# Patient Record
Sex: Female | Born: 1945
Health system: Southern US, Community
[De-identification: ages and names within clinical notes are randomized; demographics above are authoritative.]

## PROBLEM LIST (undated history)

## (undated) DIAGNOSIS — M1990 Unspecified osteoarthritis, unspecified site: Secondary | ICD-10-CM

## (undated) DIAGNOSIS — Z0389 Encounter for observation for other suspected diseases and conditions ruled out: Secondary | ICD-10-CM

## (undated) DIAGNOSIS — F32A Depression, unspecified: Secondary | ICD-10-CM

## (undated) DIAGNOSIS — F419 Anxiety disorder, unspecified: Secondary | ICD-10-CM

## (undated) DIAGNOSIS — K219 Gastro-esophageal reflux disease without esophagitis: Secondary | ICD-10-CM

## (undated) DIAGNOSIS — I10 Essential (primary) hypertension: Secondary | ICD-10-CM

## (undated) DIAGNOSIS — D649 Anemia, unspecified: Secondary | ICD-10-CM

## (undated) DIAGNOSIS — I428 Other cardiomyopathies: Secondary | ICD-10-CM

## (undated) DIAGNOSIS — IMO0002 Reserved for concepts with insufficient information to code with codable children: Secondary | ICD-10-CM

## (undated) DIAGNOSIS — N189 Chronic kidney disease, unspecified: Secondary | ICD-10-CM

## (undated) DIAGNOSIS — F329 Major depressive disorder, single episode, unspecified: Secondary | ICD-10-CM

## (undated) DIAGNOSIS — K635 Polyp of colon: Secondary | ICD-10-CM

## (undated) DIAGNOSIS — E669 Obesity, unspecified: Secondary | ICD-10-CM

## (undated) DIAGNOSIS — J439 Emphysema, unspecified: Secondary | ICD-10-CM

## (undated) DIAGNOSIS — E785 Hyperlipidemia, unspecified: Secondary | ICD-10-CM

## (undated) HISTORY — DX: Obesity, unspecified: E66.9

## (undated) HISTORY — DX: Polyp of colon: K63.5

## (undated) HISTORY — DX: Major depressive disorder, single episode, unspecified: F32.9

## (undated) HISTORY — PX: ABDOMINAL HYSTERECTOMY: SHX81

## (undated) HISTORY — DX: Hyperlipidemia, unspecified: E78.5

## (undated) HISTORY — DX: Other cardiomyopathies: I42.8

## (undated) HISTORY — DX: Essential (primary) hypertension: I10

## (undated) HISTORY — DX: Anxiety disorder, unspecified: F41.9

## (undated) HISTORY — PX: KIDNEY STONE SURGERY: SHX686

## (undated) HISTORY — PX: COLONOSCOPY: SHX174

## (undated) HISTORY — DX: Anemia, unspecified: D64.9

## (undated) HISTORY — DX: Unspecified osteoarthritis, unspecified site: M19.90

## (undated) HISTORY — DX: Encounter for observation for other suspected diseases and conditions ruled out: Z03.89

## (undated) HISTORY — PX: APPENDECTOMY: SHX54

## (undated) HISTORY — DX: Gastro-esophageal reflux disease without esophagitis: K21.9

## (undated) HISTORY — PX: CHOLECYSTECTOMY: SHX55

## (undated) HISTORY — DX: Depression, unspecified: F32.A

## (undated) HISTORY — PX: UPPER GASTROINTESTINAL ENDOSCOPY: SHX188

## (undated) HISTORY — PX: BREAST BIOPSY: SHX20

## (undated) HISTORY — DX: Reserved for concepts with insufficient information to code with codable children: IMO0002

---

## 1978-03-15 HISTORY — PX: GASTRIC BYPASS: SHX52

## 1998-09-03 ENCOUNTER — Other Ambulatory Visit: Admission: RE | Admit: 1998-09-03 | Discharge: 1998-09-03 | Payer: Self-pay | Admitting: Family Medicine

## 1998-12-05 ENCOUNTER — Encounter: Payer: Self-pay | Admitting: Cardiovascular Disease

## 1998-12-05 ENCOUNTER — Ambulatory Visit (HOSPITAL_COMMUNITY): Admission: RE | Admit: 1998-12-05 | Discharge: 1998-12-05 | Payer: Self-pay | Admitting: Cardiovascular Disease

## 1999-08-06 ENCOUNTER — Emergency Department (HOSPITAL_COMMUNITY): Admission: EM | Admit: 1999-08-06 | Discharge: 1999-08-06 | Payer: Self-pay

## 2001-01-13 ENCOUNTER — Encounter: Admission: RE | Admit: 2001-01-13 | Discharge: 2001-01-13 | Payer: Self-pay | Admitting: Family Medicine

## 2001-01-13 ENCOUNTER — Encounter: Payer: Self-pay | Admitting: Family Medicine

## 2001-01-30 ENCOUNTER — Other Ambulatory Visit: Admission: RE | Admit: 2001-01-30 | Discharge: 2001-01-30 | Payer: Self-pay | Admitting: Family Medicine

## 2002-01-15 ENCOUNTER — Encounter: Payer: Self-pay | Admitting: Family Medicine

## 2002-01-15 ENCOUNTER — Encounter: Admission: RE | Admit: 2002-01-15 | Discharge: 2002-01-15 | Payer: Self-pay | Admitting: Family Medicine

## 2002-07-13 ENCOUNTER — Encounter: Payer: Self-pay | Admitting: Family Medicine

## 2002-07-13 ENCOUNTER — Encounter: Admission: RE | Admit: 2002-07-13 | Discharge: 2002-07-13 | Payer: Self-pay | Admitting: Family Medicine

## 2003-08-22 ENCOUNTER — Encounter: Admission: RE | Admit: 2003-08-22 | Discharge: 2003-08-22 | Payer: Self-pay | Admitting: Family Medicine

## 2003-10-31 ENCOUNTER — Encounter: Payer: Self-pay | Admitting: Family Medicine

## 2004-06-22 ENCOUNTER — Encounter: Payer: Self-pay | Admitting: Family Medicine

## 2004-06-24 ENCOUNTER — Encounter: Payer: Self-pay | Admitting: Family Medicine

## 2004-09-01 ENCOUNTER — Encounter: Admission: RE | Admit: 2004-09-01 | Discharge: 2004-09-01 | Payer: Self-pay | Admitting: Family Medicine

## 2005-01-25 ENCOUNTER — Encounter: Admission: RE | Admit: 2005-01-25 | Discharge: 2005-01-25 | Payer: Self-pay | Admitting: Orthopedic Surgery

## 2005-02-08 ENCOUNTER — Encounter: Admission: RE | Admit: 2005-02-08 | Discharge: 2005-02-08 | Payer: Self-pay | Admitting: Orthopedic Surgery

## 2005-07-20 ENCOUNTER — Encounter: Admission: RE | Admit: 2005-07-20 | Discharge: 2005-07-20 | Payer: Self-pay | Admitting: Family Medicine

## 2005-09-02 ENCOUNTER — Encounter: Admission: RE | Admit: 2005-09-02 | Discharge: 2005-09-02 | Payer: Self-pay | Admitting: Family Medicine

## 2007-04-17 ENCOUNTER — Encounter: Payer: Self-pay | Admitting: Family Medicine

## 2007-04-18 ENCOUNTER — Encounter (INDEPENDENT_AMBULATORY_CARE_PROVIDER_SITE_OTHER): Payer: Self-pay | Admitting: *Deleted

## 2007-04-18 LAB — CONVERTED CEMR LAB
ALT: 15 units/L
CO2, serum: 26 mmol/L
Chloride, Serum: 101 mmol/L
Cholesterol: 233 mg/dL
HDL: 72 mg/dL
LDL Cholesterol: 107 mg/dL
Potassium, serum: 4.9 mmol/L
RBC count: 4.59 10*6/uL
TSH: 3.906 microintl units/mL
Total Bilirubin: 0.3 mg/dL
Triglycerides: 269 mg/dL
WBC, blood: 6.1 10*3/uL

## 2008-05-01 ENCOUNTER — Encounter (INDEPENDENT_AMBULATORY_CARE_PROVIDER_SITE_OTHER): Payer: Self-pay | Admitting: *Deleted

## 2008-05-28 ENCOUNTER — Ambulatory Visit: Payer: Self-pay | Admitting: Family Medicine

## 2008-05-28 DIAGNOSIS — F329 Major depressive disorder, single episode, unspecified: Secondary | ICD-10-CM

## 2008-05-28 DIAGNOSIS — E1159 Type 2 diabetes mellitus with other circulatory complications: Secondary | ICD-10-CM | POA: Insufficient documentation

## 2008-05-28 DIAGNOSIS — F3289 Other specified depressive episodes: Secondary | ICD-10-CM | POA: Insufficient documentation

## 2008-05-28 DIAGNOSIS — I10 Essential (primary) hypertension: Secondary | ICD-10-CM | POA: Insufficient documentation

## 2008-05-28 DIAGNOSIS — M1990 Unspecified osteoarthritis, unspecified site: Secondary | ICD-10-CM | POA: Insufficient documentation

## 2008-05-29 ENCOUNTER — Encounter (INDEPENDENT_AMBULATORY_CARE_PROVIDER_SITE_OTHER): Payer: Self-pay | Admitting: *Deleted

## 2008-05-31 ENCOUNTER — Telehealth (INDEPENDENT_AMBULATORY_CARE_PROVIDER_SITE_OTHER): Payer: Self-pay | Admitting: *Deleted

## 2008-05-31 LAB — CONVERTED CEMR LAB
ALT: 16 units/L (ref 0–35)
Alkaline Phosphatase: 74 units/L (ref 39–117)
BUN: 10 mg/dL (ref 6–23)
Basophils Absolute: 0.1 10*3/uL (ref 0.0–0.1)
Basophils Relative: 1.1 % (ref 0.0–3.0)
Eosinophils Relative: 6.1 % — ABNORMAL HIGH (ref 0.0–5.0)
HDL: 59.6 mg/dL (ref >39.00)
Hemoglobin: 14.6 g/dL (ref 12.0–15.0)
Lymphs Abs: 1.8 10*3/uL (ref 0.7–4.0)
MCHC: 34.7 g/dL (ref 30.0–36.0)
Monocytes Absolute: 0.5 10*3/uL (ref 0.1–1.0)
Monocytes Relative: 9.7 % (ref 3.0–12.0)
Neutrophils Relative %: 45.8 % (ref 43.0–77.0)
Platelets: 198 10*3/uL (ref 150.0–400.0)
RDW: 12.5 % (ref 11.5–14.6)
Sodium: 142 meq/L (ref 135–145)
Triglycerides: 205 mg/dL — ABNORMAL HIGH (ref 0.0–149.0)
WBC: 4.8 10*3/uL (ref 4.5–10.5)

## 2008-06-28 ENCOUNTER — Encounter: Payer: Self-pay | Admitting: Family Medicine

## 2008-07-12 ENCOUNTER — Ambulatory Visit: Payer: Self-pay | Admitting: Family Medicine

## 2008-07-12 DIAGNOSIS — E781 Pure hyperglyceridemia: Secondary | ICD-10-CM | POA: Insufficient documentation

## 2008-07-12 HISTORY — DX: Pure hyperglyceridemia: E78.1

## 2008-07-15 ENCOUNTER — Encounter (INDEPENDENT_AMBULATORY_CARE_PROVIDER_SITE_OTHER): Payer: Self-pay | Admitting: *Deleted

## 2008-07-15 ENCOUNTER — Encounter: Admission: RE | Admit: 2008-07-15 | Discharge: 2008-07-15 | Payer: Self-pay | Admitting: Family Medicine

## 2008-07-15 LAB — CONVERTED CEMR LAB
ALT: 21 units/L (ref 0–35)
Alkaline Phosphatase: 65 units/L (ref 39–117)
Total Bilirubin: 0.6 mg/dL (ref 0.3–1.2)

## 2008-07-19 ENCOUNTER — Encounter: Payer: Self-pay | Admitting: Family Medicine

## 2008-07-19 ENCOUNTER — Encounter: Admission: RE | Admit: 2008-07-19 | Discharge: 2008-07-19 | Payer: Self-pay | Admitting: Family Medicine

## 2008-07-19 ENCOUNTER — Telehealth (INDEPENDENT_AMBULATORY_CARE_PROVIDER_SITE_OTHER): Payer: Self-pay | Admitting: *Deleted

## 2008-07-22 ENCOUNTER — Encounter (INDEPENDENT_AMBULATORY_CARE_PROVIDER_SITE_OTHER): Payer: Self-pay | Admitting: *Deleted

## 2008-09-04 ENCOUNTER — Encounter (INDEPENDENT_AMBULATORY_CARE_PROVIDER_SITE_OTHER): Payer: Self-pay | Admitting: *Deleted

## 2009-03-02 ENCOUNTER — Emergency Department (HOSPITAL_COMMUNITY): Admission: EM | Admit: 2009-03-02 | Discharge: 2009-03-02 | Payer: Self-pay | Admitting: Emergency Medicine

## 2009-03-16 ENCOUNTER — Emergency Department (HOSPITAL_COMMUNITY): Admission: EM | Admit: 2009-03-16 | Discharge: 2009-03-16 | Payer: Self-pay | Admitting: Family Medicine

## 2010-11-09 ENCOUNTER — Encounter: Payer: Self-pay | Admitting: Internal Medicine

## 2011-08-23 ENCOUNTER — Encounter: Payer: Self-pay | Admitting: Family Medicine

## 2011-08-23 ENCOUNTER — Ambulatory Visit (INDEPENDENT_AMBULATORY_CARE_PROVIDER_SITE_OTHER)
Admission: RE | Admit: 2011-08-23 | Discharge: 2011-08-23 | Disposition: A | Discharge status: Home / Self Care | Payer: Medicare Other | Source: Ambulatory Visit | Attending: Family Medicine | Admitting: Family Medicine

## 2011-08-23 ENCOUNTER — Ambulatory Visit (INDEPENDENT_AMBULATORY_CARE_PROVIDER_SITE_OTHER): Payer: Medicare Other | Admitting: Family Medicine

## 2011-08-23 VITALS — BP 125/82 | HR 75 | Temp 97.4°F | Ht 65.0 in | Wt 243.0 lb

## 2011-08-23 DIAGNOSIS — F329 Major depressive disorder, single episode, unspecified: Secondary | ICD-10-CM

## 2011-08-23 DIAGNOSIS — Z0000 Encounter for general adult medical examination without abnormal findings: Secondary | ICD-10-CM

## 2011-08-23 DIAGNOSIS — R0602 Shortness of breath: Secondary | ICD-10-CM | POA: Insufficient documentation

## 2011-08-23 DIAGNOSIS — I10 Essential (primary) hypertension: Secondary | ICD-10-CM

## 2011-08-23 DIAGNOSIS — Z780 Asymptomatic menopausal state: Secondary | ICD-10-CM

## 2011-08-23 DIAGNOSIS — F3289 Other specified depressive episodes: Secondary | ICD-10-CM

## 2011-08-23 DIAGNOSIS — Z1231 Encounter for screening mammogram for malignant neoplasm of breast: Secondary | ICD-10-CM

## 2011-08-23 DIAGNOSIS — M47814 Spondylosis without myelopathy or radiculopathy, thoracic region: Secondary | ICD-10-CM | POA: Diagnosis not present

## 2011-08-23 HISTORY — DX: Shortness of breath: R06.02

## 2011-08-23 LAB — LIPID PANEL
Cholesterol: 244 mg/dL — ABNORMAL HIGH (ref 0–200)
HDL: 58.8 mg/dL (ref >39.00)

## 2011-08-23 LAB — CBC WITH DIFFERENTIAL/PLATELET
Basophils Absolute: 0 10*3/uL (ref 0.0–0.1)
Eosinophils Absolute: 0.2 10*3/uL (ref 0.0–0.7)
Lymphs Abs: 1.8 10*3/uL (ref 0.7–4.0)
Neutrophils Relative %: 54.3 % (ref 43.0–77.0)
Platelets: 154 10*3/uL (ref 150.0–400.0)
WBC: 5.4 10*3/uL (ref 4.5–10.5)

## 2011-08-23 LAB — TSH: TSH: 2.12 u[IU]/mL (ref 0.35–5.50)

## 2011-08-23 LAB — HEPATIC FUNCTION PANEL
ALT: 19 U/L (ref 0–35)
AST: 22 U/L (ref 0–37)
Bilirubin, Direct: 0.1 mg/dL (ref 0.0–0.3)
Total Protein: 7.1 g/dL (ref 6.0–8.3)

## 2011-08-23 LAB — BASIC METABOLIC PANEL
CO2: 27 mEq/L (ref 19–32)
Chloride: 103 mEq/L (ref 96–112)
GFR: 128.63 mL/min (ref >60.00)
Potassium: 3.9 mEq/L (ref 3.5–5.1)

## 2011-08-23 MED ORDER — LISINOPRIL-HYDROCHLOROTHIAZIDE 10-12.5 MG PO TABS: 1.0000 | ORAL_TABLET | Freq: Every day | ORAL | Status: DC

## 2011-08-23 NOTE — Patient Instructions (Addendum)
Follow up in 1 month to recheck blood pressure Go to [facility] and get your chest xray We'll notify you of your lab results Someone will call you with your mammo and bone density appts STOP the Bisoprolol START the Lisinopril HCT combo pill daily Call with any questions or concerns Welcome Back! Happy [birth year] Birthday!!!

## 2011-08-23 NOTE — Progress Notes (Signed)
Subjective:    Patient ID: April Combs, female    DOB: 1945-07-31, 66 y.o.   MRN: [id number]  HPI Here today for CPE.  Risk Factors: HTN- chronic problem, previously on Toprol, Lotrel, HCTZ.  Has been off these x2+ yrs.  Currently on Bisoprolol.  Checked BP when she had a HA and it was 156/102.  BP tends to be labile.  No CP, + SOB.  Intermittent edema.  + HAs.  No visual changes. Depression- chronic problem.  Previously on Wellbutrin, has been off all meds x2+ yrs.  Will have 'crying spells'.  Poor sleep at night, taking Tylenol PM nightly.  Pt is withdrawing from things she used to enjoy. SOB- sxs have been 'real bad for the last few months'.  Has gained 'a lot of weight' since retiring.  Difficulty going up stairs, walking any distance.  Former smoker.  Denies wheezing.  Will need to rest frequently.  Has appt w/ cardiology next week (6/21) Physical Activity: limited Fall Risk: low, steady on feet Depression: see above. Hearing: normal to conversational tones and whispered voice ADL's: independent Cognitive: normal linear thought process, memory and attention intact Home Safety: safe at home Height, Weight, BMI, Visual Acuity: see vitals, vision corrected to 20/20 w/ glasses Counseling:  UTD on colonoscopy, overdue on mammo, no need for pap due to hysterectomy.  Overdue on DEXA Labs Ordered: See A&P Care Plan: See A&P    Review of Systems Patient reports no vision/ hearing changes, adenopathy,fever, weight change,  persistant/recurrent hoarseness , swallowing issues, chest pain, palpitations, persistant/recurrent cough, hemoptysis, gastrointestinal bleeding (melena, rectal bleeding), abdominal pain, bowel changes, GU symptoms (dysuria, hematuria, incontinence), Gyn symptoms (abnormal  bleeding, pain),  syncope, focal weakness, memory loss, numbness & tingling, skin/hair/nail changes, abnormal bruising or bleeding.  + GERD- taking OTC acid reducer at [employer] w/ good results       Objective:   Physical Exam  General Appearance:    Alert, cooperative, no distress, obese  Head:    Normocephalic, without obvious abnormality, atraumatic  Eyes:    PERRL, conjunctiva/corneas clear, EOM's intact, fundi    benign, both eyes  Ears:    Normal TM's and external ear canals, both ears  Nose:   Nares normal, septum midline, mucosa normal, no drainage    or sinus tenderness  Throat:   Lips, mucosa, and tongue normal; teeth and gums normal  Neck:   Supple, symmetrical, trachea midline, no adenopathy;    Thyroid: no enlargement/tenderness/nodules  Back:     Symmetric, no curvature, ROM normal, no CVA tenderness  Lungs:     Clear to auscultation bilaterally, respirations unlabored  Chest Wall:    No tenderness or deformity   Heart:    Regular rate and rhythm, S1 and S2 normal, no murmur, rub   or gallop  Breast Exam:    No tenderness, masses, or nipple abnormality  Abdomen:     Soft, non-tender, bowel sounds active all four quadrants,    no masses, no organomegaly  Genitalia:    Deferred  Rectal:    Extremities:   Extremities normal, atraumatic, no cyanosis or edema  Pulses:   2+ and symmetric all extremities  Skin:   Skin color, texture, turgor normal, no rashes or lesions  Lymph nodes:   Cervical, supraclavicular, and axillary nodes normal  Neurologic:   CNII-XII intact, normal strength, sensation and reflexes    throughout          Assessment & Plan:

## 2011-08-24 MED ORDER — ATORVASTATIN CALCIUM 20 MG PO TABS: 20.0000 mg | ORAL_TABLET | Freq: Every day | ORAL | Status: DC

## 2011-09-03 DIAGNOSIS — R0602 Shortness of breath: Secondary | ICD-10-CM | POA: Diagnosis not present

## 2011-09-03 DIAGNOSIS — R079 Chest pain, unspecified: Secondary | ICD-10-CM | POA: Diagnosis not present

## 2011-09-03 DIAGNOSIS — I10 Essential (primary) hypertension: Secondary | ICD-10-CM | POA: Diagnosis not present

## 2011-09-05 NOTE — Assessment & Plan Note (Signed)
Chronic problem.  BP is labile and pt doesn't like the way she is feeling on beta blocker.  Will stop beta blocker and start Lisinopril HCT and recheck BP and BMP in 1 month.  Pt expressed understanding and is in agreement w/ plan.

## 2011-09-05 NOTE — Assessment & Plan Note (Signed)
New.  Pt UTD on colonoscopy.  Will refer for mammo and DEXA.  Check labs.  Start/adjust meds prn.  Anticipatory guidance provided.

## 2011-09-05 NOTE — Assessment & Plan Note (Signed)
Recurrent problem.  Pt previously medicated but stopped this when she lost her insurance.  Plan will be to restart meds in 1 month at her BP f/u since we don't want to start too many new meds at once.  Encouraged her to consider counseling to deal w/ her stressors- pt to consider this.

## 2011-09-05 NOTE — Assessment & Plan Note (Signed)
New.  Pt has appt upcoming w/ cards.  Get CXR to assess for pulmonary cause.  Will continue to follow if cards doesn't find cause.

## 2011-09-13 HISTORY — PX: OTHER SURGICAL HISTORY: SHX169

## 2011-09-13 HISTORY — PX: DOPPLER ECHOCARDIOGRAPHY: SHX263

## 2011-09-20 DIAGNOSIS — R0602 Shortness of breath: Secondary | ICD-10-CM | POA: Diagnosis not present

## 2011-09-20 DIAGNOSIS — I059 Rheumatic mitral valve disease, unspecified: Secondary | ICD-10-CM | POA: Diagnosis not present

## 2011-09-22 ENCOUNTER — Other Ambulatory Visit: Payer: Medicare Other

## 2011-09-22 ENCOUNTER — Ambulatory Visit: Payer: Medicare Other

## 2011-09-22 ENCOUNTER — Ambulatory Visit: Payer: Medicare Other | Admitting: Family Medicine

## 2011-09-29 ENCOUNTER — Ambulatory Visit (INDEPENDENT_AMBULATORY_CARE_PROVIDER_SITE_OTHER): Payer: Medicare Other | Admitting: Family Medicine

## 2011-09-29 ENCOUNTER — Encounter: Payer: Self-pay | Admitting: Family Medicine

## 2011-09-29 VITALS — BP 134/84 | HR 65 | Temp 97.6°F | Ht 65.5 in | Wt 245.4 lb

## 2011-09-29 DIAGNOSIS — B369 Superficial mycosis, unspecified: Secondary | ICD-10-CM

## 2011-09-29 DIAGNOSIS — E1169 Type 2 diabetes mellitus with other specified complication: Secondary | ICD-10-CM | POA: Insufficient documentation

## 2011-09-29 DIAGNOSIS — I499 Cardiac arrhythmia, unspecified: Secondary | ICD-10-CM

## 2011-09-29 DIAGNOSIS — I10 Essential (primary) hypertension: Secondary | ICD-10-CM | POA: Diagnosis not present

## 2011-09-29 DIAGNOSIS — E785 Hyperlipidemia, unspecified: Secondary | ICD-10-CM

## 2011-09-29 HISTORY — DX: Cardiac arrhythmia, unspecified: I49.9

## 2011-09-29 HISTORY — DX: Superficial mycosis, unspecified: B36.9

## 2011-09-29 HISTORY — DX: Hyperlipidemia, unspecified: E78.5

## 2011-09-29 LAB — HEPATIC FUNCTION PANEL
Alkaline Phosphatase: 63 U/L (ref 39–117)
Total Protein: 7.1 g/dL (ref 6.0–8.3)

## 2011-09-29 MED ORDER — CLOTRIMAZOLE-BETAMETHASONE 1-0.05 % EX CREA: TOPICAL_CREAM | Freq: Two times a day (BID) | CUTANEOUS | Status: AC

## 2011-09-29 NOTE — Progress Notes (Signed)
  Subjective:    Patient ID: April Combs, female    DOB: 09/14/1945, 65 y.o.   MRN: [id number]  HPI HTN- chronic problem.  Pt has seen cards in the interim.  Cards increased Lisinopril HCT from 10-12.5 to 20/25mg .  Also started her on Metoprolol 25 mg- but she has not started this.  BP remains labile.  Pt reports feeling 'pretty good'.  No CP, SOB, HAs, visual changes, edema.  Heat rash- under L breast.  First started 1 week ago.  + itching and burning.  Using powder w/out relief.  Hyperlipidemia- chronic problem.  Pt started on Lipitor at last OV.  Denies abd pain, N/V, myalgias.   Review of Systems For ROS see HPI     Objective:   Physical Exam  Vitals reviewed. Constitutional: She is oriented to person, place, and time. She appears well-developed and well-nourished. No distress.  HENT:  Head: Normocephalic and atraumatic.  Eyes: Conjunctivae and EOM are normal. Pupils are equal, round, and reactive to light.  Neck: Normal range of motion. Neck supple. No thyromegaly present.  Cardiovascular: Normal rate, normal heart sounds and intact distal pulses.   No murmur heard.      Irregularly irregular S1/S2  Pulmonary/Chest: Effort normal and breath sounds normal. No respiratory distress.  Abdominal: Soft. She exhibits no distension. There is no tenderness.  Musculoskeletal: She exhibits no edema.  Lymphadenopathy:    She has no cervical adenopathy.  Neurological: She is alert and oriented to person, place, and time.  Skin: Skin is warm and dry. Rash (erythematous, peeling area under L breast w/ sattelite lesions) noted.  Psychiatric: She has a normal mood and affect. Her behavior is normal.          Assessment & Plan:

## 2011-09-29 NOTE — Assessment & Plan Note (Signed)
New on PE.  EKG shows multiple PVCs.  Pt is asymptomatic.  Will fax EKG to cards and determine the next steps.

## 2011-09-29 NOTE — Assessment & Plan Note (Signed)
New.  Started on Lipitor at last visit.  Due for repeat LFTs today.  Tolerating meds w/out difficulty.

## 2011-09-29 NOTE — Assessment & Plan Note (Signed)
Chronic problem.  meds recently adjusted by cards.  Currently asymptomatic but pulse irregularly irregular.

## 2011-09-29 NOTE — Patient Instructions (Addendum)
Follow up in 6 months to recheck cholesterol I'm faxing the EKG to cards so someone will call you with the next steps We'll notify you of your lab results Use the Lotrisone cream under the breast twice daily Call with any questions or concerns Enjoy the rest of your summer!

## 2011-09-29 NOTE — Assessment & Plan Note (Signed)
New.  Under L breast.  Start Lotrisone cream BID.  Continue to keep area clean and dry.

## 2011-10-07 ENCOUNTER — Encounter: Payer: Self-pay | Admitting: Family Medicine

## 2011-10-20 ENCOUNTER — Ambulatory Visit
Admission: RE | Admit: 2011-10-20 | Discharge: 2011-10-20 | Disposition: A | Discharge status: Home / Self Care | Payer: Medicare Other | Source: Ambulatory Visit | Attending: Family Medicine | Admitting: Family Medicine

## 2011-10-20 ENCOUNTER — Other Ambulatory Visit: Payer: Medicare Other

## 2011-10-20 DIAGNOSIS — Z1231 Encounter for screening mammogram for malignant neoplasm of breast: Secondary | ICD-10-CM

## 2011-11-03 DIAGNOSIS — I10 Essential (primary) hypertension: Secondary | ICD-10-CM | POA: Diagnosis not present

## 2011-11-03 DIAGNOSIS — E65 Localized adiposity: Secondary | ICD-10-CM | POA: Diagnosis not present

## 2011-11-03 DIAGNOSIS — R0602 Shortness of breath: Secondary | ICD-10-CM | POA: Diagnosis not present

## 2011-11-12 DIAGNOSIS — E782 Mixed hyperlipidemia: Secondary | ICD-10-CM | POA: Diagnosis not present

## 2011-11-12 DIAGNOSIS — I10 Essential (primary) hypertension: Secondary | ICD-10-CM | POA: Diagnosis not present

## 2011-11-12 DIAGNOSIS — E669 Obesity, unspecified: Secondary | ICD-10-CM | POA: Diagnosis not present

## 2011-11-12 DIAGNOSIS — R079 Chest pain, unspecified: Secondary | ICD-10-CM | POA: Diagnosis not present

## 2011-11-19 DIAGNOSIS — IMO0002 Reserved for concepts with insufficient information to code with codable children: Secondary | ICD-10-CM | POA: Diagnosis not present

## 2011-11-30 ENCOUNTER — Encounter: Payer: Self-pay | Admitting: Internal Medicine

## 2011-12-02 ENCOUNTER — Encounter: Payer: Self-pay | Admitting: Internal Medicine

## 2011-12-22 ENCOUNTER — Encounter: Payer: Self-pay | Admitting: Family Medicine

## 2011-12-22 ENCOUNTER — Ambulatory Visit (INDEPENDENT_AMBULATORY_CARE_PROVIDER_SITE_OTHER): Payer: Medicare Other | Admitting: Family Medicine

## 2011-12-22 VITALS — BP 124/77 | HR 82 | Temp 97.6°F | Ht 65.0 in | Wt 245.5 lb

## 2011-12-22 DIAGNOSIS — IMO0002 Reserved for concepts with insufficient information to code with codable children: Secondary | ICD-10-CM | POA: Insufficient documentation

## 2011-12-22 DIAGNOSIS — H8110 Benign paroxysmal vertigo, unspecified ear: Secondary | ICD-10-CM | POA: Diagnosis not present

## 2011-12-22 MED ORDER — MECLIZINE HCL 50 MG PO TABS: 50.0000 mg | ORAL_TABLET | Freq: Three times a day (TID) | ORAL | Status: DC | PRN

## 2011-12-22 NOTE — Progress Notes (Signed)
  Subjective:    Patient ID: April Combs, female    DOB: 07/10/1945, 66 y.o.   MRN: [id number]  HPI Dizziness- has talked w/ cards about this and they feel that it is vertigo related and not BP related.  sxs are intermittent.  Will 'turn over in bed at night and things will spin'.  Will occur w/ turning head.  Will also occur w/ rapid position change.  No nausea.  + chronic HAs- nothing new or different.  sxs started a 'couple of months ago'.  Reports sxs have worsened recently.  + facial pressure, L>R.  No nasal congestion.  No ear pain.  Has fallen.   Review of Systems For ROS see HPI     Objective:   Physical Exam  Vitals reviewed. Constitutional: She is oriented to person, place, and time. She appears well-developed and well-nourished. No distress.  HENT:  Head: Normocephalic and atraumatic.       TMs normal bilaterally No TTP over sinuses  Eyes: Conjunctivae normal and EOM are normal. Pupils are equal, round, and reactive to light.       + horizontal nystagmus  Neck: Normal range of motion. Neck supple.  Cardiovascular: Normal rate, regular rhythm and normal heart sounds.   Pulmonary/Chest: Effort normal and breath sounds normal. No respiratory distress. She has no wheezes. She has no rales.  Lymphadenopathy:    She has no cervical adenopathy.  Neurological: She is alert and oriented to person, place, and time. She has normal reflexes. No cranial nerve deficit. Coordination (normal finger to nose, rapid alternating movements intact) normal.       + Dix-Hallpike          Assessment & Plan:

## 2011-12-22 NOTE — Assessment & Plan Note (Signed)
New.  Pt's sxs and PE consistent w/ BPV.  Start meclizine prn.  Reviewed supportive care and red flags that should prompt return.  Pt expressed understanding and is in agreement w/ plan.

## 2011-12-22 NOTE — Patient Instructions (Addendum)
This is called positional vertigo Use the Meclizine as needed If no improvement by the first of the week, call and we'll get you into physical therapy Continue to drink plenty of fluids Change positions slowly Call with any questions or concerns Hang in there!!

## 2012-01-04 ENCOUNTER — Other Ambulatory Visit: Payer: Self-pay | Admitting: Family Medicine

## 2012-01-04 MED ORDER — ATORVASTATIN CALCIUM 20 MG PO TABS: 20.0000 mg | ORAL_TABLET | Freq: Every day | ORAL | Status: DC

## 2012-01-04 NOTE — Telephone Encounter (Signed)
refill Atorvastatin 20mg  tab #30 take 1 tablet by mouth daily -- last fill 9.17.13--last ov 10.9.13 acute

## 2012-01-04 NOTE — Telephone Encounter (Signed)
rx sent to pharmacy by e-script To last pt til upcoming OV

## 2012-01-12 ENCOUNTER — Ambulatory Visit (AMBULATORY_SURGERY_CENTER): Payer: Medicare Other | Admitting: *Deleted

## 2012-01-12 VITALS — Ht 65.0 in | Wt 240.2 lb

## 2012-01-12 DIAGNOSIS — Z86010 Personal history of colonic polyps: Secondary | ICD-10-CM

## 2012-01-12 DIAGNOSIS — Z1211 Encounter for screening for malignant neoplasm of colon: Secondary | ICD-10-CM | POA: Diagnosis not present

## 2012-01-12 MED ORDER — MOVIPREP 100 G PO SOLR: ORAL | Status: DC

## 2012-01-26 ENCOUNTER — Ambulatory Visit (AMBULATORY_SURGERY_CENTER): Payer: Medicare Other | Admitting: Internal Medicine

## 2012-01-26 ENCOUNTER — Encounter: Payer: Self-pay | Admitting: Internal Medicine

## 2012-01-26 VITALS — BP 137/79 | HR 78 | Temp 97.3°F | Resp 16 | Ht 65.0 in | Wt 240.0 lb

## 2012-01-26 DIAGNOSIS — E785 Hyperlipidemia, unspecified: Secondary | ICD-10-CM | POA: Diagnosis not present

## 2012-01-26 DIAGNOSIS — Z1211 Encounter for screening for malignant neoplasm of colon: Secondary | ICD-10-CM

## 2012-01-26 DIAGNOSIS — Z86010 Personal history of colonic polyps: Secondary | ICD-10-CM

## 2012-01-26 DIAGNOSIS — D126 Benign neoplasm of colon, unspecified: Secondary | ICD-10-CM

## 2012-01-26 DIAGNOSIS — R0602 Shortness of breath: Secondary | ICD-10-CM | POA: Diagnosis not present

## 2012-01-26 DIAGNOSIS — F329 Major depressive disorder, single episode, unspecified: Secondary | ICD-10-CM | POA: Diagnosis not present

## 2012-01-26 DIAGNOSIS — I10 Essential (primary) hypertension: Secondary | ICD-10-CM | POA: Diagnosis not present

## 2012-01-26 MED ORDER — DICYCLOMINE HCL 10 MG PO CAPS: 10.0000 mg | ORAL_CAPSULE | Freq: Four times a day (QID) | ORAL | Status: DC | PRN

## 2012-01-26 MED ORDER — SODIUM CHLORIDE 0.9 % IV SOLN: 500.0000 mL | INTRAVENOUS | Status: DC

## 2012-01-26 NOTE — Progress Notes (Signed)
Patient did not experience any of the following events: a burn prior to discharge; a fall within the facility; wrong site/side/patient/procedure/implant event; or a hospital transfer or hospital admission upon discharge from the facility. (G8907) Patient did not have preoperative order for IV antibiotic SSI prophylaxis. (G8918)  

## 2012-01-26 NOTE — Op Note (Addendum)
[facility] [address] [place], [address]   COLONOSCOPY PROCEDURE REPORT  PATIENT: April, Combs  MR#: [id number] BIRTHDATE: 25-Nov-1945 , 66  yrs. old GENDER: Female ENDOSCOPIST: Hart Carwin, MD REFERRED BY:  recall colon PROCEDURE DATE:  01/26/2012 PROCEDURE:   Colonoscopy with snare polypectomy ASA CLASS:   Class II INDICATIONS:patient's personal history of colon polyps and colonoscopy 1988 and 1999- hyperplastic polyps, last colon 2005- infectious colitis. MEDICATIONS: MAC sedation, administered by CRNA and Propofol (Diprivan) 650 mg IV  DESCRIPTION OF PROCEDURE:   After the risks and benefits and of the procedure were explained, informed consent was obtained.  A digital rectal exam revealed no abnormalities of the rectum.    The LB CF-[id number]  endoscope was introduced through the anus and advanced to the cecum, which was identified by both the appendix and ileocecal valve .  The quality of the prep was good, using MoviPrep .  The instrument was then slowly withdrawn as the colon was fully examined.     COLON FINDINGS: Three smooth sessile polyps ranging between 3-87mm in size were found in the sigmoid colon.  A polypectomy was performed using snare cautery and with cold forceps.  The resection was complete and the polyp tissue was completely retrieved. Retroflexed views revealed no abnormalities.     The scope was then withdrawn from the patient and the procedure completed.  COMPLICATIONS: There were no complications. ENDOSCOPIC IMPRESSION: Three sessile polyps ranging between 3-37mm in size were found in the sigmoid colon; polypectomy was performed using snare cautery and with cold forceps long redundant colon with spasm- difficult exam  RECOMMENDATIONS: 1.  Await pathology results 2.  High fiber diet   REPEAT EXAM: In 5-7 year(s)  for Colonoscopy.depending on polyp pype   cc:Dr  Tabori  _______________________________ eSigned:  Hart Carwin, MD 01/26/2012 9:00 AM     PATIENT NAME:  April, Combs MR#: [id number]

## 2012-01-26 NOTE — Patient Instructions (Addendum)
Findings: Polyps Recommendations: repeat colonoscopy in 5-7 years depending on biopsies.  High fiber diet  YOU HAD AN ENDOSCOPIC PROCEDURE TODAY AT THE [facility]: Refer to the procedure report that was given to you for any specific questions about what was found during the examination.  If the procedure report does not answer your questions, please call your gastroenterologist to clarify.  If you requested that your care partner not be given the details of your procedure findings, then the procedure report has been included in a sealed envelope for you to review at your convenience later.  YOU SHOULD EXPECT: Some feelings of bloating in the abdomen. Passage of more gas than usual.  Walking can help get rid of the air that was put into your GI tract during the procedure and reduce the bloating. If you had a lower endoscopy (such as a colonoscopy or flexible sigmoidoscopy) you may notice spotting of blood in your stool or on the toilet paper. If you underwent a bowel prep for your procedure, then you may not have a normal bowel movement for a few days.  DIET: Your first meal following the procedure should be a light meal and then it is ok to progress to your normal diet.  A half-sandwich or bowl of soup is an example of a good first meal.  Heavy or fried foods are harder to digest and may make you feel nauseous or bloated.  Likewise meals heavy in dairy and vegetables can cause extra gas to form and this can also increase the bloating.  Drink plenty of fluids but you should avoid alcoholic beverages for 24 hours.  ACTIVITY: Your care partner should take you home directly after the procedure.  You should plan to take it easy, moving slowly for the rest of the day.  You can resume normal activity the day after the procedure however you should NOT DRIVE or use heavy machinery for 24 hours (because of the sedation medicines used during the test).    SYMPTOMS TO REPORT IMMEDIATELY: A  gastroenterologist can be reached at any hour.  During normal business hours, 8:30 AM to 5:00 PM Monday through Friday, call [phone].  After hours and on weekends, please call the GI answering service at [phone] who will take a message and have the physician on call contact you.   Following lower endoscopy (colonoscopy or flexible sigmoidoscopy):  Excessive amounts of blood in the stool  Significant tenderness or worsening of abdominal pains  Swelling of the abdomen that is new, acute  Fever of 100F or higher  Following upper endoscopy (EGD)  Vomiting of blood or coffee ground material  New chest pain or pain under the shoulder blades  Painful or persistently difficult swallowing  New shortness of breath  Fever of 100F or higher  Black, tarry-looking stools  FOLLOW UP: If any biopsies were taken you will be contacted by phone or by letter within the next 1-3 weeks.  Call your gastroenterologist if you have not heard about the biopsies in 3 weeks.  Our staff will call the home number listed on your records the next business day following your procedure to check on you and address any questions or concerns that you may have at that time regarding the information given to you following your procedure. This is a courtesy call and so if there is no answer at the home number and we have not heard from you through the emergency physician on call, we will assume that  you have returned to your regular daily activities without incident.  SIGNATURES/CONFIDENTIALITY: You and/or your care partner have signed paperwork which will be entered into your electronic medical record.  These signatures attest to the fact that that the information above on your After Visit Summary has been reviewed and is understood.  Full responsibility of the confidentiality of this discharge information lies with you and/or your care-partner.   Patient did not experience any of the following events: a burn prior  to discharge; a fall within the facility; wrong site/side/patient/procedure/implant event; or a hospital transfer or hospital admission upon discharge from the facility. [phone])

## 2012-01-27 ENCOUNTER — Telehealth: Payer: Self-pay | Admitting: *Deleted

## 2012-01-27 NOTE — Telephone Encounter (Signed)
  Follow up Call-  Call back number 01/26/2012  Post procedure Call Back phone  # [phone]  Permission to leave phone message Yes     Patient questions:  Do you have a fever, pain , or abdominal swelling? no Pain Score  0 *  Have you tolerated food without any problems? yes  Have you been able to return to your normal activities? yes  Do you have any questions about your discharge instructions: Diet   no Medications  no Follow up visit  no  Do you have questions or concerns about your Care? no  Actions: * If pain score is 4 or above: No action needed, pain <4.

## 2012-01-31 ENCOUNTER — Encounter: Payer: Self-pay | Admitting: Internal Medicine

## 2012-02-07 DIAGNOSIS — E782 Mixed hyperlipidemia: Secondary | ICD-10-CM | POA: Diagnosis not present

## 2012-02-07 DIAGNOSIS — I10 Essential (primary) hypertension: Secondary | ICD-10-CM | POA: Diagnosis not present

## 2012-02-07 DIAGNOSIS — R0602 Shortness of breath: Secondary | ICD-10-CM | POA: Diagnosis not present

## 2012-03-15 LAB — HM DEXA SCAN: HM DEXA SCAN: NORMAL

## 2012-03-20 ENCOUNTER — Ambulatory Visit (INDEPENDENT_AMBULATORY_CARE_PROVIDER_SITE_OTHER): Payer: Medicare Other | Admitting: Family Medicine

## 2012-03-20 ENCOUNTER — Encounter: Payer: Self-pay | Admitting: Family Medicine

## 2012-03-20 VITALS — BP 128/70 | HR 94 | Temp 97.7°F | Ht 65.25 in | Wt 248.4 lb

## 2012-03-20 DIAGNOSIS — E785 Hyperlipidemia, unspecified: Secondary | ICD-10-CM

## 2012-03-20 DIAGNOSIS — I10 Essential (primary) hypertension: Secondary | ICD-10-CM

## 2012-03-20 LAB — BASIC METABOLIC PANEL
BUN: 19 mg/dL (ref 6–23)
Chloride: 104 mEq/L (ref 96–112)
Glucose, Bld: 102 mg/dL — ABNORMAL HIGH (ref 70–99)
Potassium: 4.7 mEq/L (ref 3.5–5.1)

## 2012-03-20 LAB — HEPATIC FUNCTION PANEL
ALT: 19 U/L (ref 0–35)
AST: 21 U/L (ref 0–37)
Albumin: 3.9 g/dL (ref 3.5–5.2)

## 2012-03-20 LAB — LIPID PANEL: Cholesterol: 172 mg/dL (ref 0–200)

## 2012-03-20 MED ORDER — ATORVASTATIN CALCIUM 20 MG PO TABS: 20.0000 mg | ORAL_TABLET | Freq: Every day | ORAL | Status: DC

## 2012-03-20 MED ORDER — CARVEDILOL 12.5 MG PO TABS: 12.5000 mg | ORAL_TABLET | Freq: Two times a day (BID) | ORAL | Status: DC

## 2012-03-20 MED ORDER — LISINOPRIL-HYDROCHLOROTHIAZIDE 20-25 MG PO TABS: 1.0000 | ORAL_TABLET | Freq: Every day | ORAL | Status: DC

## 2012-03-20 NOTE — Progress Notes (Signed)
  Subjective:    Patient ID: April Combs, female    DOB: 1946-02-13, 67 y.o.   MRN: [id number]  HPI HTN- chronic problem, well controlled on Lisinopril-HCTZ.  No longer on Toprol, switching to Coreg (per cards).  No current CP, SOB, HAs, visual changes, edema.  Hyperlipidemia- chronic problem, on Lipitor.  No abd pain, N/V, myalgais   Review of Systems For ROS see HPI     Objective:   Physical Exam  Vitals reviewed. Constitutional: She is oriented to person, place, and time. She appears well-developed and well-nourished. No distress.       obese  HENT:  Head: Normocephalic and atraumatic.  Eyes: Conjunctivae normal and EOM are normal. Pupils are equal, round, and reactive to light.  Neck: Normal range of motion. Neck supple. No thyromegaly present.  Cardiovascular: Normal rate, normal heart sounds and intact distal pulses.   No murmur heard.      irreg S1/S2- following w/ cards  Pulmonary/Chest: Effort normal and breath sounds normal. No respiratory distress.  Abdominal: Soft. She exhibits no distension. There is no tenderness.  Musculoskeletal: She exhibits no edema.  Lymphadenopathy:    She has no cervical adenopathy.  Neurological: She is alert and oriented to person, place, and time.  Skin: Skin is warm and dry.  Psychiatric: She has a normal mood and affect. Her behavior is normal.          Assessment & Plan:

## 2012-03-20 NOTE — Assessment & Plan Note (Signed)
Chronic problem.  Adequate control.  Pt is now out of Metoprolol and due to start Coreg but would like script for 90 days rather than the one for 30 that cards gave her.  Script given.  Check labs.  Will recheck BP in 1 month since she is starting new medication.  Pt expressed understanding and is in agreement w/ plan.

## 2012-03-20 NOTE — Assessment & Plan Note (Signed)
Chronic problem.  Tolerating statin w/out difficulty.  Check labs.  Adjust meds prn  

## 2012-03-20 NOTE — Patient Instructions (Addendum)
Follow up in 1 month to recheck BP after start the Carvedilol We'll notify you of your lab results and make any changes if needed Call with any questions or concerns Happy New Year!!!

## 2012-03-24 ENCOUNTER — Telehealth: Payer: Self-pay | Admitting: *Deleted

## 2012-03-24 MED ORDER — FENOFIBRATE 160 MG PO TABS: 160.0000 mg | ORAL_TABLET | Freq: Every day | ORAL | Status: DC

## 2012-03-24 NOTE — Telephone Encounter (Signed)
Message copied by Verdie Shire on Fri Mar 24, 2012  5:02 PM ------      Message from: Sheliah Hatch      Created: Tue Mar 21, 2012  8:53 PM       Please send script for fenofibrate 160mg , #90, 3 refills

## 2012-03-24 NOTE — Telephone Encounter (Signed)
New rx sent to the pharmacy by e-script.//AB/CMA 

## 2012-04-29 ENCOUNTER — Other Ambulatory Visit: Payer: Self-pay

## 2012-05-03 ENCOUNTER — Ambulatory Visit (INDEPENDENT_AMBULATORY_CARE_PROVIDER_SITE_OTHER): Payer: Medicare Other | Admitting: Family Medicine

## 2012-05-03 ENCOUNTER — Encounter: Payer: Self-pay | Admitting: Family Medicine

## 2012-05-03 VITALS — BP 118/80 | HR 90 | Temp 97.6°F | Ht 65.25 in | Wt 247.6 lb

## 2012-05-03 DIAGNOSIS — I10 Essential (primary) hypertension: Secondary | ICD-10-CM

## 2012-05-03 DIAGNOSIS — K219 Gastro-esophageal reflux disease without esophagitis: Secondary | ICD-10-CM

## 2012-05-03 DIAGNOSIS — R0602 Shortness of breath: Secondary | ICD-10-CM

## 2012-05-03 MED ORDER — OMEPRAZOLE 20 MG PO CPDR: 20.0000 mg | DELAYED_RELEASE_CAPSULE | Freq: Every day | ORAL | Status: DC

## 2012-05-03 NOTE — Progress Notes (Signed)
  Subjective:    Patient ID: April Combs, female    DOB: Mar 02, 1946, 67 y.o.   MRN: [id number]  HPI HTN- chronic problem, on Coreg, Lisinopril HCT.  Excellent control today.  No CP.  + SOB.  No HAs, visual changes, edema.  SOB- pt reports sxs will worsen as the day goes on.  Worse w/ bending over, 'it cuts my air off'.  sxs started 2 weeks ago.  Started going to the gym 4 weeks ago but 2 weeks ago noticed a difference and is now having difficulty 'doing very much at all'.  No hx of asthma.  Hx of smoking, 17-20 pack year hx.  No SOB at rest.  + dry cough.  Will get SOB w/ putting socks and shoes on.  Saw pulmonary 'years ago'.  + hx of GERD- worse recently.  Taking cimetidine twice daily w/out relief.   Review of Systems For ROS see HPI     Objective:   Physical Exam  Vitals reviewed. Constitutional: She is oriented to person, place, and time. She appears well-developed and well-nourished. No distress.  obese  HENT:  Head: Normocephalic and atraumatic.  Eyes: Conjunctivae and EOM are normal. Pupils are equal, round, and reactive to light.  Neck: Normal range of motion. Neck supple. No thyromegaly present.  Cardiovascular: Normal rate, regular rhythm, normal heart sounds and intact distal pulses.   No murmur heard. Pulmonary/Chest: Effort normal and breath sounds normal. No respiratory distress. She has no wheezes. She has no rales.  No cough heard No O2 desat w/ ambulation around office  Abdominal: Soft. She exhibits no distension. There is no tenderness.  Musculoskeletal: She exhibits no edema.  Lymphadenopathy:    She has no cervical adenopathy.  Neurological: She is alert and oriented to person, place, and time.  Skin: Skin is warm and dry.  Psychiatric: She has a normal mood and affect. Her behavior is normal.          Assessment & Plan:

## 2012-05-03 NOTE — Patient Instructions (Addendum)
Follow up in 3-4 weeks to recheck symptoms STOP the Acid reducer START the Omeprazole daily to reduce acid Continue to walk regularly- improved physical fitness and exercise will reduce your symptoms If any questions or concerns- call me! Hang in there!

## 2012-05-15 DIAGNOSIS — K219 Gastro-esophageal reflux disease without esophagitis: Secondary | ICD-10-CM | POA: Insufficient documentation

## 2012-05-15 NOTE — Assessment & Plan Note (Signed)
New to provider, ongoing for pt.  Using OTC cimetidine but sxs have worsened x2 weeks (corresponds w/ increased SOB).  Stop OTC med, start PPI.  Reviewed supportive care and red flags that should prompt return.  Pt expressed understanding and is in agreement w/ plan.

## 2012-05-15 NOTE — Assessment & Plan Note (Signed)
Excellent control.  No changes.

## 2012-05-15 NOTE — Assessment & Plan Note (Signed)
Ongoing problem.  Worse recently.  Suspect component of deconditioning coupled w/ GERD.  No wheezing or desats w/ ambulation.  Start PPI and monitor sxs closely.  If no improvement, will refer to pulm although pt had recent cardio-pulm stress test that didn't show any significant restriction or obstruction.  Reviewed supportive care and red flags that should prompt return.  Pt expressed understanding and is in agreement w/ plan.

## 2012-05-24 ENCOUNTER — Ambulatory Visit (INDEPENDENT_AMBULATORY_CARE_PROVIDER_SITE_OTHER): Payer: Medicare Other | Admitting: Family Medicine

## 2012-05-24 ENCOUNTER — Encounter: Payer: Self-pay | Admitting: Family Medicine

## 2012-05-24 VITALS — BP 142/80 | HR 80 | Temp 97.9°F | Ht 65.25 in | Wt 246.0 lb

## 2012-05-24 DIAGNOSIS — K219 Gastro-esophageal reflux disease without esophagitis: Secondary | ICD-10-CM

## 2012-05-24 DIAGNOSIS — R0602 Shortness of breath: Secondary | ICD-10-CM

## 2012-05-24 MED ORDER — OMEPRAZOLE 40 MG PO CPDR: 40.0000 mg | DELAYED_RELEASE_CAPSULE | Freq: Every day | ORAL | Status: DC

## 2012-05-24 NOTE — Assessment & Plan Note (Signed)
Improved since starting Omeprazole but finding herself taking meds more than once daily.  Increase to 40mg  daily.  Will follow.

## 2012-05-24 NOTE — Progress Notes (Signed)
  Subjective:    Patient ID: April Combs, female    DOB: 1945-06-14, 67 y.o.   MRN: [id number]  HPI SOB- pt reports sxs have improved since stopping the H2 blocker and starting the Omeprazole.  Currently on 20mg  daily and finding that some times she needs to take this more than once a day.  Is now able to walk again w/out the difficulty she was having.  No CP, palpitations, edema, HAs.   Review of Systems For ROS see HPI     Objective:   Physical Exam  Vitals reviewed. Constitutional: She is oriented to person, place, and time. She appears well-developed and well-nourished. No distress.  HENT:  Head: Normocephalic and atraumatic.  Neck: Normal range of motion. Neck supple. No thyromegaly present.  Cardiovascular: Normal rate and intact distal pulses.   No murmur heard. Irregular S1/S2  Pulmonary/Chest: Effort normal and breath sounds normal. No respiratory distress. She has no wheezes. She has no rales.  Musculoskeletal: She exhibits no edema.  Lymphadenopathy:    She has no cervical adenopathy.  Neurological: She is alert and oriented to person, place, and time. No cranial nerve deficit. Coordination normal.  Skin: Skin is warm and dry.  Psychiatric: She has a normal mood and affect. Her behavior is normal.          Assessment & Plan:

## 2012-05-24 NOTE — Assessment & Plan Note (Signed)
Improved since starting Omeprazole and treating GERD.  Will continue to follow.

## 2012-05-24 NOTE — Patient Instructions (Addendum)
Schedule your complete physical for June Increase the Omeprazole to 2 tabs of what you have at home and 1 of the new script I'm so glad you're feeling better- you look good!!! Keep up the good work! Have a great trip!!!

## 2012-06-05 DIAGNOSIS — S93409A Sprain of unspecified ligament of unspecified ankle, initial encounter: Secondary | ICD-10-CM | POA: Diagnosis not present

## 2012-06-26 DIAGNOSIS — S93409A Sprain of unspecified ligament of unspecified ankle, initial encounter: Secondary | ICD-10-CM | POA: Diagnosis not present

## 2012-08-25 ENCOUNTER — Ambulatory Visit (INDEPENDENT_AMBULATORY_CARE_PROVIDER_SITE_OTHER): Payer: Medicare Other | Admitting: Family Medicine

## 2012-08-25 ENCOUNTER — Encounter: Payer: Self-pay | Admitting: Family Medicine

## 2012-08-25 VITALS — BP 130/90 | HR 47 | Temp 97.9°F | Ht 65.25 in | Wt 244.8 lb

## 2012-08-25 DIAGNOSIS — E785 Hyperlipidemia, unspecified: Secondary | ICD-10-CM

## 2012-08-25 DIAGNOSIS — I10 Essential (primary) hypertension: Secondary | ICD-10-CM

## 2012-08-25 DIAGNOSIS — Z0000 Encounter for general adult medical examination without abnormal findings: Secondary | ICD-10-CM | POA: Diagnosis not present

## 2012-08-25 LAB — HEPATIC FUNCTION PANEL
ALT: 16 U/L (ref 0–35)
AST: 19 U/L (ref 0–37)
Albumin: 4 g/dL (ref 3.5–5.2)
Alkaline Phosphatase: 39 U/L (ref 39–117)

## 2012-08-25 LAB — BASIC METABOLIC PANEL
BUN: 23 mg/dL (ref 6–23)
CO2: 21 mEq/L (ref 19–32)
Chloride: 103 mEq/L (ref 96–112)
Glucose, Bld: 90 mg/dL (ref 70–99)
Potassium: 4.1 mEq/L (ref 3.5–5.1)
Sodium: 139 mEq/L (ref 135–145)

## 2012-08-25 LAB — CBC WITH DIFFERENTIAL/PLATELET
Basophils Relative: 1.3 % (ref 0.0–3.0)
Eosinophils Relative: 4.7 % (ref 0.0–5.0)
Lymphocytes Relative: 30.6 % (ref 12.0–46.0)
MCV: 92.1 fl (ref 78.0–100.0)
Monocytes Relative: 7.9 % (ref 3.0–12.0)
Neutrophils Relative %: 55.5 % (ref 43.0–77.0)
Platelets: 179 10*3/uL (ref 150.0–400.0)
RBC: 4.36 Mil/uL (ref 3.87–5.11)
WBC: 6.4 10*3/uL (ref 4.5–10.5)

## 2012-08-25 LAB — LIPID PANEL
HDL: 60.4 mg/dL (ref >39.00)
Total CHOL/HDL Ratio: 3
VLDL: 30.4 mg/dL (ref 0.0–40.0)

## 2012-08-25 LAB — TSH: TSH: 3.18 u[IU]/mL (ref 0.35–5.50)

## 2012-08-25 MED ORDER — CARVEDILOL 12.5 MG PO TABS: 12.5000 mg | ORAL_TABLET | Freq: Two times a day (BID) | ORAL | Status: DC

## 2012-08-25 MED ORDER — OMEPRAZOLE 40 MG PO CPDR: 40.0000 mg | DELAYED_RELEASE_CAPSULE | Freq: Every day | ORAL | Status: DC

## 2012-08-25 MED ORDER — LISINOPRIL-HYDROCHLOROTHIAZIDE 20-25 MG PO TABS: 1.0000 | ORAL_TABLET | Freq: Every day | ORAL | Status: DC

## 2012-08-25 MED ORDER — ATORVASTATIN CALCIUM 20 MG PO TABS: 20.0000 mg | ORAL_TABLET | Freq: Every day | ORAL | Status: DC

## 2012-08-25 MED ORDER — FENOFIBRATE 160 MG PO TABS: 160.0000 mg | ORAL_TABLET | Freq: Every day | ORAL | Status: DC

## 2012-08-25 NOTE — Assessment & Plan Note (Signed)
Pt's PE WNL w/ exception of known abnormalities (irregular heart beat, sun damage).  UTD on health maintenance, check labs.  Anticipatory guidance provided.

## 2012-08-25 NOTE — Patient Instructions (Addendum)
Follow up in 6 months to recheck BP and cholesterol We'll notify you of your lab results and make any changes if needed Consider following up w/ Dr Charlett Blake if your leg continues to hurt Schedule an eye exam! Call with any questions or concerns Sherri Rad in there!

## 2012-08-25 NOTE — Progress Notes (Signed)
  Subjective:    Patient ID: April Combs, female    DOB: May 11, 1945, 67 y.o.   MRN: [id number]  HPI Here today for CPE.  Risk Factors: HTN- adequate control on Lisinopril HCTZ, Coreg.  No CP, mild SOB, no HAs, visual changes, edema. Hyperlipidemia- chronic problem, on Lipitor and fenofibrate.  No abd, N/V, myalgias  Physical Activity: very limited exercise due to sensation of leg giving way, has seen ortho and they feel she has a torn tendon, next step is MRI, pt has not returned. Fall Risk: low risk Depression: chronic problem, asymptomatic currently Hearing: normal to conversational tones and whispered voice at 6 ft ADL's: independent Cognitive: normal linear thought process, memory and attention intact Home Safety: safe at home Height, Weight, BMI, Visual Acuity: see vitals, vision corrected to 20/20 w/ glasses Counseling: overdue on eye exam, UTD on mammo, colonoscopy, no need for pap due to hysterectomy Labs Ordered: See A&P Care Plan: See A&P    Review of Systems Patient reports no vision/ hearing changes, adenopathy,fever, weight change,  persistant/recurrent hoarseness , swallowing issues, chest pain, palpitations, edema, hemoptysis, dyspnea (rest/exertional/paroxysmal nocturnal), gastrointestinal bleeding (melena, rectal bleeding), abdominal pain, significant heartburn, bowel changes, GU symptoms (dysuria, hematuria, incontinence), Gyn symptoms (abnormal  bleeding, pain),  syncope, focal weakness, memory loss, numbness & tingling, skin/hair/nail changes, abnormal bruising or bleeding, anxiety, or depression.  + cough- pt is attributing this to allergies     Objective:   Physical Exam General Appearance:    Alert, cooperative, no distress, appears older than stated age.  obese  Head:    Normocephalic, without obvious abnormality, atraumatic  Eyes:    PERRL, conjunctiva/corneas clear, EOM's intact, fundi    benign, both eyes  Ears:    Normal TM's and external ear canals,  both ears  Nose:   Nares normal, septum midline, mucosa normal, no drainage    or sinus tenderness  Throat:   Lips, mucosa, and tongue normal; teeth and gums normal  Neck:   Supple, symmetrical, trachea midline, no adenopathy;    Thyroid: no enlargement/tenderness/nodules  Back:     Symmetric, no curvature, ROM normal, no CVA tenderness  Lungs:     Clear to auscultation bilaterally, respirations unlabored  Chest Wall:    No tenderness or deformity   Heart:    irreg S1/S2  Breast Exam:    Deferred at pt's request  Abdomen:     Soft, non-tender, bowel sounds active all four quadrants,    no masses, no organomegaly  Genitalia:    Deferred at pt's request  Rectal:    Extremities:   Extremities normal, atraumatic, no cyanosis or edema  Pulses:   2+ and symmetric all extremities  Skin:   Skin color, texture, turgor normal, multiple areas consistent w/ sun damage  Lymph nodes:   Cervical, supraclavicular, and axillary nodes normal  Neurologic:   CNII-XII intact, normal strength, sensation and reflexes    throughout          Assessment & Plan:

## 2012-08-25 NOTE — Assessment & Plan Note (Signed)
Chronic problem.  Tolerating statin w/out difficulty.  Check labs.  Adjust meds prn  

## 2012-08-25 NOTE — Assessment & Plan Note (Signed)
Chronic problem, adequate control.  Asymptomatic- continues to follow w/ cards.  No anticipated med changes- will await labs.

## 2012-10-18 ENCOUNTER — Other Ambulatory Visit: Payer: Self-pay

## 2013-01-17 ENCOUNTER — Encounter: Payer: Self-pay | Admitting: Cardiology

## 2013-01-17 ENCOUNTER — Ambulatory Visit (INDEPENDENT_AMBULATORY_CARE_PROVIDER_SITE_OTHER): Payer: Medicare Other | Admitting: Cardiology

## 2013-01-17 VITALS — BP 110/86 | HR 70 | Ht 65.0 in | Wt 244.9 lb

## 2013-01-17 DIAGNOSIS — E785 Hyperlipidemia, unspecified: Secondary | ICD-10-CM

## 2013-01-17 DIAGNOSIS — R0602 Shortness of breath: Secondary | ICD-10-CM

## 2013-01-17 DIAGNOSIS — I499 Cardiac arrhythmia, unspecified: Secondary | ICD-10-CM

## 2013-01-17 DIAGNOSIS — I10 Essential (primary) hypertension: Secondary | ICD-10-CM

## 2013-01-17 NOTE — Progress Notes (Signed)
PATIENT: April Combs MRN: [id number]  DOB: June 19, 1945   DOV:01/20/2013 PCP: April Rhymes, MD  Clinic Note: Chief Complaint  Patient presents with  . Annual Exam    no chest pian , sob w/activity, no edema    HPI: April Combs is a 67 y.o. morbidly obese Caucasian female with a PMH below who presents today for annual followup. She was a for a patient of Dr. Julieanne Combs with a history of nonischemic cardiomyopathy that is all but resolved. Her major she symptom is exertional dyspnea.  Interval History: She presents today relatively in her usual state of health. She still gets short of breath with exertion, but denies any chest pressure or tightness associated with it. No symptoms at rest, but does get short of breath with bending over. She continues to be not very active, but is at least stable as far as weight goes. She still has some mild vertigo-type dizziness, but no lightheadedness, weakness or syncope/near syncope. She has mild edema, but does not complain of PND or orthopnea. She has occasional palpitations, no rapid heartbeats or fluttering. She notes occasional orthostatic symptoms but is mostly vertigo. She does describe some GERD-type symptoms which is not necessarily associated with any particular food or activity.  Additional cardiac review of systems: Melena - no, hematochezia no; hematuria - no; nosebleeds - no; claudication - no  Past Medical History  Diagnosis Date  . Depression   . Hyperlipidemia   . Hypertension   . GERD (gastroesophageal reflux disease)   . NICM (nonischemic cardiomyopathy)     Essentially resolved. Echocardiogram July 2013 showed normal EF greater than 55%. Important septal motion. Normal LV filling pressures. Mild MR and mild anterior MVP   Prior Cardiac Evaluation and Past Surgical History: Past Surgical History  Procedure Laterality Date  . Abdominal hysterectomy    . Gastric bypass  1980  . Appendectomy  as child  . Kidney  stone surgery    . Exercise tolerance test - cpet-met-test  July 2013    Submaximal effort. Only 80% of her, therefore peak VO2 of 75% is likely an underestimate. High resting heart rate, achieving 86% of heart rate. --> Unable to interpret due to lack of effort.  . Pulmonary function tests  July 2013    Increased densities, decreased FVC - consistent with obesity hypoventilation; FEV1 is 58%, FVC 60% of predicted.  [name] [facility] myoview [facility]td  ~2011    No ischemia, no infarct   No Known Allergies  Current Outpatient Prescriptions  Medication Sig Dispense Refill  . atorvastatin (LIPITOR) 20 MG tablet Take 1 tablet (20 mg total) by mouth daily.  90 tablet  3  . carvedilol (COREG) 12.5 MG tablet Take 1 tablet (12.5 mg total) by mouth 2 (two) times daily with a meal.  180 tablet  3  . fenofibrate 160 MG tablet Take 1 tablet (160 mg total) by mouth daily.  90 tablet  3  . lisinopril-hydrochlorothiazide (PRINZIDE,ZESTORETIC) 20-25 MG per tablet Take 1 tablet by mouth daily.  90 tablet  3  . meclizine (ANTIVERT) 50 MG tablet Take 1 tablet (50 mg total) by mouth 3 (three) times daily as needed for dizziness.  60 tablet  0  . omeprazole (PRILOSEC) 40 MG capsule Take 1 capsule (40 mg total) by mouth daily.  90 capsule  3  . dicyclomine (BENTYL) 10 MG capsule Take 1 capsule (10 mg total) by mouth 4 (four) times daily as needed.  60 capsule  0  No current facility-administered medications for this visit.    History   Social History Narrative   She is married to April Combs, also patient of Dr. Herbie Combs.   Mother of 3, grandmother 46.   Quit smoking in 2005. Social alcohol.   No routine exercise.   ROS: A comprehensive Review of Systems - Negative except Noted above  PHYSICAL EXAM BP 110/86  Pulse 70  Ht 5\' 5"  (1.651 m)  Wt 244 lb 14.4 oz (111.086 kg)  BMI 40.75 kg/m2 General appearance: alert, cooperative, appears stated age, no distress and morbidly obese Neck: no adenopathy, no carotid bruit, no  JVD and supple, symmetrical, trachea midline Lungs: clear to auscultation bilaterally, normal percussion bilaterally and Nonlabored, good movement Heart: normal apical impulse, regular rate and rhythm, S1, S2 normal, no S3 or S4 and 1/6 HSM at apex. No click. No R./G. Abdomen: soft, non-tender; bowel sounds normal; no masses,  no organomegaly and Morbidly obese; unable to palpate organomegaly Extremities: edema Trace edema. No clubbing or cyanosis. Mild venous stasis changes. Pulses: 2+ and symmetric Neurologic: Grossly normal  [id number] today: Yes Rate: 70  , Rhythm:  NSR, nonspecific ST-T changes with inferior T-wave inversion. No significant change.;    Recent Labs:  cholesterol from June 2014    TC 170, TG 1-2, LDL 79, HDL 60.-- Apical   ASSESSMENT / PLAN: SOB (shortness of breath) Again this is a chronic condition for her. Mostly she just needs to lose weight. She has obesity hypoventilation. I have recommended dietary modification and exercise. Her cardiac evaluation has essentially shown only tachycardia and hypertension.  Severe obesity (BMI >= 40) This is probably her most likely etiology of her dyspnea. She as far as I can tell has not been evaluated for sleep apnea, but this could be possible. I did talk about the importance of dietary modification with exercise. I would strongly consider a nutrition consultation. I recommended she get involved in some type of Silver speakers type of peripheral rim that has organized exercises.  HYPERTENSION Well-controlled chronic problem. Much better with carvedilol than metoprolol in concert with lisinopril- HCTZ. Her palpitations have also remained stable.  Irregular heart beat She's had palpitations in the past. She doesn't PVCs. With a relatively normal echocardiogram, and prior ischemic evaluation benign. I would simply continue with beta blockers. Would only be more aggressive if she is symptomatic.  Hyperlipidemia Doing well on  statin. Most recent labs checked by PCP were excellent.    Orders Placed This Encounter  Procedures  . EKG 12-Lead   No orders of the defined types were placed in this encounter.    Followup:one year  DAVID W. April Combs, M.D., M.S. [facility] [address] [place], [place]  [address]  [phone] Pager # [phone]

## 2013-01-17 NOTE — Patient Instructions (Signed)
Your Blood Pressure & Cholesterol levels look great.  As we discussed, we really need to start watching your diet (eating less & eating smarter) along with gradually increasing your exercise level -- will work in concert to help with weight loss.  Goal is 10% of your body weight loss  Per year ( ~ 24 lb which is = to 2 lb per month).  '  I will see you back in ~1 yr.  Marykay Lex, MD

## 2013-01-18 ENCOUNTER — Other Ambulatory Visit: Payer: Self-pay

## 2013-01-20 ENCOUNTER — Encounter: Payer: Self-pay | Admitting: Cardiology

## 2013-01-20 NOTE — Assessment & Plan Note (Addendum)
Again this is a chronic condition for her. Mostly she just needs to lose weight. She has obesity hypoventilation. I have recommended dietary modification and exercise. Her cardiac evaluation has essentially shown only tachycardia and hypertension.

## 2013-01-20 NOTE — Assessment & Plan Note (Signed)
This is probably her most likely etiology of her dyspnea. She as far as I can tell has not been evaluated for sleep apnea, but this could be possible. I did talk about the importance of dietary modification with exercise. I would strongly consider a nutrition consultation. I recommended she get involved in some type of Silver speakers type of peripheral rim that has organized exercises.

## 2013-01-20 NOTE — Assessment & Plan Note (Signed)
Well-controlled chronic problem. Much better with carvedilol than metoprolol in concert with lisinopril- HCTZ. Her palpitations have also remained stable.

## 2013-01-20 NOTE — Assessment & Plan Note (Signed)
She's had palpitations in the past. She doesn't PVCs. With a relatively normal echocardiogram, and prior ischemic evaluation benign. I would simply continue with beta blockers. Would only be more aggressive if she is symptomatic.

## 2013-01-20 NOTE — Assessment & Plan Note (Signed)
Doing well on statin. Most recent labs checked by PCP were excellent.

## 2013-02-28 ENCOUNTER — Encounter: Payer: Self-pay | Admitting: Family Medicine

## 2013-02-28 ENCOUNTER — Ambulatory Visit (INDEPENDENT_AMBULATORY_CARE_PROVIDER_SITE_OTHER): Payer: Medicare Other | Admitting: Family Medicine

## 2013-02-28 VITALS — BP 132/86 | HR 63 | Temp 98.2°F | Resp 16 | Wt 245.5 lb

## 2013-02-28 DIAGNOSIS — I10 Essential (primary) hypertension: Secondary | ICD-10-CM | POA: Diagnosis not present

## 2013-02-28 DIAGNOSIS — E785 Hyperlipidemia, unspecified: Secondary | ICD-10-CM | POA: Diagnosis not present

## 2013-02-28 DIAGNOSIS — K219 Gastro-esophageal reflux disease without esophagitis: Secondary | ICD-10-CM | POA: Diagnosis not present

## 2013-02-28 LAB — BASIC METABOLIC PANEL
CO2: 27 mEq/L (ref 19–32)
Calcium: 9.3 mg/dL (ref 8.4–10.5)
GFR: 74.01 mL/min (ref >60.00)
Glucose, Bld: 101 mg/dL — ABNORMAL HIGH (ref 70–99)
Potassium: 4.3 mEq/L (ref 3.5–5.1)
Sodium: 138 mEq/L (ref 135–145)

## 2013-02-28 LAB — LIPID PANEL
Cholesterol: 166 mg/dL (ref 0–200)
LDL Cholesterol: 77 mg/dL (ref 0–99)
Total CHOL/HDL Ratio: 3
VLDL: 33.4 mg/dL (ref 0.0–40.0)

## 2013-02-28 LAB — HEPATIC FUNCTION PANEL
AST: 22 U/L (ref 0–37)
Albumin: 4.4 g/dL (ref 3.5–5.2)
Alkaline Phosphatase: 38 U/L — ABNORMAL LOW (ref 39–117)
Bilirubin, Direct: 0.1 mg/dL (ref 0.0–0.3)

## 2013-02-28 MED ORDER — PANTOPRAZOLE SODIUM 40 MG PO TBEC: 40.0000 mg | DELAYED_RELEASE_TABLET | Freq: Every day | ORAL | Status: DC

## 2013-02-28 NOTE — Patient Instructions (Signed)
Schedule your complete physical in 6 months We'll notify you of your lab results and make any changes if needed Try and get regular exercise and make healthy food choices STOP the Omeprazole START the Protonix daily Call with any questions or concerns April Combs Christmas!!!

## 2013-02-28 NOTE — Progress Notes (Signed)
Pre visit review using our clinic review tool, if applicable. No additional management support is needed unless otherwise documented below in the visit note. 

## 2013-02-28 NOTE — Assessment & Plan Note (Signed)
Chronic problem.  Adequate control.  Asymptomatic.  Check labs.  No anticipated med changes 

## 2013-02-28 NOTE — Assessment & Plan Note (Signed)
Chronic problem.  Tolerating meds w/out difficulty.  Check labs.  Adjust meds prn  

## 2013-02-28 NOTE — Progress Notes (Signed)
   Subjective:    Patient ID: April Combs, female    DOB: 10/25/1945, 67 y.o.   MRN: [id number]  HPI HTN- chronic problem, on coreg and Lisinopril HCT.  No CP.  Ongoing SOB- at baseline.  No HAs, visual changes, edema.  GERD- worse than previous.  Requiring 2 omeprazole 40mg  daily.  Pt is having sour brash and regurg sxs.  Hyperlipidemia- chronic problem, on Lipitor.  No N/V, abd pain, myalgias.  Review of Systems For ROS see HPI     Objective:   Physical Exam  Vitals reviewed. Constitutional: She is oriented to person, place, and time. She appears well-developed and well-nourished. No distress.  HENT:  Head: Normocephalic and atraumatic.  Eyes: Conjunctivae and EOM are normal. Pupils are equal, round, and reactive to light.  Neck: Normal range of motion. Neck supple. No thyromegaly present.  Cardiovascular: Normal rate, regular rhythm, normal heart sounds and intact distal pulses.   No murmur heard. Pulmonary/Chest: Effort normal and breath sounds normal. No respiratory distress.  Abdominal: Soft. She exhibits no distension. There is no tenderness.  Musculoskeletal: She exhibits no edema.  Lymphadenopathy:    She has no cervical adenopathy.  Neurological: She is alert and oriented to person, place, and time.  Skin: Skin is warm and dry.  Psychiatric: She has a normal mood and affect. Her behavior is normal.          Assessment & Plan:

## 2013-02-28 NOTE — Assessment & Plan Note (Signed)
Deteriorated.  Stop prilosec.  Start Protonix.  Monitor for improvement.

## 2013-04-05 ENCOUNTER — Telehealth: Payer: Self-pay | Admitting: Family Medicine

## 2013-04-05 NOTE — Telephone Encounter (Signed)
Error. BC °

## 2013-04-11 DIAGNOSIS — S335XXA Sprain of ligaments of lumbar spine, initial encounter: Secondary | ICD-10-CM | POA: Diagnosis not present

## 2013-04-16 ENCOUNTER — Encounter: Payer: Self-pay | Admitting: Family Medicine

## 2013-04-17 MED ORDER — OMEPRAZOLE 40 MG PO CPDR: 40.0000 mg | DELAYED_RELEASE_CAPSULE | Freq: Every day | ORAL | Status: DC

## 2013-08-29 ENCOUNTER — Encounter: Payer: Medicare Other | Admitting: Family Medicine

## 2013-09-18 ENCOUNTER — Telehealth: Payer: Self-pay

## 2013-09-18 NOTE — Telephone Encounter (Signed)
Medication and allergies:  Reviewed and updated  90 day supply/mail order: n/a Local pharmacy:  [facility] [place] [place] - [address]   Immunizations due:  See below   A/P: Personal, family history and past surgical hx: Reviewed and updated PAP- no longer receives CCS- 01/26/12- benign polyps; repeat in 10 years---Dr. Olevia Perches [facility]- 10/30/11-negative; no longer wants to receive BD- 07/19/08--normal Tdap- 02/29/08 PNA- DUE--medicare patients Shingles- DUE--medicare patients  To Discuss with Provider: Nothing at this time.

## 2013-09-19 ENCOUNTER — Encounter: Payer: Self-pay | Admitting: Family Medicine

## 2013-09-19 ENCOUNTER — Encounter: Payer: Medicare Other | Admitting: Family Medicine

## 2013-09-19 ENCOUNTER — Ambulatory Visit (INDEPENDENT_AMBULATORY_CARE_PROVIDER_SITE_OTHER): Payer: Medicare Other | Admitting: Family Medicine

## 2013-09-19 ENCOUNTER — Ambulatory Visit (INDEPENDENT_AMBULATORY_CARE_PROVIDER_SITE_OTHER)
Admission: RE | Admit: 2013-09-19 | Discharge: 2013-09-19 | Disposition: A | Discharge status: Home / Self Care | Payer: Medicare Other | Source: Ambulatory Visit | Attending: Family Medicine | Admitting: Family Medicine

## 2013-09-19 VITALS — BP 130/84 | HR 80 | Temp 98.2°F | Resp 16 | Ht 65.0 in | Wt 245.0 lb

## 2013-09-19 DIAGNOSIS — K219 Gastro-esophageal reflux disease without esophagitis: Secondary | ICD-10-CM | POA: Diagnosis not present

## 2013-09-19 DIAGNOSIS — I10 Essential (primary) hypertension: Secondary | ICD-10-CM | POA: Diagnosis not present

## 2013-09-19 DIAGNOSIS — Z0000 Encounter for general adult medical examination without abnormal findings: Secondary | ICD-10-CM

## 2013-09-19 DIAGNOSIS — E785 Hyperlipidemia, unspecified: Secondary | ICD-10-CM | POA: Diagnosis not present

## 2013-09-19 DIAGNOSIS — Z23 Encounter for immunization: Secondary | ICD-10-CM

## 2013-09-19 DIAGNOSIS — Z1231 Encounter for screening mammogram for malignant neoplasm of breast: Secondary | ICD-10-CM

## 2013-09-19 DIAGNOSIS — R0602 Shortness of breath: Secondary | ICD-10-CM

## 2013-09-19 DIAGNOSIS — R059 Cough, unspecified: Secondary | ICD-10-CM | POA: Diagnosis not present

## 2013-09-19 DIAGNOSIS — R05 Cough: Secondary | ICD-10-CM | POA: Diagnosis not present

## 2013-09-19 LAB — LIPID PANEL
Cholesterol: 161 mg/dL (ref 0–200)
HDL: 58 mg/dL (ref >39.00)
LDL Cholesterol: 74 mg/dL (ref 0–99)
NONHDL: 103
Total CHOL/HDL Ratio: 3
Triglycerides: 147 mg/dL (ref 0.0–149.0)
VLDL: 29.4 mg/dL (ref 0.0–40.0)

## 2013-09-19 LAB — CBC WITH DIFFERENTIAL/PLATELET
BASOS ABS: 0 10*3/uL (ref 0.0–0.1)
Basophils Relative: 0.6 % (ref 0.0–3.0)
EOS ABS: 0.3 10*3/uL (ref 0.0–0.7)
Eosinophils Relative: 7.7 % — ABNORMAL HIGH (ref 0.0–5.0)
HCT: 38.4 % (ref 36.0–46.0)
Hemoglobin: 12.9 g/dL (ref 12.0–15.0)
LYMPHS PCT: 33.8 % (ref 12.0–46.0)
Lymphs Abs: 1.5 10*3/uL (ref 0.7–4.0)
MCHC: 33.6 g/dL (ref 30.0–36.0)
MCV: 91.7 fl (ref 78.0–100.0)
Monocytes Absolute: 0.3 10*3/uL (ref 0.1–1.0)
Monocytes Relative: 7.2 % (ref 3.0–12.0)
Neutro Abs: 2.3 10*3/uL (ref 1.4–7.7)
Neutrophils Relative %: 50.7 % (ref 43.0–77.0)
Platelets: 173 10*3/uL (ref 150.0–400.0)
RBC: 4.19 Mil/uL (ref 3.87–5.11)
RDW: 13.3 % (ref 11.5–15.5)
WBC: 4.4 10*3/uL (ref 4.0–10.5)

## 2013-09-19 LAB — BASIC METABOLIC PANEL
BUN: 23 mg/dL (ref 6–23)
CALCIUM: 9.5 mg/dL (ref 8.4–10.5)
CO2: 31 mEq/L (ref 19–32)
CREATININE: 0.8 mg/dL (ref 0.4–1.2)
Chloride: 101 mEq/L (ref 96–112)
GFR: 78.28 mL/min (ref >60.00)
GLUCOSE: 105 mg/dL — AB (ref 70–99)
Potassium: 4.2 mEq/L (ref 3.5–5.1)
SODIUM: 138 meq/L (ref 135–145)

## 2013-09-19 LAB — HEPATIC FUNCTION PANEL
ALT: 16 U/L (ref 0–35)
AST: 22 U/L (ref 0–37)
Albumin: 4 g/dL (ref 3.5–5.2)
Alkaline Phosphatase: 38 U/L — ABNORMAL LOW (ref 39–117)
BILIRUBIN TOTAL: 0.6 mg/dL (ref 0.2–1.2)
Bilirubin, Direct: 0.1 mg/dL (ref 0.0–0.3)
Total Protein: 7 g/dL (ref 6.0–8.3)

## 2013-09-19 LAB — TSH: TSH: 2.68 u[IU]/mL (ref 0.35–4.50)

## 2013-09-19 MED ORDER — LISINOPRIL-HYDROCHLOROTHIAZIDE 20-25 MG PO TABS: 1.0000 | ORAL_TABLET | Freq: Every day | ORAL | Status: DC

## 2013-09-19 MED ORDER — OMEPRAZOLE 40 MG PO CPDR: 40.0000 mg | DELAYED_RELEASE_CAPSULE | Freq: Every day | ORAL | Status: DC

## 2013-09-19 MED ORDER — MECLIZINE HCL 50 MG PO TABS: 50.0000 mg | ORAL_TABLET | Freq: Three times a day (TID) | ORAL | Status: DC | PRN

## 2013-09-19 MED ORDER — CARVEDILOL 12.5 MG PO TABS: 12.5000 mg | ORAL_TABLET | Freq: Two times a day (BID) | ORAL | Status: DC

## 2013-09-19 MED ORDER — ATORVASTATIN CALCIUM 20 MG PO TABS: 20.0000 mg | ORAL_TABLET | Freq: Every day | ORAL | Status: DC

## 2013-09-19 NOTE — Assessment & Plan Note (Signed)
Chronic problem.  Discussed importance of weight loss.  Reviewed need for healthy diet since her exercise is so limited.  Check labs to risk stratify.  Will follow.

## 2013-09-19 NOTE — Progress Notes (Signed)
Pre visit review using our clinic review tool, if applicable. No additional management support is needed unless otherwise documented below in the visit note. 

## 2013-09-19 NOTE — Assessment & Plan Note (Signed)
Chronic problem.  Refill Omeprazole

## 2013-09-19 NOTE — Patient Instructions (Signed)
Follow up in 6 months to recheck BP and cholesterol We'll notify you of your lab results and make any changes if needed Go to the [facility] office and get your chest xray at your convenience Call with any questions or concerns Have a great summer!

## 2013-09-19 NOTE — Assessment & Plan Note (Signed)
Pt's PE WNL.  UTD on colonoscopy.  Due for mammo.  Refusing DEXA.  No longer having paps.  Check labs.  Anticipatory guidance provided.

## 2013-09-19 NOTE — Assessment & Plan Note (Signed)
Chronic problem.  Well controlled.  Asymptomatic.  Check labs.  No anticipated med changes. 

## 2013-09-19 NOTE — Progress Notes (Signed)
   Subjective:    Patient ID: April Combs, female    DOB: 05/17/1945, 68 y.o.   MRN: [id number]  HPI Here today for CPE.  Risk Factors: HTN- chronic problem, adequate control on Lisinopril HCTZ, Coreg.  No CP, + SOB- 'i've had that for awhile'.  Former smoker.  Sister recently dx'd w/ Stage IV lung cancer.  Pt requesting CXR.   Hyperlipidemia- chronic problem, on Fenofibrate Obesity- chronic problem.  Weight is unchanged from 6 months ago. Physical Activity: very limited due to SOB and leg pain Fall Risk: low risk Depression: denies current sxs Hearing: normal to conversational tones and whispered voice at 6 ft ADL's: independent Cognitive: normal linear thought process, memory and attention intact Home Safety: safe at home Height, Weight, BMI, Visual Acuity: see vitals, vision corrected to 20/20 w/ glasses Counseling: UTD on colonoscopy, due for mammo and DEXA- pt refusing.  No longer having paps. Labs Ordered: See A&P Care Plan: See A&P    Review of Systems Patient reports no vision/ hearing changes, adenopathy,fever, weight change,  persistant/recurrent hoarseness , swallowing issues, chest pain, palpitations, edema, hemoptysis, gastrointestinal bleeding (melena, rectal bleeding), abdominal pain, significant heartburn, bowel changes, GU symptoms (dysuria, hematuria, incontinence), Gyn symptoms (abnormal  bleeding, pain),  syncope, focal weakness, memory loss, numbness & tingling, skin/hair/nail changes, abnormal bruising or bleeding, anxiety, or depression.     Objective:   Physical Exam General Appearance:    Alert, cooperative, no distress, appears stated age, obese  Head:    Normocephalic, without obvious abnormality, atraumatic  Eyes:    PERRL, conjunctiva/corneas clear, EOM's intact, fundi    benign, both eyes  Ears:    Normal TM's and external ear canals, both ears  Nose:   Nares normal, septum midline, mucosa normal, no drainage    or sinus tenderness  Throat:    Lips, mucosa, and tongue normal; teeth and gums normal  Neck:   Supple, symmetrical, trachea midline, no adenopathy;    Thyroid: no enlargement/tenderness/nodules  Back:     Symmetric, no curvature, ROM normal, no CVA tenderness  Lungs:     Clear to auscultation bilaterally, respirations unlabored  Chest Wall:    No tenderness or deformity   Heart:    Regular rate and rhythm, S1 and S2 normal, no murmur, rub   or gallop  Breast Exam:    Deferred to mammo  Abdomen:     Soft, non-tender, bowel sounds active all four quadrants,    no masses, no organomegaly  Genitalia:    Deferred at pt's request  Rectal:    Extremities:   Extremities normal, atraumatic, no cyanosis or edema  Pulses:   2+ and symmetric all extremities  Skin:   Skin color, texture, turgor normal, no rashes or lesions  Lymph nodes:   Cervical, supraclavicular, and axillary nodes normal  Neurologic:   CNII-XII intact, normal strength, sensation and reflexes    throughout          Assessment & Plan:

## 2013-09-19 NOTE — Assessment & Plan Note (Signed)
Ongoing issue for pt but she is more worried about this after sister's recent stage IV lung cancer dx.  Get CXR to assess.  Will follow.

## 2013-09-19 NOTE — Assessment & Plan Note (Signed)
Chronic problem.  Tolerating statin w/o difficulty.  Pt unable to exercise due to knee pain.  Stressed need for healthy diet.  Check labs.  Adjust meds prn

## 2013-09-20 ENCOUNTER — Telehealth: Payer: Self-pay | Admitting: Family Medicine

## 2013-09-20 NOTE — Telephone Encounter (Signed)
Relevant patient education assigned to patient using Emmi. ° °

## 2013-09-21 ENCOUNTER — Other Ambulatory Visit: Payer: Self-pay | Admitting: Family Medicine

## 2013-09-21 NOTE — Telephone Encounter (Signed)
Med filled.  

## 2013-10-12 ENCOUNTER — Ambulatory Visit
Admission: RE | Admit: 2013-10-12 | Discharge: 2013-10-12 | Disposition: A | Discharge status: Home / Self Care | Payer: Medicare Other | Source: Ambulatory Visit | Attending: Family Medicine | Admitting: Family Medicine

## 2013-10-12 DIAGNOSIS — Z1231 Encounter for screening mammogram for malignant neoplasm of breast: Secondary | ICD-10-CM | POA: Diagnosis not present

## 2013-11-01 ENCOUNTER — Other Ambulatory Visit: Payer: Self-pay | Admitting: Family Medicine

## 2013-11-01 NOTE — Telephone Encounter (Signed)
Med filled.  

## 2013-11-30 ENCOUNTER — Encounter: Payer: Medicare Other | Admitting: Family Medicine

## 2013-12-18 ENCOUNTER — Other Ambulatory Visit: Payer: Self-pay | Admitting: General Practice

## 2013-12-18 MED ORDER — OMEPRAZOLE 40 MG PO CPDR: DELAYED_RELEASE_CAPSULE | ORAL | Status: DC

## 2014-01-13 DIAGNOSIS — Z0389 Encounter for observation for other suspected diseases and conditions ruled out: Secondary | ICD-10-CM

## 2014-01-13 HISTORY — PX: NM MYOVIEW LTD: HXRAD82

## 2014-01-13 HISTORY — DX: Encounter for observation for other suspected diseases and conditions ruled out: Z03.89

## 2014-01-14 ENCOUNTER — Encounter: Payer: Self-pay | Admitting: Cardiology

## 2014-01-14 ENCOUNTER — Ambulatory Visit (INDEPENDENT_AMBULATORY_CARE_PROVIDER_SITE_OTHER): Payer: Medicare Other | Admitting: Cardiology

## 2014-01-14 VITALS — BP 110/60 | HR 75 | Ht 65.0 in | Wt 243.8 lb

## 2014-01-14 DIAGNOSIS — R0609 Other forms of dyspnea: Secondary | ICD-10-CM

## 2014-01-14 DIAGNOSIS — I499 Cardiac arrhythmia, unspecified: Secondary | ICD-10-CM

## 2014-01-14 DIAGNOSIS — E785 Hyperlipidemia, unspecified: Secondary | ICD-10-CM | POA: Diagnosis not present

## 2014-01-14 DIAGNOSIS — R079 Chest pain, unspecified: Secondary | ICD-10-CM

## 2014-01-14 DIAGNOSIS — J439 Emphysema, unspecified: Secondary | ICD-10-CM | POA: Insufficient documentation

## 2014-01-14 DIAGNOSIS — I10 Essential (primary) hypertension: Secondary | ICD-10-CM

## 2014-01-14 HISTORY — DX: Chest pain, unspecified: R07.9

## 2014-01-14 NOTE — Progress Notes (Signed)
PCP: Annye Asa, MD  Clinic Note: Chief Complaint  Patient presents with  . Annual Exam    no chest pain , sob , no edema    HPI: April Combs is a 68 y.o. female with a PMH below who presents today for one year follow history of known ischemic cardiomyopathy with exertional dyspnea and chest discomfort today.  Past Medical History  Diagnosis Date  . Depression   . Hyperlipidemia   . Hypertension   . GERD (gastroesophageal reflux disease)   . NICM (nonischemic cardiomyopathy)     Essentially resolved. Echocardiogram July 2013 showed normal EF greater than 55%. Important septal motion. Normal LV filling pressures. Mild MR and mild anterior MVP  . Obesity (BMI 30-39.9)     Prior Cardiac Evaluation and Procedure History: Past Surgical History  Procedure Laterality Date  . Gastric bypass  1980  . Exercise tolerance test - cpet-met-test  July 2013    Submaximal effort. Only 80% of her, therefore peak VO2 of 75% is likely an underestimate. High resting heart rate, achieving 86% of heart rate. --> Unable to interpret due to lack of effort.  . Pulmonary function tests  July 2013    Increased densities, decreased FVC - consistent with obesity hypoventilation; FEV1 is 58%, FVC 60% of predicted.  [name] [facility] myoview ltd  ~2011    No ischemia, no infarct  . Doppler echocardiography  July 2013    09/20/11. normal Lv thickness and function with EF greater thatn 55%   Interval History: she presents today, and on initial evaluation seemed to be doing "okay, however a further questioning she started coming up with some symptoms that she's been having. Most notably in his episodes of chest tightness going into the neck and left arm. This will happen sometimes at rest multiple times occurs with walking. She can't tell if it is exacerbated by walking or if it comes on with walking. He associates with some dyspnea. She has baseline exertional dyspnea, but nothing at rest unless associated with  the chest tightness mentioned above. She occasionally notes brief episodes of fast heart beating lasting maybe less than a minute or 2. She denies any syncope or presyncope type symptoms with rapid heartbeats. No TIA/amaurosis fugax. She also is occasional lower extremity swelling, but denies PND or orthopnea.  ROS: A comprehensive was performed. Review of Systems  Constitutional: Positive for malaise/fatigue. Negative for weight loss.  HENT: Negative for congestion and nosebleeds.   Respiratory: Positive for shortness of breath. Negative for cough and wheezing.   Cardiovascular: Positive for palpitations. Negative for orthopnea, leg swelling and PND.  Gastrointestinal: Negative for blood in stool and melena.  Genitourinary: Negative for hematuria.  Musculoskeletal: Positive for joint pain. Negative for myalgias and falls.  Neurological: Positive for dizziness. Negative for tremors, sensory change, speech change, focal weakness, seizures and loss of consciousness.       She takes meclizine  Endo/Heme/Allergies: Negative.   Psychiatric/Behavioral: Negative for depression. The patient is not nervous/anxious.   All other systems reviewed and are negative.   Current Outpatient Prescriptions on File Prior to Visit  Medication Sig Dispense Refill  . atorvastatin (LIPITOR) 20 MG tablet Take 1 tablet (20 mg total) by mouth daily. 90 tablet 3  . carvedilol (COREG) 12.5 MG tablet Take 1 tablet (12.5 mg total) by mouth 2 (two) times daily with a meal. 180 tablet 3  . fenofibrate 160 MG tablet TAKE ONE TABLET BY MOUTH ONCE DAILY 90 tablet 1  .  lisinopril-hydrochlorothiazide (PRINZIDE,ZESTORETIC) 20-25 MG per tablet Take 1 tablet by mouth daily. 90 tablet 3  . meclizine (ANTIVERT) 50 MG tablet Take 1 tablet (50 mg total) by mouth 3 (three) times daily as needed for dizziness. 60 tablet 3  . omeprazole (PRILOSEC) 40 MG capsule TAKE ONE CAPSULE BY MOUTH ONCE DAILY (Patient taking differently: 2 (two)  times daily. TAKE ONE CAPSULE BY MOUTH ONCE DAILY) 90 capsule 1   No current facility-administered medications on file prior to visit.   ALLERGIES REVIEWED IN EPIC -- no change SOCIAL AND FAMILY HISTORY REVIEWED IN EPIC -- no change  Wt Readings from Last 3 Encounters:  01/14/14 243 lb 12.8 oz (110.587 kg)  09/19/13 245 lb (111.131 kg)  02/28/13 245 lb 8 oz (111.358 kg)    PHYSICAL EXAM BP 110/60 mmHg  Pulse 75  Ht 5' 5"  (1.651 m)  Wt 243 lb 12.8 oz (110.587 kg)  BMI 40.57 kg/m2 General appearance: alert, cooperative, appears stated age, no distress and morbidly obese - truncal obesity Neck: no adenopathy, no carotid bruit, no JVD and supple, symmetrical, trachea midline Lungs: clear to auscultation bilaterally, normal percussion bilaterally and Nonlabored, good movement Heart: normal apical impulse, regular rate and rhythm, S1, S2 normal, no S3 or S4 and 1/6 HSM at apex. No click. No R./G. Abdomen: soft, non-tender; bowel sounds normal; no masses, no organomegaly and Morbidly obese; unable to palpate organomegaly Extremities: edema Trace edema. No clubbing or cyanosis. Mild venous stasis changes. Pulses: 2+ and symmetric Neurologic: Grossly normal   Adult ECG Report  Rate: 75 ;  Rhythm: normal sinus rhythm, inferior and lateral ST and T wave abnormality, consider inferolateral ischemia  Narrative Interpretation: no significant change since 2013  Recent Labs:   Lab Results  Component Value Date   CHOL 161 09/19/2013   HDL 58.00 09/19/2013   LDLCALC 74 09/19/2013   LDLDIRECT 96.0 03/20/2012   TRIG 147.0 09/19/2013   CHOLHDL 3 09/19/2013     Chemistry      Component Value Date/Time   NA 138 09/19/2013 1008   K 4.2 09/19/2013 1008   CL 101 09/19/2013 1008   CO2 31 09/19/2013 1008   BUN 23 09/19/2013 1008   CREATININE 0.8 09/19/2013 1008      Component Value Date/Time   CALCIUM 9.5 09/19/2013 1008   ALKPHOS 38* 09/19/2013 1008   AST 22 09/19/2013 1008   ALT 16  09/19/2013 1008   BILITOT 0.6 09/19/2013 1008      ASSESSMENT / PLAN: Chest pain on exertion Chest discomfort as a moderate risk of coronary disease based on her history of hypertension and dyslipidemia. This is a symptom that she had not previously noted. She is having exertional dyspnea in the past but not complained of chest tightness or pressure.  Plan: Exercise Myoview/Cardiolite would be a potential switch over to [facility] if she is unable to achieve target heart rate. She can hold carvedilol the night before and morning of the test.  DOE (dyspnea on exertion) As previously noted, probably multifactorial due to deconditioning, obesity with possible recent evaluation syndrome.  Previous evaluations have proven negative. However now with the episodes of chest discomfort on exertion, it is reasonable to pursue an ischemic evaluation with a nuclear stress test.  Essential hypertension Well-controlled on current medications. No changes  Hyperlipidemia with target LDL less than 100 Doing well on statin and fenofibrate. Unable to exercise. Her medications were recently adjusted by PCP in July. Most recent labs showed well-controlled lipids.  Irregular heart beat No significant sensation of palpitations currently on beta blocker dose.  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 Discussed importance of weight loss with heart healthy diet and trying to increase exercise.    Orders Placed This Encounter  Procedures  . Myocardial Perfusion Imaging    Standing Status: Future     Number of Occurrences:      Standing Expiration Date: 01/14/2015    Order Specific Question:  Where should this test be performed    Answer:  [facility]    Order Specific Question:  Type of stress    Answer:  Exercise    Order Specific Question:  Patient weight in lbs    Answer:  243  . EKG  12-Lead   No orders of the defined types were placed in this encounter.    Followup: 3 months   Rally Ouch W, M.D., M.S. Interventional Cardiologist   Pager # [phone]

## 2014-01-14 NOTE — Assessment & Plan Note (Addendum)
Doing well on statin and fenofibrate. Unable to exercise. Her medications were recently adjusted by PCP in July. Most recent labs showed well-controlled lipids.

## 2014-01-14 NOTE — Assessment & Plan Note (Signed)
As previously noted, probably multifactorial due to deconditioning, obesity with possible recent evaluation syndrome.  Previous evaluations have proven negative. However now with the episodes of chest discomfort on exertion, it is reasonable to pursue an ischemic evaluation with a nuclear stress test.

## 2014-01-14 NOTE — Assessment & Plan Note (Signed)
Chest discomfort as a moderate risk of coronary disease based on her history of hypertension and dyslipidemia. This is a symptom that she had not previously noted. She is having exertional dyspnea in the past but not complained of chest tightness or pressure.  Plan: Exercise Myoview/Cardiolite would be a potential switch over to [facility] if she is unable to achieve target heart rate. She can hold carvedilol the night before and morning of the test.

## 2014-01-14 NOTE — Assessment & Plan Note (Signed)
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 Discussed importance of weight loss with heart healthy diet and trying to increase exercise.

## 2014-01-14 NOTE — Assessment & Plan Note (Signed)
No significant sensation of palpitations currently on beta blocker dose.

## 2014-01-14 NOTE — Assessment & Plan Note (Signed)
Well-controlled on current medications. No changes

## 2014-01-14 NOTE — Patient Instructions (Signed)
Your physician has requested that you have en exercise stress myoview. For further information please visit [email or web address]. Please follow instruction sheet, as given.  HOLD CARVEDILOL THE NIGHT BEFORE AND THE MORNING  OF THE TEST.  Your physician wants you to follow-up in [place].  You will receive a reminder letter in the mail two months in advance. If you don't receive a letter, please call our office to schedule the follow-up appointment.

## 2014-01-22 ENCOUNTER — Telehealth (HOSPITAL_COMMUNITY): Payer: Self-pay

## 2014-01-22 NOTE — Telephone Encounter (Signed)
Encounter complete. 

## 2014-01-24 ENCOUNTER — Ambulatory Visit (HOSPITAL_COMMUNITY)
Admission: RE | Admit: 2014-01-24 | Discharge: 2014-01-24 | Disposition: A | Discharge status: Home / Self Care | Payer: Medicare Other | Source: Ambulatory Visit | Attending: Cardiology | Admitting: Cardiology

## 2014-01-24 DIAGNOSIS — Z8249 Family history of ischemic heart disease and other diseases of the circulatory system: Secondary | ICD-10-CM | POA: Diagnosis not present

## 2014-01-24 DIAGNOSIS — E669 Obesity, unspecified: Secondary | ICD-10-CM | POA: Insufficient documentation

## 2014-01-24 DIAGNOSIS — E785 Hyperlipidemia, unspecified: Secondary | ICD-10-CM | POA: Diagnosis not present

## 2014-01-24 DIAGNOSIS — R079 Chest pain, unspecified: Secondary | ICD-10-CM | POA: Diagnosis not present

## 2014-01-24 DIAGNOSIS — Z87891 Personal history of nicotine dependence: Secondary | ICD-10-CM | POA: Diagnosis not present

## 2014-01-24 DIAGNOSIS — R002 Palpitations: Secondary | ICD-10-CM | POA: Insufficient documentation

## 2014-01-24 DIAGNOSIS — I10 Essential (primary) hypertension: Secondary | ICD-10-CM | POA: Diagnosis not present

## 2014-01-24 DIAGNOSIS — R0609 Other forms of dyspnea: Secondary | ICD-10-CM | POA: Diagnosis not present

## 2014-01-24 DIAGNOSIS — R42 Dizziness and giddiness: Secondary | ICD-10-CM | POA: Insufficient documentation

## 2014-01-24 MED ORDER — TECHNETIUM TC 99M SESTAMIBI GENERIC - CARDIOLITE
30.0000 | Freq: Once | INTRAVENOUS | Status: AC | PRN
  Administered 2014-01-24: 30 via INTRAVENOUS

## 2014-01-24 MED ORDER — TECHNETIUM TC 99M SESTAMIBI GENERIC - CARDIOLITE
10.0000 | Freq: Once | INTRAVENOUS | Status: AC | PRN
  Administered 2014-01-24: 10 via INTRAVENOUS

## 2014-01-24 NOTE — Procedures (Addendum)
[facility] [place] [facility] [address] [place] [address] [phone]  Cardiology Nuclear Med Study  April Combs is a 68 y.o. female     MRN : [id number]     DOB: Oct 25, 1945  Procedure Date: 01/24/2014  Nuclear Med Background Indication for Stress Test:  Evaluation for Ischemia and Abnormal EKG History:  MVP and No prior NUC MPI fo rcomparison;09/2011-MET test-unable to interpret;NICM Cardiac Risk Factors: Family History - CAD, History of Smoking, Hypertension, Lipids and Obesity  Symptoms:  Chest Pain, Dizziness, DOE and Palpitations   Nuclear Pre-Procedure Caffeine/Decaff Intake:  1:00am NPO After: 11am   IV Site: R Forearm  IV 0.9% NS with Angio Cath:  22g  Chest Size (in):  n/a IV Started by: Rolene Course, RN  Height: _0  (1.651 m)  Cup Size: D  BMI:  Body mass index is 40.44 kg/(m^2). Weight:  243 lb (110.224 kg)   Tech Comments:  n/a    Nuclear Med Study 1 or 2 day study: 1 day  Stress Test Type:  Stress  Order Authorizing Provider:  Glenetta Hew, MD   Resting Radionuclide: Technetium 43mSestamibi  Resting Radionuclide Dose: 10.1 mCi   Stress Radionuclide:  Technetium 919mestamibi  Stress Radionuclide Dose: 30.9 mCi           Stress Protocol Rest HR: 93 Stress HR: 139  Rest BP: 143/93 Stress BP: 169/94  Exercise Time (min): 4:20 METS: 4.60   Predicted Max HR: 153 bpm % Max HR: 90.85 bpm Rate Pressure Product: 23491  Dose of Adenosine (mg):  n/a Dose of Lexiscan: n/a mg  Dose of Atropine (mg): n/a Dose of Dobutamine: n/a mcg/kg/min (at max HR)  Stress Test Technologist: GwMellody MemosCCT Nuclear Technologist: RoOtho PerlCNMT   Rest Procedure:  Myocardial perfusion imaging was performed at rest 45 minutes following the intravenous administration of Technetium 9972mstamibi. Stress Procedure:  The patient performed treadmill exercise using a Bruce  Protocol for 4 minutes 20 seconds. The patient  stopped due to exertional shortness of breath and fatigue. Patient denied any chest pain.  There were  significant ST-T wave changes.  Technetium 37m68mtamibi was injected IV at peak exercise and myocardial perfusion imaging was performed after a brief delay.  Transient Ischemic Dilatation (Normal <1.22):  1.02 LV Ejection Fraction: Study not gated        Rest ECG: NSR, occasional PVC, inferior lateral TWI.  Stress ECG: No significant ST segment change suggestive of ischemia; frequent PVCs and occasional NSVT (longest 6 beats).  QPS Raw Data Images:  Acquisition technically good; normal left ventricular size. Stress Images:  There is decreased uptake in the inferior wall. Rest Images:  There is decreased uptake in the inferior wall, less prominent compared to the stress images. Subtraction (SDS):  These findings are consistent with inferior thinning and mild inferior ischemia.  Impression Exercise Capacity:  Poor exercise capacity. BP Response:  Normal blood pressure response. Clinical Symptoms:  There is dyspnea. ECG Impression:  No significant ST segment change suggestive of ischemia; frequent PVCs and occasional runs of NSVT (longest 6 beats). Comparison with Prior Nuclear Study: Compared to 11/12/11, inferior ischemia is new.  Overall Impression:  Low risk stress nuclear study with a moderate size, medium intensity, partially reversible inferior defect consistent with inferior thinning and mild inferior ischemia. Patient with short runs of NSVT; patient instructed by Dr KellClaiborne Billingsresume beta blocker; FU scheduled with Dr HardEllyn HackV  Wall Motion:  Study not gated.   Kirk Ruths, MD  01/24/2014 3:05 PM

## 2014-01-28 ENCOUNTER — Telehealth: Payer: Self-pay | Admitting: *Deleted

## 2014-01-28 ENCOUNTER — Encounter: Payer: Self-pay | Admitting: Cardiology

## 2014-01-28 ENCOUNTER — Ambulatory Visit (INDEPENDENT_AMBULATORY_CARE_PROVIDER_SITE_OTHER): Payer: Medicare Other | Admitting: Cardiology

## 2014-01-28 VITALS — BP 120/64 | HR 76 | Ht 65.0 in | Wt 243.1 lb

## 2014-01-28 DIAGNOSIS — R079 Chest pain, unspecified: Secondary | ICD-10-CM | POA: Diagnosis not present

## 2014-01-28 DIAGNOSIS — R931 Abnormal findings on diagnostic imaging of heart and coronary circulation: Secondary | ICD-10-CM

## 2014-01-28 DIAGNOSIS — Z01818 Encounter for other preprocedural examination: Secondary | ICD-10-CM | POA: Diagnosis not present

## 2014-01-28 DIAGNOSIS — D689 Coagulation defect, unspecified: Secondary | ICD-10-CM

## 2014-01-28 DIAGNOSIS — E785 Hyperlipidemia, unspecified: Secondary | ICD-10-CM

## 2014-01-28 DIAGNOSIS — I499 Cardiac arrhythmia, unspecified: Secondary | ICD-10-CM

## 2014-01-28 DIAGNOSIS — I10 Essential (primary) hypertension: Secondary | ICD-10-CM

## 2014-01-28 DIAGNOSIS — R9439 Abnormal result of other cardiovascular function study: Secondary | ICD-10-CM | POA: Insufficient documentation

## 2014-01-28 DIAGNOSIS — I208 Other forms of angina pectoris: Secondary | ICD-10-CM | POA: Diagnosis not present

## 2014-01-28 DIAGNOSIS — I209 Angina pectoris, unspecified: Secondary | ICD-10-CM | POA: Insufficient documentation

## 2014-01-28 HISTORY — DX: Abnormal result of other cardiovascular function study: R94.39

## 2014-01-28 LAB — BASIC METABOLIC PANEL
BUN: 19 mg/dL (ref 6–23)
CALCIUM: 9.9 mg/dL (ref 8.4–10.5)
CO2: 26 mEq/L (ref 19–32)
Chloride: 103 mEq/L (ref 96–112)
Creat: 0.87 mg/dL (ref 0.50–1.10)
Glucose, Bld: 107 mg/dL — ABNORMAL HIGH (ref 70–99)
Potassium: 4.4 mEq/L (ref 3.5–5.3)
Sodium: 141 mEq/L (ref 135–145)

## 2014-01-28 LAB — CBC
HCT: 39.6 % (ref 36.0–46.0)
HEMOGLOBIN: 13.6 g/dL (ref 12.0–15.0)
MCH: 30.6 pg (ref 26.0–34.0)
MCHC: 34.3 g/dL (ref 30.0–36.0)
MCV: 89 fL (ref 78.0–100.0)
MPV: 11.4 fL (ref 9.4–12.4)
Platelets: 231 10*3/uL (ref 150–400)
RBC: 4.45 MIL/uL (ref 3.87–5.11)
RDW: 13.4 % (ref 11.5–15.5)
WBC: 6.9 10*3/uL (ref 4.0–10.5)

## 2014-01-28 MED ORDER — NITROGLYCERIN 0.4 MG SL SUBL: 0.4000 mg | SUBLINGUAL_TABLET | SUBLINGUAL | Status: DC | PRN

## 2014-01-28 NOTE — Progress Notes (Signed)
PCP: Annye Asa, MD  Clinic Note: Chief Complaint  Patient presents with  . Follow-up    RESULTS OF MYOVIEW, CHEST TIGHTNESS & SOB WITH EXERTION, NO EDEMA    HPI: April Combs is a 68 y.o. female with a PMH below who presents today for follow up of her recent TM Myoview ordered for exertional chest tightness - moderate risk of cardiac etiology. She was seen on 11/2 and noted having more frequent episodes of exertional tightness over the past few months in addition to her known exertional dyspnea.  Past Medical History  Diagnosis Date  . Depression   . Hyperlipidemia   . Hypertension   . GERD (gastroesophageal reflux disease)   . NICM (nonischemic cardiomyopathy)     Essentially resolved. Echocardiogram July 2013 showed normal EF greater than 55%. Important septal motion. Normal LV filling pressures. Mild MR and mild anterior MVP  . Obesity (BMI 30-39.9)    TM Myoview: 4:20 min, 4.6 METS --> DOE, but no chest discomfort; Significant + ST -T wave changes noted with NSVT & PVCs. Images suggest Inferior-inferolateral ischemia.  Low Risk.  Interval History:  Today, she does note that her chest tightness episodes are occuring more frequently - and with less activity -- she admits that having been recalled so quickly for her ST results has made her quite anxious.  No PND/orthopnea or notable edema.    She stll has baseline exertional dyspnea, but nothing at rest unless associated with the chest tightness mentioned above.  She also notes intermittent brief episodes of fast heart beating lasting maybe less than a minute or 2. She denies any syncope or presyncope type symptoms with rapid heartbeats. No TIA/amaurosis fugax. She also is occasional lower extremity swelling, but denies PND or orthopnea.  ROS: A comprehensive was performed. Review of Systems  Constitutional: Positive for malaise/fatigue. Negative for weight loss.  HENT: Negative for congestion and nosebleeds.     Respiratory: Positive for shortness of breath. Negative for cough and wheezing.   Cardiovascular: Positive for palpitations. Negative for orthopnea, leg swelling and PND.  Gastrointestinal: Negative for blood in stool and melena.  Genitourinary: Negative for hematuria.  Musculoskeletal: Positive for joint pain. Negative for myalgias and falls.  Neurological: Positive for dizziness. Negative for tremors, sensory change, speech change, focal weakness, seizures and loss of consciousness.       She takes meclizine  Endo/Heme/Allergies: Negative.   Psychiatric/Behavioral: Negative for depression. The patient is not nervous/anxious.   All other systems reviewed and are negative.   Current Outpatient Prescriptions on File Prior to Visit  Medication Sig Dispense Refill  . atorvastatin (LIPITOR) 20 MG tablet Take 1 tablet (20 mg total) by mouth daily. 90 tablet 3  . carvedilol (COREG) 12.5 MG tablet Take 1 tablet (12.5 mg total) by mouth 2 (two) times daily with a meal. 180 tablet 3  . fenofibrate 160 MG tablet TAKE ONE TABLET BY MOUTH ONCE DAILY 90 tablet 1  . lisinopril-hydrochlorothiazide (PRINZIDE,ZESTORETIC) 20-25 MG per tablet Take 1 tablet by mouth daily. 90 tablet 3  . meclizine (ANTIVERT) 50 MG tablet Take 1 tablet (50 mg total) by mouth 3 (three) times daily as needed for dizziness. 60 tablet 3  . omeprazole (PRILOSEC) 40 MG capsule TAKE ONE CAPSULE BY MOUTH ONCE DAILY (Patient taking differently: 2 (two) times daily. TAKE ONE CAPSULE BY MOUTH ONCE DAILY) 90 capsule 1   No current facility-administered medications on file prior to visit.   ALLERGIES REVIEWED IN EPIC --  no change SOCIAL AND FAMILY HISTORY REVIEWED IN EPIC -- no change  Wt Readings from Last 3 Encounters:  01/28/14 243 lb 1.6 oz (110.269 kg)  01/24/14 243 lb (110.224 kg)  01/14/14 243 lb 12.8 oz (110.587 kg)   PHYSICAL EXAM BP 120/64 mmHg  Pulse 76  Ht 5\' 5"  (1.651 m)  Wt 243 lb 1.6 oz (110.269 kg)  BMI 40.45  kg/m2 General appearance: alert, cooperative, appears stated age, no distress and morbidly obese - truncal obesity Neck: no adenopathy, no carotid bruit, no JVD and supple, symmetrical, trachea midline Lungs: CTAB, normal percussion bilaterally and Nonlabored, good movement Heart: normal apical impulse, RRR , S1&S2 normal, no S3 or S4 and 1/6 HSM at apex. No click. No R./G. Abdomen: soft, non-tender; bowel sounds normal; no masses, no organomegaly and Morbidly obese; unable to palpate organomegaly Extremities: edema Trace edema. No clubbing or cyanosis. Mild venous stasis changes. Pulses: 2+ and symmetric Neurologic: Grossly normal   Adult ECG Report - not performed  Recent Labs:  Reviewed.  None since last visit.  ASSESSMENT / PLAN: Angina, class III I am concerned with her worsening symptoms in the setting of an abnormal stress test.  I am going to consider her now worsening symptoms to be Class III exertional angina -- with her symptoms progressing, we discussed titrating medical therapy vs. Invasive evaluation.   She and her husband are quite familiar with cardiac catheterization & would prefer to "answer the question".  She is on a beta blocker & I am starting PRN NTG.  On statin & starting ASA 81 mg.  Plan: LHC/Angiography +/- PCI this Friday.  Abnormal nuclear stress test I personally reviewed the stress test images & I do agree that there does appear to be reversibility in the inferolateral wall.  With NSVT & ectopy, I am concerned for Circumflex vs. RPL disease.    Chest pain on exertion Now in setting of abnormal ST - will consdier Class II -III angina. But with worsening Sx, now more like Class III angina.  Irregular heart beat Better ON BB - NSVT & PVCs on exertion -- the NSVT episodes with not very high level exertion, concerning for ischemic component.  Hyperlipidemia with target LDL less than 100 On Statin & Fibrate. Previously well controlled  Essential  hypertension Well controlled.    Orders Placed This Encounter  Procedures  . Basic metabolic panel  . CBC  . Protime-INR  . APTT  . LEFT HEART CATHETERIZATION WITH CORONARY ANGIOGRAM   Meds ordered this encounter  Medications  . nitroGLYCERIN (NITROSTAT) 0.4 MG SL tablet    Sig: Place 1 tablet (0.4 mg total) under the tongue every 5 (five) minutes as needed for chest pain.    Dispense:  100 tablet    Refill:  1    100 tablet bottle    Followup: 4 weeks - as post-cath   Leonie Man, M.D., M.S. Interventional Cardiologist   Pager # [phone]

## 2014-01-28 NOTE — Assessment & Plan Note (Signed)
On Statin & Fibrate. Previously well controlled

## 2014-01-28 NOTE — Assessment & Plan Note (Signed)
I am concerned with her worsening symptoms in the setting of an abnormal stress test.  I am going to consider her now worsening symptoms to be Class III exertional angina -- with her symptoms progressing, we discussed titrating medical therapy vs. Invasive evaluation.   She and her husband are quite familiar with cardiac catheterization & would prefer to "answer the question".  She is on a beta blocker & I am starting PRN NTG.  On statin & starting ASA 81 mg.  Plan: LHC/Angiography +/- PCI this Friday.

## 2014-01-28 NOTE — Assessment & Plan Note (Signed)
Better ON BB - NSVT & PVCs on exertion -- the NSVT episodes with not very high level exertion, concerning for ischemic component.

## 2014-01-28 NOTE — Patient Instructions (Addendum)
Your physician has requested that you have a cardiac catheterization. Cardiac catheterization is used to diagnose and/or treat various heart conditions. Doctors may recommend this procedure for a number of different reasons. The most common reason is to evaluate chest pain. Chest pain can be a symptom of coronary artery disease (CAD), and cardiac catheterization can show whether plaque is narrowing or blocking your heart's arteries. This procedure is also used to evaluate the valves, as well as measure the blood flow and oxygen levels in different parts of your heart. For further information please visit [email or web address]. Please follow instruction sheet, as given.  Right Radial access with Dr. Ellyn Hack  Complete blood work today.  In 2 weeks (Early December) please make a follow up appointment with Dr. Ellyn Hack (30 minutes).  An prescription for Nitroglycerin (100 tablets) has been sent to your local pharmacy.

## 2014-01-28 NOTE — Assessment & Plan Note (Signed)
Well controlled 

## 2014-01-28 NOTE — Assessment & Plan Note (Signed)
Now in setting of abnormal ST - will consdier Class II -III angina. But with worsening Sx, now more like Class III angina.

## 2014-01-28 NOTE — Telephone Encounter (Signed)
Left message for pt to call back. When she calls back she needs to be advised to begin ASA 81mg  today and continue this daily. It is ok to also take the ASA 81mg  on the morning of her procedure.

## 2014-01-28 NOTE — Assessment & Plan Note (Signed)
I personally reviewed the stress test images & I do agree that there does appear to be reversibility in the inferolateral wall.  With NSVT & ectopy, I am concerned for Circumflex vs. RPL disease.

## 2014-01-28 NOTE — Telephone Encounter (Signed)
Pt called back in and advised her to begin ASA 81mg  and to be sure to also take morning of procedure. She voiced understanding.

## 2014-01-29 ENCOUNTER — Encounter: Payer: Self-pay | Admitting: Cardiology

## 2014-01-29 LAB — PROTIME-INR
INR: 1.08 (ref <1.50)
PROTHROMBIN TIME: 14 s (ref 11.6–15.2)

## 2014-01-29 LAB — APTT: aPTT: 32 seconds (ref 24–37)

## 2014-01-30 ENCOUNTER — Telehealth: Payer: Self-pay | Admitting: *Deleted

## 2014-01-30 NOTE — Telephone Encounter (Signed)
Spoke to patient. Result given . Verbalized understanding  

## 2014-01-30 NOTE — Telephone Encounter (Signed)
Open in error

## 2014-01-30 NOTE — Telephone Encounter (Signed)
-----   Message from Leonie Man, MD sent at 01/29/2014  7:45 PM EST ----- Labs all look good for cath.  Leonie Man, MD

## 2014-02-01 ENCOUNTER — Ambulatory Visit (HOSPITAL_COMMUNITY)
Admission: RE | Admit: 2014-02-01 | Discharge: 2014-02-01 | Disposition: A | Discharge status: Home / Self Care | Payer: Medicare Other | Source: Ambulatory Visit | Attending: Cardiology | Admitting: Cardiology

## 2014-02-01 ENCOUNTER — Encounter (HOSPITAL_COMMUNITY)
Admission: RE | Disposition: A | Discharge status: Home / Self Care | Payer: Self-pay | Source: Ambulatory Visit | Attending: Cardiology

## 2014-02-01 DIAGNOSIS — I429 Cardiomyopathy, unspecified: Secondary | ICD-10-CM | POA: Diagnosis not present

## 2014-02-01 DIAGNOSIS — E785 Hyperlipidemia, unspecified: Secondary | ICD-10-CM | POA: Diagnosis not present

## 2014-02-01 DIAGNOSIS — F329 Major depressive disorder, single episode, unspecified: Secondary | ICD-10-CM | POA: Insufficient documentation

## 2014-02-01 DIAGNOSIS — K219 Gastro-esophageal reflux disease without esophagitis: Secondary | ICD-10-CM | POA: Diagnosis not present

## 2014-02-01 DIAGNOSIS — E1159 Type 2 diabetes mellitus with other circulatory complications: Secondary | ICD-10-CM | POA: Diagnosis present

## 2014-02-01 DIAGNOSIS — R0789 Other chest pain: Secondary | ICD-10-CM | POA: Diagnosis present

## 2014-02-01 DIAGNOSIS — I209 Angina pectoris, unspecified: Secondary | ICD-10-CM | POA: Diagnosis present

## 2014-02-01 DIAGNOSIS — E669 Obesity, unspecified: Secondary | ICD-10-CM | POA: Insufficient documentation

## 2014-02-01 DIAGNOSIS — Q250 Patent ductus arteriosus: Secondary | ICD-10-CM | POA: Diagnosis not present

## 2014-02-01 DIAGNOSIS — I10 Essential (primary) hypertension: Secondary | ICD-10-CM | POA: Diagnosis present

## 2014-02-01 DIAGNOSIS — I208 Other forms of angina pectoris: Secondary | ICD-10-CM

## 2014-02-01 DIAGNOSIS — R931 Abnormal findings on diagnostic imaging of heart and coronary circulation: Secondary | ICD-10-CM

## 2014-02-01 DIAGNOSIS — Z6839 Body mass index (BMI) 39.0-39.9, adult: Secondary | ICD-10-CM | POA: Insufficient documentation

## 2014-02-01 DIAGNOSIS — E1169 Type 2 diabetes mellitus with other specified complication: Secondary | ICD-10-CM | POA: Diagnosis present

## 2014-02-01 DIAGNOSIS — Z01818 Encounter for other preprocedural examination: Secondary | ICD-10-CM

## 2014-02-01 DIAGNOSIS — R9439 Abnormal result of other cardiovascular function study: Secondary | ICD-10-CM | POA: Diagnosis present

## 2014-02-01 DIAGNOSIS — R079 Chest pain, unspecified: Secondary | ICD-10-CM | POA: Diagnosis present

## 2014-02-01 DIAGNOSIS — J439 Emphysema, unspecified: Secondary | ICD-10-CM | POA: Diagnosis present

## 2014-02-01 HISTORY — PX: LEFT HEART CATHETERIZATION WITH CORONARY ANGIOGRAM: SHX5451

## 2014-02-01 SURGERY — LEFT HEART CATHETERIZATION WITH CORONARY ANGIOGRAM
Anesthesia: LOCAL

## 2014-02-01 MED ORDER — SODIUM CHLORIDE 0.9 % IV SOLN
INTRAVENOUS | Status: DC
  Administered 2014-02-01: 08:00:00 via INTRAVENOUS

## 2014-02-01 MED ORDER — NITROGLYCERIN 1 MG/10 ML FOR IR/CATH LAB
INTRA_ARTERIAL | Status: AC
  Filled 2014-02-01: qty 10

## 2014-02-01 MED ORDER — SODIUM CHLORIDE 0.9 % IJ SOLN
3.0000 mL | Freq: Two times a day (BID) | INTRAMUSCULAR | Status: DC
  Administered 2014-02-01: 3 mL via INTRAVENOUS

## 2014-02-01 MED ORDER — FENTANYL CITRATE 0.05 MG/ML IJ SOLN
INTRAMUSCULAR | Status: AC
  Filled 2014-02-01: qty 2

## 2014-02-01 MED ORDER — LIDOCAINE HCL (PF) 1 % IJ SOLN
INTRAMUSCULAR | Status: AC
  Filled 2014-02-01: qty 30

## 2014-02-01 MED ORDER — MIDAZOLAM HCL 2 MG/2ML IJ SOLN
INTRAMUSCULAR | Status: AC
  Filled 2014-02-01: qty 2

## 2014-02-01 MED ORDER — HEPARIN (PORCINE) IN NACL 2-0.9 UNIT/ML-% IJ SOLN
INTRAMUSCULAR | Status: AC
  Filled 2014-02-01: qty 1000

## 2014-02-01 MED ORDER — HEPARIN SODIUM (PORCINE) 1000 UNIT/ML IJ SOLN
INTRAMUSCULAR | Status: AC
  Filled 2014-02-01: qty 1

## 2014-02-01 MED ORDER — VERAPAMIL HCL 2.5 MG/ML IV SOLN
INTRAVENOUS | Status: AC
  Filled 2014-02-01: qty 2

## 2014-02-01 NOTE — Discharge Instructions (Signed)
Radial Site Care °Refer to this sheet in the next few weeks. These instructions provide you with information on caring for yourself after your procedure. Your caregiver may also give you more specific instructions. Your treatment has been planned according to current medical practices, but problems sometimes occur. Call your caregiver if you have any problems or questions after your procedure. °HOME CARE INSTRUCTIONS °· You may shower the day after the procedure. Remove the bandage (dressing) and gently wash the site with plain soap and water. Gently pat the site dry. °· Do not apply powder or lotion to the site. °· Do not submerge the affected site in water for 3 to 5 days. °· Inspect the site at least twice daily. °· Do not flex or bend the affected arm for 24 hours. °· No lifting over 5 pounds (2.3 kg) for 5 days after your procedure. °· Do not drive home if you are discharged the same day of the procedure. Have someone else drive you. °· You may drive 24 hours after the procedure unless otherwise instructed by your caregiver. °· Do not operate machinery or power tools for 24 hours. °· A responsible adult should be with you for the first 24 hours after you arrive home. °What to expect: °· Any bruising will usually fade within 1 to 2 weeks. °· Blood that collects in the tissue (hematoma) may be painful to the touch. It should usually decrease in size and tenderness within 1 to 2 weeks. °SEEK IMMEDIATE MEDICAL CARE IF: °· You have unusual pain at the radial site. °· You have redness, warmth, swelling, or pain at the radial site. °· You have drainage (other than a small amount of blood on the dressing). °· You have chills. °· You have a fever or persistent symptoms for more than 72 hours. °· You have a fever and your symptoms suddenly get worse. °· Your arm becomes pale, cool, tingly, or numb. °· You have heavy bleeding from the site. Hold pressure on the site. °Document Released: 04/03/2010 Document Revised:  05/24/2011 Document Reviewed: 04/03/2010 °Exit[employer]® Patient Information ©2015 [employer]. This information is not intended to replace advice given to you by your health care provider. Make sure you discuss any questions you have with your health care provider. ° °

## 2014-02-01 NOTE — H&P (View-Only) (Signed)
PCP: Annye Asa, MD  Clinic Note: Chief Complaint  Patient presents with  . Follow-up    RESULTS OF MYOVIEW, CHEST TIGHTNESS & SOB WITH EXERTION, NO EDEMA    HPI: April Combs is a 68 y.o. female with a PMH below who presents today for follow up of her recent TM Myoview ordered for exertional chest tightness - moderate risk of cardiac etiology. She was seen on 11/2 and noted having more frequent episodes of exertional tightness over the past few months in addition to her known exertional dyspnea.  Past Medical History  Diagnosis Date  . Depression   . Hyperlipidemia   . Hypertension   . GERD (gastroesophageal reflux disease)   . NICM (nonischemic cardiomyopathy)     Essentially resolved. Echocardiogram July 2013 showed normal EF greater than 55%. Important septal motion. Normal LV filling pressures. Mild MR and mild anterior MVP  . Obesity (BMI 30-39.9)    TM Myoview: 4:20 min, 4.6 METS --> DOE, but no chest discomfort; Significant + ST -T wave changes noted with NSVT & PVCs. Images suggest Inferior-inferolateral ischemia.  Low Risk.  Interval History:  Today, she does note that her chest tightness episodes are occuring more frequently - and with less activity -- she admits that having been recalled so quickly for her ST results has made her quite anxious.  No PND/orthopnea or notable edema.    She stll has baseline exertional dyspnea, but nothing at rest unless associated with the chest tightness mentioned above.  She also notes intermittent brief episodes of fast heart beating lasting maybe less than a minute or 2. She denies any syncope or presyncope type symptoms with rapid heartbeats. No TIA/amaurosis fugax. She also is occasional lower extremity swelling, but denies PND or orthopnea.  ROS: A comprehensive was performed. Review of Systems  Constitutional: Positive for malaise/fatigue. Negative for weight loss.  HENT: Negative for congestion and nosebleeds.     Respiratory: Positive for shortness of breath. Negative for cough and wheezing.   Cardiovascular: Positive for palpitations. Negative for orthopnea, leg swelling and PND.  Gastrointestinal: Negative for blood in stool and melena.  Genitourinary: Negative for hematuria.  Musculoskeletal: Positive for joint pain. Negative for myalgias and falls.  Neurological: Positive for dizziness. Negative for tremors, sensory change, speech change, focal weakness, seizures and loss of consciousness.       She takes meclizine  Endo/Heme/Allergies: Negative.   Psychiatric/Behavioral: Negative for depression. The patient is not nervous/anxious.   All other systems reviewed and are negative.   Current Outpatient Prescriptions on File Prior to Visit  Medication Sig Dispense Refill  . atorvastatin (LIPITOR) 20 MG tablet Take 1 tablet (20 mg total) by mouth daily. 90 tablet 3  . carvedilol (COREG) 12.5 MG tablet Take 1 tablet (12.5 mg total) by mouth 2 (two) times daily with a meal. 180 tablet 3  . fenofibrate 160 MG tablet TAKE ONE TABLET BY MOUTH ONCE DAILY 90 tablet 1  . lisinopril-hydrochlorothiazide (PRINZIDE,ZESTORETIC) 20-25 MG per tablet Take 1 tablet by mouth daily. 90 tablet 3  . meclizine (ANTIVERT) 50 MG tablet Take 1 tablet (50 mg total) by mouth 3 (three) times daily as needed for dizziness. 60 tablet 3  . omeprazole (PRILOSEC) 40 MG capsule TAKE ONE CAPSULE BY MOUTH ONCE DAILY (Patient taking differently: 2 (two) times daily. TAKE ONE CAPSULE BY MOUTH ONCE DAILY) 90 capsule 1   No current facility-administered medications on file prior to visit.   ALLERGIES REVIEWED IN EPIC --  no change SOCIAL AND FAMILY HISTORY REVIEWED IN EPIC -- no change  Wt Readings from Last 3 Encounters:  01/28/14 243 lb 1.6 oz (110.269 kg)  01/24/14 243 lb (110.224 kg)  01/14/14 243 lb 12.8 oz (110.587 kg)   PHYSICAL EXAM BP 120/64 mmHg  Pulse 76  Ht 5\' 5"  (1.651 m)  Wt 243 lb 1.6 oz (110.269 kg)  BMI 40.45  kg/m2 General appearance: alert, cooperative, appears stated age, no distress and morbidly obese - truncal obesity Neck: no adenopathy, no carotid bruit, no JVD and supple, symmetrical, trachea midline Lungs: CTAB, normal percussion bilaterally and Nonlabored, good movement Heart: normal apical impulse, RRR , S1&S2 normal, no S3 or S4 and 1/6 HSM at apex. No click. No R./G. Abdomen: soft, non-tender; bowel sounds normal; no masses, no organomegaly and Morbidly obese; unable to palpate organomegaly Extremities: edema Trace edema. No clubbing or cyanosis. Mild venous stasis changes. Pulses: 2+ and symmetric Neurologic: Grossly normal   Adult ECG Report - not performed  Recent Labs:  Reviewed.  None since last visit.  ASSESSMENT / PLAN: Angina, class III I am concerned with her worsening symptoms in the setting of an abnormal stress test.  I am going to consider her now worsening symptoms to be Class III exertional angina -- with her symptoms progressing, we discussed titrating medical therapy vs. Invasive evaluation.   She and her husband are quite familiar with cardiac catheterization & would prefer to "answer the question".  She is on a beta blocker & I am starting PRN NTG.  On statin & starting ASA 81 mg.  Plan: LHC/Angiography +/- PCI this Friday.  Abnormal nuclear stress test I personally reviewed the stress test images & I do agree that there does appear to be reversibility in the inferolateral wall.  With NSVT & ectopy, I am concerned for Circumflex vs. RPL disease.    Chest pain on exertion Now in setting of abnormal ST - will consdier Class II -III angina. But with worsening Sx, now more like Class III angina.  Irregular heart beat Better ON BB - NSVT & PVCs on exertion -- the NSVT episodes with not very high level exertion, concerning for ischemic component.  Hyperlipidemia with target LDL less than 100 On Statin & Fibrate. Previously well controlled  Essential  hypertension Well controlled.    Orders Placed This Encounter  Procedures  . Basic metabolic panel  . CBC  . Protime-INR  . APTT  . LEFT HEART CATHETERIZATION WITH CORONARY ANGIOGRAM   Meds ordered this encounter  Medications  . nitroGLYCERIN (NITROSTAT) 0.4 MG SL tablet    Sig: Place 1 tablet (0.4 mg total) under the tongue every 5 (five) minutes as needed for chest pain.    Dispense:  100 tablet    Refill:  1    100 tablet bottle    Followup: 4 weeks - as post-cath   Leonie Man, M.D., M.S. Interventional Cardiologist   Pager # [phone]

## 2014-02-01 NOTE — Interval H&P Note (Signed)
History and Physical Interval Note:  02/01/2014 7:54 AM  April Combs  has presented today for surgery, with the diagnosis of : Principal Problem:   Angina, class III Active Problems:   Abnormal nuclear stress test   Essential hypertension   Hyperlipidemia with target LDL less than 100   Chest pain on exertion   DOE (dyspnea on exertion)   The various methods of treatment have been discussed with the patient and family. After consideration of risks, benefits and other options for treatment, the patient has consented to  Procedure(s): LEFT HEART CATHETERIZATION WITH CORONARY ANGIOGRAM (N/A) as a surgical intervention .  The patient's history has been reviewed, patient examined, no change in status, stable for surgery.  I have reviewed the patient's chart and labs.  Questions were answered to the patient's satisfaction.    Cath Lab Visit (complete for each Cath Lab visit)  Clinical Evaluation Leading to the Procedure:   ACS: No.  Non-ACS:    Anginal Classification: CCS III  Anti-ischemic medical therapy: Maximal Therapy (2 or more classes of medications)  Non-Invasive Test Results: Low-risk stress test findings: cardiac mortality <1%/year  Prior CABG: No previous CABG   HARDING,DAVID W

## 2014-02-01 NOTE — CV Procedure (Signed)
CARDIAC CATHETERIZATION REPORT  NAME:  Miquela Costabile   MRN: [id number] DOB:  January 21, 1946   ADMIT DATE: 02/01/2014 Procedure Date: 02/01/2014  INTERVENTIONAL CARDIOLOGIST: Leonie Man, M.D., MS PRIMARY CARE PROVIDER: Annye Asa, MD PRIMARY CARDIOLOGIST: Leonie Man, MD, Ms  PATIENT:  April Combs is a 68 y.o. female history of hypertension, hyperlipidemia and nonischemic myopathy as well as obesity who has been noticing exertional chest tightness and dyspnea. She was evaluated with a Myoview that was low risk but did show evidence of inferior inferolateral ischemia. Significant EKG changes with the run of nonsustained V. tach. Based on the abnormal EKG changes and the patient's symptoms during the test, she is referred for invasive evaluation. Her chest discomfort is qualified as class III angina  PRE-OPERATIVE DIAGNOSIS:    Class III angina  Abnormal Myoview  PROCEDURES PERFORMED:    Left Heart Catheterization with Native Coronary Angiography  via RIGHT RADIAL Artery   Left Ventriculography  PROCEDURE: The patient was brought to the [facility] in the fasting state and prepped and draped in the usual sterile fashion for Right Radial artery access. A modified Allen's test was performed on the right wrist demonstrating excellent collateral flow for radial access.   Sterile technique was used including antiseptics, cap, gloves, gown, hand hygiene, mask and sheet. Skin prep: Chlorhexidine.   Consent: Risks of procedure as well as the alternatives and risks of each were explained to the (patient/caregiver). Consent for procedure obtained.   Time Out: Verified patient identification, verified procedure, site/side was marked, verified correct patient position, special equipment/implants available, medications/allergies/relevent history reviewed, required imaging and test results available. Performed.  Access:   Right Radial Artery:  6 Fr Sheath -  Seldinger Technique (Angiocath Micropuncture Kit)  Radial Cocktail - 10 mL; IV Heparin 5500 Units   Left Heart Catheterization: 5Fr Catheters advanced or exchanged over a long exchange safety J-wire; TIG 4.0 catheter advanced first.  Left & Right Coronary Artery Cineangiography: TIG 4.0 Catheter   LV Hemodynamics (LV Gram): Angled Pigtail  Sheath removed in thecardiac catheterization lab with TR band placement for nonocclusive  hemostasis.  TR Band: 0840 Hours;97m air  FINDINGS:  Hemodynamics:   Central Aortic Pressure / Mean: 112/59/748mg  Left Ventricular Pressure / LVEDP:110/6/18mmHg  Left Ventriculography:  EF:  roughly 55%  Wall Motion: Essentially normal  Coronary Anatomy:  Dominance: Right  Left Main: large-caliber vessel that bifurcates into the LAD and Circumflex; Angiographically normal.  LAD: Normal caliber vessel that tapers down to the apex but does not fully reach the apex. There is a large first diagonal branch that is the same caliber as the LAD itself and a smaller second diagonal branch. Angiographically normal  Left Circumflex: Normal caliber, nondominant vessel that terminates as a lateral marginal branch. There is a small proximal marginal branch and a distal AV groove circumflex. The system is free of disease.   RCA: Very large caliber, dominant vessel that has a distal RV marginal branch that becomes a tandem PDA. There is a smaller distal PDA with a major Right Posterior AV Groove Branch (RPAV).  The RCA essentially terminates as the RPAV that gives off one small and one major posterolateral branch. The 2 tandem PDA is a relatively small in caliber.   MEDICATIONS:  Anesthesia:  Local Lidocaine 2 ml  Sedation:  1 mg IV Versed, 50 mcg IV fentanyl ;   Omnipaque Contrast: 70 ml  Anticoagulation:  IV Heparin 5500 Units  Anti-Platelet Agent:  81 mg ASA Radial Cocktail: 5 mg Verapamil, 400 mcg NTG, 2 ml 2% Lidocaine in 10 ml  NS  PATIENT DISPOSITION:    The patient was transferred to the PACU holding area in a hemodynamicaly stable, chest pain free condition.  The patient tolerated the procedure well, and there were no complications.  EBL:   < 10 ml  The patient was stable before, during, and after the procedure.  POST-OPERATIVE DIAGNOSIS:    Angiographically normal coronary arteries with a right dominant system and small PDA.  Normal EF with normal global wall motion and slightly elevated LVEDP.  False positive nuclear stress test with likely non-anginal chest pain  PLAN OF CARE:  STANDARD POST RADIAL CATH CARE WITH DISCHARGE AFTER TR BAND REMOVAL.  She will follow-up with me as scheduled.   Leonie Man, M.D., M.S. Interventional Cardiologist   Pager # [phone]

## 2014-02-14 ENCOUNTER — Encounter: Payer: Self-pay | Admitting: Cardiology

## 2014-02-14 ENCOUNTER — Ambulatory Visit (INDEPENDENT_AMBULATORY_CARE_PROVIDER_SITE_OTHER): Payer: Medicare Other | Admitting: Cardiology

## 2014-02-14 VITALS — BP 140/82 | HR 77 | Ht 65.0 in | Wt 242.5 lb

## 2014-02-14 DIAGNOSIS — R931 Abnormal findings on diagnostic imaging of heart and coronary circulation: Secondary | ICD-10-CM

## 2014-02-14 DIAGNOSIS — I10 Essential (primary) hypertension: Secondary | ICD-10-CM | POA: Diagnosis not present

## 2014-02-14 DIAGNOSIS — I499 Cardiac arrhythmia, unspecified: Secondary | ICD-10-CM | POA: Diagnosis not present

## 2014-02-14 DIAGNOSIS — I208 Other forms of angina pectoris: Secondary | ICD-10-CM

## 2014-02-14 DIAGNOSIS — E785 Hyperlipidemia, unspecified: Secondary | ICD-10-CM

## 2014-02-14 DIAGNOSIS — R0609 Other forms of dyspnea: Secondary | ICD-10-CM | POA: Diagnosis not present

## 2014-02-14 DIAGNOSIS — R9439 Abnormal result of other cardiovascular function study: Secondary | ICD-10-CM

## 2014-02-14 NOTE — Assessment & Plan Note (Signed)
Thankfully, now being clarified the absence of coronary disease as a potential etiology, she is a little less concerned. I think this is probably again multifactorial with obesity deconditioning and possibly a small component of diastolic dysfunction mediated dyspnea.  Her slit was made what we saw on her CPET test.  Plan: Continued to increase his activity. She is on good dose of beta blocker and ACE inhibitor. We would need to monitor for signs of chronotropic incompetence but it seems more deconditioning than a heart rate issue.[name]

## 2014-02-14 NOTE — Patient Instructions (Signed)
NO CHANGE IN MEDICATIONS  Your physician wants you to follow-up in 12 MONTHS Dr Ellyn Hack.  You will receive a reminder letter in the mail two months in advance. If you don't receive a letter, please call our office to schedule the follow-up appointment.

## 2014-02-14 NOTE — Progress Notes (Signed)
PCP: Annye Asa, MD  Clinic Note: Chief Complaint  Patient presents with  . Follow-up    pt denies chest pain and swelling. pt states that she does experience sob  . Procedure    Followup after catheterization    HPI: April Combs is a 68 y.o. female with a PMH below who presents today for follow up of her recent TM Myoview ordered for exertional chest tightness - moderate risk of cardiac etiology. She was seen on 11/2 and noted having more frequent episodes of exertional tightness over the past few months in addition to her known exertional dyspnea.  Because of her Myoview showing possible  Inferior and inferolateral ischemia, we took her to the cardiac catheterization lab on 02/01/2014 and found in the rectum and normal coronary arteries. Therefore we have excluded coronary disease as an etiology for her dyspnea and chest tightness.  Past Medical History  Diagnosis Date  . Depression   . Hyperlipidemia   . Hypertension   . GERD (gastroesophageal reflux disease)   . NICM (nonischemic cardiomyopathy)     Essentially resolved. Echocardiogram July 2013 showed normal EF greater than 55%. Important septal motion. Normal LV filling pressures. Mild MR and mild anterior MVP  . Coronary artery disease (CAD) excluded November 2015    Low Risk Myoview (false positive) with possible inferior-inferolateral ischemia, but with nonsustained VT --> CATH --> Angiographically Normal Coronary Arteries  . Obesity (BMI 30-39.9)    CARDIAC EVALUATOINS  Procedure Laterality Date  . Exercise tolerance test - cpet-met-test  July 2013    Submaximal effort. Only 80% of her, therefore peak VO2 of 75% is likely an underestimate. High resting heart rate, achieving 86% of heart rate. --> Unable to interpret due to lack of effort.  . Pulmonary function tests  July 2013    Increased densities, decreased FVC - consistent with obesity hypoventilation; FEV1 is 58%, FVC 60% of predicted.  [name] Nm myoview ltd   November 2015    4:20 min, 4.6 METS --> DOE, but no chest discomfort; Significant + ST -T wave changes noted with NSVT & PVCs. Images suggest Inferior-inferolateral ischemia.  Low Risk. -- FALSE POSITIVE  . Colonoscopy    . Doppler echocardiography  July 2013    09/20/11. normal Lv thickness and function with EF greater thatn 55%  . Cardiac catheterization  November 2015    Angiographically normal coronaries   Interval History:  She presents back today  Quite relieved for the results of hercardiac catheterization and happy to know that she does not truly have coronary disease. She had some mild bruising of her wrist after the procedure which is all resolved now. Otherwise no major complaints from that standpoint.   She still has her baseline exertional dyspnea, but nothing at rest unless.  She has not had anymore chest tightness, she really exerts herself. None of these symptoms her breasts. No real palpitations or irregular heartbeats of rapid heartbeats. Just having palpitations that when they occur she notices them and feels a bit dyspneic. She denies any syncope or presyncope type symptoms with rapid heartbeats. No TIA/amaurosis fugax. She also is occasional lower extremity swelling, but denies PND or orthopnea.  ROS: A comprehensive was performed. Review of Systems  Constitutional: Positive for malaise/fatigue. Negative for weight loss.  HENT: Negative for congestion and nosebleeds.   Respiratory: Positive for shortness of breath. Negative for cough and wheezing.   Cardiovascular: Negative for orthopnea and claudication.       Per  history of present illness  Gastrointestinal: Negative for blood in stool and melena.  Genitourinary: Negative for hematuria.  Musculoskeletal: Positive for joint pain. Negative for myalgias and falls.  Neurological: Positive for dizziness. Negative for tremors, sensory change, speech change, focal weakness, seizures and loss of consciousness.       She takes  meclizine  Endo/Heme/Allergies: Negative.   Psychiatric/Behavioral: Negative for depression. The patient is not nervous/anxious.   All other systems reviewed and are negative.   Current Outpatient Prescriptions on File Prior to Visit  Medication Sig Dispense Refill  . atorvastatin (LIPITOR) 20 MG tablet Take 1 tablet (20 mg total) by mouth daily. 90 tablet 3  . carvedilol (COREG) 12.5 MG tablet Take 1 tablet (12.5 mg total) by mouth 2 (two) times daily with a meal. 180 tablet 3  . fenofibrate 160 MG tablet TAKE ONE TABLET BY MOUTH ONCE DAILY 90 tablet 1  . lisinopril-hydrochlorothiazide (PRINZIDE,ZESTORETIC) 20-25 MG per tablet Take 1 tablet by mouth daily. 90 tablet 3  . meclizine (ANTIVERT) 50 MG tablet Take 1 tablet (50 mg total) by mouth 3 (three) times daily as needed for dizziness. 60 tablet 3  . nitroGLYCERIN (NITROSTAT) 0.4 MG SL tablet Place 1 tablet (0.4 mg total) under the tongue every 5 (five) minutes as needed for chest pain. 100 tablet 1  . omeprazole (PRILOSEC) 40 MG capsule TAKE ONE CAPSULE BY MOUTH ONCE DAILY (Patient taking differently: Take 40 mg by mouth daily. TAKE ONE CAPSULE BY MOUTH ONCE DAILY) 90 capsule 1   No current facility-administered medications on file prior to visit.   ALLERGIES REVIEWED IN EPIC -- no change SOCIAL AND FAMILY HISTORY REVIEWED IN EPIC -- no change  Wt Readings from Last 3 Encounters:  02/14/14 242 lb 8 oz (109.997 kg)  01/28/14 243 lb 1.6 oz (110.269 kg)  01/24/14 243 lb (110.224 kg)   PHYSICAL EXAM BP 140/82 mmHg  Pulse 77  Ht _0  (1.651 m)  Wt 242 lb 8 oz (109.997 kg)  BMI 40.35 kg/m2 General appearance: alert, cooperative, appears stated age, no distress and morbidly obese - truncal obesity Neck: no adenopathy, no carotid bruit, no JVD and supple, symmetrical, trachea midline Lungs: CTAB, normal percussion bilaterally and Nonlabored, good movement Heart: normal apical impulse, RRR , S1&S2 normal, no S3 or S4 and 1/6 HSM at  apex. No click. No R./G. Abdomen: soft, non-tender; bowel sounds normal; no masses, no organomegaly and Morbidly obese; unable to palpate organomegaly Extremities: edema Trace edema. No clubbing or cyanosis. Mild venous stasis changes. Pulses: 2+ and symmetric Neurologic: Grossly normal   Adult ECG Report - not performed  Recent Labs:  Reviewed.  None since last visit.  ASSESSMENT / PLAN: DOE (dyspnea on exertion) Thankfully, now being clarified the absence of coronary disease as a potential etiology, she is a little less concerned. I think this is probably again multifactorial with obesity deconditioning and possibly a small component of diastolic dysfunction mediated dyspnea.  Her slit was made what we saw on her CPET test.  Plan: Continued to increase his activity. She is on good dose of beta blocker and ACE inhibitor. We would need to monitor for signs of chronotropic incompetence but it seems more deconditioning than a heart rate issue..  Severe obesity (BMI >= 40) Hopefully now that she is not as concerned about the presence of coronary disease she will try figure out an exercise regimen that will work with her arthritic knees. We also reiterated the importance of a  heart healthy diet along with exercise.  Essential hypertension Slightly elevated today, but has been relatively normal up until now. For now we'll not add any additional medications but just monitor.  Hyperlipidemia with target LDL less than 100 Continued to be stable on fenofibrate plus statin. Her last check from July showed well within goal results.  FALSE POSITIVE NUCLEAR STRESS CHEST Interesting that the test was also be positive. The imaging part was clearly inaccurate, however the presence of the short one of nonsustained ventricular tachycardia he is a bit concerning. At least we know that her macrovascular vessels are intact.    No orders of the defined types were placed in this encounter.   No orders of  the defined types were placed in this encounter.    Followup: one year   Leonie Man, M.D., M.S. Interventional Cardiologist   Pager # [phone]

## 2014-02-14 NOTE — Assessment & Plan Note (Signed)
Slightly elevated today, but has been relatively normal up until now. For now we'll not add any additional medications but just monitor.

## 2014-02-14 NOTE — Assessment & Plan Note (Signed)
Hopefully now that she is not as concerned about the presence of coronary disease she will try figure out an exercise regimen that will work with her arthritic knees. We also reiterated the importance of a heart healthy diet along with exercise.

## 2014-02-14 NOTE — Assessment & Plan Note (Signed)
Interesting that the test was also be positive. The imaging part was clearly inaccurate, however the presence of the short one of nonsustained ventricular tachycardia he is a bit concerning. At least we know that her macrovascular vessels are intact.

## 2014-02-14 NOTE — Assessment & Plan Note (Signed)
Continued to be stable on fenofibrate plus statin. Her last check from July showed well within goal results.

## 2014-02-21 ENCOUNTER — Encounter (HOSPITAL_COMMUNITY): Payer: Self-pay | Admitting: Cardiology

## 2014-03-07 ENCOUNTER — Telehealth: Payer: Self-pay

## 2014-03-07 NOTE — Telephone Encounter (Signed)
C/o:  Left great toe discoloration.  Toenail is brown and toe is discolored.  States it does not hurt, but every now and again it throbs.  She denies recent injury to her toe, but states that for about week she has been wearing "[employer]pper Stockings" to help with her legs at night ("they often feel heavy and jerk a lot at night"). She also started noticing the discoloration in her toe last week.  States there is no discoloration anywhere else and that at the time of call, all toes were cold to touch.  Not just her left great toe.  Advice:  An appointment was offered for today, but pt refused.  She agreed to go to [facility] if her symptoms worsen or new symptoms develop.    Appointment still scheduled for 03/11/14 at 2 pm.

## 2014-03-11 ENCOUNTER — Encounter: Payer: Self-pay | Admitting: Family Medicine

## 2014-03-11 ENCOUNTER — Ambulatory Visit (INDEPENDENT_AMBULATORY_CARE_PROVIDER_SITE_OTHER): Payer: Medicare Other | Admitting: Family Medicine

## 2014-03-11 VITALS — BP 113/55 | HR 69 | Temp 98.1°F | Resp 18 | Ht 65.0 in | Wt 238.4 lb

## 2014-03-11 DIAGNOSIS — K529 Noninfective gastroenteritis and colitis, unspecified: Secondary | ICD-10-CM | POA: Diagnosis not present

## 2014-03-11 DIAGNOSIS — B351 Tinea unguium: Secondary | ICD-10-CM | POA: Diagnosis not present

## 2014-03-11 DIAGNOSIS — I208 Other forms of angina pectoris: Secondary | ICD-10-CM

## 2014-03-11 DIAGNOSIS — E781 Pure hyperglyceridemia: Secondary | ICD-10-CM

## 2014-03-11 MED ORDER — TERBINAFINE HCL 250 MG PO TABS: 250.0000 mg | ORAL_TABLET | Freq: Every day | ORAL | Status: DC

## 2014-03-11 MED ORDER — ONDANSETRON HCL 4 MG PO TABS: 4.0000 mg | ORAL_TABLET | Freq: Three times a day (TID) | ORAL | Status: DC | PRN

## 2014-03-11 NOTE — Progress Notes (Signed)
Pre visit review using our clinic review tool, if applicable. No additional management support is needed unless otherwise documented below in the visit note/SLS  

## 2014-03-11 NOTE — Patient Instructions (Signed)
Schedule a lab visit in 6 weeks to recheck liver functions We'll notify you of your lab results and make any changes if needed Use the Zofran as needed for nausea Drink plenty of fluids REST! Immodium as needed for diarrhea Start the Lamisil daily for the nail fungus Call with any questions or concerns Happy New Year!!!

## 2014-03-11 NOTE — Progress Notes (Signed)
   Subjective:    Patient ID: April Combs, female    DOB: 01-Apr-1945, 68 y.o.   MRN: [id number]  HPI Toe fungus- L great toe has nail fungus but pt became concerned when skin surrounding nail bed turned 'black'.  The discoloration has improved but nail remains thickened and yellowed.  Has tried OTC treatments w/o relief.  N/V/D- woke up yesterday w/ headache, nausea, vomited 'white foam'.  Had watery diarrhea.  Diarrhea x5 today.  No vomiting today.  No fever.  No known sick contacts.  Pt was out running errands prior to holiday.  No abd pain.  HA remains.  Hyperlipidemia- chronic problem, on Lipitor and fenofibrate.  Denies abd pain, until yesterday no N/V, myalgias   Review of Systems For ROS see HPI     Objective:   Physical Exam  Constitutional: She is oriented to person, place, and time. She appears well-developed and well-nourished. No distress.  HENT:  Head: Normocephalic and atraumatic.  MMM  Neck: Neck supple.  Cardiovascular: Normal rate, regular rhythm and intact distal pulses.   Pulmonary/Chest: Effort normal and breath sounds normal. No respiratory distress. She has no wheezes. She has no rales.  Abdominal: Soft. She exhibits no distension. There is no tenderness. There is no rebound.  Hyperactive BS  Lymphadenopathy:    She has no cervical adenopathy.  Neurological: She is alert and oriented to person, place, and time.  Skin: Skin is warm and dry.  Nail fungus on feet bilaterally  Vitals reviewed.         Assessment & Plan:

## 2014-03-12 LAB — BASIC METABOLIC PANEL
BUN: 21 mg/dL (ref 6–23)
CO2: 22 mEq/L (ref 19–32)
CREATININE: 0.7 mg/dL (ref 0.4–1.2)
Calcium: 8.8 mg/dL (ref 8.4–10.5)
Chloride: 105 mEq/L (ref 96–112)
GFR: 83.06 mL/min (ref >60.00)
Glucose, Bld: 98 mg/dL (ref 70–99)
POTASSIUM: 3.9 meq/L (ref 3.5–5.1)
Sodium: 137 mEq/L (ref 135–145)

## 2014-03-12 LAB — LIPID PANEL
CHOLESTEROL: 181 mg/dL (ref 0–200)
HDL: 50.8 mg/dL (ref >39.00)
LDL Cholesterol: 99 mg/dL (ref 0–99)
NonHDL: 130.2
TRIGLYCERIDES: 156 mg/dL — AB (ref 0.0–149.0)
Total CHOL/HDL Ratio: 4
VLDL: 31.2 mg/dL (ref 0.0–40.0)

## 2014-03-12 LAB — HEPATIC FUNCTION PANEL
ALBUMIN: 4 g/dL (ref 3.5–5.2)
ALK PHOS: 58 U/L (ref 39–117)
ALT: 18 U/L (ref 0–35)
AST: 22 U/L (ref 0–37)
Bilirubin, Direct: 0.1 mg/dL (ref 0.0–0.3)
TOTAL PROTEIN: 7.2 g/dL (ref 6.0–8.3)
Total Bilirubin: 0.5 mg/dL (ref 0.2–1.2)

## 2014-03-17 NOTE — Assessment & Plan Note (Signed)
Chronic problem.  Tolerating meds w/o difficulty.  Check labs.  Adjust meds prn  

## 2014-03-17 NOTE — Assessment & Plan Note (Signed)
New.  Pt has tried OTC products w/o success.  Start oral Lamisil.  Get LFTs as baseline today and repeat in 6 weeks.  Pt expressed understanding and is in agreement w/ plan.

## 2014-03-17 NOTE — Assessment & Plan Note (Signed)
New.  Suspect viral illness as PE WNL.  Start zofran and immodium prn.  Reviewed supportive care and red flags that should prompt return.  Pt expressed understanding and is in agreement w/ plan.

## 2014-03-25 ENCOUNTER — Encounter: Payer: Self-pay | Admitting: Internal Medicine

## 2014-04-19 ENCOUNTER — Ambulatory Visit: Payer: Medicare Other | Admitting: Cardiology

## 2014-04-22 ENCOUNTER — Other Ambulatory Visit (INDEPENDENT_AMBULATORY_CARE_PROVIDER_SITE_OTHER): Payer: Medicare Other

## 2014-04-22 ENCOUNTER — Other Ambulatory Visit: Payer: Self-pay | Admitting: Family Medicine

## 2014-04-22 DIAGNOSIS — E781 Pure hyperglyceridemia: Secondary | ICD-10-CM | POA: Diagnosis not present

## 2014-04-22 DIAGNOSIS — K529 Noninfective gastroenteritis and colitis, unspecified: Secondary | ICD-10-CM

## 2014-04-22 DIAGNOSIS — E785 Hyperlipidemia, unspecified: Secondary | ICD-10-CM

## 2014-04-22 LAB — HEPATIC FUNCTION PANEL
ALBUMIN: 4.2 g/dL (ref 3.5–5.2)
ALK PHOS: 45 U/L (ref 39–117)
ALT: 11 U/L (ref 0–35)
AST: 15 U/L (ref 0–37)
BILIRUBIN TOTAL: 0.4 mg/dL (ref 0.2–1.2)
Bilirubin, Direct: 0.1 mg/dL (ref 0.0–0.3)
TOTAL PROTEIN: 7 g/dL (ref 6.0–8.3)

## 2014-05-15 ENCOUNTER — Encounter: Payer: Self-pay | Admitting: Gastroenterology

## 2014-05-29 ENCOUNTER — Ambulatory Visit (INDEPENDENT_AMBULATORY_CARE_PROVIDER_SITE_OTHER): Payer: Medicare Other | Admitting: Internal Medicine

## 2014-05-29 ENCOUNTER — Encounter: Payer: Self-pay | Admitting: Internal Medicine

## 2014-05-29 VITALS — BP 126/72 | HR 84 | Ht 64.75 in | Wt 239.0 lb

## 2014-05-29 DIAGNOSIS — K219 Gastro-esophageal reflux disease without esophagitis: Secondary | ICD-10-CM

## 2014-05-29 MED ORDER — ESOMEPRAZOLE MAGNESIUM 40 MG PO CPDR: 40.0000 mg | DELAYED_RELEASE_CAPSULE | Freq: Every day | ORAL | Status: DC

## 2014-05-29 MED ORDER — SUCRALFATE 1 GM/10ML PO SUSP: ORAL | Status: DC

## 2014-05-29 NOTE — Progress Notes (Signed)
April Combs [facility] Aug 08, 1945 [id number]  Note: This dictation was prepared with Dragon digital system. Any transcriptional errors that result from this procedure are unintentional.   History of Present Illness: This is a 69 year old white female post remote gastric bypass surgery for obesity who has had increasing symptoms of gastroesophageal reflux and regurgitation. For past 2 months she experiencies intense heartburn especially at night with the hot liquid regurgitating. She denies cough or dysphagia. She denies eating late at night. Heartburn also occurs during the day, her weight has been stable overweight. She has taken omeprazole 40 mg once or twice a day with breakthrough symptoms. She sleeps in a recliner. We have seen her in the past for colorectal screening. Colonoscopies in [place] showed hyperplastic polyps. She had infectious colitis in 2005. Last colonoscopy November 2013 showed 3 hyperplastic polyps and redundant colon    Past Medical History  Diagnosis Date  . Depression   . Hyperlipidemia   . Hypertension   . GERD (gastroesophageal reflux disease)   . NICM (nonischemic cardiomyopathy)     Essentially resolved. Echocardiogram July 2013 showed normal EF greater than 55%. Important septal motion. Normal LV filling pressures. Mild MR and mild anterior MVP  . Coronary artery disease (CAD) excluded November 2015    Low Risk Myoview (false positive) with possible inferior-inferolateral ischemia, but with nonsustained VT --> CATH --> Angiographically Normal Coronary Arteries  . Obesity (BMI 30-39.9)   . Osteoarthritis   . Positional vertigo   . Hyperplastic colon polyp     Past Surgical History  Procedure Laterality Date  . Abdominal hysterectomy    . Gastric bypass  1980  . Appendectomy  as child  . Kidney stone surgery    . Exercise tolerance test - cpet-met-test  July 2013    Submaximal effort. Only 80% of her, therefore peak VO2 of 75% is likely an underestimate.  High resting heart rate, achieving 86% of heart rate. --> Unable to interpret due to lack of effort.  . Pulmonary function tests  July 2013    Increased densities, decreased FVC - consistent with obesity hypoventilation; FEV1 is 58%, FVC 60% of predicted.  [name] Nm myoview ltd  November 2015    4:20 min, 4.6 METS --> DOE, but no chest discomfort; Significant + ST -T wave changes noted with NSVT & PVCs. Images suggest Inferior-inferolateral ischemia.  Low Risk. -- FALSE POSITIVE  . Colonoscopy    . Doppler echocardiography  July 2013    09/20/11. normal Lv thickness and function with EF greater thatn 55%  . Cardiac catheterization  November 2015    Angiographically normal coronaries  . Left heart catheterization with coronary angiogram N/A 02/01/2014    Procedure: LEFT HEART CATHETERIZATION WITH CORONARY ANGIOGRAM;  Surgeon: Leonie Man, MD;  Location: [facility] CATH LAB;  Service: Cardiovascular;  Laterality: N/A;    No Known Allergies  Family history and social history have been reviewed.  Review of Systems: Positive for heartburn. Regurgitation. Denies abdominal pain or weight loss  The remainder of the 10 point ROS is negative except as outlined in the H&P  Physical Exam: General Appearance Well developed, in no distress, overweight Eyes  Non icteric  HEENT  Non traumatic, normocephalic , normal voice. No hoarseness Mouth No lesion, tongue papillated, no cheilosis Neck Supple without adenopathy, thyroid not enlarged, no carotid bruits, no JVD Lungs Clear to auscultation bilaterally COR Normal S1, normal S2, regular rhythm, no murmur, quiet precordium Abdomen obese. Soft. Nontender. Extensive surgical  scars horizontally as well as postcholecystectomy. No ascites Rectal not done Extremities  No pedal edema Skin No lesions Neurological Alert and oriented x 3 Psychological Normal mood and affect  Assessment and Plan:   69 year old white female with exacerbation of  gastroesophageal  reflux ,refractory to PPIs. She has a history of gastric bypass for obesity. Gastroparesis may be contributing to her reflux. Anastomotic stricture is a possibility. We will switch to Nexium 40 mg at bedtime and continue Prilosec 40 mg in the morning. Since her insurance is not likely to cover Nexiem bid. She will continue strict antireflux measures and add Carafate slurry 10 cc twice a day. We will schedule her for upper endoscopy and biopsies.  History of colon polyps. She is up-to-date on colonoscopy. Last exam 2013. Next exam 2020   Delfin Edis 05/29/2014

## 2014-05-29 NOTE — Patient Instructions (Addendum)
Dr Ilda Foil have been scheduled for an endoscopy. Please follow written instructions given to you at your visit today. If you use inhalers (even only as needed), please bring them with you on the day of your procedure. Your physician has requested that you go to www.startemmi.com and enter the access code given to you at your visit today. This web site gives a general overview about your procedure. However, you should still follow specific instructions given to you by our office regarding your preparation for the procedure.   We have sent the following medications to your pharmacy for you to pick up at your convenience:  Nexium.   Continue to take your Omeprazole in the morning and take the Nexium at night

## 2014-06-05 ENCOUNTER — Other Ambulatory Visit: Payer: Self-pay | Admitting: *Deleted

## 2014-06-05 ENCOUNTER — Telehealth: Payer: Self-pay | Admitting: *Deleted

## 2014-06-05 ENCOUNTER — Ambulatory Visit (AMBULATORY_SURGERY_CENTER): Payer: Medicare Other | Admitting: Internal Medicine

## 2014-06-05 ENCOUNTER — Encounter: Payer: Self-pay | Admitting: Internal Medicine

## 2014-06-05 VITALS — BP 129/77 | HR 70 | Temp 97.6°F | Resp 38 | Ht 64.0 in | Wt 239.0 lb

## 2014-06-05 DIAGNOSIS — Z903 Acquired absence of stomach [part of]: Secondary | ICD-10-CM

## 2014-06-05 DIAGNOSIS — K219 Gastro-esophageal reflux disease without esophagitis: Secondary | ICD-10-CM | POA: Diagnosis present

## 2014-06-05 DIAGNOSIS — Z9889 Other specified postprocedural states: Secondary | ICD-10-CM | POA: Diagnosis not present

## 2014-06-05 DIAGNOSIS — K2950 Unspecified chronic gastritis without bleeding: Secondary | ICD-10-CM | POA: Diagnosis not present

## 2014-06-05 DIAGNOSIS — R112 Nausea with vomiting, unspecified: Secondary | ICD-10-CM

## 2014-06-05 DIAGNOSIS — I2510 Atherosclerotic heart disease of native coronary artery without angina pectoris: Secondary | ICD-10-CM | POA: Diagnosis not present

## 2014-06-05 MED ORDER — SODIUM CHLORIDE 0.9 % IV SOLN: 500.0000 mL | INTRAVENOUS | Status: DC

## 2014-06-05 MED ORDER — METOCLOPRAMIDE HCL 5 MG PO TABS: ORAL_TABLET | ORAL | Status: DC

## 2014-06-05 NOTE — Progress Notes (Signed)
Patient awakening,vss,report to rn 

## 2014-06-05 NOTE — Patient Instructions (Signed)
YOU HAD AN ENDOSCOPIC PROCEDURE TODAY AT THE [facility]:   Refer to the procedure report that was given to you for any specific questions about what was found during the examination.  If the procedure report does not answer your questions, please call your gastroenterologist to clarify.  If you requested that your care partner not be given the details of your procedure findings, then the procedure report has been included in a sealed envelope for you to review at your convenience later.  YOU SHOULD EXPECT: Some feelings of bloating in the abdomen. Passage of more gas than usual.  Walking can help get rid of the air that was put into your GI tract during the procedure and reduce the bloating. If you had a lower endoscopy (such as a colonoscopy or flexible sigmoidoscopy) you may notice spotting of blood in your stool or on the toilet paper. If you underwent a bowel prep for your procedure, you may not have a normal bowel movement for a few days.  Please Note:  You might notice some irritation and congestion in your nose or some drainage.  This is from the oxygen used during your procedure.  There is no need for concern and it should clear up in a day or so.  SYMPTOMS TO REPORT IMMEDIATELY:   Following lower endoscopy (colonoscopy or flexible sigmoidoscopy):  Excessive amounts of blood in the stool  Significant tenderness or worsening of abdominal pains  Swelling of the abdomen that is new, acute  Fever of 100F or higher   Following upper endoscopy (EGD)  Vomiting of blood or coffee ground material  New chest pain or pain under the shoulder blades  Painful or persistently difficult swallowing  New shortness of breath  Fever of 100F or higher  Black, tarry-looking stools  For urgent or emergent issues, a gastroenterologist can be reached at any hour by calling [phone].   DIET: Your first meal following the procedure should be a small meal and then it is ok to progress to  your normal diet. Heavy or fried foods are harder to digest and may make you feel nauseous or bloated.  Likewise, meals heavy in dairy and vegetables can increase bloating.  Drink plenty of fluids but you should avoid alcoholic beverages for 24 hours.  ACTIVITY:  You should plan to take it easy for the rest of today and you should NOT DRIVE or use heavy machinery until tomorrow (because of the sedation medicines used during the test).    FOLLOW UP: Our staff will call the number listed on your records the next business day following your procedure to check on you and address any questions or concerns that you may have regarding the information given to you following your procedure. If we do not reach you, we will leave a message.  However, if you are feeling well and you are not experiencing any problems, there is no need to return our call.  We will assume that you have returned to your regular daily activities without incident.  If any biopsies were taken you will be contacted by phone or by letter within the next 1-3 weeks.  Please call us at [phone] if you have not heard about the biopsies in 3 weeks.    SIGNATURES/CONFIDENTIALITY: You and/or your care partner have signed paperwork which will be entered into your electronic medical record.  These signatures attest to the fact that that the information above on your After Visit Summary has been reviewed   and is understood.  Full responsibility of the confidentiality of this discharge information lies with you and/or your care-partner.YOU HAD AN ENDOSCOPIC PROCEDURE TODAY AT [facility]:   Refer to the procedure report that was given to you for any specific questions about what was found during the examination.  If the procedure report does not answer your questions, please call your gastroenterologist to clarify.  If you requested that your care partner not be given the details of your procedure findings, then the procedure  report has been included in a sealed envelope for you to review at your convenience later.  YOU SHOULD EXPECT: Some feelings of bloating in the abdomen. Passage of more gas than usual.  Walking can help get rid of the air that was put into your GI tract during the procedure and reduce the bloating. If you had a lower endoscopy (such as a colonoscopy or flexible sigmoidoscopy) you may notice spotting of blood in your stool or on the toilet paper. If you underwent a bowel prep for your procedure, you may not have a normal bowel movement for a few days.  Please Note:  You might notice some irritation and congestion in your nose or some drainage.  This is from the oxygen used during your procedure.  There is no need for concern and it should clear up in a day or so.  SYMPTOMS TO REPORT IMMEDIATELY:     Following upper endoscopy (EGD)  Vomiting of blood or coffee ground material  New chest pain or pain under the shoulder blades  Painful or persistently difficult swallowing  New shortness of breath  Fever of 100F or higher  Black, tarry-looking stools  For urgent or emergent issues, a gastroenterologist can be reached at any hour by calling [phone].   DIET: Your first meal following the procedure should be a small meal and then it is ok to progress to your normal diet. Heavy or fried foods are harder to digest and may make you feel nauseous or bloated.  Likewise, meals heavy in dairy and vegetables can increase bloating.  Drink plenty of fluids but you should avoid alcoholic beverages for 24 hours.  ACTIVITY:  You should plan to take it easy for the rest of today and you should NOT DRIVE or use heavy machinery until tomorrow (because of the sedation medicines used during the test).    FOLLOW UP: Our staff will call the number listed on your records the next business day following your procedure to check on you and address any questions or concerns that you may have regarding the  information given to you following your procedure. If we do not reach you, we will leave a message.  However, if you are feeling well and you are not experiencing any problems, there is no need to return our call.  We will assume that you have returned to your regular daily activities without incident.  If any biopsies were taken you will be contacted by phone or by letter within the next 1-3 weeks.  Please call us at [phone] if you have not heard about the biopsies in 3 weeks.    SIGNATURES/CONFIDENTIALITY: You and/or your care partner have signed paperwork which will be entered into your electronic medical record.  These signatures attest to the fact that that the information above on your After Visit Summary has been reviewed and is understood.  Full responsibility of the confidentiality of this discharge information lies with you and/or your care-partner.  Anti reflux regimen given. Add Regan 5 mg. At bedtime. Gastric emptying scan to be scheduled. Continue PPI

## 2014-06-05 NOTE — Telephone Encounter (Signed)
Scheduled GES at [facility] radiology([place]) on 06/18/14 at 7:30 AM. No stomach medications 8 hours prior.

## 2014-06-05 NOTE — Progress Notes (Signed)
Called to room to assist during endoscopic procedure.  Patient ID and intended procedure confirmed with present staff. Received instructions for my participation in the procedure from the performing physician.  

## 2014-06-05 NOTE — Telephone Encounter (Signed)
Patient given appointment date, time and instructions. 

## 2014-06-05 NOTE — Op Note (Signed)
[facility]  [address] [place], [id number]   ENDOSCOPY PROCEDURE REPORT  PATIENT: [name], [name]  MR#: [id number] BIRTHDATE: 1945-09-24 , 67  yrs. old GENDER: female ENDOSCOPIST: Lafayette Dragon, MD REFERRED BY:  Annye Asa, M.D. PROCEDURE DATE:  06/05/2014 PROCEDURE:  EGD w/ biopsy ASA CLASS:     Class II INDICATIONS:  Increased gastroesophageal reflux refractory to PPIs. Status post remote gastric bypass for obesity.[name] MEDICATIONS: Monitored anesthesia care and Propofol 250 mg IV TOPICAL ANESTHETIC: none  DESCRIPTION OF PROCEDURE: After the risks benefits and alternatives of the procedure were thoroughly explained, informed consent was obtained.  The LB [id number] endoscope was introduced through the mouth and advanced to the second portion of the duodenum , Without limitations.  The instrument was slowly withdrawn as the mucosa was fully examined.    Esophagus: esophagus was intubated without difficulty. Mucosa in the proximal, mid and distal esophagus appeared normal including squamocolumnar juncture. There was no evidence of acute esophagitis or stricture.  Stomach: gastric remnant was consistent with prior partial gastrectomy and extended from 35-40 cm from the incisors. There was no retained food or acid. 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 The anastomosis was mildly erythematous but there was no stricture or erosions. Retroflexion of the endoscope revealed normal fundus and cardia. Biopsies were obtained from the anastomosis  Jejunum: Roux-en-Y jejunal anastomosis was followed over a length of at least 15 cm. There was no evidence of obstruction or fluid retention. There was no bile seen. 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       The scope was then withdrawn from the patient and the procedure completed.  COMPLICATIONS: There were no immediate  complications.  ENDOSCOPIC IMPRESSION: 1. 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 No evidence of obstruction or retention 2. Mild nonspecific erythema at the anastomosis. Status post biopsies  RECOMMENDATIONS: 1.  Await biopsy results 2.  Anti-reflux regimen to be follow 3.  Continue PPI 4.  Add Reglan 5 mg at bedtime Obtain gastric emptying scan to assess for gastroparesis  REPEAT EXAM: for EGD pending biopsy results.  eSigned:  Lafayette Dragon, MD 06/05/2014 7:58 AM    CC:  PATIENT NAME:  April, Combs MR#: [id number]

## 2014-06-06 ENCOUNTER — Telehealth: Payer: Self-pay

## 2014-06-06 NOTE — Telephone Encounter (Signed)
Left message on answering machine. 

## 2014-06-11 ENCOUNTER — Encounter: Payer: Self-pay | Admitting: Internal Medicine

## 2014-06-18 ENCOUNTER — Ambulatory Visit (HOSPITAL_COMMUNITY)
Admission: RE | Admit: 2014-06-18 | Discharge: 2014-06-18 | Disposition: A | Discharge status: Home / Self Care | Payer: Medicare Other | Source: Ambulatory Visit | Attending: Internal Medicine | Admitting: Internal Medicine

## 2014-06-18 DIAGNOSIS — R112 Nausea with vomiting, unspecified: Secondary | ICD-10-CM | POA: Diagnosis not present

## 2014-06-18 DIAGNOSIS — K219 Gastro-esophageal reflux disease without esophagitis: Secondary | ICD-10-CM | POA: Diagnosis not present

## 2014-06-18 DIAGNOSIS — R142 Eructation: Secondary | ICD-10-CM | POA: Diagnosis not present

## 2014-06-18 MED ORDER — TECHNETIUM TC 99M SULFUR COLLOID: 2.2000 | Freq: Once | INTRAVENOUS | Status: AC | PRN

## 2014-06-25 ENCOUNTER — Other Ambulatory Visit: Payer: Self-pay | Admitting: Family Medicine

## 2014-06-26 ENCOUNTER — Other Ambulatory Visit: Payer: Self-pay | Admitting: General Practice

## 2014-06-26 MED ORDER — FENOFIBRATE 160 MG PO TABS: 160.0000 mg | ORAL_TABLET | Freq: Every day | ORAL | Status: DC

## 2014-09-24 ENCOUNTER — Encounter: Payer: Self-pay | Admitting: *Deleted

## 2014-09-24 ENCOUNTER — Telehealth: Payer: Self-pay | Admitting: *Deleted

## 2014-09-24 NOTE — Telephone Encounter (Signed)
Pre-Visit Call completed with patient and chart updated.   Pre-Visit Info documented in Specialty Comments under [employer].    

## 2014-09-25 ENCOUNTER — Ambulatory Visit (INDEPENDENT_AMBULATORY_CARE_PROVIDER_SITE_OTHER): Payer: Medicare Other | Admitting: Family Medicine

## 2014-09-25 ENCOUNTER — Ambulatory Visit (HOSPITAL_BASED_OUTPATIENT_CLINIC_OR_DEPARTMENT_OTHER)
Admission: RE | Admit: 2014-09-25 | Discharge: 2014-09-25 | Disposition: A | Discharge status: Home / Self Care | Payer: Medicare Other | Source: Ambulatory Visit | Attending: Family Medicine | Admitting: Family Medicine

## 2014-09-25 ENCOUNTER — Encounter: Payer: Self-pay | Admitting: Family Medicine

## 2014-09-25 VITALS — BP 126/76 | HR 71 | Temp 97.9°F | Resp 16 | Ht 64.0 in | Wt 238.0 lb

## 2014-09-25 DIAGNOSIS — Z87891 Personal history of nicotine dependence: Secondary | ICD-10-CM

## 2014-09-25 DIAGNOSIS — J439 Emphysema, unspecified: Secondary | ICD-10-CM | POA: Diagnosis not present

## 2014-09-25 DIAGNOSIS — F17200 Nicotine dependence, unspecified, uncomplicated: Secondary | ICD-10-CM | POA: Diagnosis not present

## 2014-09-25 DIAGNOSIS — E781 Pure hyperglyceridemia: Secondary | ICD-10-CM

## 2014-09-25 DIAGNOSIS — I700 Atherosclerosis of aorta: Secondary | ICD-10-CM | POA: Diagnosis not present

## 2014-09-25 DIAGNOSIS — Z0000 Encounter for general adult medical examination without abnormal findings: Secondary | ICD-10-CM | POA: Diagnosis not present

## 2014-09-25 DIAGNOSIS — E785 Hyperlipidemia, unspecified: Secondary | ICD-10-CM | POA: Diagnosis not present

## 2014-09-25 DIAGNOSIS — I10 Essential (primary) hypertension: Secondary | ICD-10-CM | POA: Diagnosis not present

## 2014-09-25 DIAGNOSIS — Z23 Encounter for immunization: Secondary | ICD-10-CM

## 2014-09-25 DIAGNOSIS — R0602 Shortness of breath: Secondary | ICD-10-CM | POA: Diagnosis not present

## 2014-09-25 DIAGNOSIS — Z122 Encounter for screening for malignant neoplasm of respiratory organs: Secondary | ICD-10-CM | POA: Diagnosis not present

## 2014-09-25 LAB — HEPATIC FUNCTION PANEL
ALT: 14 U/L (ref 0–35)
AST: 19 U/L (ref 0–37)
Albumin: 4.2 g/dL (ref 3.5–5.2)
Alkaline Phosphatase: 40 U/L (ref 39–117)
Bilirubin, Direct: 0.1 mg/dL (ref 0.0–0.3)
Total Bilirubin: 0.5 mg/dL (ref 0.2–1.2)
Total Protein: 7 g/dL (ref 6.0–8.3)

## 2014-09-25 LAB — CBC WITH DIFFERENTIAL/PLATELET
Basophils Absolute: 0 10*3/uL (ref 0.0–0.1)
Basophils Relative: 0.5 % (ref 0.0–3.0)
Eosinophils Absolute: 0.4 10*3/uL (ref 0.0–0.7)
Eosinophils Relative: 8.2 % — ABNORMAL HIGH (ref 0.0–5.0)
HCT: 39.1 % (ref 36.0–46.0)
Hemoglobin: 13.3 g/dL (ref 12.0–15.0)
LYMPHS ABS: 1.6 10*3/uL (ref 0.7–4.0)
Lymphocytes Relative: 31.1 % (ref 12.0–46.0)
MCHC: 34 g/dL (ref 30.0–36.0)
MCV: 91.3 fl (ref 78.0–100.0)
MONO ABS: 0.4 10*3/uL (ref 0.1–1.0)
Monocytes Relative: 6.8 % (ref 3.0–12.0)
NEUTROS PCT: 53.4 % (ref 43.0–77.0)
Neutro Abs: 2.8 10*3/uL (ref 1.4–7.7)
Platelets: 176 10*3/uL (ref 150.0–400.0)
RBC: 4.29 Mil/uL (ref 3.87–5.11)
RDW: 13.4 % (ref 11.5–15.5)
WBC: 5.2 10*3/uL (ref 4.0–10.5)

## 2014-09-25 LAB — BASIC METABOLIC PANEL
BUN: 16 mg/dL (ref 6–23)
CHLORIDE: 104 meq/L (ref 96–112)
CO2: 27 mEq/L (ref 19–32)
CREATININE: 0.85 mg/dL (ref 0.40–1.20)
Calcium: 9.5 mg/dL (ref 8.4–10.5)
GFR: 70.67 mL/min (ref >60.00)
GLUCOSE: 91 mg/dL (ref 70–99)
POTASSIUM: 4.1 meq/L (ref 3.5–5.1)
SODIUM: 139 meq/L (ref 135–145)

## 2014-09-25 LAB — LIPID PANEL
CHOLESTEROL: 163 mg/dL (ref 0–200)
HDL: 57.6 mg/dL (ref >39.00)
LDL Cholesterol: 75 mg/dL (ref 0–99)
NonHDL: 105.4
Total CHOL/HDL Ratio: 3
Triglycerides: 154 mg/dL — ABNORMAL HIGH (ref 0.0–149.0)
VLDL: 30.8 mg/dL (ref 0.0–40.0)

## 2014-09-25 LAB — TSH: TSH: 3.35 u[IU]/mL (ref 0.35–4.50)

## 2014-09-25 MED ORDER — CARVEDILOL 12.5 MG PO TABS: 12.5000 mg | ORAL_TABLET | Freq: Two times a day (BID) | ORAL | Status: DC

## 2014-09-25 MED ORDER — NITROGLYCERIN 0.4 MG SL SUBL: 0.4000 mg | SUBLINGUAL_TABLET | SUBLINGUAL | Status: DC | PRN

## 2014-09-25 MED ORDER — RANITIDINE HCL 300 MG PO TABS: 300.0000 mg | ORAL_TABLET | Freq: Every day | ORAL | Status: DC

## 2014-09-25 MED ORDER — FENOFIBRATE 160 MG PO TABS: 160.0000 mg | ORAL_TABLET | Freq: Every day | ORAL | Status: DC

## 2014-09-25 MED ORDER — ATORVASTATIN CALCIUM 20 MG PO TABS: 20.0000 mg | ORAL_TABLET | Freq: Every day | ORAL | Status: DC

## 2014-09-25 MED ORDER — LISINOPRIL-HYDROCHLOROTHIAZIDE 20-25 MG PO TABS: 1.0000 | ORAL_TABLET | Freq: Every day | ORAL | Status: DC

## 2014-09-25 MED ORDER — OMEPRAZOLE 40 MG PO CPDR: 40.0000 mg | DELAYED_RELEASE_CAPSULE | Freq: Every day | ORAL | Status: DC

## 2014-09-25 NOTE — Patient Instructions (Signed)
Follow up in 6 months to recheck BP and cholesterol We'll notify you of your lab results and make any changes if needed We'll call you with your CT scan Start the Ranitidine nightly in addition to the Omeprazole Please call and schedule your mammogram You are good on colonoscopy until 2023! Call with any questions or concerns Have a great summer!!!

## 2014-09-25 NOTE — Progress Notes (Signed)
   Subjective:    Patient ID: April Combs, female    DOB: 06-Dec-1945, 69 y.o.   MRN: [id number]  HPI Here today for CPE.  Risk Factors: HTN- well controlled on Coreg, Lisinopril-HCTZ.  Denies CP, SOB, HAs, visual changes Hyperlipidemia- chronic problem, on Lipitor and fenofibrate GERD- chronic problem, pt is currently on Omeprazole, still having some nocturnal sxs. Physical Activity: limited due to OA and obesity Fall Risk: low risk Depression: denies current sxs Hearing: normal to conversational tones and whispered voice at 6 ft ADL's: independent Cognitive: normal linear thought process, memory and attention intact Home Safety: safe at home Height, Weight, BMI, Visual Acuity: see vitals, vision corrected to 20/20 w/ glasses Counseling: UTD on colonoscopy (due 2023), mammo due at end of month, due for DEXA- pt declines.  No need for paps. Labs Ordered: See A&P Care Plan: See A&P    Review of Systems Patient reports no vision/ hearing changes, adenopathy,fever, weight change,  persistant/recurrent hoarseness , swallowing issues, chest pain, palpitations, edema, persistant/recurrent cough, hemoptysis, gastrointestinal bleeding (melena, rectal bleeding), abdominal pain, bowel changes, GU symptoms (dysuria, hematuria, incontinence), Gyn symptoms (abnormal  bleeding, pain),  syncope, focal weakness, memory loss, numbness & tingling, skin/hair/nail changes, abnormal bruising or bleeding, anxiety, or depression.   + SOB- chronic problem for pt, denies sxs above normal baseline.  Pt interested in CXR.  Pt has hx of tobacco use.  Pt smoked for ~17 yrs but smoked 1.5-2ppd (meaning >30 pack year)    Objective:   Physical Exam General Appearance:    Alert, cooperative, no distress, appears stated age  Head:    Normocephalic, without obvious abnormality, atraumatic  Eyes:    PERRL, conjunctiva/corneas clear, EOM's intact, fundi    benign, both eyes  Ears:    Normal TM's and external ear  canals, both ears  Nose:   Nares normal, septum midline, mucosa normal, no drainage    or sinus tenderness  Throat:   Lips, mucosa, and tongue normal; teeth and gums normal  Neck:   Supple, symmetrical, trachea midline, no adenopathy;    Thyroid: no enlargement/tenderness/nodules  Back:     Symmetric, no curvature, ROM normal, no CVA tenderness  Lungs:     Clear to auscultation bilaterally, respirations unlabored  Chest Wall:    No tenderness or deformity   Heart:    Regular rate and rhythm, S1 and S2 normal, no murmur, rub   or gallop  Breast Exam:    Deferred to GYN  Abdomen:     Soft, non-tender, bowel sounds active all four quadrants,    no masses, no organomegaly  Genitalia:    Deferred to GYN  Rectal:    Extremities:   Extremities normal, atraumatic, no cyanosis or edema  Pulses:   2+ and symmetric all extremities  Skin:   Skin color, texture, turgor normal, no rashes or lesions  Lymph nodes:   Cervical, supraclavicular, and axillary nodes normal  Neurologic:   CNII-XII intact, normal strength, sensation and reflexes    throughout          Assessment & Plan:

## 2014-09-25 NOTE — Progress Notes (Signed)
Pre visit review using our clinic review tool, if applicable. No additional management support is needed unless otherwise documented below in the visit note. 

## 2014-09-29 DIAGNOSIS — Z87891 Personal history of nicotine dependence: Secondary | ICD-10-CM | POA: Insufficient documentation

## 2014-09-29 HISTORY — DX: Personal history of nicotine dependence: Z87.891

## 2014-09-29 NOTE — Assessment & Plan Note (Signed)
Pt's PE unchanged from previous and WNL w/ exception of obesity.  Due for mammo at end of month- plans to schedule.  Pt refusing DEXA.  UTD on colonoscopy.  Written screening schedule updated and given to pt.  Check labs.  Anticipatory guidance provided.

## 2014-09-29 NOTE — Assessment & Plan Note (Signed)
Chronic problem.  Well controlled.  Asymptomatic w/ exception of baseline SOB.  Check labs.  No anticipated med changes.

## 2014-09-29 NOTE — Assessment & Plan Note (Signed)
Pt has extensive smoking hx.  She meets criteria for low dose screening CT scan.  Order entered.

## 2014-09-29 NOTE — Assessment & Plan Note (Signed)
Chronic problem.  Tolerating lipitor and fenofibrate w/o difficulty.  Check labs.  Adjust meds prn

## 2014-10-02 ENCOUNTER — Other Ambulatory Visit: Payer: Self-pay | Admitting: Orthopedic Surgery

## 2014-10-02 DIAGNOSIS — M545 Low back pain, unspecified: Secondary | ICD-10-CM

## 2014-10-16 ENCOUNTER — Ambulatory Visit
Admission: RE | Admit: 2014-10-16 | Discharge: 2014-10-16 | Disposition: A | Discharge status: Home / Self Care | Payer: Medicare Other | Source: Ambulatory Visit | Attending: Orthopedic Surgery | Admitting: Orthopedic Surgery

## 2014-10-16 DIAGNOSIS — M5136 Other intervertebral disc degeneration, lumbar region: Secondary | ICD-10-CM | POA: Diagnosis not present

## 2014-10-16 DIAGNOSIS — M545 Low back pain, unspecified: Secondary | ICD-10-CM

## 2014-10-16 DIAGNOSIS — M5127 Other intervertebral disc displacement, lumbosacral region: Secondary | ICD-10-CM | POA: Diagnosis not present

## 2014-10-16 DIAGNOSIS — M47817 Spondylosis without myelopathy or radiculopathy, lumbosacral region: Secondary | ICD-10-CM | POA: Diagnosis not present

## 2014-12-12 ENCOUNTER — Encounter: Payer: Self-pay | Admitting: Internal Medicine

## 2014-12-24 ENCOUNTER — Encounter: Payer: Self-pay | Admitting: Family Medicine

## 2014-12-24 ENCOUNTER — Ambulatory Visit (INDEPENDENT_AMBULATORY_CARE_PROVIDER_SITE_OTHER): Payer: Medicare Other | Admitting: Family Medicine

## 2014-12-24 VITALS — BP 122/64 | HR 94 | Temp 97.9°F | Ht 65.0 in | Wt 238.5 lb

## 2014-12-24 DIAGNOSIS — M5416 Radiculopathy, lumbar region: Secondary | ICD-10-CM

## 2014-12-24 DIAGNOSIS — J439 Emphysema, unspecified: Secondary | ICD-10-CM | POA: Diagnosis not present

## 2014-12-24 HISTORY — DX: Radiculopathy, lumbar region: M54.16

## 2014-12-24 MED ORDER — TRAMADOL HCL 50 MG PO TABS: 50.0000 mg | ORAL_TABLET | Freq: Three times a day (TID) | ORAL | Status: DC | PRN

## 2014-12-24 NOTE — Patient Instructions (Signed)
Follow up as needed We'll call you with your pulmonary appt Start the Tramadol as needed for severe back pain- take prior to cooking dinner (take w/ small snack/food) Call with any questions or concerns Hang in there!!!

## 2014-12-24 NOTE — Assessment & Plan Note (Signed)
New.  Noted on lung cancer screening CT.  Pt is asking for referral to Dr Annamaria Boots- order placed.

## 2014-12-24 NOTE — Progress Notes (Signed)
   Subjective:    Patient ID: April Combs, female    DOB: 1945-04-26, 69 y.o.   MRN: [id number]  HPI Back pain- ongoing problem for pt.  Has been seeing Dr Lynann Bologna.  Had MRI done showing annular fissure and bulging at L4-5 and they recommended back injxns- pt declined.  Pt reports pain is worsening- unable to stand for any period of time.  Pain is localized to low back and upper hips.  Some radiation of pain into L buttock.  No weakness of legs.     Review of Systems For ROS see HPI     Objective:   Physical Exam  Constitutional: She is oriented to person, place, and time. She appears well-developed and well-nourished. No distress.  HENT:  Head: Normocephalic and atraumatic.  Cardiovascular: Intact distal pulses.   Musculoskeletal: She exhibits no edema.  Neurological: She is alert and oriented to person, place, and time. Coordination (antalgic gait) normal.  (-) SLR bilaterally  Skin: Skin is warm and dry.  Psychiatric: She has a normal mood and affect. Her behavior is normal. Thought content normal.  Vitals reviewed.         Assessment & Plan:

## 2014-12-24 NOTE — Assessment & Plan Note (Signed)
New to provider, ongoing for pt.  Pt has seen Dr Lynann Bologna and had MRI done showing L4/5 bulge.  Pt is not at all interested in recommended epidural injxns after a previous bad experience.  Counseled her that this was really the best option- she adamantly refuses.  Based on this, will start tramadol as needed.  Pt expressed understanding and is in agreement w/ plan.

## 2014-12-24 NOTE — Progress Notes (Signed)
Pre visit review using our clinic review tool, if applicable. No additional management support is needed unless otherwise documented below in the visit note. 

## 2015-01-10 ENCOUNTER — Encounter: Payer: Self-pay | Admitting: Family Medicine

## 2015-01-10 DIAGNOSIS — J439 Emphysema, unspecified: Secondary | ICD-10-CM

## 2015-01-10 NOTE — Telephone Encounter (Signed)
Referral placed.

## 2015-01-28 ENCOUNTER — Ambulatory Visit (INDEPENDENT_AMBULATORY_CARE_PROVIDER_SITE_OTHER): Payer: Medicare Other | Admitting: Behavioral Health

## 2015-01-28 DIAGNOSIS — Z23 Encounter for immunization: Secondary | ICD-10-CM

## 2015-01-28 NOTE — Progress Notes (Signed)
Pre visit review using our clinic review tool, if applicable. No additional management support is needed unless otherwise documented below in the visit note. 

## 2015-01-31 ENCOUNTER — Other Ambulatory Visit: Payer: Self-pay | Admitting: Family Medicine

## 2015-01-31 NOTE — Telephone Encounter (Signed)
Medication filled to pharmacy as requested.   

## 2015-01-31 NOTE — Telephone Encounter (Signed)
Last OV 12/24/14 Tramadol last filled 12/24/14 #45 with 0

## 2015-02-03 ENCOUNTER — Encounter: Payer: Self-pay | Admitting: Family Medicine

## 2015-02-04 NOTE — Telephone Encounter (Signed)
Patient returned call, States yesterday after eating gravy biscuit she started cramping in lower part of stomach. States she went to bathroom and passed blood mixed with mucus. Says no bm noted. States this happened about 4 times yesterday and once this am, however, a smaller amount. States she has been under a lot of stress recently from taking care of sister who has been diagnosed with Cancer. States she has been helping with lifting sister. Patient states she has no pain, sob or any other symptoms. No cramping this am this far.

## 2015-02-04 NOTE — Telephone Encounter (Signed)
Called patient left message for return call.

## 2015-02-05 ENCOUNTER — Encounter: Payer: Self-pay | Admitting: Family Medicine

## 2015-02-05 ENCOUNTER — Ambulatory Visit (INDEPENDENT_AMBULATORY_CARE_PROVIDER_SITE_OTHER): Payer: Medicare Other | Admitting: Family Medicine

## 2015-02-05 ENCOUNTER — Ambulatory Visit: Payer: Medicare Other | Admitting: Family Medicine

## 2015-02-05 VITALS — BP 122/74 | HR 80 | Temp 98.1°F | Resp 16 | Ht 65.0 in | Wt 238.0 lb

## 2015-02-05 DIAGNOSIS — K625 Hemorrhage of anus and rectum: Secondary | ICD-10-CM

## 2015-02-05 HISTORY — DX: Hemorrhage of anus and rectum: K62.5

## 2015-02-05 NOTE — Progress Notes (Signed)
Pre visit review using our clinic review tool, if applicable. No additional management support is needed unless otherwise documented below in the visit note. 

## 2015-02-05 NOTE — Patient Instructions (Signed)
Follow up as scheduled If you have repeat bleeding- let me know so we can send you to GI Drink plenty of fluids to avoid constipation Call with any questions or concerns Hang in there!!! Happy Thanksgiving!!!

## 2015-02-05 NOTE — Assessment & Plan Note (Signed)
New.  Pt has been doing a lot of heavy lifting lately as she has been caring for her sister w/ stage IV cancer.  No abd pain, no N/V.  + BRBPR x4 on Monday, 2 yesterday but each episode lessened in amount.  Completely resolved since 11am yesterday.  UTD on colonoscopy- done 3 yrs ago w/ 3 polyps present.  Heme + stool in office.  Suspect diverticular bleed.  No additional work up at this time but if pt has another bleed will need to see GI.  Pt expressed understanding and is in agreement w/ plan.

## 2015-02-05 NOTE — Progress Notes (Signed)
   Subjective:    Patient ID: April Combs, female    DOB: [birth year], 69 y.o.   MRN: [id number]  HPI BRBPR- pt had quite a bit of blood on Monday.  4 episodes Monday, 2 yesterday but each episode lessened.  No blood in underwear between stools.  No pain w/ exception of lower abd cramping.  No fevers.  Pt got 'real sick' Monday after eating gravy which is not unusual when she eats anything w/ dairy.  Had colonoscopy 3 yrs ago w/ 3 polyps present.  No mention of diverticula.  No pain w/ sitting, no recent hemorrhoids.  No vaginal bleeding.  Otherwise feeling well.  No recent constipation or straining.   Review of Systems For ROS see HPI     Objective:   Physical Exam  Constitutional: She is oriented to person, place, and time. She appears well-developed and well-nourished. No distress.  obese  HENT:  Head: Normocephalic and atraumatic.  Genitourinary: Guaiac positive stool.  + rectal skin tags No internal or external hemorrhoids noted No gross blood No pain on DRE  Neurological: She is alert and oriented to person, place, and time.  Skin: Skin is warm and dry. No pallor.  Psychiatric: She has a normal mood and affect. Her behavior is normal. Thought content normal.  Vitals reviewed.         Assessment & Plan:

## 2015-02-10 NOTE — Telephone Encounter (Signed)
Follow up call made to patient states she has been doing fine and will be referred to Dr. Olevia Perches if she has another bleeding occurrence.

## 2015-02-18 ENCOUNTER — Ambulatory Visit (INDEPENDENT_AMBULATORY_CARE_PROVIDER_SITE_OTHER): Payer: Medicare Other | Admitting: Cardiology

## 2015-02-18 VITALS — BP 182/100 | HR 87 | Ht 64.0 in | Wt 243.0 lb

## 2015-02-18 DIAGNOSIS — E785 Hyperlipidemia, unspecified: Secondary | ICD-10-CM | POA: Diagnosis not present

## 2015-02-18 DIAGNOSIS — I10 Essential (primary) hypertension: Secondary | ICD-10-CM | POA: Diagnosis not present

## 2015-02-18 DIAGNOSIS — R0602 Shortness of breath: Secondary | ICD-10-CM | POA: Diagnosis not present

## 2015-02-18 DIAGNOSIS — R079 Chest pain, unspecified: Secondary | ICD-10-CM | POA: Diagnosis not present

## 2015-02-18 DIAGNOSIS — I499 Cardiac arrhythmia, unspecified: Secondary | ICD-10-CM | POA: Diagnosis not present

## 2015-02-18 NOTE — Patient Instructions (Signed)
NO CHANGES IN CURRENT TREATMENT   Your physician wants you to follow-up in [place].  You will receive a reminder letter in the mail two months in advance. If you don't receive a letter, please call our office to schedule the follow-up appointment.  If you need a refill on your cardiac medications before your next appointment, please call your pharmacy.

## 2015-02-18 NOTE — Progress Notes (Signed)
PCP: April Asa, MD  Clinic Note: Chief Complaint  Patient presents with  . Annual Exam  . Shortness of Breath    pt states she get SOB, no light headedness or dizziness  . Edema    no edema  . Chest Pain    no chest pain    HPI: April Combs is a 69 y.o. female with a PMH below who presents today for annual f/u.[name]  April Combs was last seen in Dec 2015 - at that time, we had completed CV evaluation of dyspnea.  Cath with no obstructive CAD (in 01/2104),  Recent Hospitalizations: none  Studies Reviewed: None  Interval History: Essentially at her baseline.  Is happy to have a Dx to explain dyspnea - Emphysema noted on chest CT. + DOE - walking - recent Dx of Emphysema. Even SOB rolling over.  Referred to Pulmonology. Recent cold - sleeping on 6 pillows for sinus congestion - but usually only 1-2 pillow. No chest pain or with rest or exertion.  No PND, orthopnea or edema.  Occasional palpitations -- not sustained. - described as "irregular heart beats" Episode of lightheadedness & dizziness - got nauseated after eating biscuits & gravy weakness - felt near syncopal, but no syncope. No syncope/near syncope otherwise - except with nausea episodes (no throwing up) No TIA/amaurosis fugax symptoms. No claudication.  ROS: A comprehensive was performed.  As noted in HPI.  Review of Systems  Constitutional: Negative for malaise/fatigue.  HENT: Positive for congestion.   Respiratory: Positive for cough, shortness of breath and wheezing.   Gastrointestinal: Positive for nausea. Negative for blood in stool.  Genitourinary: Positive for flank pain. Negative for hematuria.  Neurological: Positive for dizziness.  Psychiatric/Behavioral: Negative.   All other systems reviewed and are negative.   Past Medical History  Diagnosis Date  . Depression   . Hyperlipidemia   . Hypertension   . GERD (gastroesophageal reflux disease)   . NICM (nonischemic cardiomyopathy)  ([place])     Essentially resolved. Echocardiogram July 2013 showed normal EF greater than 55%. Important septal motion. Normal LV filling pressures. Mild MR and mild anterior MVP  . Coronary artery disease (CAD) excluded November 2015    Low Risk Myoview (false positive) with possible inferior-inferolateral ischemia, but with nonsustained VT --> CATH --> Angiographically Normal Coronary Arteries  . Obesity (BMI 30-39.9)   . Osteoarthritis   . Positional vertigo   . Hyperplastic colon polyp   . Anemia   . Anxiety     Past Surgical History  Procedure Laterality Date  . Abdominal hysterectomy    . Gastric bypass  1980  . Appendectomy  as child  . Kidney stone surgery    . Exercise tolerance test - cpet-met-test  July 2013    Submaximal effort. Only 80% of her, therefore peak VO2 of 75% is likely an underestimate. High resting heart rate, achieving 86% of heart rate. --> Unable to interpret due to lack of effort.  . Pulmonary function tests  July 2013    Increased densities, decreased FVC - consistent with obesity hypoventilation; FEV1 is 58%, FVC 60% of predicted.  [name] Nm myoview ltd  November 2015    4:20 min, 4.6 METS --> DOE, but no chest discomfort; Significant + ST -T wave changes noted with NSVT & PVCs. Images suggest Inferior-inferolateral ischemia.  Low Risk. -- FALSE POSITIVE  . Colonoscopy    . Doppler echocardiography  July 2013    09/20/11. normal Lv thickness and  function with EF greater thatn 55%  . Cardiac catheterization  November 2015    Angiographically normal coronaries  . Left heart catheterization with coronary angiogram N/A 02/01/2014    Procedure: LEFT HEART CATHETERIZATION WITH CORONARY ANGIOGRAM;  Surgeon: Leonie Man, MD;  Location[facility] CATH LAB;  Service: Cardiovascular;  Laterality: N/A;  . Gastric bypass    . Cholecystectomy    . Upper gastrointestinal endoscopy     Prior to Admission medications   Medication Sig Start Date End Date Taking? Authorizing  Provider  atorvastatin (LIPITOR) 20 MG tablet Take 1 tablet (20 mg total) by mouth daily. 09/25/14 10/19/15 Yes Midge Minium, MD  carvedilol (COREG) 12.5 MG tablet Take 1 tablet (12.5 mg total) by mouth 2 (two) times daily with a meal. 09/25/14  Yes Midge Minium, MD  fenofibrate 160 MG tablet Take 1 tablet (160 mg total) by mouth daily. 09/25/14  Yes Midge Minium, MD  lisinopril-hydrochlorothiazide (PRINZIDE,ZESTORETIC) 20-25 MG per tablet Take 1 tablet by mouth daily. 09/25/14  Yes Midge Minium, MD  meclizine (ANTIVERT) 50 MG tablet Take 1 tablet (50 mg total) by mouth 3 (three) times daily as needed for dizziness. 09/19/13  Yes Midge Minium, MD  metoCLOPramide (REGLAN) 5 MG tablet Take one po nightly 06/05/14  Yes Lafayette Dragon, MD  nitroGLYCERIN (NITROSTAT) 0.4 MG SL tablet Place 1 tablet (0.4 mg total) under the tongue every 5 (five) minutes as needed for chest pain. 09/25/14  Yes Midge Minium, MD  omeprazole (PRILOSEC) 40 MG capsule Take 1 capsule (40 mg total) by mouth daily. 09/25/14  Yes Midge Minium, MD  ranitidine (ZANTAC) 300 MG tablet Take 1 tablet (300 mg total) by mouth at bedtime. 09/25/14  Yes Midge Minium, MD  traMADol (ULTRAM) 50 MG tablet TAKE ONE TABLET BY MOUTH EVERY 8 HOURS AS NEEDED 01/31/15  Yes Midge Minium, MD    No Known Allergies   Social History   Social History  . Marital Status: Married    Spouse Name: N/A  . Number of Children: 3  . Years of Education: N/A   Occupational History  . Retired    Social History Main Topics  . Smoking status: Former Smoker    Quit date: 01/18/2003  . Smokeless tobacco: Never Used  . Alcohol Use: 1.8 oz/week    3 Cans of beer per week     Comment: drinks 2-3 beers a week  . Drug Use: No  . Sexual Activity: Not Asked   Other Topics Concern  . None   Social History Narrative   She is married to April Combs, also patient of Dr. Ellyn Hack.   Mother of 3, grandmother 23.   Quit  smoking in 2005. Social alcohol.   No routine exercise.   Family History  Problem Relation Age of Onset  . Colon cancer Maternal Aunt   . Lung cancer Sister     smoker  . Colon cancer Maternal Uncle   . Colon polyps Neg Hx   . Esophageal cancer Neg Hx   . Gallbladder disease Neg Hx   . Rectal cancer Neg Hx   . Stomach cancer Neg Hx   . Kidney disease Daughter   . Multiple sclerosis Daughter   . Diabetes Daughter     x3  . Heart disease Father   . Heart disease Mother   . Heart disease Paternal Grandfather   . Heart disease Maternal Aunt   . Colon  cancer Other     early 45's.  Genetic testing from maternal side of family.    Wt Readings from Last 3 Encounters:  02/18/15 243 lb (110.224 kg)  02/05/15 238 lb (107.956 kg)  12/24/14 238 lb 8 oz (108.183 kg)    PHYSICAL EXAM BP 182/100 mmHg  Pulse 87  Ht _0  (1.626 m)  Wt 243 lb (110.224 kg)  BMI 41.69 kg/m2 General appearance: alert, cooperative, appears stated age, no distress and morbidly obese - truncal obesity Neck: no adenopathy, no carotid bruit, no JVD and supple, symmetrical, trachea midline Lungs: CTAB, normal percussion bilaterally and Nonlabored, good movement Heart: normal apical impulse, RRR , S1&S2 normal, no S3 or S4 and 1/6 HSM at apex. No click. No R./G. Abdomen: soft, non-tender; bowel sounds normal; no masses, no organomegaly and Morbidly obese; unable to palpate organomegaly Extremities: edema Trace edema. No clubbing or cyanosis. Mild venous stasis changes. Pulses: 2+ and symmetric Neurologic: Grossly normal    Adult ECG Report  Rate: 87 ;  Rhythm: normal sinus rhythm and premature ventricular contractions (PVC); stable Inf TWI. Normal Axis & intervals  Narrative Interpretation: Stable EKG - PVCs new.   Other studies Reviewed: Additional studies/ records that were reviewed today include:  Recent Labs:   Lab Results  Component Value Date   CHOL 163 09/25/2014   HDL 57.60 09/25/2014    LDLCALC 75 09/25/2014   LDLDIRECT 96.0 03/20/2012   TRIG 154.0* 09/25/2014   CHOLHDL 3 09/25/2014     ASSESSMENT / PLAN: Problem List Items Addressed This Visit    SOB (shortness of breath) (Chronic)    Pending referral to Pulmonology -- cardiac assessment to date, not forthcoming.      Severe obesity (BMI >= 40) (HCC) (Chronic)    The patient understands the need to lose weight with diet and exercise. We have discussed specific strategies for this.      Irregular heart beat (Chronic)   Relevant Orders   EKG 12-Lead (Completed)   Hyperlipidemia with target LDL less than 100 (Chronic)    Stable on Statin + Fibrate. @ Goal.      Essential hypertension - Primary (Chronic)   Relevant Orders   EKG 12-Lead (Completed)   Chest pain on exertion (Chronic)    Non-obstructive CAD on Cath. -- Low likelihood for Macrovascular Ischemia - continue to Rx CAD RFs.      Relevant Orders   EKG 12-Lead (Completed)      Current medicines are reviewed at length with the patient today. (+/- concerns) none The following changes have been made: none  Studies Ordered:   Orders Placed This Encounter  Procedures  . EKG 12-Lead     ROV 12    HARDING, Leonie Green, M.D., M.S. Interventional Cardiologist   Pager # [phone]

## 2015-02-20 ENCOUNTER — Encounter: Payer: Self-pay | Admitting: Cardiology

## 2015-02-20 NOTE — Assessment & Plan Note (Signed)
Stable on Statin + Fibrate. @ Goal.

## 2015-02-20 NOTE — Assessment & Plan Note (Signed)
The patient understands the need to lose weight with diet and exercise. We have discussed specific strategies for this.  

## 2015-02-20 NOTE — Assessment & Plan Note (Signed)
Pending referral to Pulmonology -- cardiac assessment to date, not forthcoming.

## 2015-02-20 NOTE — Assessment & Plan Note (Signed)
Non-obstructive CAD on Cath. -- Low likelihood for Macrovascular Ischemia - continue to Rx CAD RFs.

## 2015-02-21 ENCOUNTER — Encounter: Payer: Self-pay | Admitting: Family Medicine

## 2015-03-25 ENCOUNTER — Other Ambulatory Visit: Payer: Self-pay | Admitting: Family Medicine

## 2015-03-25 NOTE — Telephone Encounter (Signed)
Requesting Tramadol 50mg  -Take 1 tablet by mouth every 8 hours as needed. Last refill:01-31-15;#45,0 Last OV:02-05-15 Please advise.//AB/CMA

## 2015-03-26 NOTE — Telephone Encounter (Signed)
Faxed hardcopy for Tramadol to [facility] Dr Letta Kocher [address]

## 2015-04-05 ENCOUNTER — Emergency Department (HOSPITAL_BASED_OUTPATIENT_CLINIC_OR_DEPARTMENT_OTHER): Payer: Medicare Other

## 2015-04-05 ENCOUNTER — Emergency Department (HOSPITAL_BASED_OUTPATIENT_CLINIC_OR_DEPARTMENT_OTHER)
Admission: EM | Admit: 2015-04-05 | Discharge: 2015-04-05 | Disposition: A | Discharge status: Home / Self Care | Payer: Medicare Other | Attending: Emergency Medicine | Admitting: Emergency Medicine

## 2015-04-05 ENCOUNTER — Encounter (HOSPITAL_BASED_OUTPATIENT_CLINIC_OR_DEPARTMENT_OTHER): Payer: Self-pay | Admitting: *Deleted

## 2015-04-05 DIAGNOSIS — F329 Major depressive disorder, single episode, unspecified: Secondary | ICD-10-CM | POA: Insufficient documentation

## 2015-04-05 DIAGNOSIS — I2510 Atherosclerotic heart disease of native coronary artery without angina pectoris: Secondary | ICD-10-CM | POA: Diagnosis not present

## 2015-04-05 DIAGNOSIS — Z87891 Personal history of nicotine dependence: Secondary | ICD-10-CM | POA: Diagnosis not present

## 2015-04-05 DIAGNOSIS — Z862 Personal history of diseases of the blood and blood-forming organs and certain disorders involving the immune mechanism: Secondary | ICD-10-CM | POA: Insufficient documentation

## 2015-04-05 DIAGNOSIS — I10 Essential (primary) hypertension: Secondary | ICD-10-CM | POA: Diagnosis not present

## 2015-04-05 DIAGNOSIS — E669 Obesity, unspecified: Secondary | ICD-10-CM | POA: Diagnosis not present

## 2015-04-05 DIAGNOSIS — M1990 Unspecified osteoarthritis, unspecified site: Secondary | ICD-10-CM | POA: Diagnosis not present

## 2015-04-05 DIAGNOSIS — M7662 Achilles tendinitis, left leg: Secondary | ICD-10-CM | POA: Diagnosis not present

## 2015-04-05 DIAGNOSIS — K219 Gastro-esophageal reflux disease without esophagitis: Secondary | ICD-10-CM | POA: Diagnosis not present

## 2015-04-05 DIAGNOSIS — Z9889 Other specified postprocedural states: Secondary | ICD-10-CM | POA: Insufficient documentation

## 2015-04-05 DIAGNOSIS — E785 Hyperlipidemia, unspecified: Secondary | ICD-10-CM | POA: Diagnosis not present

## 2015-04-05 DIAGNOSIS — M25572 Pain in left ankle and joints of left foot: Secondary | ICD-10-CM | POA: Diagnosis not present

## 2015-04-05 DIAGNOSIS — Z791 Long term (current) use of non-steroidal anti-inflammatories (NSAID): Secondary | ICD-10-CM | POA: Diagnosis not present

## 2015-04-05 DIAGNOSIS — M79672 Pain in left foot: Secondary | ICD-10-CM | POA: Diagnosis present

## 2015-04-05 DIAGNOSIS — Z79899 Other long term (current) drug therapy: Secondary | ICD-10-CM | POA: Diagnosis not present

## 2015-04-05 DIAGNOSIS — Z86010 Personal history of colonic polyps: Secondary | ICD-10-CM | POA: Insufficient documentation

## 2015-04-05 MED ORDER — NAPROXEN 250 MG PO TABS
500.0000 mg | ORAL_TABLET | Freq: Once | ORAL | Status: AC
  Administered 2015-04-05: 500 mg via ORAL
  Filled 2015-04-05: qty 2

## 2015-04-05 MED ORDER — NAPROXEN 500 MG PO TABS: 500.0000 mg | ORAL_TABLET | Freq: Two times a day (BID) | ORAL | Status: DC

## 2015-04-05 NOTE — ED Notes (Signed)
Pt reports pain in left foot since yesterday. Pain is lateral ankle and up back of ankle. Denies falls or injury. States unable to ambulate due to pain

## 2015-04-05 NOTE — ED Provider Notes (Signed)
CSN: [id number]     Arrival date & time 04/05/15  1140 History   First MD Initiated Contact with Patient 04/05/15 1250     Chief Complaint  Patient presents with  . Foot Pain     (Consider location/radiation/quality/duration/timing/severity/associated sxs/prior Treatment) The history is provided by the patient and medical records. No language interpreter was used.     Stephinie Battisti is a 70 y.o. female  with a hx of depression, HTN, GERD, NICM, CAD presents to the Emergency Department complaining of gradual, persistent, progressively worsening Left foot and ankle pain onset yesterday morning.  Pt denies falls or trauma.   Patient reports that her sister is dying and over the last several weeks she has been over to her home multiple times. She reports that there is a large and very steep hill that she must climb to get there. She reports she has been up and down the L many times each day over the last several weeks , thus aggravating the pain in her left ankle. Pt tool tramadol last night with minimal relief.  Pt with hz of tendonitis in the same foot which was treated with a CAM walker.  Associated symptoms include mild swelling.  Walking makes it worse.  Pt denies hx of gout, recent illness, fever, chills, nausea, vomiting, erythema, decreased ROM, weakness, dizziness, syncope.     Past Medical History  Diagnosis Date  . Depression   . Hyperlipidemia   . Hypertension   . GERD (gastroesophageal reflux disease)   . NICM (nonischemic cardiomyopathy) ([place])     Essentially resolved. Echocardiogram July 2013 showed normal EF greater than 55%. Important septal motion. Normal LV filling pressures. Mild MR and mild anterior MVP  . Coronary artery disease (CAD) excluded November 2015    Low Risk Myoview (false positive) with possible inferior-inferolateral ischemia, but with nonsustained VT --> CATH --> Angiographically Normal Coronary Arteries  . Obesity (BMI 30-39.9)   . Osteoarthritis   .  Positional vertigo   . Hyperplastic colon polyp   . Anemia   . Anxiety    Past Surgical History  Procedure Laterality Date  . Abdominal hysterectomy    . Gastric bypass  1980  . Appendectomy  as child  . Kidney stone surgery    . Exercise tolerance test - cpet-met-test  July 2013    Submaximal effort. Only 80% of her, therefore peak VO2 of 75% is likely an underestimate. High resting heart rate, achieving 86% of heart rate. --> Unable to interpret due to lack of effort.  . Pulmonary function tests  July 2013    Increased densities, decreased FVC - consistent with obesity hypoventilation; FEV1 is 58%, FVC 60% of predicted.  [name] Nm myoview ltd  November 2015    4:20 min, 4.6 METS --> DOE, but no chest discomfort; Significant + ST -T wave changes noted with NSVT & PVCs. Images suggest Inferior-inferolateral ischemia.  Low Risk. -- FALSE POSITIVE  . Colonoscopy    . Doppler echocardiography  July 2013    09/20/11. normal Lv thickness and function with EF greater thatn 55%  . Cardiac catheterization  November 2015    Angiographically normal coronaries  . Left heart catheterization with coronary angiogram N/A 02/01/2014    Procedure: LEFT HEART CATHETERIZATION WITH CORONARY ANGIOGRAM;  Surgeon: Leonie Man, MD;  Location: [facility] CATH LAB;  Service: Cardiovascular;  Laterality: N/A;  . Gastric bypass    . Cholecystectomy    . Upper gastrointestinal endoscopy  Family History  Problem Relation Age of Onset  . Colon cancer Maternal Aunt   . Lung cancer Sister     smoker  . Colon cancer Maternal Uncle   . Colon polyps Neg Hx   . Esophageal cancer Neg Hx   . Gallbladder disease Neg Hx   . Rectal cancer Neg Hx   . Stomach cancer Neg Hx   . Kidney disease Daughter   . Multiple sclerosis Daughter   . Diabetes Daughter     x3  . Heart disease Father   . Heart disease Mother   . Heart disease Paternal Grandfather   . Heart disease Maternal Aunt   . Colon cancer Other     early 65's.   Genetic testing from maternal side of family.   Social History  Substance Use Topics  . Smoking status: Former Smoker    Quit date: 01/18/2003  . Smokeless tobacco: Never Used  . Alcohol Use: 1.8 oz/week    3 Cans of beer per week     Comment: drinks 2-3 beers a week   OB History    No data available     Review of Systems  Constitutional: Negative for fever and chills.  Gastrointestinal: Negative for nausea and vomiting.  Musculoskeletal: Positive for joint swelling and arthralgias. Negative for back pain, neck pain and neck stiffness.  Skin: Negative for wound.  Neurological: Negative for numbness.  Hematological: Does not bruise/bleed easily.  Psychiatric/Behavioral: The patient is not nervous/anxious.   All other systems reviewed and are negative.     Allergies  Review of patient's allergies indicates no known allergies.  Home Medications   Prior to Admission medications   Medication Sig Start Date End Date Taking? Authorizing Provider  carvedilol (COREG) 12.5 MG tablet Take 1 tablet (12.5 mg total) by mouth 2 (two) times daily with a meal. 09/25/14  Yes Midge Minium, MD  metoCLOPramide (REGLAN) 5 MG tablet Take one po nightly 06/05/14  Yes Lafayette Dragon, MD  traMADol (ULTRAM) 50 MG tablet TAKE ONE TABLET BY MOUTH EVERY 8 HOURS AS NEEDED 03/26/15  Yes Midge Minium, MD  atorvastatin (LIPITOR) 20 MG tablet Take 1 tablet (20 mg total) by mouth daily. 09/25/14 10/19/15  Midge Minium, MD  fenofibrate 160 MG tablet Take 1 tablet (160 mg total) by mouth daily. 09/25/14   Midge Minium, MD  lisinopril-hydrochlorothiazide (PRINZIDE,ZESTORETIC) 20-25 MG per tablet Take 1 tablet by mouth daily. 09/25/14   Midge Minium, MD  meclizine (ANTIVERT) 50 MG tablet Take 1 tablet (50 mg total) by mouth 3 (three) times daily as needed for dizziness. 09/19/13   Midge Minium, MD  naproxen (NAPROSYN) 500 MG tablet Take 1 tablet (500 mg total) by mouth 2 (two) times daily  with a meal. 04/05/15   Mattalyn Anderegg, PA-C  nitroGLYCERIN (NITROSTAT) 0.4 MG SL tablet Place 1 tablet (0.4 mg total) under the tongue every 5 (five) minutes as needed for chest pain. 09/25/14   Midge Minium, MD  omeprazole (PRILOSEC) 40 MG capsule Take 1 capsule (40 mg total) by mouth daily. 09/25/14   Midge Minium, MD  ranitidine (ZANTAC) 300 MG tablet Take 1 tablet (300 mg total) by mouth at bedtime. 09/25/14   Midge Minium, MD   BP 151/77 mmHg  Pulse 50  Temp(Src) 97.5 F (36.4 C) (Oral)  Resp 18  Ht _0  (1.651 m)  Wt 106.595 kg  BMI 39.11 kg/m2  SpO2 97% Physical  Exam  Constitutional: She appears well-developed and well-nourished. No distress.  HENT:  Head: Normocephalic and atraumatic.  Eyes: Conjunctivae are normal.  Neck: Normal range of motion.  Cardiovascular: Normal rate, regular rhythm and intact distal pulses.   Capillary refill < 3 sec  Pulmonary/Chest: Effort normal and breath sounds normal.  Musculoskeletal: She exhibits tenderness. She exhibits no edema.   Left ankle: full range of motion with increased pain due to dorsiflexion;  No tenderness to the joint line , no deformity or ecchymosis,  significant tenderness to palpation over the insertion site of the Achilles tendon , no palpable defect,  Negative Thompson's test  Neurological: She is alert. Coordination normal.  Sensation intact to dull and sharp in the LLE Strength 5/5 in the LLE including dorsiflexion and plantar flexion  Skin: Skin is warm and dry. She is not diaphoretic.  No tenting of the skin  Psychiatric: She has a normal mood and affect.  Nursing note and vitals reviewed.   ED Course  Procedures (including critical care time)  Imaging Review Dg Ankle Complete Left  04/05/2015  CLINICAL DATA:  Left ankle pain for 2 days, no known injury, initial encounter EXAM: LEFT ANKLE COMPLETE - 3+ VIEW COMPARISON:  None. FINDINGS: There is no evidence of fracture, dislocation, or joint  effusion. There is no evidence of arthropathy or other focal bone abnormality. Soft tissues are unremarkable. IMPRESSION: No acute abnormality noted. Electronically Signed   By: Inez Catalina M.D.   On: 04/05/2015 13:30   Dg Foot Complete Left  04/05/2015  CLINICAL DATA:  Foot pain, no known injury, initial encounter EXAM: LEFT FOOT - COMPLETE 3+ VIEW COMPARISON:  None. FINDINGS: There is no evidence of fracture or dislocation. There is no evidence of arthropathy or other focal bone abnormality. Soft tissues are unremarkable. IMPRESSION: No acute abnormality noted. Electronically Signed   By: Inez Catalina M.D.   On: 04/05/2015 13:30   I have personally reviewed and evaluated these images and lab results as part of my medical decision-making.  MDM   Final diagnoses:  Tendonitis, Achilles, left   Laroy Apple presents  With left ankle pain and exam consistent with Achilles tendinitis. Patient was previously treated for this with a cam walker. As she will need to continue to climb the hill at her sister's house we will place her in a cam walker today. I have recommended usage of a cane to assist with weightbearing. I have also recommended naproxen usage with food. Patient denies history of GI bleed , PUD or current use of blood thinners. Warning signs for gastritis and peptic ulcer discussed. Patient will follow with her primary care physician for further evaluation and treatment.  Jarrett Soho Luvern Mischke, PA-C 04/05/15 [place], MD 04/06/15 [phone]

## 2015-04-05 NOTE — Discharge Instructions (Signed)
1. Medications: alternate naprosyn and tylenol for pain control, usual home medications 2. Treatment: rest, ice, elevate and use brace, drink plenty of fluids, gentle stretching 3. Follow Up: Please followup with orthopedics as directed or your PCP in 1 week if no improvement for discussion of your diagnoses and further evaluation after today's visit; if you do not have a primary care doctor use the resource guide provided to find one; Please return to the ER for worsening symptoms or other concerns    Achilles Tendinitis Achilles tendinitis is inflammation of the tough, cord-like band that attaches the lower muscles of your leg to your heel (Achilles tendon). It is usually caused by overusing the tendon and joint involved.  CAUSES Achilles tendinitis can happen because of:  A sudden increase in exercise or activity (such as running).  Doing the same exercises or activities (such as jumping) over and over.  Not warming up calf muscles before exercising.  Exercising in shoes that are worn out or not made for exercise.  Having arthritis or a bone growth on the back of the heel bone. This can rub against the tendon and hurt the tendon. SIGNS AND SYMPTOMS The most common symptoms are:  Pain in the back of the leg, just above the heel. The pain usually gets worse with exercise and better with rest.  Stiffness or soreness in the back of the leg, especially in the morning.  Swelling of the skin over the Achilles tendon.  Trouble standing on tiptoe. Sometimes, an Achilles tendon tears (ruptures). Symptoms of an Achilles tendon rupture can include:  Sudden, severe pain in the back of the leg.  Trouble putting weight on the foot or walking normally. DIAGNOSIS Achilles tendinitis will be diagnosed based on symptoms and a physical examination. An X-ray may be done to check if another condition is causing your symptoms. An MRI may be ordered if your health care provider suspects you may have  completely torn your tendon, which is called an Achilles tendon rupture.  TREATMENT  Achilles tendinitis usually gets better over time. It can take weeks to months to heal completely. Treatment focuses on treating the symptoms and helping the injury heal. HOME CARE INSTRUCTIONS   Rest your Achilles tendon and avoid activities that cause pain.  Apply ice to the injured area:  Put ice in a plastic bag.  Place a towel between your skin and the bag.  Leave the ice on for 20 minutes, 2-3 times a day  Try to avoid using the tendon (other than gentle range of motion) while the tendon is painful. Do not resume use until instructed by your health care provider. Then begin use gradually. Do not increase use to the point of pain. If pain does develop, decrease use and continue the above measures. Gradually increase activities that do not cause discomfort until you achieve normal use.  Do exercises to make your calf muscles stronger and more flexible. Your health care provider or physical therapist can recommend exercises for you to do.  Wrap your ankle with an elastic bandage or other wrap. This can help keep your tendon from moving too much. Your health care provider will show you how to wrap your ankle correctly.  Only take over-the-counter or prescription medicines for pain, discomfort, or fever as directed by your health care provider. SEEK MEDICAL CARE IF:   Your pain and swelling increase or pain is uncontrolled with medicines.  You develop new, unexplained symptoms or your symptoms get worse.  You  are unable to move your toes or foot.  You develop warmth and swelling in your foot.  You have an unexplained temperature. MAKE SURE YOU:   Understand these instructions.  Will watch your condition.  Will get help right away if you are not doing well or get worse.   This information is not intended to replace advice given to you by your health care provider. Make sure you discuss any  questions you have with your health care provider.   Document Released: 12/09/2004 Document Revised: 03/22/2014 Document Reviewed: 10/11/2012 Elsevier Interactive Patient Education [employer].

## 2015-04-25 ENCOUNTER — Institutional Professional Consult (permissible substitution): Payer: Medicare Other | Admitting: Internal Medicine

## 2015-04-28 DIAGNOSIS — H2513 Age-related nuclear cataract, bilateral: Secondary | ICD-10-CM | POA: Diagnosis not present

## 2015-04-28 DIAGNOSIS — H3561 Retinal hemorrhage, right eye: Secondary | ICD-10-CM | POA: Diagnosis not present

## 2015-04-29 ENCOUNTER — Encounter: Payer: Self-pay | Admitting: Internal Medicine

## 2015-04-29 ENCOUNTER — Ambulatory Visit (INDEPENDENT_AMBULATORY_CARE_PROVIDER_SITE_OTHER): Payer: Medicare Other | Admitting: Internal Medicine

## 2015-04-29 ENCOUNTER — Other Ambulatory Visit: Payer: Medicare Other

## 2015-04-29 VITALS — BP 128/62 | HR 76 | Ht 65.0 in | Wt 230.0 lb

## 2015-04-29 DIAGNOSIS — R0689 Other abnormalities of breathing: Secondary | ICD-10-CM | POA: Diagnosis not present

## 2015-04-29 DIAGNOSIS — R0600 Dyspnea, unspecified: Secondary | ICD-10-CM | POA: Diagnosis not present

## 2015-04-29 DIAGNOSIS — J439 Emphysema, unspecified: Secondary | ICD-10-CM | POA: Diagnosis not present

## 2015-04-29 DIAGNOSIS — J449 Chronic obstructive pulmonary disease, unspecified: Secondary | ICD-10-CM

## 2015-04-29 HISTORY — DX: Emphysema, unspecified: J43.9

## 2015-04-29 MED ORDER — PREDNISONE 10 MG PO TABS: ORAL_TABLET | ORAL | Status: DC

## 2015-04-29 NOTE — Progress Notes (Signed)
Subjective:    Patient ID: April Combs, female    DOB: Aug 31, 1945, 70 y.o.   MRN: [id number] PCP Annye Asa, MD   HPI  IOV 04/29/2015  Chief Complaint  Patient presents with  . pulmonary consult    pt. ref by dr Birdie Riddle. pt. pt. dx with emohysema in 2016. c/o occ. SOB. prod. cough clear to yellow in color. occ. wheezing. denies chest pain/tightness.   70 year-old morbidly obese female referred for emphysema and shortness of breath evaluation. Her youngest sister at age 21 April Combs passed away 3 weeks ago from lung cancer and apparently I had taken care of her therefore she has been referred to me. She is here with her husband. She reports insidious onset of dyspnea that was really mild for a few years but for the last year and a half it has progressively gotten worse that symptoms are moderate to severe. Making her bed makes her dyspneic. During this time her obesity has not changed and no no weight change. Husband reports any wheezing but patient denies this. Symptoms are associated with cough and yellow sputum. Respirator symptom burden is high with COPD cat score of 24 as documented below. There is no chest pain. 09/25/2014 chest CT low dose CT scan for lung cancer screening that did not show any lung cancer. She needs a repeat scan in July 2017. There was some emphysema and she is concerned  Symptoms are associated with both ACE inhibitor and beta blocker intake for the last few years by Dr. Ellyn Hack of cardiology. She does not think her symptoms are related to this.  Lab work hemoglobin 13.3 g percent July 2016 with a creatinine of 0.85 mg July 2016.  Overall she's not interested in medication therapy. She is not interested inhalation therapy or oxygen therapy or even pulmonary rehabilitation.  CAT COPD Symptom & Quality of Life Score ([employer] trademark) 0 is no burden. 5 is highest burden 04/29/2015   Never Cough -> Cough all the time 4  No phlegm in chest -> Chest is full  of phlegm 3  No chest tightness -> Chest feels very tight 3  No dyspnea for 1 flight stairs/hill -> Very dyspneic for 1 flight of stairs 5  No limitations for ADL at home -> Very limited with ADL at home 3  Confident leaving home -> Not at all confident leaving home 0  Sleep soundly -> Do not sleep soundly because of lung condition 3  Lots of Energy -> No energy at all 3  TOTAL Score (max 40)  24         has a past medical history of Depression; Hyperlipidemia; Hypertension; GERD (gastroesophageal reflux disease); NICM (nonischemic cardiomyopathy) ([place]); Coronary artery disease (CAD) excluded (November 2015); Obesity (BMI 30-39.9); Osteoarthritis; Positional vertigo; Hyperplastic colon polyp; Anemia; and Anxiety.   reports that she quit smoking about 12 years ago. She has never used smokeless tobacco.  Past Surgical History  Procedure Laterality Date  . Abdominal hysterectomy    . Gastric bypass  1980  . Appendectomy  as child  . Kidney stone surgery    . Exercise tolerance test - cpet-met-test  July 2013    Submaximal effort. Only 80% of her, therefore peak VO2 of 75% is likely an underestimate. High resting heart rate, achieving 86% of heart rate. --> Unable to interpret due to lack of effort.  . Pulmonary function tests  July 2013    Increased densities, decreased FVC - consistent with  obesity hypoventilation; FEV1 is 58%, FVC 60% of predicted.  [name] Nm myoview ltd  November 2015    4:20 min, 4.6 METS --> DOE, but no chest discomfort; Significant + ST -T wave changes noted with NSVT & PVCs. Images suggest Inferior-inferolateral ischemia.  Low Risk. -- FALSE POSITIVE  . Colonoscopy    . Doppler echocardiography  July 2013    09/20/11. normal Lv thickness and function with EF greater thatn 55%  . Cardiac catheterization  November 2015    Angiographically normal coronaries  . Left heart catheterization with coronary angiogram N/A 02/01/2014    Procedure: LEFT HEART CATHETERIZATION WITH  CORONARY ANGIOGRAM;  Surgeon: Leonie Man, MD;  Location: [facility] CATH LAB;  Service: Cardiovascular;  Laterality: N/A;  . Gastric bypass    . Cholecystectomy    . Upper gastrointestinal endoscopy      No Known Allergies  Immunization History  Administered Date(s) Administered  . Influenza, High Dose Seasonal PF 01/28/2015  . Pneumococcal Conjugate-13 09/19/2013  . Pneumococcal Polysaccharide-23 09/25/2014  . Tdap 02/29/2008    Family History  Problem Relation Age of Onset  . Colon cancer Maternal Aunt   . Lung cancer Sister     smoker  . Colon cancer Maternal Uncle   . Colon polyps Neg Hx   . Esophageal cancer Neg Hx   . Gallbladder disease Neg Hx   . Rectal cancer Neg Hx   . Stomach cancer Neg Hx   . Kidney disease Daughter   . Multiple sclerosis Daughter   . Diabetes Daughter     x3  . Heart disease Father   . Heart disease Mother   . Heart disease Paternal Grandfather   . Heart disease Maternal Aunt   . Colon cancer Other     early 70's.  Genetic testing from maternal side of family.     Current outpatient prescriptions:  .  carvedilol (COREG) 12.5 MG tablet, , Disp: , Rfl:  .  lisinopril-hydrochlorothiazide (PRINZIDE,ZESTORETIC) 20-25 MG per tablet, Take 1 tablet by mouth daily., Disp: 90 tablet, Rfl: 3 .  meclizine (ANTIVERT) 50 MG tablet, Take 1 tablet (50 mg total) by mouth 3 (three) times daily as needed for dizziness., Disp: 60 tablet, Rfl: 3 .  metoCLOPramide (REGLAN) 5 MG tablet, Take one po nightly, Disp: 30 tablet, Rfl: 1 .  nitroGLYCERIN (NITROSTAT) 0.4 MG SL tablet, Place 1 tablet (0.4 mg total) under the tongue every 5 (five) minutes as needed for chest pain., Disp: 100 tablet, Rfl: 3 .  traMADol (ULTRAM) 50 MG tablet, TAKE ONE TABLET BY MOUTH EVERY 8 HOURS AS NEEDED, Disp: 45 tablet, Rfl: 0      Review of Systems  Constitutional: Negative for fever and unexpected weight change.  HENT: Positive for sinus pressure. Negative for congestion, dental  problem, ear pain, nosebleeds, postnasal drip, rhinorrhea, sneezing, sore throat and trouble swallowing.   Eyes: Negative for redness and itching.  Respiratory: Negative for cough, chest tightness, shortness of breath and wheezing.   Cardiovascular: Positive for leg swelling. Negative for palpitations.  Gastrointestinal: Negative for nausea and vomiting.  Genitourinary: Negative for dysuria.  Musculoskeletal: Negative for joint swelling.  Skin: Negative for rash.  Neurological: Negative for headaches.  Hematological: Does not bruise/bleed easily.  Psychiatric/Behavioral: Negative for dysphoric mood. The patient is not nervous/anxious.        Objective:   Physical Exam  Constitutional: She is oriented to person, place, and time. She appears well-developed and well-nourished. No distress.  obese  HENT:  Head: Normocephalic and atraumatic.  Right Ear: External ear normal.  Left Ear: External ear normal.  Mouth/Throat: Oropharynx is clear and moist. No oropharyngeal exudate.  Eyes: Conjunctivae and EOM are normal. Pupils are equal, round, and reactive to light. Right eye exhibits no discharge. Left eye exhibits no discharge. No scleral icterus.  Neck: Normal range of motion. Neck supple. No JVD present. No tracheal deviation present. No thyromegaly present.  Cardiovascular: Normal rate, regular rhythm, normal heart sounds and intact distal pulses.  Exam reveals no gallop and no friction rub.   No murmur heard. Pulmonary/Chest: Effort normal. No respiratory distress. She has wheezes. She has no rales. She exhibits no tenderness.   very occasional scattered wheeze  Abdominal: Soft. Bowel sounds are normal. She exhibits no distension and no mass. There is no tenderness. There is no rebound and no guarding.  Musculoskeletal: Normal range of motion. She exhibits no edema or tenderness.  Lymphadenopathy:    She has no cervical adenopathy.  Neurological: She is alert and oriented to person,  place, and time. She has normal reflexes. No cranial nerve deficit. She exhibits normal muscle tone. Coordination normal.  Skin: Skin is warm and dry. No rash noted. She is not diaphoretic. No erythema. No pallor.  Psychiatric:  Flat affect  Vitals reviewed.   Filed Vitals:   04/29/15 1411  BP: 128/62  Pulse: 76  Height: _0  (1.651 m)  Weight: 230 lb (104.327 kg)  SpO2: 94%   Estimated body mass index is 38.27 kg/(m^2) as calculated from the following:   Height as of this encounter: _1  (1.651 m).   Weight as of this encounter: 230 lb (104.327 kg).        Assessment & Plan:     ICD-9-CM ICD-10-CM   1. Dyspnea and respiratory abnormality 786.09 R06.00     R06.89   2. Pulmonary emphysema, unspecified emphysema type (HCC) 492.8 J43.9     Shortness of breath is multifactorial which means from many causes of which emphysema is 1 but I suspect your weight, physical deconditioning and blood pressure medications are playing a role  Plan  Do alpha 1 genetic test for emphysema  Please take prednisone 40 mg x1 day, then 30 mg x1 day, then 20 mg x1 day, then 10 mg x1 day, and then 5 mg x1 day and stop  Will have Dr Ellyn Hack look at alternatives for coreg and lisinopril for bp because these can interefere with emphysema/cough  Respect your decision to refuse pulmonary rehabilitation therapy and inhaler therapy buit just prefer to do the above for now  Do full pulmonary function test  Follow-up - Full pulmonary function test first available - Return to see me after full pulmonary function test or my nurse practitioner   Dr. Brand Males, M.D., [facility].C.P Pulmonary and Critical Care Medicine Staff Physician [place] [facility] Pager: [phone], If no answer or between  15:00h - 7:00h: call [phone]  04/29/2015 2:40 PM

## 2015-04-29 NOTE — Patient Instructions (Signed)
ICD-9-CM ICD-10-CM   1. Dyspnea and respiratory abnormality 786.09 R06.00     R06.89   2. Pulmonary emphysema, unspecified emphysema type (HCC) 492.8 J43.9    Shortness of breath is multifactorial which means from many causes of which emphysema is 1 but I suspect your weight, physical deconditioning and blood pressure medications are playing a role  Plan  Do alpha 1 genetic test for emphysema  Please take prednisone 40 mg x1 day, then 30 mg x1 day, then 20 mg x1 day, then 10 mg x1 day, and then 5 mg x1 day and stop  Will have Dr Ellyn Hack look at alternatives for coreg and lisinopril for bp because these can interefere with emphysema/cough  Respect your decision to refuse pulmonary rehabilitation therapy and inhaler therapy  Do full pulmonary function test  Follow-up - Full pulmonary function test first available - Return to see me after full pulmonary function test or my nurse practitioner

## 2015-05-03 LAB — ALPHA-1 ANTITRYPSIN PHENOTYPE: A-1 Antitrypsin: 62 mg/dL — ABNORMAL LOW (ref 83–199)

## 2015-05-04 ENCOUNTER — Telehealth: Payer: Self-pay | Admitting: Internal Medicine

## 2015-05-04 NOTE — Telephone Encounter (Signed)
alppha 1 level is LOW (thought gene is normal). She will definitely need to do the PFTs we recommended and return for fu

## 2015-06-02 ENCOUNTER — Ambulatory Visit (INDEPENDENT_AMBULATORY_CARE_PROVIDER_SITE_OTHER): Payer: Medicare Other | Admitting: Internal Medicine

## 2015-06-02 DIAGNOSIS — J439 Emphysema, unspecified: Secondary | ICD-10-CM | POA: Diagnosis not present

## 2015-06-02 LAB — PULMONARY FUNCTION TEST
DL/VA % pred: 102 %
DL/VA: 5.02 ml/min/mmHg/L
DLCO UNC: 19.72 ml/min/mmHg
DLCO cor % pred: 76 %
DLCO cor: 19.54 ml/min/mmHg
DLCO unc % pred: 77 %
FEF 25-75 POST: 1.1 L/s
FEF 25-75 Pre: 0.66 L/sec
FEF2575-%CHANGE-POST: 67 %
FEF2575-%PRED-PRE: 32 %
FEF2575-%Pred-Post: 54 %
FEV1-%Change-Post: 19 %
FEV1-%Pred-Post: 56 %
FEV1-%Pred-Pre: 47 %
FEV1-Post: 1.36 L
FEV1-Pre: 1.14 L
FEV1FVC-%CHANGE-POST: 0 %
FEV1FVC-%PRED-PRE: 86 %
FEV6-%Change-Post: 21 %
FEV6-%Pred-Post: 67 %
FEV6-%Pred-Pre: 55 %
FEV6-Post: 2.05 L
FEV6-Pre: 1.68 L
FEV6FVC-%Change-Post: 1 %
FEV6FVC-%PRED-POST: 103 %
FEV6FVC-%Pred-Pre: 102 %
FVC-%CHANGE-POST: 18 %
FVC-%PRED-PRE: 54 %
FVC-%Pred-Post: 65 %
FVC-PRE: 1.74 L
FVC-Post: 2.06 L
PRE FEV1/FVC RATIO: 66 %
Post FEV1/FVC ratio: 66 %
Post FEV6/FVC ratio: 100 %
Pre FEV6/FVC Ratio: 98 %
RV % PRED: 135 %
RV: 3 L
TLC % pred: 94 %
TLC: 4.91 L

## 2015-06-02 NOTE — Progress Notes (Signed)
PFT done today. 

## 2015-06-11 ENCOUNTER — Ambulatory Visit: Payer: Medicare Other | Admitting: Internal Medicine

## 2015-06-24 ENCOUNTER — Other Ambulatory Visit: Payer: Self-pay | Admitting: Family Medicine

## 2015-06-24 NOTE — Telephone Encounter (Signed)
Okay #45, no refills

## 2015-06-24 NOTE — Telephone Encounter (Signed)
Last OV 02/05/15 Tramadol last filled 03/26/15 #45 with 0

## 2015-06-24 NOTE — Telephone Encounter (Signed)
Rx faxed to [facility]  

## 2015-07-21 DIAGNOSIS — H40013 Open angle with borderline findings, low risk, bilateral: Secondary | ICD-10-CM | POA: Diagnosis not present

## 2015-10-02 ENCOUNTER — Encounter: Payer: Medicare Other | Admitting: Family Medicine

## 2015-10-08 ENCOUNTER — Encounter: Payer: Self-pay | Admitting: Family Medicine

## 2015-10-15 ENCOUNTER — Encounter: Payer: Self-pay | Admitting: Family Medicine

## 2015-10-15 ENCOUNTER — Ambulatory Visit (INDEPENDENT_AMBULATORY_CARE_PROVIDER_SITE_OTHER): Payer: Medicare Other | Admitting: Family Medicine

## 2015-10-15 VITALS — BP 138/62 | HR 78 | Temp 97.9°F | Resp 16 | Ht 65.0 in | Wt 228.4 lb

## 2015-10-15 DIAGNOSIS — I10 Essential (primary) hypertension: Secondary | ICD-10-CM

## 2015-10-15 DIAGNOSIS — Z0000 Encounter for general adult medical examination without abnormal findings: Secondary | ICD-10-CM

## 2015-10-15 DIAGNOSIS — E66811 Obesity, class 1: Secondary | ICD-10-CM | POA: Insufficient documentation

## 2015-10-15 DIAGNOSIS — J439 Emphysema, unspecified: Secondary | ICD-10-CM

## 2015-10-15 DIAGNOSIS — E669 Obesity, unspecified: Secondary | ICD-10-CM

## 2015-10-15 HISTORY — DX: Morbid (severe) obesity due to excess calories: E66.01

## 2015-10-15 LAB — LIPID PANEL
CHOLESTEROL: 281 mg/dL — AB (ref 0–200)
HDL: 59.4 mg/dL (ref >39.00)
NonHDL: 222.06
TRIGLYCERIDES: 298 mg/dL — AB (ref 0.0–149.0)
Total CHOL/HDL Ratio: 5
VLDL: 59.6 mg/dL — ABNORMAL HIGH (ref 0.0–40.0)

## 2015-10-15 LAB — LDL CHOLESTEROL, DIRECT: LDL DIRECT: 172 mg/dL

## 2015-10-15 LAB — BASIC METABOLIC PANEL
BUN: 17 mg/dL (ref 6–23)
CALCIUM: 9.4 mg/dL (ref 8.4–10.5)
CHLORIDE: 103 meq/L (ref 96–112)
CO2: 27 meq/L (ref 19–32)
CREATININE: 0.66 mg/dL (ref 0.40–1.20)
GFR: 94.33 mL/min (ref >60.00)
GLUCOSE: 94 mg/dL (ref 70–99)
Potassium: 4.3 mEq/L (ref 3.5–5.1)
Sodium: 141 mEq/L (ref 135–145)

## 2015-10-15 LAB — HEPATIC FUNCTION PANEL
ALBUMIN: 4.2 g/dL (ref 3.5–5.2)
ALT: 12 U/L (ref 0–35)
AST: 14 U/L (ref 0–37)
Alkaline Phosphatase: 58 U/L (ref 39–117)
BILIRUBIN TOTAL: 0.4 mg/dL (ref 0.2–1.2)
Bilirubin, Direct: 0.1 mg/dL (ref 0.0–0.3)
TOTAL PROTEIN: 6.9 g/dL (ref 6.0–8.3)

## 2015-10-15 LAB — CBC WITH DIFFERENTIAL/PLATELET
BASOS ABS: 0 10*3/uL (ref 0.0–0.1)
BASOS PCT: 0.7 % (ref 0.0–3.0)
EOS ABS: 0.3 10*3/uL (ref 0.0–0.7)
Eosinophils Relative: 5 % (ref 0.0–5.0)
HCT: 38.1 % (ref 36.0–46.0)
HEMOGLOBIN: 13.1 g/dL (ref 12.0–15.0)
Lymphocytes Relative: 32.2 % (ref 12.0–46.0)
Lymphs Abs: 2.1 10*3/uL (ref 0.7–4.0)
MCHC: 34.5 g/dL (ref 30.0–36.0)
MCV: 91.3 fl (ref 78.0–100.0)
MONO ABS: 0.5 10*3/uL (ref 0.1–1.0)
Monocytes Relative: 7.1 % (ref 3.0–12.0)
Neutro Abs: 3.6 10*3/uL (ref 1.4–7.7)
Neutrophils Relative %: 55 % (ref 43.0–77.0)
Platelets: 196 10*3/uL (ref 150.0–400.0)
RBC: 4.17 Mil/uL (ref 3.87–5.11)
RDW: 13.4 % (ref 11.5–15.5)
WBC: 6.6 10*3/uL (ref 4.0–10.5)

## 2015-10-15 LAB — TSH: TSH: 3.18 u[IU]/mL (ref 0.35–4.50)

## 2015-10-15 MED ORDER — LISINOPRIL-HYDROCHLOROTHIAZIDE 20-25 MG PO TABS: 1.0000 | ORAL_TABLET | Freq: Every day | ORAL | 3 refills | Status: DC

## 2015-10-15 MED ORDER — TRAMADOL HCL 50 MG PO TABS: 50.0000 mg | ORAL_TABLET | Freq: Three times a day (TID) | ORAL | 0 refills | Status: DC | PRN

## 2015-10-15 NOTE — Progress Notes (Signed)
Pre visit review using our clinic review tool, if applicable. No additional management support is needed unless otherwise documented below in the visit note. 

## 2015-10-15 NOTE — Progress Notes (Signed)
   Subjective:    Patient ID: April Combs, female    DOB: Apr 23, 1945, 70 y.o.   MRN: [id number]  HPI Here today for CPE.  Risk Factors: HTN- chronic problem, on Coreg, Lisinopril-HCTZ daily w/ adequate control.  Denies CP, SOB above baseline, HAs, visual changes, edema. Obesity- chronic problem, pt has lost 2 more lbs.  Not exercising regularly.  Not following a particular diet. Emphysema- chronic problem, following w/ Dr Chase Caller.  Pt states she's not going back b/c 'all he wanted to do was put me on inhalers and have me do an exercise program.  I'll just wait until I can't breathe before I do that'. Physical Activity: limited due to emphysema Fall Risk: low Depression: denies  Hearing: normal to conversational tones, mildly decreased to whispered voice ADL's: independent Cognitive: normal linear thought process, memory and attention intact Home Safety: safe at home, lives w/ husband Height, Weight, BMI, Visual Acuity: see vitals, vision corrected to 20/20 w/ glasses Counseling: UTD on colonoscopy (due 2023), declines mammo and DEXA- 'just b/c it takes time out of your day and w/ [name] on dialysis it messes up everything'.  No need for pap.  UTD on immunizations Care team reviewed and updated w/ pt Labs Ordered: See A&P Care Plan: See A&P    Review of Systems Patient reports no vision/ hearing changes, adenopathy,fever, weight change,  persistant/recurrent hoarseness , swallowing issues, chest pain, palpitations, edema, persistant/recurrent cough, hemoptysis, dyspnea (rest/exertional/paroxysmal nocturnal), gastrointestinal bleeding (melena, rectal bleeding), abdominal pain, significant heartburn, GU symptoms (dysuria, hematuria, incontinence), Gyn symptoms (abnormal  bleeding, pain),  syncope, focal weakness, memory loss, numbness & tingling, skin/hair/nail changes, abnormal bruising or bleeding, anxiety, or depression.   + chronic diarrhea    Objective:   Physical Exam General  Appearance:    Alert, cooperative, no distress, appears stated age, obese  Head:    Normocephalic, without obvious abnormality, atraumatic  Eyes:    PERRL, conjunctiva/corneas clear, EOM's intact, fundi    benign, both eyes  Ears:    Normal TM's and external ear canals, both ears  Nose:   Nares normal, septum midline, mucosa normal, no drainage    or sinus tenderness  Throat:   Lips, mucosa, and tongue normal; teeth and gums normal  Neck:   Supple, symmetrical, trachea midline, no adenopathy;    Thyroid: no enlargement/tenderness/nodules  Back:     Symmetric, no curvature, ROM normal, no CVA tenderness  Lungs:     Clear to auscultation bilaterally, respirations unlabored  Chest Wall:    No tenderness or deformity   Heart:    Regular rate and rhythm, S1 and S2 normal, no murmur, rub   or gallop  Breast Exam:    Deferred at pt's request  Abdomen:     Soft, non-tender, bowel sounds active all four quadrants,    no masses, no organomegaly  Genitalia:    Deferred  Rectal:    Extremities:   Extremities normal, atraumatic, no cyanosis or edema  Pulses:   2+ and symmetric all extremities  Skin:   Skin color, texture, turgor normal, no rashes or lesions  Lymph nodes:   Cervical, supraclavicular, and axillary nodes normal  Neurologic:   CNII-XII intact, normal strength, sensation and reflexes    throughout          Assessment & Plan:

## 2015-10-15 NOTE — Patient Instructions (Signed)
Follow up in 6 months to recheck BP We'll notify you of your lab results and make any changes if needed Continue to work on healthy diet and regular exercise- you can do it! Please consider the mammogram- this is very important! You are up to date on colonoscopy until 2023 Call with any questions or concerns Enjoy the rest of your summer!!!

## 2015-10-16 ENCOUNTER — Other Ambulatory Visit: Payer: Self-pay | Admitting: General Practice

## 2015-10-16 DIAGNOSIS — E785 Hyperlipidemia, unspecified: Secondary | ICD-10-CM

## 2015-10-16 MED ORDER — ATORVASTATIN CALCIUM 20 MG PO TABS
20.0000 mg | ORAL_TABLET | Freq: Every day | ORAL | 6 refills | Status: DC
Start: 2015-10-16 — End: 2016-02-18

## 2015-10-21 NOTE — Assessment & Plan Note (Signed)
Chronic problem.  Adequate control.  Asymptomatic.  Stressed need for healthy diet and regular exercise.  Check labs.  No anticipated med changes.

## 2015-10-21 NOTE — Assessment & Plan Note (Signed)
Pt's PE unchanged from previous.  UTD on colonoscopy.  Declines mammo at this time- strongly urged her to reconsider.  UTD on immunizations.  Written screening schedule updated and given to pt.  Check labs.  Anticipatory guidance provided.

## 2015-10-21 NOTE — Assessment & Plan Note (Signed)
Relatively new dx.  Pt saw Pulmonary but does not agree w/ their treatment plan.  Refusing to go back.  Refusing to use inhalers.  Her plan is to wait until her sxs worsen.  She is not willing to discuss this further today.  Will revisit this at future appts.

## 2015-10-21 NOTE — Assessment & Plan Note (Signed)
>>  ASSESSMENT AND PLAN FOR PULMONARY EMPHYSEMA (HCC) WRITTEN ON 10/21/2015  4:30 PM BY TABORI, KATHERINE E, MD  Relatively new dx.  Pt saw Pulmonary but does not agree w/ their treatment plan.  Refusing to go back.  Refusing to use inhalers.  Her plan is to wait until her sxs worsen.  She is not willing to discuss this further today.  Will revisit this at future appts.

## 2015-10-21 NOTE — Assessment & Plan Note (Signed)
Ongoing issue for pt.  Stressed need for healthy diet and regular exercise.  Check labs to risk stratify.  Will follow 

## 2015-12-23 ENCOUNTER — Other Ambulatory Visit: Payer: Self-pay | Admitting: Family Medicine

## 2015-12-23 DIAGNOSIS — I10 Essential (primary) hypertension: Secondary | ICD-10-CM

## 2015-12-24 DIAGNOSIS — Z23 Encounter for immunization: Secondary | ICD-10-CM | POA: Diagnosis not present

## 2016-01-01 ENCOUNTER — Encounter: Payer: Self-pay | Admitting: Family Medicine

## 2016-02-18 ENCOUNTER — Encounter: Payer: Self-pay | Admitting: Cardiology

## 2016-02-18 ENCOUNTER — Ambulatory Visit (INDEPENDENT_AMBULATORY_CARE_PROVIDER_SITE_OTHER): Payer: Medicare Other | Admitting: Cardiology

## 2016-02-18 VITALS — BP 130/88 | HR 97 | Ht 65.0 in | Wt 234.6 lb

## 2016-02-18 DIAGNOSIS — Z79899 Other long term (current) drug therapy: Secondary | ICD-10-CM | POA: Diagnosis not present

## 2016-02-18 DIAGNOSIS — I10 Essential (primary) hypertension: Secondary | ICD-10-CM

## 2016-02-18 DIAGNOSIS — I499 Cardiac arrhythmia, unspecified: Secondary | ICD-10-CM | POA: Diagnosis not present

## 2016-02-18 DIAGNOSIS — R0600 Dyspnea, unspecified: Secondary | ICD-10-CM

## 2016-02-18 DIAGNOSIS — R0689 Other abnormalities of breathing: Secondary | ICD-10-CM

## 2016-02-18 DIAGNOSIS — J432 Centrilobular emphysema: Secondary | ICD-10-CM | POA: Diagnosis not present

## 2016-02-18 DIAGNOSIS — R9439 Abnormal result of other cardiovascular function study: Secondary | ICD-10-CM

## 2016-02-18 DIAGNOSIS — E6609 Other obesity due to excess calories: Secondary | ICD-10-CM

## 2016-02-18 DIAGNOSIS — E785 Hyperlipidemia, unspecified: Secondary | ICD-10-CM

## 2016-02-18 DIAGNOSIS — R079 Chest pain, unspecified: Secondary | ICD-10-CM | POA: Diagnosis not present

## 2016-02-18 LAB — COMPREHENSIVE METABOLIC PANEL
ALT: 15 U/L (ref 6–29)
AST: 18 U/L (ref 10–35)
Albumin: 4.1 g/dL (ref 3.6–5.1)
Alkaline Phosphatase: 52 U/L (ref 33–130)
BILIRUBIN TOTAL: 0.4 mg/dL (ref 0.2–1.2)
BUN: 33 mg/dL — ABNORMAL HIGH (ref 7–25)
CALCIUM: 9.4 mg/dL (ref 8.6–10.4)
CO2: 23 mmol/L (ref 20–31)
CREATININE: 0.77 mg/dL (ref 0.50–0.99)
Chloride: 105 mmol/L (ref 98–110)
GLUCOSE: 94 mg/dL (ref 65–99)
Potassium: 4.7 mmol/L (ref 3.5–5.3)
SODIUM: 142 mmol/L (ref 135–146)
Total Protein: 6.6 g/dL (ref 6.1–8.1)

## 2016-02-18 LAB — LIPID PANEL
CHOL/HDL RATIO: 3.1 ratio (ref <5.0)
Cholesterol: 186 mg/dL (ref <200)
HDL: 60 mg/dL (ref >50)
LDL Cholesterol: 56 mg/dL (ref <100)
Triglycerides: 348 mg/dL — ABNORMAL HIGH (ref <150)
VLDL: 70 mg/dL — ABNORMAL HIGH (ref <30)

## 2016-02-18 MED ORDER — ATORVASTATIN CALCIUM 20 MG PO TABS: 20.0000 mg | ORAL_TABLET | Freq: Every day | ORAL | 3 refills | Status: DC

## 2016-02-18 NOTE — Patient Instructions (Signed)
Start taking LINSOPRIL -HCTZ at lunch time daily. No other changes with medications   Labs- do not eat or Drink the morning of the test---CMP ,LIPIDS    Your physician wants you to follow-up in: [place]. You will receive a reminder letter in the mail two months in advance. If you don't receive a letter, please call our office to schedule the follow-up appointment.   If you need a refill on your cardiac medications before your next appointment, please call your pharmacy.

## 2016-02-18 NOTE — Progress Notes (Signed)
PCP: Annye Asa, MD  Clinic Note: Chief Complaint  Patient presents with  . Follow-up    pt has no cardiac complaints today   . Shortness of Breath    Chronic symptom. Has multiple cardiac risk factors.    HPI: April Combs is a 70 y.o. female with a PMH below who presents today for annual f/u.[name] She has had a full workup for her chronic dyspnea. She had a cardiac catheterization in November 2015 which showed no obstructive disease. She was finally referred to pulmonary medicine and was given a diagnosis of emphysema noted on chest x-ray. She is now being followed by Dr. Chase Caller from pulmonary medicine.  April Combs was last seen in Dec 2016 -- she was getting over a cold, was having to sleep on several pillows because of congestion. She was noticing shortness breath simply just rolling over. She still had exertional dyspnea with walking. Occasional palpitations and occasional lightheadedness or dizziness. No chest pain.  Recent Hospitalizations: none  Studies Reviewed: None  Interval History: April Combs presents today actually doing fairly well from a cardiac standpoint she says her breathing is definitely improved. Since I last saw her she's had maybe 2 or 3 episodes of tightness in her chest. Is worse when she lies down, or after significant coughing. Now she really only notes shortness of breath with exerting herself a bit. She can almost a flight of steps now without stopping, has definitely felt better.  She's had a couple episodes of her blood pressure lowering down to a range which made her feel quite dizzy. Maybe 2 in the last 6 months. She has off and on episodes of chest discomfort which are similar to what she had in the past but nothing overly concerning. Still has occasional palpitations, but not as much as she did before.  No PND, orthopnea or edema. She still sleeps with 2 pillows because for comfort. No syncope/near syncope or TIA/amaurosis fugax  symptoms.  She was restarted back on her statin as her lipids and gotten out of control. Have not been rechecked recently. No claudication.  ROS: A comprehensive was performed.  As noted in HPI.  Review of Systems  Constitutional: Negative for malaise/fatigue and weight loss.  HENT: Positive for congestion (But notably improved now).   Eyes: Negative.   Respiratory: Positive for cough and shortness of breath (Stable). Negative for wheezing.   Cardiovascular:       Per history of present illness  Gastrointestinal: Positive for nausea. Negative for blood in stool.  Genitourinary: Positive for flank pain. Negative for hematuria.  Musculoskeletal: Negative for myalgias.  Neurological: Positive for dizziness.  Endo/Heme/Allergies: Positive for environmental allergies.  Psychiatric/Behavioral: Negative.  Negative for depression and memory loss. The patient is not nervous/anxious and does not have insomnia.   All other systems reviewed and are negative.   Past Medical History:  Diagnosis Date  . Anemia   . Anxiety   . Coronary artery disease (CAD) excluded November 2015   Low Risk Myoview (false positive) with possible inferior-inferolateral ischemia, but with nonsustained VT --> CATH --> Angiographically Normal Coronary Arteries  . Depression   . GERD (gastroesophageal reflux disease)   . Hyperlipidemia   . Hyperplastic colon polyp   . Hypertension   . NICM (nonischemic cardiomyopathy) ([place])    Essentially resolved. Echocardiogram July 2013 showed normal EF greater than 55%. Important septal motion. Normal LV filling pressures. Mild MR and mild anterior MVP  . Obesity (BMI 30-39.9)   .  Osteoarthritis   . Positional vertigo     Past Surgical History:  Procedure Laterality Date  . ABDOMINAL HYSTERECTOMY    . APPENDECTOMY  as child  . CHOLECYSTECTOMY    . COLONOSCOPY    . DOPPLER ECHOCARDIOGRAPHY  July 2013   09/20/11. normal Lv thickness and function with EF greater thatn 55%  .  Exercise tolerance test - CPET-MET-TEST  July 2013   Submaximal effort. Only 80% of her, therefore peak VO2 of 75% is likely an underestimate. High resting heart rate, achieving 86% of heart rate. --> Unable to interpret due to lack of effort.  [name] GASTRIC BYPASS  1980  . GASTRIC BYPASS    . KIDNEY STONE SURGERY    . LEFT HEART CATHETERIZATION WITH CORONARY ANGIOGRAM N/A 02/01/2014   Procedure: LEFT HEART CATHETERIZATION WITH CORONARY ANGIOGRAM;  Surgeon: Leonie Man, MD;  Location: [facility] CATH LAB;  Service: Cardiovascular: Angiographically normal coronaries  . NM MYOVIEW LTD  November 2015   4:20 min, 4.6 METS --> DOE, but no chest discomfort; Significant + ST -T wave changes noted with NSVT & PVCs. Images suggest Inferior-inferolateral ischemia.  Low Risk. -- FALSE POSITIVE  . Pulmonary Function Tests  July 2013   Increased densities, decreased FVC - consistent with obesity hypoventilation; FEV1 is 58%, FVC 60% of predicted.  [name] UPPER GASTROINTESTINAL ENDOSCOPY     Current Meds  Medication Sig  . atorvastatin (LIPITOR) 20 MG tablet Take 1 tablet (20 mg total) by mouth daily.  . carvedilol (COREG) 12.5 MG tablet TAKE ONE TABLET BY MOUTH TWICE DAILY WITH MEALS  . lisinopril-hydrochlorothiazide (PRINZIDE,ZESTORETIC) 20-25 MG tablet Take 1 tablet by mouth daily.  . nitroGLYCERIN (NITROSTAT) 0.4 MG SL tablet Place 1 tablet (0.4 mg total) under the tongue every 5 (five) minutes as needed for chest pain.  . ranitidine (ZANTAC) 300 MG tablet TAKE ONE TABLET BY MOUTH AT BEDTIME  . traMADol (ULTRAM) 50 MG tablet Take 1 tablet (50 mg total) by mouth every 8 (eight) hours as needed.  . [DISCONTINUED] atorvastatin (LIPITOR) 20 MG tablet Take 1 tablet (20 mg total) by mouth daily.   -- just restarterd statin  No Known Allergies   Social History   Social History  . Marital status: Married    Spouse name: N/A  . Number of children: 3  . Years of education: N/A   Occupational History  . Retired     Social History Main Topics  . Smoking status: Former Smoker    Quit date: 01/18/2003  . Smokeless tobacco: Never Used  . Alcohol use 1.8 oz/week    3 Cans of beer per week     Comment: drinks 2-3 beers a week  . Drug use: No  . Sexual activity: Not Asked   Other Topics Concern  . None   Social History Narrative   She is married to [name], also patient of Dr. Ellyn Hack.   Mother of 3, grandmother 66.   Quit smoking in 2005. Social alcohol.   No routine exercise.   Family History  Problem Relation Age of Onset  . Kidney disease Daughter   . Multiple sclerosis Daughter   . Heart disease Father   . Heart disease Mother   . Heart disease Paternal Grandfather   . Colon cancer Other     early 40's.  Genetic testing from maternal side of family.  . Colon cancer Maternal Aunt   . Lung cancer Sister     smoker  . Colon  cancer Maternal Uncle   . Diabetes Daughter     x3  . Heart disease Maternal Aunt   . Colon polyps Neg Hx   . Esophageal cancer Neg Hx   . Gallbladder disease Neg Hx   . Rectal cancer Neg Hx   . Stomach cancer Neg Hx     Wt Readings from Last 3 Encounters:  02/18/16 106.4 kg (234 lb 9.6 oz)  10/15/15 103.6 kg (228 lb 6 oz)  04/29/15 104.3 kg (230 lb)    PHYSICAL EXAM BP 130/88 (BP Location: Right Arm, Patient Position: Sitting, Cuff Size: Large)   Pulse 97   Ht 5' 5"  (1.651 m)   Wt 106.4 kg (234 lb 9.6 oz)   BMI 39.04 kg/m  General appearance: alert, cooperative, appears stated age, no distress and morbidly obese - truncal obesity Neck: no adenopathy, no carotid bruit, no JVD and supple, symmetrical, trachea midline Lungs: CTAB, normal percussion bilaterally and Nonlabored, good movement Heart: normal apical impulse, RRR , S1&S2 normal, no S3 or S4 and 1/6 HSM at apex. No click. No R./G. Abdomen: soft, non-tender; bowel sounds normal; no masses, no organomegaly and Morbidly obese; unable to palpate organomegaly Extremities: edema Trace edema. No  clubbing or cyanosis. Mild venous stasis changes. Pulses: 2+ and symmetric Neurologic: Grossly normal    Adult ECG Report  Rate: 97;  Rhythm: normal sinus rhythm and premature ventricular contractions (PVC); stable Inf TWI/nonspecific ST and T-wave changes.. Normal Axis & intervals  Narrative Interpretation: Stable EKG -  Other studies Reviewed: Additional studies/ records that were reviewed today include:  Recent Labs:  Checked today Lab Results  Component Value Date   CHOL 186 02/18/2016   HDL 60 02/18/2016   LDLCALC 56 02/18/2016   LDLDIRECT 172.0 10/15/2015   TRIG 348 (H) 02/18/2016   CHOLHDL 3.1 02/18/2016   Lab Results  Component Value Date   CREATININE 0.77 02/18/2016   BUN 33 (H) 02/18/2016   NA 142 02/18/2016   K 4.7 02/18/2016   CL 105 02/18/2016   CO2 23 02/18/2016    ASSESSMENT / PLAN: Problem List Items Addressed This Visit    Essential hypertension - Primary (Chronic)    Chronic. Well-controlled on ACE inhibitor and beta blocker. Normal renal function.        Relevant Medications   atorvastatin (LIPITOR) 20 MG tablet   Other Relevant Orders   EKG 12-Lead   Comprehensive metabolic panel (Completed)   Lipid panel (Completed)   Hyperlipidemia with target LDL less than 100 (Chronic)    She had been on statin plus fenofibrate, and no longer on the fenofibrate. Apparently she is back on atorvastatin but has not had recent labs. Recheck them today and her LDL is measured to be 56.  Direct LDL is 172 -- at this point I think the next lipid panel probably should be an NMR panel or CardioIQ      Relevant Medications   atorvastatin (LIPITOR) 20 MG tablet   Chest pain on exertion (Chronic)    Evaluated in the past with no evidence of obstructive CAD. This correlates with a low risk Myoview stress test. Simply continue to treat cardiac risk factors as we cannot exclude microvascular ischemia.      Irregular heart beat (Chronic)    Stable on beta blocker. No  prolonged rhythm issues.      Relevant Orders   EKG 12-Lead   Comprehensive metabolic panel (Completed)   Lipid panel (Completed)   Dyspnea and respiratory  abnormality (Chronic)    Stable      Pulmonary emphysema (HCC) (Chronic)    She initially was happy to have seen pulmonary medicine, but is now reluctant to use her inhalers. Seems to be resigned to having dyspnea. Did not complain of this to me.      Obesity (Chronic)    The patient understands the need to lose weight with diet and exercise. We have discussed specific strategies for this.       Other Visit Diagnoses    Medication management       Relevant Orders   EKG 12-Lead   Comprehensive metabolic panel (Completed)   Lipid panel (Completed)      Current medicines are reviewed at length with the patient today. (+/- concerns) none The following changes have been made: none  Patient Instructions  Start taking LINSOPRIL -HCTZ at lunch time daily. No other changes with medications   Labs- do not eat or Drink the morning of the test---CMP ,LIPIDS    Your physician wants you to follow-up in: [place]. You will receive a reminder letter in the mail two months in advance. If you don't receive a letter, please call our office to schedule the follow-up appointment.   If you need a refill on your cardiac medications before your next appointment, please call your pharmacy.   Studies Ordered:   Orders Placed This Encounter  Procedures  . Comprehensive metabolic panel  . Lipid panel  . EKG 12-Lead     Glenetta Hew, M.D., M.S. Interventional Cardiologist   Pager # [phone]

## 2016-02-20 ENCOUNTER — Encounter: Payer: Self-pay | Admitting: Cardiology

## 2016-02-20 NOTE — Assessment & Plan Note (Signed)
She had been on statin plus fenofibrate, and no longer on the fenofibrate. Apparently she is back on atorvastatin but has not had recent labs. Recheck them today and her LDL is measured to be 56.  Direct LDL is 172 -- at this point I think the next lipid panel probably should be an NMR panel or CardioIQ

## 2016-02-20 NOTE — Assessment & Plan Note (Signed)
Chronic. Well-controlled on ACE inhibitor and beta blocker. Normal renal function.

## 2016-02-20 NOTE — Assessment & Plan Note (Signed)
The patient understands the need to lose weight with diet and exercise. We have discussed specific strategies for this.  

## 2016-02-20 NOTE — Assessment & Plan Note (Signed)
Stable

## 2016-02-20 NOTE — Assessment & Plan Note (Signed)
Stable on beta blocker. No prolonged rhythm issues.

## 2016-02-20 NOTE — Assessment & Plan Note (Signed)
Evaluated in the past with no evidence of obstructive CAD. This correlates with a low risk Myoview stress test. Simply continue to treat cardiac risk factors as we cannot exclude microvascular ischemia.

## 2016-02-20 NOTE — Assessment & Plan Note (Signed)
>>  ASSESSMENT AND PLAN FOR PULMONARY EMPHYSEMA (HCC) WRITTEN ON 02/20/2016  4:49 PM BY [place]ARD[place], ALM ORN, MD  She initially was happy to have seen pulmonary medicine, but is now reluctant to use her inhalers. Seems to be resigned to having dyspnea. Did not complain of this to me.

## 2016-02-20 NOTE — Assessment & Plan Note (Signed)
She initially was happy to have seen pulmonary medicine, but is now reluctant to use her inhalers. Seems to be resigned to having dyspnea. Did not complain of this to me.

## 2016-05-18 ENCOUNTER — Other Ambulatory Visit: Payer: Self-pay | Admitting: Family Medicine

## 2016-05-18 DIAGNOSIS — Z1231 Encounter for screening mammogram for malignant neoplasm of breast: Secondary | ICD-10-CM

## 2016-06-07 ENCOUNTER — Ambulatory Visit
Admission: RE | Admit: 2016-06-07 | Discharge: 2016-06-07 | Disposition: A | Discharge status: Home / Self Care | Payer: Medicare Other | Source: Ambulatory Visit | Attending: Family Medicine | Admitting: Family Medicine

## 2016-06-07 DIAGNOSIS — Z1231 Encounter for screening mammogram for malignant neoplasm of breast: Secondary | ICD-10-CM | POA: Diagnosis not present

## 2016-07-08 DIAGNOSIS — M7712 Lateral epicondylitis, left elbow: Secondary | ICD-10-CM | POA: Diagnosis not present

## 2016-09-08 ENCOUNTER — Encounter: Payer: Self-pay | Admitting: Family Medicine

## 2016-09-08 ENCOUNTER — Ambulatory Visit (INDEPENDENT_AMBULATORY_CARE_PROVIDER_SITE_OTHER): Payer: Medicare Other | Admitting: Family Medicine

## 2016-09-08 VITALS — BP 126/86 | HR 76 | Temp 98.2°F | Resp 17 | Ht 65.0 in | Wt 231.5 lb

## 2016-09-08 DIAGNOSIS — Z87891 Personal history of nicotine dependence: Secondary | ICD-10-CM | POA: Diagnosis not present

## 2016-09-08 DIAGNOSIS — M7711 Lateral epicondylitis, right elbow: Secondary | ICD-10-CM | POA: Diagnosis not present

## 2016-09-08 MED ORDER — PREDNISONE 10 MG PO TABS: ORAL_TABLET | ORAL | 0 refills | Status: DC

## 2016-09-08 NOTE — Progress Notes (Signed)
   Subjective:    Patient ID: April Combs, female    DOB: 09/26/1945, 71 y.o.   MRN: [id number]  HPI Elbow pain- R arm, pt reports sxs started ~1 month ago after helping grandson remove wallpaper.  Pt having pain over lateral epicondyle.  AM stiffness.  Painful and difficult to grip things w/ R hand.  Pt has hx of similar that responded well to prednisone.   Hx of tobacco use- pt is due for repeat lung cancer screen   Review of Systems For ROS see HPI     Objective:   Physical Exam  Constitutional: She is oriented to person, place, and time. She appears well-developed and well-nourished. No distress.  HENT:  Head: Normocephalic and atraumatic.  Cardiovascular: Intact distal pulses.   Musculoskeletal: She exhibits tenderness (TTP over R lateral epicondyl, no TTP over medial epicondyle). She exhibits no edema or deformity (no deformity of either elbow).  Neurological: She is alert and oriented to person, place, and time.  Skin: Skin is warm and dry. No rash noted. No erythema.  Psychiatric: She has a normal mood and affect. Her behavior is normal. Thought content normal.  Vitals reviewed.         Assessment & Plan:  R lateral epicondylitis- new.  Pt's sxs and PE consistent w/ inflammation.  Start Prednisone as NSAIDs pose GI and cardiac risk given her age.  Reviewed supportive care and red flags that should prompt return.  Pt expressed understanding and is in agreement w/ plan.   Hx of tobacco use- due for repeat lung cancer scan.  Referral placed.

## 2016-09-08 NOTE — Patient Instructions (Signed)
Schedule your complete physical for August (please schedule w/ husband if possible) Start the Prednisone as directed- take w/ food ICE the elbow We'll call you with your lung appt Call with any questions or concerns [place]!!!

## 2016-09-08 NOTE — Progress Notes (Signed)
Pre visit review using our clinic review tool, if applicable. No additional management support is needed unless otherwise documented below in the visit note. 

## 2016-09-14 ENCOUNTER — Other Ambulatory Visit: Payer: Self-pay | Admitting: Acute Care

## 2016-09-14 DIAGNOSIS — Z87891 Personal history of nicotine dependence: Secondary | ICD-10-CM

## 2016-09-22 ENCOUNTER — Ambulatory Visit (INDEPENDENT_AMBULATORY_CARE_PROVIDER_SITE_OTHER)
Admission: RE | Admit: 2016-09-22 | Discharge: 2016-09-22 | Disposition: A | Discharge status: Home / Self Care | Payer: Medicare Other | Source: Ambulatory Visit | Attending: Acute Care | Admitting: Acute Care

## 2016-09-22 ENCOUNTER — Ambulatory Visit (INDEPENDENT_AMBULATORY_CARE_PROVIDER_SITE_OTHER): Payer: Medicare Other | Admitting: Acute Care

## 2016-09-22 ENCOUNTER — Encounter: Payer: Self-pay | Admitting: Acute Care

## 2016-09-22 DIAGNOSIS — Z87891 Personal history of nicotine dependence: Secondary | ICD-10-CM | POA: Diagnosis not present

## 2016-09-22 NOTE — Progress Notes (Signed)
Shared Decision Making Visit Lung Cancer Screening Program [phone])   Eligibility:  Age 71 y.o.  Pack Years Smoking History Calculation 30-pack-year smoking history (# packs/per year x # years smoked)  Recent History of coughing up blood  no  Unexplained weight loss? no ( >Than 15 pounds within the last 6 months )  Prior History Lung / other cancer no (Diagnosis within the last 5 years already requiring surveillance chest CT Scans).  Smoking Status Former Smoker  Former Smokers: Years since quit: 9 years  Quit Date: 2009  Visit Components:  Discussion included one or more decision making aids. yes  Discussion included risk/benefits of screening. yes  Discussion included potential follow up diagnostic testing for abnormal scans. yes  Discussion included meaning and risk of over diagnosis. yes  Discussion included meaning and risk of False Positives. yes  Discussion included meaning of total radiation exposure. yes  Counseling Included:  Importance of adherence to annual lung cancer LDCT screening. yes  Impact of comorbidities on ability to participate in the program. yes  Ability and willingness to under diagnostic treatment. yes  Smoking Cessation Counseling:  Current Smokers:   Discussed importance of smoking cessation. yes  Information about tobacco cessation classes and interventions provided to patient. yes  Patient provided with "ticket" for LDCT Scan. yes  Symptomatic Patient. no  Counseling  Diagnosis Code: Tobacco Use Z72.0  Asymptomatic Patient yes  Counseling (Intermediate counseling: > three minutes counseling) Z6606  Former Smokers:   Discussed the importance of maintaining cigarette abstinence. yes  Diagnosis Code: Personal History of Nicotine Dependence. T01.601  Information about tobacco cessation classes and interventions provided to patient. Yes  Patient provided with "ticket" for LDCT Scan. yes  Written Order for Lung Cancer  Screening with LDCT placed in Epic. Yes (CT Chest Lung Cancer Screening Low Dose W/O CM) [id number] Z12.2-Screening of respiratory organs Z87.891-Personal history of nicotine dependence  I spent 25 minutes of face to face time with April Combs discussing the risks and benefits of lung cancer screening. We viewed a power point together that explained in detail the above noted topics. We took the time to pause the power point at intervals to allow for questions to be asked and answered to ensure understanding. We discussed that she had taken the single most powerful action possible to decrease her risk of developing lung cancer when she quit smoking. I counseled April Combs to remain smoke free, and to contact me if she ever had the desire to smoke again so that I can provide resources and tools to help support the effort to remain smoke free. We discussed the time and location of the scan, and that either  Doroteo Glassman RN or I will call with the results within  24-48 hours of receiving them. April Combs has my card and contact information in the event she needs to speak with me, in addition to a copy of the power point we reviewed as a resource. April Combs verbalized understanding of all of the above and had no further questions upon leaving the office.     I explained to the patient that there has been a high incidence of coronary artery disease noted on these exams. I explained that this is a non-gated exam therefore degree or severity cannot be determined. This patient is currently on statin therapy per her PCP and her cardiologist Dr. Johny Chess. I have asked the patient to follow-up with their PCP regarding any incidental finding of coronary artery disease and management  with diet or medication as they feel is clinically indicated. The patient verbalized understanding of the above and had no further questions.     Magdalen Spatz, NP 09/22/2016

## 2016-09-28 ENCOUNTER — Other Ambulatory Visit: Payer: Self-pay | Admitting: Acute Care

## 2016-09-28 DIAGNOSIS — Z87891 Personal history of nicotine dependence: Secondary | ICD-10-CM

## 2016-11-23 ENCOUNTER — Other Ambulatory Visit: Payer: Self-pay | Admitting: Family Medicine

## 2016-11-23 DIAGNOSIS — I10 Essential (primary) hypertension: Secondary | ICD-10-CM

## 2016-12-06 ENCOUNTER — Telehealth: Payer: Self-pay | Admitting: Internal Medicine

## 2016-12-06 NOTE — Telephone Encounter (Signed)
Ok, I will accept her

## 2016-12-06 NOTE — Telephone Encounter (Signed)
Pt daughter called asking if [name] will except as a transfer, April Combs the daughter is a patient of [facility].  Please advise on transfer

## 2016-12-06 NOTE — Telephone Encounter (Signed)
Ok to transfer.  Please give pt my best!

## 2016-12-07 NOTE — Telephone Encounter (Signed)
Appt made for 10/1 with April Combs

## 2016-12-12 NOTE — Progress Notes (Signed)
Subjective:    Patient ID: April Combs, female    DOB: 01-17-1946, 71 y.o.   MRN: [id number]  HPI  She is here to establish with a new pcp.  The patient is here for follow up.  Hypertension: She is taking her medication daily. She is compliant with a low sodium diet.  She does have daily SOB and some chest tightness with exertion.  She follows with cardiology and they are aware.  She denies chest pain and regular headaches. She is not exercising regularly.      Hyperlipidemia: She is taking her medication daily. She is compliant with a low fat/cholesterol diet. She is not exercising regularly. She denies myalgias.   GERD:  She is taking her medication daily as prescribed.  She denies any GERD symptoms and feels her GERD is well controlled.   COPD:  She has daily SOB, cough and wheeze.  These symptoms do limit her daily activities.  She does not want to be on an inhaler until she really needs one.  If she sits when symptomatic the symptoms pass.    Medications and allergies reviewed with patient and updated if appropriate.  Patient Active Problem List   Diagnosis Date Noted  . Obesity 10/15/2015  . Dyspnea and respiratory abnormality 04/29/2015  . Pulmonary emphysema ([place]) 04/29/2015  . BRBPR (bright red blood per rectum) 02/05/2015  . Lumbar radiculopathy 12/24/2014  . Hx of tobacco use, presenting hazards to health 09/29/2014  . Gastroenteritis 03/11/2014  . Nail fungus 03/11/2014  . FALSE POSITIVE NUCLEAR STRESS CHEST 01/28/2014  . Chest pain on exertion 01/14/2014  . GERD (gastroesophageal reflux disease) 05/15/2012  . Positional vertigo 12/22/2011  . Irregular heart beat 09/29/2011  . Fungal dermatitis 09/29/2011  . Hyperlipidemia with target LDL less than 100 09/29/2011  . SOB (shortness of breath) 08/23/2011  . HYPERTRIGLYCERIDEMIA 07/12/2008  . DEPRESSION 05/28/2008  . Essential hypertension 05/28/2008  . OSTEOARTHRITIS 05/28/2008    Current Outpatient  Prescriptions on File Prior to Visit  Medication Sig Dispense Refill  . atorvastatin (LIPITOR) 20 MG tablet Take 1 tablet (20 mg total) by mouth daily. 90 tablet 3  . carvedilol (COREG) 12.5 MG tablet TAKE ONE TABLET BY MOUTH TWICE DAILY WITH MEALS 180 tablet 3  . lisinopril-hydrochlorothiazide (PRINZIDE,ZESTORETIC) 20-25 MG tablet TAKE ONE TABLET BY MOUTH ONCE DAILY 90 tablet 1  . ranitidine (ZANTAC) 300 MG tablet TAKE ONE TABLET BY MOUTH AT BEDTIME 90 tablet 3   No current facility-administered medications on file prior to visit.     Past Medical History:  Diagnosis Date  . Anemia   . Anxiety   . Coronary artery disease (CAD) excluded November 2015   Low Risk Myoview (false positive) with possible inferior-inferolateral ischemia, but with nonsustained VT --> CATH --> Angiographically Normal Coronary Arteries  . Depression   . GERD (gastroesophageal reflux disease)   . Hyperlipidemia   . Hyperplastic colon polyp   . Hypertension   . NICM (nonischemic cardiomyopathy) ([place])    Essentially resolved. Echocardiogram July 2013 showed normal EF greater than 55%. Important septal motion. Normal LV filling pressures. Mild MR and mild anterior MVP  . Obesity (BMI 30-39.9)   . Osteoarthritis   . Positional vertigo     Past Surgical History:  Procedure Laterality Date  . ABDOMINAL HYSTERECTOMY    . APPENDECTOMY  as child  . BREAST BIOPSY Right    benign  . CHOLECYSTECTOMY    . COLONOSCOPY    .  DOPPLER ECHOCARDIOGRAPHY  July 2013   09/20/11. normal Lv thickness and function with EF greater thatn 55%  . Exercise tolerance test - CPET-MET-TEST  July 2013   Submaximal effort. Only 80% of her, therefore peak VO2 of 75% is likely an underestimate. High resting heart rate, achieving 86% of heart rate. --> Unable to interpret due to lack of effort.  [name] GASTRIC BYPASS  1980  . GASTRIC BYPASS    . KIDNEY STONE SURGERY    . LEFT HEART CATHETERIZATION WITH CORONARY ANGIOGRAM N/A 02/01/2014    Procedure: LEFT HEART CATHETERIZATION WITH CORONARY ANGIOGRAM;  Surgeon: Leonie Man, MD;  Location: [facility] CATH LAB;  Service: Cardiovascular: Angiographically normal coronaries  . NM MYOVIEW LTD  November 2015   4:20 min, 4.6 METS --> DOE, but no chest discomfort; Significant + ST -T wave changes noted with NSVT & PVCs. Images suggest Inferior-inferolateral ischemia.  Low Risk. -- FALSE POSITIVE  . Pulmonary Function Tests  July 2013   Increased densities, decreased FVC - consistent with obesity hypoventilation; FEV1 is 58%, FVC 60% of predicted.  [name] UPPER GASTROINTESTINAL ENDOSCOPY      Social History   Social History  . Marital status: Married    Spouse name: N/A  . Number of children: 3  . Years of education: N/A   Occupational History  . Retired    Social History Main Topics  . Smoking status: Former Smoker    Packs/day: 1.00    Years: 30.00    Quit date: 01/18/2003  . Smokeless tobacco: Never Used  . Alcohol use 1.8 oz/week    3 Cans of beer per week     Comment: drinks 2-3 beers a week  . Drug use: No  . Sexual activity: Not Asked   Other Topics Concern  . None   Social History Narrative   She is married to [name], also patient of Dr. Ellyn Hack.   Mother of 3, grandmother 32.   Quit smoking in 2005. Social alcohol.   No routine exercise.    Family History  Problem Relation Age of Onset  . Kidney disease Daughter   . Multiple sclerosis Daughter   . Heart disease Father   . Heart disease Mother   . Heart disease Paternal Grandfather   . Colon cancer Other        early 6's.  Genetic testing from maternal side of family.  . Colon cancer Maternal Aunt   . Breast cancer Maternal Aunt   . Lung cancer Sister        smoker  . Colon cancer Maternal Uncle   . Diabetes Daughter        x3  . Heart disease Maternal Aunt   . Colon polyps Neg Hx   . Esophageal cancer Neg Hx   . Gallbladder disease Neg Hx   . Rectal cancer Neg Hx   . Stomach cancer Neg Hx      Review of Systems  Constitutional: Negative for chills and fever.  Respiratory: Positive for cough, chest tightness (sometimes with walking), shortness of breath and wheezing.   Cardiovascular: Positive for palpitations and leg swelling (rare). Negative for chest pain (tightness with walking - has discussed with cardiology).  Gastrointestinal: Negative for abdominal pain.  Neurological: Positive for headaches (occ with sinses act up). Negative for light-headedness.       Objective:   Vitals:   12/13/16 0913  BP: 128/76  Pulse: 77  Resp: 18  Temp: 97.8 F (36.6 C)  SpO2: 93%   Wt Readings from Last 3 Encounters:  12/13/16 235 lb (106.6 kg)  09/08/16 231 lb 8 oz (105 kg)  02/18/16 234 lb 9.6 oz (106.4 kg)   Body mass index is 39.11 kg/m.   Physical Exam    Constitutional: Appears well-developed and well-nourished. No distress.  HENT:  Head: Normocephalic and atraumatic.  Neck: Neck supple. No tracheal deviation present. No thyromegaly present.  No cervical lymphadenopathy Cardiovascular: Normal rate, regular rhythm and normal heart sounds.   No murmur heard. No carotid bruit .  No edema Pulmonary/Chest: Effort normal and breath sounds normal. No respiratory distress. No has no wheezes. No rales.  Skin: Skin is warm and dry. Not diaphoretic.  Psychiatric: Normal mood and affect. Behavior is normal.      Assessment & Plan:    See Problem List for Assessment and Plan of chronic medical problems.

## 2016-12-13 ENCOUNTER — Encounter: Payer: Self-pay | Admitting: Internal Medicine

## 2016-12-13 ENCOUNTER — Other Ambulatory Visit (INDEPENDENT_AMBULATORY_CARE_PROVIDER_SITE_OTHER): Payer: Medicare Other

## 2016-12-13 ENCOUNTER — Ambulatory Visit (INDEPENDENT_AMBULATORY_CARE_PROVIDER_SITE_OTHER): Payer: Medicare Other | Admitting: Internal Medicine

## 2016-12-13 VITALS — BP 128/76 | HR 77 | Temp 97.8°F | Resp 18 | Ht 65.0 in | Wt 235.0 lb

## 2016-12-13 DIAGNOSIS — J432 Centrilobular emphysema: Secondary | ICD-10-CM | POA: Diagnosis not present

## 2016-12-13 DIAGNOSIS — I10 Essential (primary) hypertension: Secondary | ICD-10-CM

## 2016-12-13 DIAGNOSIS — K219 Gastro-esophageal reflux disease without esophagitis: Secondary | ICD-10-CM | POA: Diagnosis not present

## 2016-12-13 DIAGNOSIS — Z23 Encounter for immunization: Secondary | ICD-10-CM | POA: Diagnosis not present

## 2016-12-13 DIAGNOSIS — E785 Hyperlipidemia, unspecified: Secondary | ICD-10-CM | POA: Diagnosis not present

## 2016-12-13 LAB — COMPREHENSIVE METABOLIC PANEL
ALT: 13 U/L (ref 0–35)
AST: 17 U/L (ref 0–37)
Albumin: 4.2 g/dL (ref 3.5–5.2)
Alkaline Phosphatase: 56 U/L (ref 39–117)
BILIRUBIN TOTAL: 0.4 mg/dL (ref 0.2–1.2)
BUN: 27 mg/dL — AB (ref 6–23)
CO2: 27 meq/L (ref 19–32)
Calcium: 9.7 mg/dL (ref 8.4–10.5)
Chloride: 103 mEq/L (ref 96–112)
Creatinine, Ser: 0.83 mg/dL (ref 0.40–1.20)
GFR: 72.17 mL/min (ref >60.00)
GLUCOSE: 105 mg/dL — AB (ref 70–99)
Potassium: 4.1 mEq/L (ref 3.5–5.1)
SODIUM: 143 meq/L (ref 135–145)
Total Protein: 6.9 g/dL (ref 6.0–8.3)

## 2016-12-13 LAB — LIPID PANEL
Cholesterol: 190 mg/dL (ref 0–200)
HDL: 53.2 mg/dL (ref >39.00)
NONHDL: 137.04
Total CHOL/HDL Ratio: 4
Triglycerides: 237 mg/dL — ABNORMAL HIGH (ref 0.0–149.0)
VLDL: 47.4 mg/dL — ABNORMAL HIGH (ref 0.0–40.0)

## 2016-12-13 LAB — CBC WITH DIFFERENTIAL/PLATELET
BASOS ABS: 0.1 10*3/uL (ref 0.0–0.1)
Basophils Relative: 0.9 % (ref 0.0–3.0)
EOS ABS: 0.4 10*3/uL (ref 0.0–0.7)
Eosinophils Relative: 6.7 % — ABNORMAL HIGH (ref 0.0–5.0)
HCT: 40.8 % (ref 36.0–46.0)
Hemoglobin: 14 g/dL (ref 12.0–15.0)
LYMPHS ABS: 1.9 10*3/uL (ref 0.7–4.0)
Lymphocytes Relative: 34.9 % (ref 12.0–46.0)
MCHC: 34.2 g/dL (ref 30.0–36.0)
MCV: 94.3 fl (ref 78.0–100.0)
MONO ABS: 0.5 10*3/uL (ref 0.1–1.0)
Monocytes Relative: 9.4 % (ref 3.0–12.0)
NEUTROS ABS: 2.7 10*3/uL (ref 1.4–7.7)
NEUTROS PCT: 48.1 % (ref 43.0–77.0)
PLATELETS: 186 10*3/uL (ref 150.0–400.0)
RBC: 4.33 Mil/uL (ref 3.87–5.11)
RDW: 13.4 % (ref 11.5–15.5)
WBC: 5.6 10*3/uL (ref 4.0–10.5)

## 2016-12-13 LAB — LDL CHOLESTEROL, DIRECT: LDL DIRECT: 93 mg/dL

## 2016-12-13 LAB — TSH: TSH: 4.91 u[IU]/mL — AB (ref 0.35–4.50)

## 2016-12-13 NOTE — Assessment & Plan Note (Signed)
>>  ASSESSMENT AND PLAN FOR PULMONARY EMPHYSEMA ([facility]) WRITTEN ON 12/13/2016  9:46 AM BY BURNS, STACY J, MD  Has daily symptoms of cough, wheeze and SOB that limits her ADL's. She has seen pulm in the past, but is not seeing them now She does not want to use any inhalers until she absolutely has to - concern with taking medications and possible cost Will monitor w/o medication for now

## 2016-12-13 NOTE — Assessment & Plan Note (Signed)
BP well controlled Current regimen effective and well tolerated Continue current medications at current doses Cmp, cbc, tsh 

## 2016-12-13 NOTE — Assessment & Plan Note (Signed)
Cholesterol has been well controlled in past Check lipid panel  Continue daily statin

## 2016-12-13 NOTE — Assessment & Plan Note (Signed)
Has daily symptoms of cough, wheeze and SOB that limits her ADL's. She has seen pulm in the past, but is not seeing them now She does not want to use any inhalers until she absolutely has to - concern with taking medications and possible cost Will monitor w/o medication for now

## 2016-12-13 NOTE — Patient Instructions (Addendum)
  Test(s) ordered today. Your results will be released to [place] (or called to you) after review, usually within 72hours after test completion. If any changes need to be made, you will be notified at that same time.  All other Health Maintenance issues reviewed.   All recommended immunizations and age-appropriate screenings are up-to-date or discussed.  Flu immunization administered today.   Medications reviewed and updated.  No changes recommended at this time.   Please followup in november

## 2016-12-13 NOTE — Assessment & Plan Note (Signed)
GERD controlled Continue daily medication  

## 2016-12-15 ENCOUNTER — Encounter: Payer: Self-pay | Admitting: Internal Medicine

## 2016-12-15 ENCOUNTER — Encounter: Payer: Medicare Other | Admitting: Family Medicine

## 2016-12-22 DIAGNOSIS — L578 Other skin changes due to chronic exposure to nonionizing radiation: Secondary | ICD-10-CM | POA: Diagnosis not present

## 2016-12-22 DIAGNOSIS — L814 Other melanin hyperpigmentation: Secondary | ICD-10-CM | POA: Diagnosis not present

## 2016-12-22 DIAGNOSIS — L570 Actinic keratosis: Secondary | ICD-10-CM | POA: Diagnosis not present

## 2017-01-18 ENCOUNTER — Ambulatory Visit: Payer: Medicare Other | Admitting: Internal Medicine

## 2017-02-09 ENCOUNTER — Other Ambulatory Visit (INDEPENDENT_AMBULATORY_CARE_PROVIDER_SITE_OTHER): Payer: Medicare Other

## 2017-02-09 ENCOUNTER — Ambulatory Visit (INDEPENDENT_AMBULATORY_CARE_PROVIDER_SITE_OTHER): Payer: Medicare Other | Admitting: Internal Medicine

## 2017-02-09 ENCOUNTER — Encounter: Payer: Self-pay | Admitting: Internal Medicine

## 2017-02-09 VITALS — BP 124/72 | HR 94 | Temp 97.7°F | Resp 16 | Wt 238.0 lb

## 2017-02-09 DIAGNOSIS — J432 Centrilobular emphysema: Secondary | ICD-10-CM

## 2017-02-09 DIAGNOSIS — E039 Hypothyroidism, unspecified: Secondary | ICD-10-CM

## 2017-02-09 DIAGNOSIS — Z1159 Encounter for screening for other viral diseases: Secondary | ICD-10-CM

## 2017-02-09 DIAGNOSIS — R7989 Other specified abnormal findings of blood chemistry: Secondary | ICD-10-CM

## 2017-02-09 DIAGNOSIS — I10 Essential (primary) hypertension: Secondary | ICD-10-CM

## 2017-02-09 HISTORY — DX: Hypothyroidism, unspecified: E03.9

## 2017-02-09 LAB — TSH: TSH: 3.95 u[IU]/mL (ref 0.35–4.50)

## 2017-02-09 MED ORDER — ATORVASTATIN CALCIUM 20 MG PO TABS: 20.0000 mg | ORAL_TABLET | Freq: Every day | ORAL | 3 refills | Status: DC

## 2017-02-09 MED ORDER — LISINOPRIL-HYDROCHLOROTHIAZIDE 20-25 MG PO TABS: 1.0000 | ORAL_TABLET | Freq: Every day | ORAL | 1 refills | Status: DC

## 2017-02-09 MED ORDER — RANITIDINE HCL 300 MG PO TABS: 300.0000 mg | ORAL_TABLET | Freq: Every day | ORAL | 3 refills | Status: DC

## 2017-02-09 MED ORDER — CARVEDILOL 12.5 MG PO TABS: 12.5000 mg | ORAL_TABLET | Freq: Two times a day (BID) | ORAL | 3 refills | Status: DC

## 2017-02-09 NOTE — Assessment & Plan Note (Signed)
BP well controlled Current regimen effective and well tolerated Continue current medications at current doses  

## 2017-02-09 NOTE — Assessment & Plan Note (Signed)
Minimally elevated tsh Will recheck  If still elevated we will start medication

## 2017-02-09 NOTE — Assessment & Plan Note (Signed)
DOE - unchanged - activity very limited, which is likely contributing to her weight gain Declined inhaler - she will let me know if she changes her mind

## 2017-02-09 NOTE — Patient Instructions (Signed)

## 2017-02-09 NOTE — Progress Notes (Signed)
Subjective:    Patient ID: April Combs, female    DOB: 05-16-1945, 71 y.o.   MRN: [id number]  HPI The patient is here for follow up.    Hypertension: She is taking her medication daily. She is compliant with a low sodium diet.  She denies chest pain, edema and regular headaches. She is not exercising regularly.  She does not monitor her blood pressure at home.    COPD:  She has shortness of breath with exertion, which is unchanged.  She is a mild cough, but denies any wheezing or fevers.  She does not use an inhaler and at this point does not want to use 1.  She is not able to exercise regularly.  Elevated TSH: With her most recent blood work she did have an elevated TSH.  She does state that she does not eat much and has gained weight and does not understand why.  She does have a family history of thyroid disease.  She has never been diagnosed with a thyroid disorder or been on medication.  Medications and allergies reviewed with patient and updated if appropriate.  Patient Active Problem List   Diagnosis Date Noted  . Obesity 10/15/2015  . Dyspnea and respiratory abnormality 04/29/2015  . Pulmonary emphysema ([place]) 04/29/2015  . BRBPR (bright red blood per rectum) 02/05/2015  . Lumbar radiculopathy 12/24/2014  . Hx of tobacco use, presenting hazards to health 09/29/2014  . Nail fungus 03/11/2014  . FALSE POSITIVE NUCLEAR STRESS CHEST 01/28/2014  . Chest pain on exertion 01/14/2014  . GERD (gastroesophageal reflux disease) 05/15/2012  . Positional vertigo 12/22/2011  . Irregular heart beat 09/29/2011  . Fungal dermatitis 09/29/2011  . Hyperlipidemia with target LDL less than 100 09/29/2011  . SOB (shortness of breath) 08/23/2011  . HYPERTRIGLYCERIDEMIA 07/12/2008  . DEPRESSION 05/28/2008  . Essential hypertension 05/28/2008  . OSTEOARTHRITIS 05/28/2008    Current Outpatient Medications on File Prior to Visit  Medication Sig Dispense Refill  . atorvastatin (LIPITOR) 20  MG tablet Take 1 tablet (20 mg total) by mouth daily. 90 tablet 3  . carvedilol (COREG) 12.5 MG tablet TAKE ONE TABLET BY MOUTH TWICE DAILY WITH MEALS 180 tablet 3  . lisinopril-hydrochlorothiazide (PRINZIDE,ZESTORETIC) 20-25 MG tablet TAKE ONE TABLET BY MOUTH ONCE DAILY 90 tablet 1  . ranitidine (ZANTAC) 300 MG tablet TAKE ONE TABLET BY MOUTH AT BEDTIME 90 tablet 3   No current facility-administered medications on file prior to visit.     Past Medical History:  Diagnosis Date  . Anemia   . Anxiety   . Coronary artery disease (CAD) excluded November 2015   Low Risk Myoview (false positive) with possible inferior-inferolateral ischemia, but with nonsustained VT --> CATH --> Angiographically Normal Coronary Arteries  . Depression   . GERD (gastroesophageal reflux disease)   . Hyperlipidemia   . Hyperplastic colon polyp   . Hypertension   . NICM (nonischemic cardiomyopathy) ([place])    Essentially resolved. Echocardiogram July 2013 showed normal EF greater than 55%. Important septal motion. Normal LV filling pressures. Mild MR and mild anterior MVP  . Obesity (BMI 30-39.9)   . Osteoarthritis   . Positional vertigo     Past Surgical History:  Procedure Laterality Date  . ABDOMINAL HYSTERECTOMY    . APPENDECTOMY  as child  . BREAST BIOPSY Right    benign  . CHOLECYSTECTOMY    . COLONOSCOPY    . DOPPLER ECHOCARDIOGRAPHY  July 2013   09/20/11. normal Lv thickness  and function with EF greater thatn 55%  . Exercise tolerance test - CPET-MET-TEST  July 2013   Submaximal effort. Only 80% of her, therefore peak VO2 of 75% is likely an underestimate. High resting heart rate, achieving 86% of heart rate. --> Unable to interpret due to lack of effort.  [name] GASTRIC BYPASS  1980  . GASTRIC BYPASS    . KIDNEY STONE SURGERY    . LEFT HEART CATHETERIZATION WITH CORONARY ANGIOGRAM N/A 02/01/2014   Procedure: LEFT HEART CATHETERIZATION WITH CORONARY ANGIOGRAM;  Surgeon: Leonie Man, MD;  Location:  [facility] CATH LAB;  Service: Cardiovascular: Angiographically normal coronaries  . NM MYOVIEW LTD  November 2015   4:20 min, 4.6 METS --> DOE, but no chest discomfort; Significant + ST -T wave changes noted with NSVT & PVCs. Images suggest Inferior-inferolateral ischemia.  Low Risk. -- FALSE POSITIVE  . Pulmonary Function Tests  July 2013   Increased densities, decreased FVC - consistent with obesity hypoventilation; FEV1 is 58%, FVC 60% of predicted.  [name] UPPER GASTROINTESTINAL ENDOSCOPY      Social History   Socioeconomic History  . Marital status: Married    Spouse name: None  . Number of children: 3  . Years of education: None  . Highest education level: None  Social Needs  . Financial resource strain: None  . Food insecurity - worry: None  . Food insecurity - inability: None  . Transportation needs - medical: None  . Transportation needs - non-medical: None  Occupational History  . Occupation: Retired  Tobacco Use  . Smoking status: Former Smoker    Packs/day: 1.00    Years: 30.00    Pack years: 30.00    Last attempt to quit: 01/18/2003    Years since quitting: 14.0  . Smokeless tobacco: Never Used  Substance and Sexual Activity  . Alcohol use: Yes    Alcohol/week: 1.8 oz    Types: 3 Cans of beer per week    Comment: drinks 2-3 beers a week  . Drug use: No  . Sexual activity: None  Other Topics Concern  . None  Social History Narrative   She is married to [name], also patient of Dr. Ellyn Hack.   Mother of 3, grandmother 62.   Quit smoking in 2005. Social alcohol.   No routine exercise.    Family History  Problem Relation Age of Onset  . Kidney disease Daughter   . Multiple sclerosis Daughter   . Heart disease Father   . Heart disease Mother   . Heart disease Paternal Grandfather   . Colon cancer Other        early 56's.  Genetic testing from maternal side of family.  . Colon cancer Maternal Aunt   . Breast cancer Maternal Aunt   . Lung cancer Sister         smoker  . Colon cancer Maternal Uncle   . Diabetes Daughter        x3  . Heart disease Maternal Aunt   . Colon polyps Neg Hx   . Esophageal cancer Neg Hx   . Gallbladder disease Neg Hx   . Rectal cancer Neg Hx   . Stomach cancer Neg Hx     Review of Systems  Constitutional: Negative for chills and fever.  Respiratory: Positive for cough and shortness of breath (with exertion). Negative for wheezing.   Cardiovascular: Positive for palpitations (intermittent - chronic). Negative for chest pain and leg swelling.  Neurological: Negative for light-headedness  and headaches.       Objective:   Vitals:   02/09/17 1335  BP: 124/72  Pulse: 94  Resp: 16  Temp: 97.7 F (36.5 C)  SpO2: 95%   Wt Readings from Last 3 Encounters:  02/09/17 238 lb (108 kg)  12/13/16 235 lb (106.6 kg)  09/08/16 231 lb 8 oz (105 kg)   Body mass index is 39.61 kg/m.   Physical Exam    Constitutional: Appears well-developed and well-nourished. No distress.  HENT:  Head: Normocephalic and atraumatic.  Neck: Neck supple. No tracheal deviation present. No thyromegaly present.  No cervical lymphadenopathy Cardiovascular: Normal rate, regular rhythm and normal heart sounds.   No murmur heard. No carotid bruit .  No edema Pulmonary/Chest: Effort normal and breath sounds normal. No respiratory distress. No has no wheezes. No rales.  Skin: Skin is warm and dry. Not diaphoretic.  Psychiatric: Normal mood and affect. Behavior is normal.      Assessment & Plan:    See Problem List for Assessment and Plan of chronic medical problems.    FU in 6 months

## 2017-02-09 NOTE — Assessment & Plan Note (Signed)
>>  ASSESSMENT AND PLAN FOR PULMONARY EMPHYSEMA (HCC) WRITTEN ON 02/09/2017  2:02 PM BY BURNS, GLADE PARAS, MD  DOE - unchanged - activity very limited, which is likely contributing to her weight gain Declined inhaler - she will let me know if she changes her mind

## 2017-02-10 ENCOUNTER — Encounter: Payer: Self-pay | Admitting: Internal Medicine

## 2017-02-10 LAB — HEPATITIS C ANTIBODY
HEP C AB: NONREACTIVE
SIGNAL TO CUT-OFF: 0.09 (ref <1.00)

## 2017-02-17 ENCOUNTER — Other Ambulatory Visit: Payer: Self-pay

## 2017-02-18 ENCOUNTER — Ambulatory Visit (INDEPENDENT_AMBULATORY_CARE_PROVIDER_SITE_OTHER): Payer: Medicare Other | Admitting: Cardiology

## 2017-02-18 ENCOUNTER — Encounter: Payer: Self-pay | Admitting: Cardiology

## 2017-02-18 VITALS — BP 134/82 | HR 82 | Ht 65.0 in | Wt 236.4 lb

## 2017-02-18 DIAGNOSIS — I10 Essential (primary) hypertension: Secondary | ICD-10-CM

## 2017-02-18 DIAGNOSIS — R0602 Shortness of breath: Secondary | ICD-10-CM | POA: Diagnosis not present

## 2017-02-18 DIAGNOSIS — I499 Cardiac arrhythmia, unspecified: Secondary | ICD-10-CM | POA: Diagnosis not present

## 2017-02-18 DIAGNOSIS — E785 Hyperlipidemia, unspecified: Secondary | ICD-10-CM | POA: Diagnosis not present

## 2017-02-18 DIAGNOSIS — E781 Pure hyperglyceridemia: Secondary | ICD-10-CM

## 2017-02-18 DIAGNOSIS — J439 Emphysema, unspecified: Secondary | ICD-10-CM | POA: Diagnosis not present

## 2017-02-18 MED ORDER — LISINOPRIL-HYDROCHLOROTHIAZIDE 20-25 MG PO TABS: 0.5000 | ORAL_TABLET | Freq: Every day | ORAL | 1 refills | Status: DC

## 2017-02-18 NOTE — Patient Instructions (Addendum)
MEDICATION INSTRUCTIONS  --- DECREASE LISINOPRIL /HCTZ 20/25 TO 1/2 TABLET  A DAY . IF BLOOD PRESSURE INCREASE MAY TAKE WHOLE TABLET FOR A FEW DAYS THEN RETURN TO 1/2 TABLET    Your physician wants you to follow-up in 53 MONTHS WITH DR HARDING. You will receive a reminder letter in the mail two months in advance. If you don't receive a letter, please call our office to schedule the follow-up appointment.  If you need a refill on your cardiac medications before your next appointment, please call your pharmacy.

## 2017-02-18 NOTE — Progress Notes (Signed)
PCP: Binnie Rail, MD  Clinic Note: Chief Complaint  Patient presents with  . Follow-up    Shortness of breath, nonobstructive CAD    HPI: April Combs is a 71 y.o. female with a PMH below who presents today for annual f/u for dyspnea.  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 Has been referred to pulmonary medicine and given diagnosis of "emphysema" based on chest x-ray.  Followed by Dr. Chase Caller.  April Combs was last seen in Dec 2017 -- doing fairly well from a cardiac standpoint she says her breathing is definitely improved. Since I last saw her she's had maybe 2 or 3 episodes of tightness in her chest. Is worse when she lies down, or after significant coughing. Now she really only notes shortness of breath with exerting herself a bit. S Recent Hospitalizations: none  Studies Reviewed: None  Interval History: April Combs presents today overall doing pretty well.  She still has her baseline dyspnea, and does not really do much activity.  Every now and then she will note some discomfort in her chest but is not necessarily associated with any rest or activity.  She may sit up at night to catch her breath, denies any PND or orthopnea.  Mild enlargement but nothing significant.  Occasional flip flopping skipping beats, no rapid irregular heartbeats or palpitations. She denies any syncope or near syncope, TIA or amaurosis fugax symptoms.  No claudications.   ROS: A comprehensive was performed.  As noted in HPI.  Review of Systems  Constitutional: Negative for malaise/fatigue and weight loss.  HENT: Negative for congestion.   Eyes: Negative.   Respiratory: Positive for shortness of breath (Stable). Negative for cough (Occasional) and wheezing.   Cardiovascular:       Per history of present illness  Gastrointestinal: Negative for blood in stool and nausea.  Genitourinary: Negative for flank pain and hematuria.  Musculoskeletal: Negative for myalgias.   Neurological: Positive for dizziness (Standing up too quickly).  Endo/Heme/Allergies: Positive for environmental allergies.  Psychiatric/Behavioral: Negative.  Negative for depression and memory loss. The patient is not nervous/anxious and does not have insomnia.   All other systems reviewed and are negative.   Past Medical History:  Diagnosis Date  . Anemia   . Anxiety   . Coronary artery disease (CAD) excluded November 2015   Low Risk Myoview (false positive) with possible inferior-inferolateral ischemia, but with nonsustained VT --> CATH --> Angiographically Normal Coronary Arteries  . Depression   . GERD (gastroesophageal reflux disease)   . Hyperlipidemia   . Hyperplastic colon polyp   . Hypertension   . NICM (nonischemic cardiomyopathy) ([place])    Essentially resolved. Echocardiogram July 2013 showed normal EF greater than 55%. Important septal motion. Normal LV filling pressures. Mild MR and mild anterior MVP  . Obesity (BMI 30-39.9)   . Osteoarthritis   . Positional vertigo     Past Surgical History:  Procedure Laterality Date  . ABDOMINAL HYSTERECTOMY    . APPENDECTOMY  as child  . BREAST BIOPSY Right    benign  . CHOLECYSTECTOMY    . COLONOSCOPY    . DOPPLER ECHOCARDIOGRAPHY  July 2013   09/20/11. normal Lv thickness and function with EF greater thatn 55%  . Exercise tolerance test - CPET-MET-TEST  July 2013   Submaximal effort. Only 80% of her, therefore peak VO2 of 75% is likely an underestimate. High resting heart rate, achieving 86% of heart rate. --> Unable to interpret  due to lack of effort.  [name] GASTRIC BYPASS  1980  . GASTRIC BYPASS    . KIDNEY STONE SURGERY    . LEFT HEART CATHETERIZATION WITH CORONARY ANGIOGRAM N/A 02/01/2014   Procedure: LEFT HEART CATHETERIZATION WITH CORONARY ANGIOGRAM;  Surgeon: Leonie Man, MD;  Location: [facility] CATH LAB;  Service: Cardiovascular: Angiographically normal coronaries  . NM MYOVIEW LTD  November 2015   4:20 min, 4.6 METS  --> DOE, but no chest discomfort; Significant + ST -T wave changes noted with NSVT & PVCs. Images suggest Inferior-inferolateral ischemia.  Low Risk. -- FALSE POSITIVE  . Pulmonary Function Tests  July 2013   Increased densities, decreased FVC - consistent with obesity hypoventilation; FEV1 is 58%, FVC 60% of predicted.  [name] UPPER GASTROINTESTINAL ENDOSCOPY     Current Meds  Medication Sig  . atorvastatin (LIPITOR) 20 MG tablet Take 1 tablet (20 mg total) by mouth daily.  . carvedilol (COREG) 12.5 MG tablet Take 1 tablet (12.5 mg total) by mouth 2 (two) times daily with a meal.  . lisinopril-hydrochlorothiazide (PRINZIDE,ZESTORETIC) 20-25 MG tablet Take 0.5 tablets by mouth daily.  . ranitidine (ZANTAC) 300 MG tablet Take 1 tablet (300 mg total) by mouth at bedtime.  . [DISCONTINUED] lisinopril-hydrochlorothiazide (PRINZIDE,ZESTORETIC) 20-25 MG tablet Take 1 tablet by mouth daily.   -- restarterd statin last year  No Known Allergies   Social History   Socioeconomic History  . Marital status: Married    Spouse name: None  . Number of children: 3  . Years of education: None  . Highest education level: None  Social Needs  . Financial resource strain: None  . Food insecurity - worry: None  . Food insecurity - inability: None  . Transportation needs - medical: None  . Transportation needs - non-medical: None  Occupational History  . Occupation: Retired  Tobacco Use  . Smoking status: Former Smoker    Packs/day: 1.00    Years: 30.00    Pack years: 30.00    Last attempt to quit: 01/18/2003    Years since quitting: 14.1  . Smokeless tobacco: Never Used  Substance and Sexual Activity  . Alcohol use: Yes    Alcohol/week: 1.8 oz    Types: 3 Cans of beer per week    Comment: drinks 2-3 beers a week  . Drug use: No  . Sexual activity: None  Other Topics Concern  . None  Social History Narrative   She is married to [name], also patient of Dr. Ellyn Hack.   Mother of 3,  grandmother 53.   Quit smoking in 2005. Social alcohol.   No routine exercise.   Family History  Problem Relation Age of Onset  . Kidney disease Daughter   . Multiple sclerosis Daughter   . Heart disease Father   . Heart disease Mother   . Thyroid disease Mother   . Heart disease Paternal Grandfather   . Colon cancer Other        early 40's.  Genetic testing from maternal side of family.  . Colon cancer Maternal Aunt   . Breast cancer Maternal Aunt   . Lung cancer Sister        smoker  . Colon cancer Maternal Uncle   . Diabetes Daughter        x3  . Heart disease Maternal Aunt   . Colon polyps Neg Hx   . Esophageal cancer Neg Hx   . Gallbladder disease Neg Hx   . Rectal cancer Neg  Hx   . Stomach cancer Neg Hx     Wt Readings from Last 3 Encounters:  02/18/17 236 lb 6.4 oz (107.2 kg)  02/09/17 238 lb (108 kg)  12/13/16 235 lb (106.6 kg)    PHYSICAL EXAM BP 134/82   Pulse 82   Ht 5' 5"  (1.651 m)   Wt 236 lb 6.4 oz (107.2 kg)   BMI 39.34 kg/m   Physical Exam  Constitutional: She is oriented to person, place, and time. She appears well-developed and well-nourished. No distress.  Morbidly obese.  HENT:  Head: Normocephalic and atraumatic.  Neck: No hepatojugular reflux and no JVD present. Carotid bruit is not present.  Cardiovascular: Normal rate and normal pulses.  Occasional extrasystoles are present. PMI is not displaced (Unable to palpate). Exam reveals no gallop.  Murmur heard.  Low-pitched blowing holosystolic murmur is present with a grade of 1/6 at the back. Pulmonary/Chest: Effort normal. No respiratory distress. She has no wheezes (No rales or rhonchi).  Distant breath sounds  Abdominal: Soft. Bowel sounds are normal. She exhibits no distension. There is no tenderness.  Musculoskeletal: Normal range of motion. She exhibits no edema.  Neurological: She is alert and oriented to person, place, and time.  Psychiatric: She has a normal mood and affect. Her  behavior is normal. Judgment and thought content normal.  Nursing note and vitals reviewed.  General appearance: alert, cooperative, appears stated age, no distress and morbidly obese - truncal obesity Neck: no adenopathy, no carotid bruit, no JVD and supple, symmetrical, trachea midline Lungs: CTAB, normal percussion bilaterally and Nonlabored, good movement Heart: normal apical impulse, RRR , S1&S2 normal, no S3 or S4 and 1/6 HSM at apex. No click. No R./G. Abdomen: soft, non-tender; bowel sounds normal; no masses, no organomegaly and Morbidly obese; unable to palpate organomegaly Extremities: edema Trace edema. No clubbing or cyanosis. Mild venous stasis changes. Pulses: 2+ and symmetric Neurologic: Grossly normal    Adult ECG Report  Rate: 97;  Rhythm: normal sinus rhythm and premature ventricular contractions (PVC); stable Inf TWI/nonspecific ST and T-wave changes.. Normal Axis & intervals  Narrative Interpretation: Stable EKG -  Other studies Reviewed: Additional studies/ records that were reviewed today include:  Recent Labs:  Checked today Lab Results  Component Value Date   CHOL 190 12/13/2016   HDL 53.20 12/13/2016   LDLCALC 56 02/18/2016   LDLDIRECT 93.0 12/13/2016   TRIG 237.0 (H) 12/13/2016   CHOLHDL 4 12/13/2016    Lab Results  Component Value Date   CREATININE 0.83 12/13/2016   BUN 27 (H) 12/13/2016   NA 143 12/13/2016   K 4.1 12/13/2016   CL 103 12/13/2016   CO2 27 12/13/2016    ASSESSMENT / PLAN: Problem List Items Addressed This Visit    Essential hypertension (Chronic)    Well-controlled on beta-blocker and ACE inhibitor-HCTZ,.      Relevant Medications   lisinopril-hydrochlorothiazide (PRINZIDE,ZESTORETIC) 20-25 MG tablet   Hyperlipidemia with target LDL less than 100 (Chronic)    Overall your cholesterol level looks pretty good.  Her triglycerides are little bit high.  This is probably more related to dietary intake. Continue statin.  Discussed  adjusting diet to avoid excessive starchy vegetables.      Relevant Medications   lisinopril-hydrochlorothiazide (PRINZIDE,ZESTORETIC) 20-25 MG tablet   HYPERTRIGLYCERIDEMIA    Chronic issue.  He now only on atorvastatin, had previously been on fenofibrate.  Will need to follow levels to ensure that is stable.  Relevant Medications   lisinopril-hydrochlorothiazide (PRINZIDE,ZESTORETIC) 20-25 MG tablet   Irregular heart beat (Chronic)    Pretty stable now on current dose of beta-blocker.      Relevant Orders   EKG 12-Lead   Pulmonary emphysema (HCC) (Chronic)   Relevant Orders   EKG 12-Lead   SOB (shortness of breath) - Primary (Chronic)    Probably more related to her pulmonary disease.  Negative cardiac evaluation. May consider intermittently checking coronary CT angiogram to reassess.         Current medicines are reviewed at length with the patient today. (+/- concerns) none The following changes have been made: none  Patient Instructions  MEDICATION INSTRUCTIONS  --- DECREASE LISINOPRIL /HCTZ 20/25 TO 1/2 TABLET  A DAY . IF BLOOD PRESSURE INCREASE MAY TAKE WHOLE TABLET FOR A FEW DAYS THEN RETURN TO 1/2 TABLET    Your physician wants you to follow-up in 49 MONTHS WITH DR HARDING. You will receive a reminder letter in the mail two months in advance. If you don't receive a letter, please call our office to schedule the follow-up appointment.  If you need a refill on your cardiac medications before your next appointment, please call your pharmacy.   Studies Ordered:   Orders Placed This Encounter  Procedures  . EKG 12-Lead     Glenetta Hew, M.D., M.S. Interventional Cardiologist   Pager # [phone]

## 2017-02-20 ENCOUNTER — Encounter: Payer: Self-pay | Admitting: Cardiology

## 2017-02-20 NOTE — Assessment & Plan Note (Signed)
Well-controlled on beta-blocker and ACE inhibitor-HCTZ,.

## 2017-02-20 NOTE — Assessment & Plan Note (Signed)
Chronic issue.  He now only on atorvastatin, had previously been on fenofibrate.  Will need to follow levels to ensure that is stable.

## 2017-02-20 NOTE — Assessment & Plan Note (Signed)
Overall your cholesterol level looks pretty good.  Her triglycerides are little bit high.  This is probably more related to dietary intake. Continue statin.  Discussed adjusting diet to avoid excessive starchy vegetables.

## 2017-02-20 NOTE — Assessment & Plan Note (Signed)
Probably more related to her pulmonary disease.  Negative cardiac evaluation. May consider intermittently checking coronary CT angiogram to reassess.

## 2017-02-20 NOTE — Assessment & Plan Note (Signed)
Was unfortunately,With her COPD, she does not have the oxygenation level to tolerate activity.  Perhaps of her COPD with less problematic, she can get some exercise.  Also discussed dietary modification.

## 2017-02-20 NOTE — Assessment & Plan Note (Signed)
Pretty stable now on current dose of beta-blocker.

## 2017-02-23 ENCOUNTER — Other Ambulatory Visit: Payer: Self-pay | Admitting: Emergency Medicine

## 2017-02-23 ENCOUNTER — Encounter: Payer: Self-pay | Admitting: Internal Medicine

## 2017-02-23 DIAGNOSIS — I10 Essential (primary) hypertension: Secondary | ICD-10-CM

## 2017-02-23 MED ORDER — LISINOPRIL-HYDROCHLOROTHIAZIDE 20-25 MG PO TABS: 0.5000 | ORAL_TABLET | Freq: Every day | ORAL | 1 refills | Status: DC

## 2017-03-18 IMAGING — MR MR LUMBAR SPINE W/O CM
4 of 5 series · 25 of 48 positions shown · non-contrast
Comparison: 01/04/2005

CLINICAL DATA: Back pain, 1 year duration. Worsening over the last
2 weeks.

EXAM:
MRI LUMBAR SPINE WITHOUT CONTRAST
TECHNIQUE: Multiplanar, multisequence MR imaging of the lumbar spine was
performed. No intravenous contrast was administered.

[Series 4: T1 · sagittal · 4.0mm · 0.55mm/px · 7 of 14 slices shown (1 of 2)]
[im 1/14]
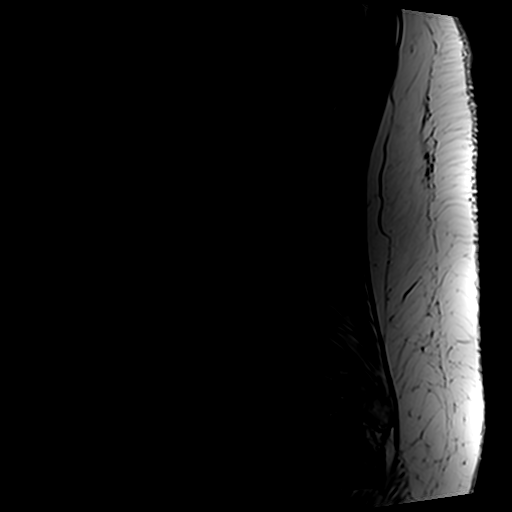
[im 3/14]
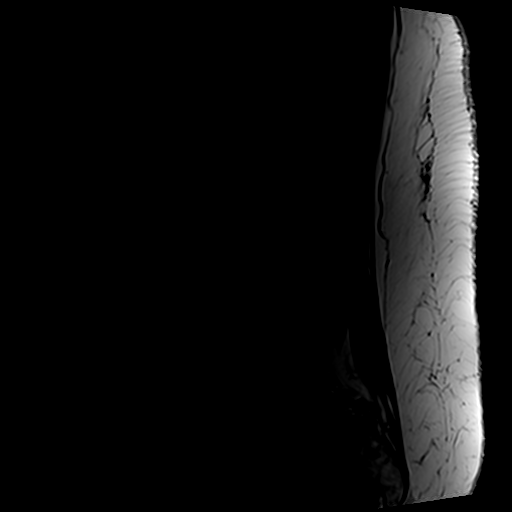
[im 5/14]
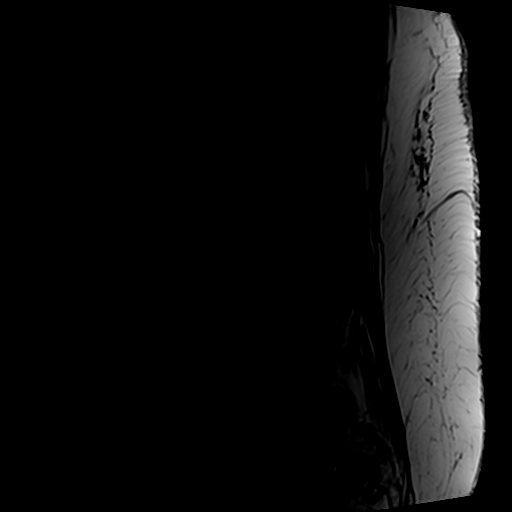
[im 7/14]
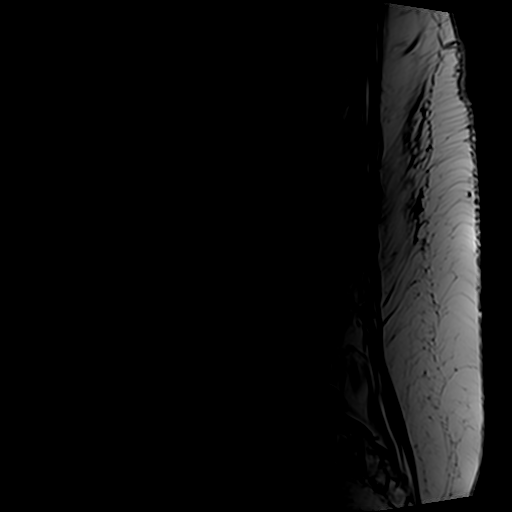
[im 9/14]
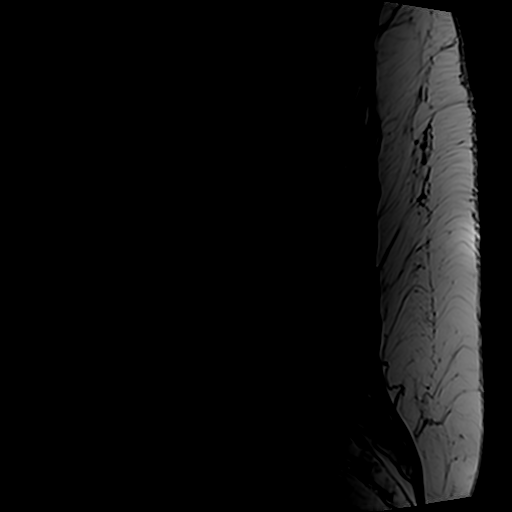
[im 11/14]
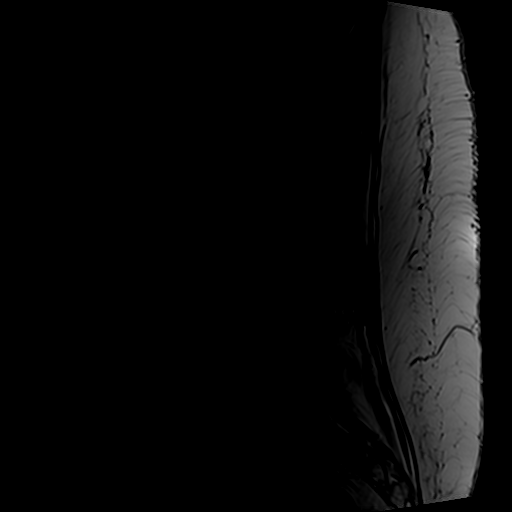
[im 14/14]
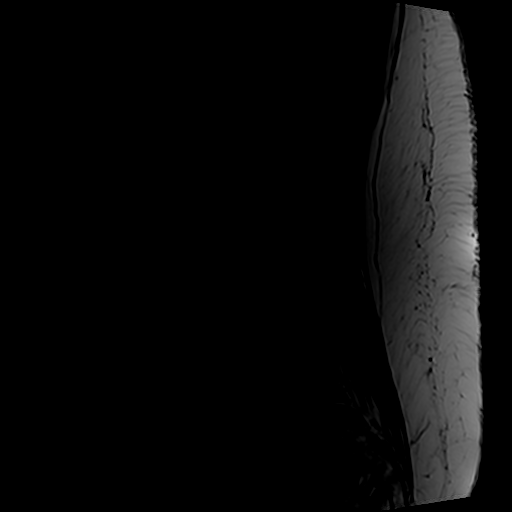

[Series 5: T2 post-contrast · sagittal · 4.0mm · 0.55mm/px · 6 of 14 slices shown]
[im 1/14]
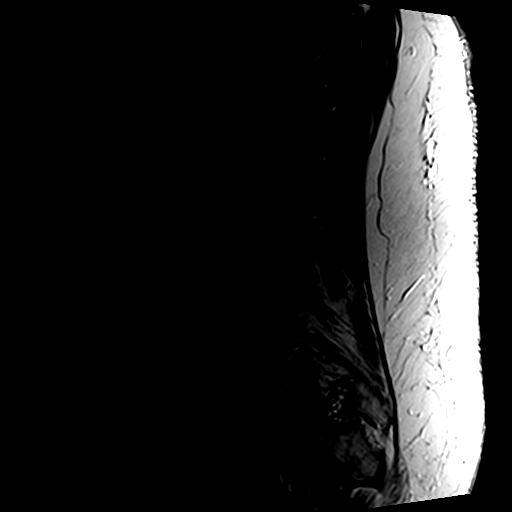
[im 3/14]
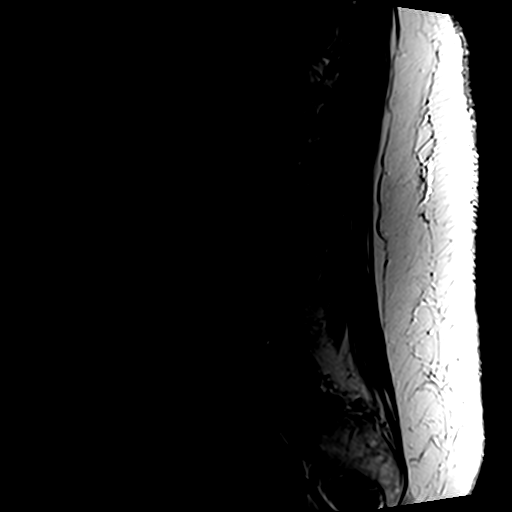
[im 6/14]
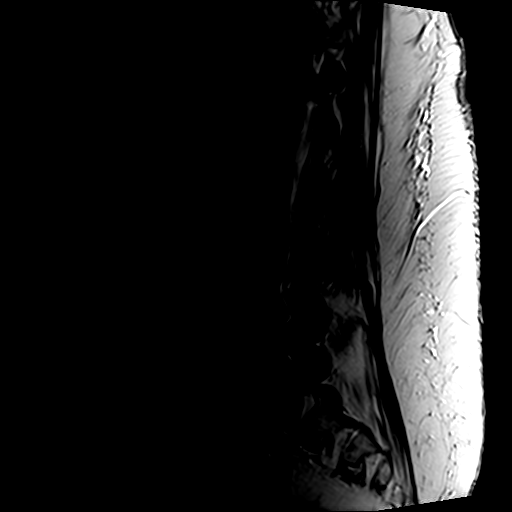
[im 8/14]
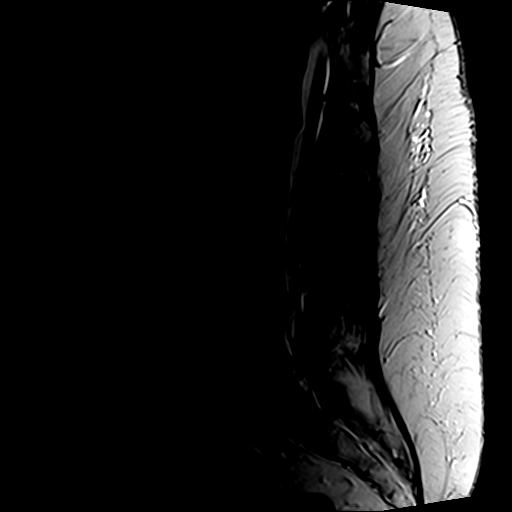
[im 11/14]
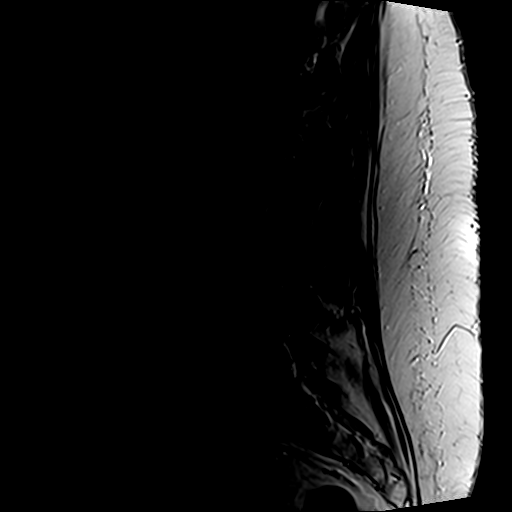
[im 14/14]
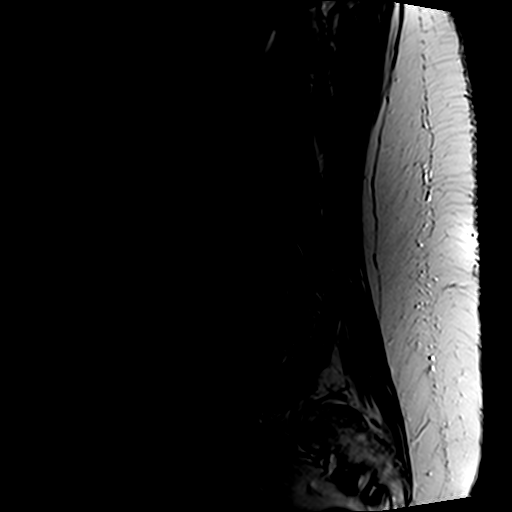

[Series 6: T2 · axial · 4.0mm · 0.70mm/px · z∈[-44,+141]mm · 8 of 32 slices shown]
[im 1/32]
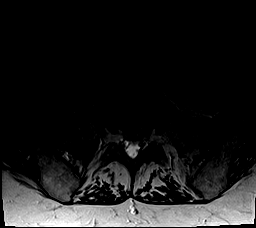
[im 5/32]
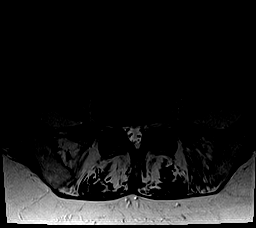
[im 10/32]
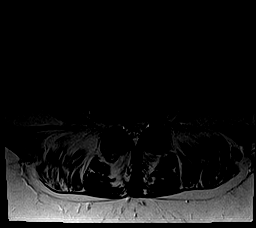
[im 15/32]
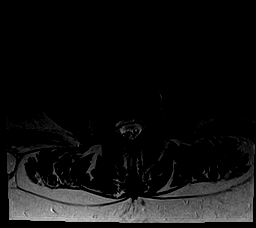
[im 17/32]
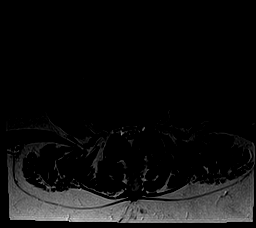
[im 22/32]
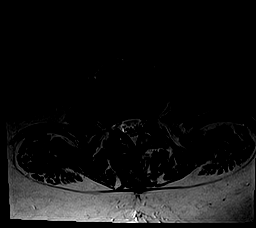
[im 27/32]
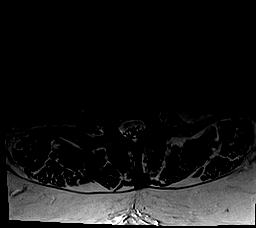
[im 32/32]
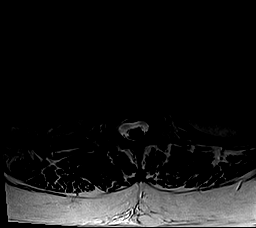

[Series 7: T1 · axial · 4.0mm · 0.35mm/px · z∈[-44,+117]mm · 4 of 32 slices shown (2 of 2)]
[im 1/32]
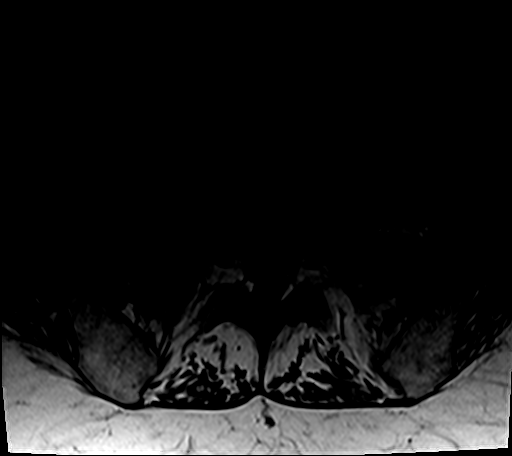
[im 5/32]
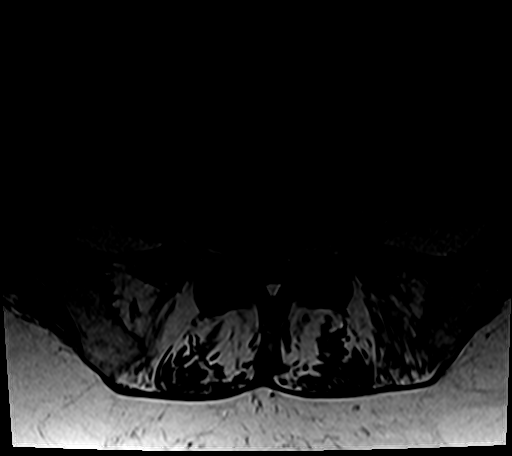
[im 17/32]
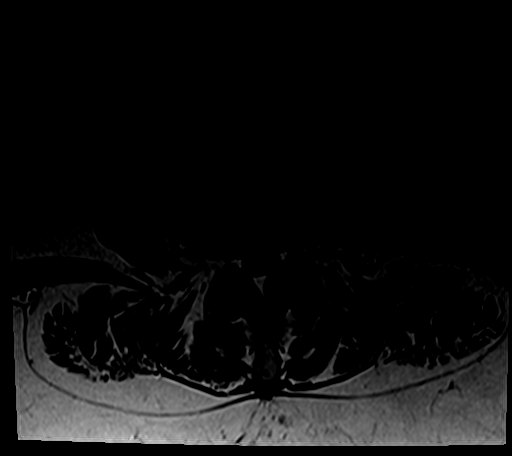
[im 27/32]
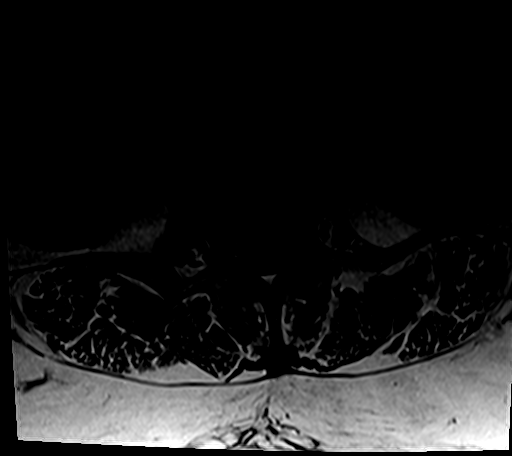

[25 of 48 positions shown; findings below may reference images not displayed]

FINDINGS: There is mild curvature convex to the right with the apex at L3.

T12-L1 is normal.

L1-2: Mild bulging of the disc. No compressive stenosis. Conus tip
at L2.

L2-3: Mild bulging of the disc.  No stenosis.

L3-4: Mild bulging of the disc. Mild facet hypertrophy. No stenosis.

L4-5: Disc degeneration with annular tearing and annular bulging
centrally. This contacts the thecal sac but does not appear to cause
neural compression. Mild facet hypertrophy.

L5-S1: Mild bulging of the disc. Mild facet degeneration and
hypertrophy. Mild foraminal narrowing on the right without gross
neural compression.

When compared to the study of 1770, the findings are quite similar.
IMPRESSION: Curvature convex to the right. Mild degenerative disc disease and
degenerative facet disease as outlined above. No apparent
compressive stenosis. Annular fissures with annular bulging at L4-5.
Slight indentation of the thecal sac but no apparent neural
compression. This could be associated with back pain.

## 2017-04-06 ENCOUNTER — Emergency Department (HOSPITAL_BASED_OUTPATIENT_CLINIC_OR_DEPARTMENT_OTHER)
Admission: EM | Admit: 2017-04-06 | Discharge: 2017-04-06 | Disposition: A | Discharge status: Home / Self Care | Payer: Medicare Other | Attending: Emergency Medicine | Admitting: Emergency Medicine

## 2017-04-06 ENCOUNTER — Other Ambulatory Visit: Payer: Self-pay | Admitting: *Deleted

## 2017-04-06 ENCOUNTER — Other Ambulatory Visit: Payer: Self-pay

## 2017-04-06 ENCOUNTER — Encounter (HOSPITAL_BASED_OUTPATIENT_CLINIC_OR_DEPARTMENT_OTHER): Payer: Self-pay

## 2017-04-06 ENCOUNTER — Emergency Department (HOSPITAL_BASED_OUTPATIENT_CLINIC_OR_DEPARTMENT_OTHER): Payer: Medicare Other

## 2017-04-06 DIAGNOSIS — M109 Gout, unspecified: Secondary | ICD-10-CM | POA: Diagnosis not present

## 2017-04-06 DIAGNOSIS — I10 Essential (primary) hypertension: Secondary | ICD-10-CM | POA: Diagnosis not present

## 2017-04-06 DIAGNOSIS — M79672 Pain in left foot: Secondary | ICD-10-CM | POA: Diagnosis not present

## 2017-04-06 DIAGNOSIS — Z79899 Other long term (current) drug therapy: Secondary | ICD-10-CM | POA: Insufficient documentation

## 2017-04-06 DIAGNOSIS — Z87891 Personal history of nicotine dependence: Secondary | ICD-10-CM | POA: Diagnosis not present

## 2017-04-06 DIAGNOSIS — R2242 Localized swelling, mass and lump, left lower limb: Secondary | ICD-10-CM | POA: Insufficient documentation

## 2017-04-06 DIAGNOSIS — M7989 Other specified soft tissue disorders: Secondary | ICD-10-CM | POA: Diagnosis not present

## 2017-04-06 LAB — CBC WITH DIFFERENTIAL/PLATELET
Basophils Absolute: 0 10*3/uL (ref 0.0–0.1)
Basophils Relative: 0 %
EOS ABS: 0.3 10*3/uL (ref 0.0–0.7)
EOS PCT: 4 %
HCT: 37 % (ref 36.0–46.0)
HEMOGLOBIN: 12.1 g/dL (ref 12.0–15.0)
LYMPHS ABS: 1.3 10*3/uL (ref 0.7–4.0)
Lymphocytes Relative: 17 %
MCH: 31.1 pg (ref 26.0–34.0)
MCHC: 32.7 g/dL (ref 30.0–36.0)
MCV: 95.1 fL (ref 78.0–100.0)
MONO ABS: 0.8 10*3/uL (ref 0.1–1.0)
MONOS PCT: 11 %
Neutro Abs: 5.1 10*3/uL (ref 1.7–7.7)
Neutrophils Relative %: 68 %
PLATELETS: 154 10*3/uL (ref 150–400)
RBC: 3.89 MIL/uL (ref 3.87–5.11)
RDW: 13.3 % (ref 11.5–15.5)
WBC: 7.5 10*3/uL (ref 4.0–10.5)

## 2017-04-06 LAB — BASIC METABOLIC PANEL
Anion gap: 9 (ref 5–15)
BUN: 18 mg/dL (ref 6–20)
CHLORIDE: 105 mmol/L (ref 101–111)
CO2: 26 mmol/L (ref 22–32)
CREATININE: 0.67 mg/dL (ref 0.44–1.00)
Calcium: 8.8 mg/dL — ABNORMAL LOW (ref 8.9–10.3)
GFR calc Af Amer: >60 mL/min (ref >60)
GFR calc non Af Amer: >60 mL/min (ref >60)
Glucose, Bld: 120 mg/dL — ABNORMAL HIGH (ref 65–99)
Potassium: 4 mmol/L (ref 3.5–5.1)
Sodium: 140 mmol/L (ref 135–145)

## 2017-04-06 LAB — URIC ACID: URIC ACID, SERUM: 7.2 mg/dL — AB (ref 2.3–6.6)

## 2017-04-06 MED ORDER — LISINOPRIL-HYDROCHLOROTHIAZIDE 20-25 MG PO TABS: 0.5000 | ORAL_TABLET | Freq: Every day | ORAL | 3 refills | Status: DC

## 2017-04-06 MED ORDER — MORPHINE SULFATE (PF) 4 MG/ML IV SOLN
4.0000 mg | Freq: Once | INTRAVENOUS | Status: AC
  Administered 2017-04-06: 4 mg via INTRAMUSCULAR
  Filled 2017-04-06: qty 1

## 2017-04-06 MED ORDER — PREDNISONE 10 MG (21) PO TBPK: ORAL_TABLET | ORAL | 0 refills | Status: DC

## 2017-04-06 MED ORDER — HYDROCODONE-ACETAMINOPHEN 5-325 MG PO TABS: 1.0000 | ORAL_TABLET | ORAL | 0 refills | Status: DC | PRN

## 2017-04-06 MED ORDER — ONDANSETRON 4 MG PO TBDP
4.0000 mg | ORAL_TABLET | Freq: Once | ORAL | Status: AC
  Administered 2017-04-06: 4 mg via ORAL
  Filled 2017-04-06: qty 1

## 2017-04-06 MED ORDER — PREDNISONE 50 MG PO TABS
60.0000 mg | ORAL_TABLET | Freq: Once | ORAL | Status: AC
  Administered 2017-04-06: 60 mg via ORAL
  Filled 2017-04-06: qty 1

## 2017-04-06 MED FILL — HYDROCODON-APAP 5-325: 5-325 | 2 days supply | Qty: 10 | Fill #0

## 2017-04-06 MED FILL — predniSONE 10 MG TABS: 10 | 12 days supply | Qty: 42 | Fill #0

## 2017-04-06 NOTE — ED Provider Notes (Signed)
[facility] EMERGENCY DEPARTMENT Provider Note   CSN: [id number] Arrival date & time: 04/06/17  1218     History   Chief Complaint Chief Complaint  Patient presents with  . Foot Pain    HPI April Combs is a 72 y.o. female.  Pt presents to the ED today with left foot swelling and pain.  The pt denies any injury.  She denies any f/c.  She has never had gout.      Past Medical History:  Diagnosis Date  . Anemia   . Anxiety   . Coronary artery disease (CAD) excluded November 2015   Low Risk Myoview (false positive) with possible inferior-inferolateral ischemia, but with nonsustained VT --> CATH --> Angiographically Normal Coronary Arteries  . Depression   . GERD (gastroesophageal reflux disease)   . Hyperlipidemia   . Hyperplastic colon polyp   . Hypertension   . NICM (nonischemic cardiomyopathy) ([place])    Essentially resolved. Echocardiogram July 2013 showed normal EF greater than 55%. Important septal motion. Normal LV filling pressures. Mild MR and mild anterior MVP  . Obesity (BMI 30-39.9)   . Osteoarthritis   . Positional vertigo     Patient Active Problem List   Diagnosis Date Noted  . Elevated TSH 02/09/2017  . Morbid obesity ([place]) 10/15/2015  . Dyspnea and respiratory abnormality 04/29/2015  . Pulmonary emphysema ([place]) 04/29/2015  . BRBPR (bright red blood per rectum) 02/05/2015  . Lumbar radiculopathy 12/24/2014  . Hx of tobacco use, presenting hazards to health 09/29/2014  . Nail fungus 03/11/2014  . FALSE POSITIVE NUCLEAR STRESS CHEST 01/28/2014  . Chest pain on exertion 01/14/2014  . GERD (gastroesophageal reflux disease) 05/15/2012  . Positional vertigo 12/22/2011  . Irregular heart beat 09/29/2011  . Fungal dermatitis 09/29/2011  . Hyperlipidemia with target LDL less than 100 09/29/2011  . SOB (shortness of breath) 08/23/2011  . HYPERTRIGLYCERIDEMIA 07/12/2008  . DEPRESSION 05/28/2008  . Essential hypertension 05/28/2008  .  OSTEOARTHRITIS 05/28/2008    Past Surgical History:  Procedure Laterality Date  . ABDOMINAL HYSTERECTOMY    . APPENDECTOMY  as child  . BREAST BIOPSY Right    benign  . CHOLECYSTECTOMY    . COLONOSCOPY    . DOPPLER ECHOCARDIOGRAPHY  July 2013   09/20/11. normal Lv thickness and function with EF greater thatn 55%  . Exercise tolerance test - CPET-MET-TEST  July 2013   Submaximal effort. Only 80% of her, therefore peak VO2 of 75% is likely an underestimate. High resting heart rate, achieving 86% of heart rate. --> Unable to interpret due to lack of effort.  [name] GASTRIC BYPASS  1980  . GASTRIC BYPASS    . KIDNEY STONE SURGERY    . LEFT HEART CATHETERIZATION WITH CORONARY ANGIOGRAM N/A 02/01/2014   Procedure: LEFT HEART CATHETERIZATION WITH CORONARY ANGIOGRAM;  Surgeon: Leonie Man, MD;  Location: [facility] CATH LAB;  Service: Cardiovascular: Angiographically normal coronaries  . NM MYOVIEW LTD  November 2015   4:20 min, 4.6 METS --> DOE, but no chest discomfort; Significant + ST -T wave changes noted with NSVT & PVCs. Images suggest Inferior-inferolateral ischemia.  Low Risk. -- FALSE POSITIVE  . Pulmonary Function Tests  July 2013   Increased densities, decreased FVC - consistent with obesity hypoventilation; FEV1 is 58%, FVC 60% of predicted.  [name] UPPER GASTROINTESTINAL ENDOSCOPY      OB History    No data available       Home Medications    Prior to  Admission medications   Medication Sig Start Date End Date Taking? Authorizing Provider  atorvastatin (LIPITOR) 20 MG tablet Take 1 tablet (20 mg total) by mouth daily. 02/09/17   Binnie Rail, MD  carvedilol (COREG) 12.5 MG tablet Take 1 tablet (12.5 mg total) by mouth 2 (two) times daily with a meal. 02/09/17   Burns, Claudina Lick, MD  HYDROcodone-acetaminophen (NORCO/VICODIN) 5-325 MG tablet Take 1 tablet by mouth every 4 (four) hours as needed. 04/06/17   Isla Pence, MD  lisinopril-hydrochlorothiazide (PRINZIDE,ZESTORETIC) 20-25 MG  tablet Take 0.5 tablets by mouth daily. 04/06/17   Leonie Man, MD  predniSONE (STERAPRED UNI-PAK 21 TAB) 10 MG (21) TBPK tablet Take 6 tabs by mouth daily  for 2 days, then 5 tabs for 2 days, then 4 tabs for 2 days, then 3 tabs for 2 days, 2 tabs for 2 days, then 1 tab by mouth daily for 2 days 04/06/17   Isla Pence, MD  ranitidine (ZANTAC) 300 MG tablet Take 1 tablet (300 mg total) by mouth at bedtime. 02/09/17   Binnie Rail, MD    Family History Family History  Problem Relation Age of Onset  . Kidney disease Daughter   . Multiple sclerosis Daughter   . Heart disease Father   . Heart disease Mother   . Thyroid disease Mother   . Heart disease Paternal Grandfather   . Colon cancer Other        early 40's.  Genetic testing from maternal side of family.  . Colon cancer Maternal Aunt   . Breast cancer Maternal Aunt   . Lung cancer Sister        smoker  . Colon cancer Maternal Uncle   . Diabetes Daughter        x3  . Heart disease Maternal Aunt   . Colon polyps Neg Hx   . Esophageal cancer Neg Hx   . Gallbladder disease Neg Hx   . Rectal cancer Neg Hx   . Stomach cancer Neg Hx     Social History Social History   Tobacco Use  . Smoking status: Former Smoker    Packs/day: 1.00    Years: 30.00    Pack years: 30.00    Last attempt to quit: 01/18/2003    Years since quitting: 14.2  . Smokeless tobacco: Never Used  Substance Use Topics  . Alcohol use: Yes    Alcohol/week: 1.8 oz    Types: 3 Cans of beer per week    Comment: drinks 2-3 beers a week  . Drug use: No     Allergies   Patient has no known allergies.   Review of Systems Review of Systems  Musculoskeletal:       Left foot pain  All other systems reviewed and are negative.    Physical Exam Updated Vital Signs BP (!) 168/105 (BP Location: Right Arm)   Pulse (!) 50   Temp 98.2 F (36.8 C) (Oral)   Resp 18   Ht _0  (1.651 m)   Wt 106.1 kg (234 lb)   SpO2 98%   BMI 38.94 kg/m    Physical Exam  Constitutional: She is oriented to person, place, and time. She appears well-developed and well-nourished.  HENT:  Head: Normocephalic and atraumatic.  Right Ear: External ear normal.  Left Ear: External ear normal.  Nose: Nose normal.  Mouth/Throat: Oropharynx is clear and moist.  Eyes: Conjunctivae and EOM are normal. Pupils are equal, round, and reactive to light.  Neck: Normal range of motion. Neck supple.  Cardiovascular: Normal rate, regular rhythm, normal heart sounds and intact distal pulses.  Pulmonary/Chest: Effort normal and breath sounds normal.  Abdominal: Soft. Bowel sounds are normal.  Musculoskeletal:  Left foot swollen c/w gout.  See pictures.  Neurological: She is alert and oriented to person, place, and time.  Nursing note and vitals reviewed.        ED Treatments / Results  Labs (all labs ordered are listed, but only abnormal results are displayed) Labs Reviewed  BASIC METABOLIC PANEL - Abnormal; Notable for the following components:      Result Value   Glucose, Bld 120 (*)    Calcium 8.8 (*)    All other components within normal limits  URIC ACID - Abnormal; Notable for the following components:   Uric Acid, Serum 7.2 (*)    All other components within normal limits  CBC WITH DIFFERENTIAL/PLATELET    EKG  EKG Interpretation None       Radiology Dg Foot Complete Left  Result Date: 04/06/2017 CLINICAL DATA:  Left foot pain, redness/swelling EXAM: LEFT FOOT - COMPLETE 3+ VIEW COMPARISON:  04/05/2015 FINDINGS: No fracture or dislocation is seen. The joint spaces are preserved. Moderate soft tissue swelling along the dorsal forefoot. Small plantar and posterior calcaneal these fight. IMPRESSION: No fracture or dislocation is seen. Moderate dorsal soft tissue swelling. Electronically Signed   By: Julian Hy M.D.   On: 04/06/2017 13:36    Procedures Procedures (including critical care time)  Medications Ordered in  ED Medications  predniSONE (DELTASONE) tablet 60 mg (not administered)  morphine 4 MG/ML injection 4 mg (4 mg Intramuscular Given 04/06/17 1317)  ondansetron (ZOFRAN-ODT) disintegrating tablet 4 mg (4 mg Oral Given 04/06/17 1318)     Initial Impression / Assessment and Plan / ED Course  I have reviewed the triage vital signs and the nursing notes.  Pertinent labs & imaging results that were available during my care of the patient were reviewed by me and considered in my medical decision making (see chart for details).     I suspect pt has gout.  Pt started on lortab and prednisone.  We spoke about a low purine diet.  She "lives off cheeseburgers" and is encouraged to avoid them among other things.  She knows to return if worse.  Final Clinical Impressions(s) / ED Diagnoses   Final diagnoses:  Acute gout of left foot, unspecified cause    ED Discharge Orders        Ordered    predniSONE (STERAPRED UNI-PAK 21 TAB) 10 MG (21) TBPK tablet     04/06/17 1405    HYDROcodone-acetaminophen (NORCO/VICODIN) 5-325 MG tablet  Every 4 hours PRN     04/06/17 1405       Isla Pence, MD 04/06/17 1406

## 2017-04-06 NOTE — ED Notes (Signed)
PT'S SPOUSE WAS OFFERED TO ASSIST WIFE IN WHEELCHAIR TO THE PHARMACY.HUSBAND SAID HE WOULD DO IT. FOLLOWED PT TO REGISTRATION TO PUSH TO PHARMACY ANYWAY AND RN (ANDREA) SAID SHE WOULD PUSH THEM DOWN THERE. RN ([place]) WAS NOTIFIED.

## 2017-04-06 NOTE — Telephone Encounter (Signed)
REQUEST RX FOR WIFE AT OFFICE APPOINTMENT

## 2017-04-06 NOTE — ED Triage Notes (Signed)
C/o pain, swelling to left foot-denies injury-presents to triage in w/c-NAD

## 2017-04-10 DIAGNOSIS — M109 Gout, unspecified: Secondary | ICD-10-CM | POA: Insufficient documentation

## 2017-04-10 HISTORY — DX: Gout, unspecified: M10.9

## 2017-04-10 NOTE — Progress Notes (Signed)
Subjective:    Patient ID: April Combs, female    DOB: July 02, 1945, 72 y.o.   MRN: [id number]  HPI The patient is here for follow up from the ED.  She went to the ED 04/06/17 for left foot pain and swelling.  She denied injury.  She denied fever or chills.  She denied prior history of gout. She had blood work done and her uric acid level was 7.2.  She received morphine and zofran in the ED.  She was diagnosed with gout.  She stated that she "lives on cheeseburgers".  She was prescribed a prednisone taper and norco.    Gout: her foot feels better.  The swelling, pain and redness have improved.  Her pain is a 4/10 instead of a 10+/10.  The swelling is better, but it is still slightly swollen.  The redness has resolved.   SOB:  She felt short of breath with exertion this past weekend more than her chronic SOB with exertion.  It has gotten a little better.  She felt swollen in her chest/abdomen, but denied any leg swelling.  She feels it was related to the prednisone.  She has COPD and has recently seen the cardiology for her chronic SOB.  Medications and allergies reviewed with patient and updated if appropriate.  Patient Active Problem List   Diagnosis Date Noted  . Acute gout of left foot 04/10/2017  . Elevated TSH 02/09/2017  . Morbid obesity ([facility]) 10/15/2015  . Dyspnea and respiratory abnormality 04/29/2015  . Pulmonary emphysema ([place]) 04/29/2015  . BRBPR (bright red blood per rectum) 02/05/2015  . Lumbar radiculopathy 12/24/2014  . Hx of tobacco use, presenting hazards to health 09/29/2014  . Nail fungus 03/11/2014  . FALSE POSITIVE NUCLEAR STRESS CHEST 01/28/2014  . Chest pain on exertion 01/14/2014  . GERD (gastroesophageal reflux disease) 05/15/2012  . Positional vertigo 12/22/2011  . Irregular heart beat 09/29/2011  . Fungal dermatitis 09/29/2011  . Hyperlipidemia with target LDL less than 100 09/29/2011  . SOB (shortness of breath) 08/23/2011  . HYPERTRIGLYCERIDEMIA  07/12/2008  . DEPRESSION 05/28/2008  . Essential hypertension 05/28/2008  . OSTEOARTHRITIS 05/28/2008    Current Outpatient Medications on File Prior to Visit  Medication Sig Dispense Refill  . atorvastatin (LIPITOR) 20 MG tablet Take 1 tablet (20 mg total) by mouth daily. 90 tablet 3  . carvedilol (COREG) 12.5 MG tablet Take 1 tablet (12.5 mg total) by mouth 2 (two) times daily with a meal. 180 tablet 3  . HYDROcodone-acetaminophen (NORCO/VICODIN) 5-325 MG tablet Take 1 tablet by mouth every 4 (four) hours as needed. 10 tablet 0  . lisinopril-hydrochlorothiazide (PRINZIDE,ZESTORETIC) 20-25 MG tablet Take 0.5 tablets by mouth daily. 45 tablet 3  . predniSONE (STERAPRED UNI-PAK 21 TAB) 10 MG (21) TBPK tablet Take 6 tabs by mouth daily  for 2 days, then 5 tabs for 2 days, then 4 tabs for 2 days, then 3 tabs for 2 days, 2 tabs for 2 days, then 1 tab by mouth daily for 2 days 42 tablet 0  . ranitidine (ZANTAC) 300 MG tablet Take 1 tablet (300 mg total) by mouth at bedtime. 90 tablet 3   No current facility-administered medications on file prior to visit.     Past Medical History:  Diagnosis Date  . Anemia   . Anxiety   . Coronary artery disease (CAD) excluded November 2015   Low Risk Myoview (false positive) with possible inferior-inferolateral ischemia, but with nonsustained VT --> CATH -->  Angiographically Normal Coronary Arteries  . Depression   . GERD (gastroesophageal reflux disease)   . Hyperlipidemia   . Hyperplastic colon polyp   . Hypertension   . NICM (nonischemic cardiomyopathy) ([place])    Essentially resolved. Echocardiogram July 2013 showed normal EF greater than 55%. Important septal motion. Normal LV filling pressures. Mild MR and mild anterior MVP  . Obesity (BMI 30-39.9)   . Osteoarthritis   . Positional vertigo     Past Surgical History:  Procedure Laterality Date  . ABDOMINAL HYSTERECTOMY    . APPENDECTOMY  as child  . BREAST BIOPSY Right    benign  .  CHOLECYSTECTOMY    . COLONOSCOPY    . DOPPLER ECHOCARDIOGRAPHY  July 2013   09/20/11. normal Lv thickness and function with EF greater thatn 55%  . Exercise tolerance test - CPET-MET-TEST  July 2013   Submaximal effort. Only 80% of her, therefore peak VO2 of 75% is likely an underestimate. High resting heart rate, achieving 86% of heart rate. --> Unable to interpret due to lack of effort.  [name] GASTRIC BYPASS  1980  . GASTRIC BYPASS    . KIDNEY STONE SURGERY    . LEFT HEART CATHETERIZATION WITH CORONARY ANGIOGRAM N/A 02/01/2014   Procedure: LEFT HEART CATHETERIZATION WITH CORONARY ANGIOGRAM;  Surgeon: Leonie Man, MD;  Location: [facility] CATH LAB;  Service: Cardiovascular: Angiographically normal coronaries  . NM MYOVIEW LTD  November 2015   4:20 min, 4.6 METS --> DOE, but no chest discomfort; Significant + ST -T wave changes noted with NSVT & PVCs. Images suggest Inferior-inferolateral ischemia.  Low Risk. -- FALSE POSITIVE  . Pulmonary Function Tests  July 2013   Increased densities, decreased FVC - consistent with obesity hypoventilation; FEV1 is 58%, FVC 60% of predicted.  [name] UPPER GASTROINTESTINAL ENDOSCOPY      Social History   Socioeconomic History  . Marital status: Married    Spouse name: None  . Number of children: 3  . Years of education: None  . Highest education level: None  Social Needs  . Financial resource strain: None  . Food insecurity - worry: None  . Food insecurity - inability: None  . Transportation needs - medical: None  . Transportation needs - non-medical: None  Occupational History  . Occupation: Retired  Tobacco Use  . Smoking status: Former Smoker    Packs/day: 1.00    Years: 30.00    Pack years: 30.00    Last attempt to quit: 01/18/2003    Years since quitting: 14.2  . Smokeless tobacco: Never Used  Substance and Sexual Activity  . Alcohol use: Yes    Alcohol/week: 1.8 oz    Types: 3 Cans of beer per week    Comment: drinks 2-3 beers a week  . Drug  use: No  . Sexual activity: None  Other Topics Concern  . None  Social History Narrative   She is married to [name], also patient of Dr. Ellyn Hack.   Mother of 3, grandmother 58.   Quit smoking in 2005. Social alcohol.   No routine exercise.    Family History  Problem Relation Age of Onset  . Kidney disease Daughter   . Multiple sclerosis Daughter   . Heart disease Father   . Heart disease Mother   . Thyroid disease Mother   . Heart disease Paternal Grandfather   . Colon cancer Other        early 58's.  Genetic testing from maternal side of  family.  . Colon cancer Maternal Aunt   . Breast cancer Maternal Aunt   . Lung cancer Sister        smoker  . Colon cancer Maternal Uncle   . Diabetes Daughter        x3  . Heart disease Maternal Aunt   . Colon polyps Neg Hx   . Esophageal cancer Neg Hx   . Gallbladder disease Neg Hx   . Rectal cancer Neg Hx   . Stomach cancer Neg Hx     Review of Systems  Constitutional: Negative for chills and fever.  Respiratory: Positive for cough (couple of weeks) and shortness of breath. Negative for wheezing.   Cardiovascular: Positive for palpitations. Negative for chest pain and leg swelling.  Gastrointestinal: Positive for nausea.       No gerd  Skin: Negative for color change.       Left foot still swollen - minimal, pain at 4/10.  Neurological: Negative for headaches.       Objective:   Vitals:   04/11/17 1010  BP: (!) 162/92  Pulse: 94  Resp: 20  Temp: 98 F (36.7 C)  SpO2: 93%   Wt Readings from Last 3 Encounters:  04/11/17 239 lb (108.4 kg)  04/06/17 234 lb (106.1 kg)  02/18/17 236 lb 6.4 oz (107.2 kg)   Body mass index is 39.77 kg/m.   Physical Exam    Constitutional: Appears well-developed and well-nourished. No distress.  HENT:  Head: Normocephalic and atraumatic.  Neck: Neck supple. No tracheal deviation present. No thyromegaly present.  No cervical lymphadenopathy Cardiovascular: Normal rate, regular  rhythm and normal heart sounds.   1/6 systolic murmur heard. No carotid bruit .  No ankle edema Pulmonary/Chest: Effort normal and breath sounds normal. No respiratory distress. No has no wheezes. No rales.  Msk:  Left lateral ankle with mild swelling, tenderness and warm - no erythema Skin: Skin is warm and dry. Not diaphoretic.  Psychiatric: Normal mood and affect. Behavior is normal.      Assessment & Plan:    See Problem List for Assessment and Plan of chronic medical problems.

## 2017-04-10 NOTE — Patient Instructions (Addendum)
Test(s) ordered today. Your results will be released to [place] (or called to you) after review, usually within 72hours after test completion. If any changes need to be made, you will be notified at that same time.  Medications reviewed and updated.  No changes recommended at this time.       Low-Purine Diet Purines are compounds that affect the level of uric acid in your body. A low-purine diet is a diet that is low in purines. Eating a low-purine diet can prevent the level of uric acid in your body from getting too high and causing gout or kidney stones or both. What do I need to know about this diet?  Choose low-purine foods. Examples of low-purine foods are listed in the next section.  Drink plenty of fluids, especially water. Fluids can help remove uric acid from your body. Try to drink 8-16 cups (1.9-3.8 L) a day.  Limit foods high in fat, especially saturated fat, as fat makes it harder for the body to get rid of uric acid. Foods high in saturated fat include pizza, cheese, ice cream, whole milk, fried foods, and gravies. Choose foods that are lower in fat and lean sources of protein. Use olive oil when cooking as it contains healthy fats that are not high in saturated fat.  Limit alcohol. Alcohol interferes with the elimination of uric acid from your body. If you are having a gout attack, avoid all alcohol.  Keep in mind that different people's bodies react differently to different foods. You will probably learn over time which foods do or do not affect you. If you discover that a food tends to cause your gout to flare up, avoid eating that food. You can more freely enjoy foods that do not cause problems. If you have any questions about a food item, talk to your dietitian or health care provider. Which foods are low, moderate, and high in purines? The following is a list of foods that are low, moderate, and high in purines. You can eat any amount of the foods that are low in purines.  You may be able to have small amounts of foods that are moderate in purines. Ask your health care provider how much of a food moderate in purines you can have. Avoid foods high in purines. Grains  Foods low in purines: Enriched white bread, pasta, rice, cake, cornbread, popcorn.  Foods moderate in purines: Whole-grain breads and cereals, wheat germ, bran, oatmeal. Uncooked oatmeal. Dry wheat bran or wheat germ.  Foods high in purines: Pancakes, [place] toast, biscuits, muffins. Vegetables  Foods low in purines: All vegetables, except those that are moderate in purines.  Foods moderate in purines: Asparagus, cauliflower, spinach, mushrooms, green peas. Fruits  All fruits are low in purines. Meats and other Protein Foods  Foods low in purines: Eggs, nuts, peanut butter.  Foods moderate in purines: 80-90% lean beef, lamb, veal, pork, poultry, fish, eggs, peanut butter, nuts. Crab, lobster, oysters, and shrimp. Cooked dried beans, peas, and lentils.  Foods high in purines: Anchovies, sardines, herring, mussels, tuna, codfish, scallops, trout, and haddock. Berniece Salines. Organ meats (such as liver or kidney). Tripe. Game meat. Goose. Sweetbreads. Dairy  All dairy foods are low in purines. Low-fat and fat-free dairy products are best because they are low in saturated fat. Beverages  Drinks low in purines: Water, carbonated beverages, tea, coffee, cocoa.  Drinks moderate in purines: Soft drinks and other drinks sweetened with high-fructose corn syrup. Juices. To find whether a food or  drink is sweetened with high-fructose corn syrup, look at the ingredients list.  Drinks high in purines: Alcoholic beverages (such as beer). Condiments  Foods low in purines: Salt, herbs, olives, pickles, relishes, vinegar.  Foods moderate in purines: Butter, margarine, oils, mayonnaise. Fats and Oils  Foods low in purines: All types, except gravies and sauces made with meat.  Foods high in purines: Gravies  and sauces made with meat. Other Foods  Foods low in purines: Sugars, sweets, gelatin. Cake. Soups made without meat.  Foods moderate in purines: Meat-based or fish-based soups, broths, or bouillons. Foods and drinks sweetened with high-fructose corn syrup.  Foods high in purines: High-fat desserts (such as ice cream, cookies, cakes, pies, doughnuts, and chocolate). Contact your dietitian for more information on foods that are not listed here. This information is not intended to replace advice given to you by your health care provider. Make sure you discuss any questions you have with your health care provider. Document Released: 06/26/2010 Document Revised: 08/07/2015 Document Reviewed: 02/05/2013 Elsevier Interactive Patient Education  2017 [employer].

## 2017-04-11 ENCOUNTER — Encounter: Payer: Self-pay | Admitting: Internal Medicine

## 2017-04-11 ENCOUNTER — Encounter: Payer: Self-pay | Admitting: Emergency Medicine

## 2017-04-11 ENCOUNTER — Ambulatory Visit (INDEPENDENT_AMBULATORY_CARE_PROVIDER_SITE_OTHER)
Admission: RE | Admit: 2017-04-11 | Discharge: 2017-04-11 | Disposition: A | Discharge status: Home / Self Care | Payer: Medicare Other | Source: Ambulatory Visit | Attending: Internal Medicine | Admitting: Internal Medicine

## 2017-04-11 ENCOUNTER — Telehealth: Payer: Self-pay | Admitting: Internal Medicine

## 2017-04-11 ENCOUNTER — Other Ambulatory Visit (INDEPENDENT_AMBULATORY_CARE_PROVIDER_SITE_OTHER): Payer: Medicare Other

## 2017-04-11 ENCOUNTER — Ambulatory Visit (INDEPENDENT_AMBULATORY_CARE_PROVIDER_SITE_OTHER): Payer: Medicare Other | Admitting: Internal Medicine

## 2017-04-11 VITALS — BP 162/92 | HR 94 | Temp 98.0°F | Resp 20 | Wt 239.0 lb

## 2017-04-11 DIAGNOSIS — R0602 Shortness of breath: Secondary | ICD-10-CM

## 2017-04-11 DIAGNOSIS — M109 Gout, unspecified: Secondary | ICD-10-CM | POA: Diagnosis not present

## 2017-04-11 DIAGNOSIS — R05 Cough: Secondary | ICD-10-CM | POA: Diagnosis not present

## 2017-04-11 LAB — COMPREHENSIVE METABOLIC PANEL
ALT: 17 U/L (ref 0–35)
AST: 15 U/L (ref 0–37)
Albumin: 3.9 g/dL (ref 3.5–5.2)
Alkaline Phosphatase: 79 U/L (ref 39–117)
BUN: 21 mg/dL (ref 6–23)
CALCIUM: 9.5 mg/dL (ref 8.4–10.5)
CHLORIDE: 102 meq/L (ref 96–112)
CO2: 29 mEq/L (ref 19–32)
Creatinine, Ser: 0.7 mg/dL (ref 0.40–1.20)
GFR: 87.76 mL/min (ref >60.00)
GLUCOSE: 98 mg/dL (ref 70–99)
Potassium: 3.5 mEq/L (ref 3.5–5.1)
Sodium: 142 mEq/L (ref 135–145)
Total Bilirubin: 0.4 mg/dL (ref 0.2–1.2)
Total Protein: 7 g/dL (ref 6.0–8.3)

## 2017-04-11 LAB — BRAIN NATRIURETIC PEPTIDE: PRO B NATRI PEPTIDE: 313 pg/mL — AB (ref 0.0–100.0)

## 2017-04-11 MED ORDER — FUROSEMIDE 20 MG PO TABS: 20.0000 mg | ORAL_TABLET | Freq: Every day | ORAL | 0 refills | Status: DC

## 2017-04-11 NOTE — Assessment & Plan Note (Signed)
Acute on chronic after addition of prednisone (for gout) - it has gotten better over the past couple of days Has seen cardio and cardiac eval last month negative -- chronic SOB thought to be related to COPD, but Dr Ellyn Hack did mention he would consider checking a coronary CT angio to reassess Will check CXR, bnp, cmp to evaluate for fluid overload/chf related to steroids

## 2017-04-11 NOTE — Assessment & Plan Note (Signed)
First episode Improving with steroid taper - she will complete Will monitor uric acid If recurs will add allopurinol Advised revision of diet  -- info given

## 2017-04-11 NOTE — Telephone Encounter (Signed)
Blood work shows normal kidney and liver tests.  There is evidence of fluid overload and that is likely causing her shortness of breath   She would feel better after taking a water pill for three days to get rid of some of the excess fluid  If she agrees send in lasix 20 mg daily x 3 days only.

## 2017-04-11 NOTE — Telephone Encounter (Signed)
Also chest xray is normal

## 2017-04-11 NOTE — Telephone Encounter (Signed)
Spoke with pt to inform. RX sent to [facility]

## 2017-04-15 ENCOUNTER — Telehealth: Payer: Self-pay | Admitting: Cardiology

## 2017-04-15 HISTORY — PX: TRANSTHORACIC ECHOCARDIOGRAM: SHX275

## 2017-04-15 NOTE — Telephone Encounter (Signed)
Returned call to pt she states that her SOB was very bad last week so she went to see her PCP and she told her that the SOB and LE swelling was from prednisone so she gave her #3 lasix 20mg  and she took them as directed and now she is only SOB on exertional this happens with short distances. Every time she gets up to even walk to the bathroom. She was seen in December for this but states that this is different than then.  Pt denies any other sx CP, dizziness, only sx are SOB-exertional.  Denies any LE swelling at this time.   She has appt scheduled with [facility] 04-25-16. I scheduled an appt with app Tuesday 2-5 for evaluation. Verbalizes understanding to ER if needed-

## 2017-04-15 NOTE — Telephone Encounter (Signed)
New message   Pt c/o Shortness Of Breath: STAT if SOB developed within the last 24 hours or pt is noticeably SOB on the phone  1. Are you currently SOB (can you hear that pt is SOB on the phone)? NO  2. How long have you been experiencing SOB? 04/09/17  3. Are you SOB when sitting or when up moving around? When moving around  4. Are you currently experiencing any other symptoms? NO

## 2017-04-25 ENCOUNTER — Ambulatory Visit: Payer: Medicare Other | Admitting: Cardiology

## 2017-04-25 NOTE — Progress Notes (Deleted)
PCP: Binnie Rail, MD  Clinic Note: No chief complaint on file.   HPI: April Combs is a 72 y.o. female with a PMH below who presents today for two-month follow-up for profound dyspnea. [name] is the wife of one of my long-term patients April Combs). She has a history of non-CAD by cath back in a 2015. She was given a diagnosis of emphysema on chest x-ray and has had profound exertional dyspnea.  April Combs was last seen in December 2018. She was doing overall fairly well but still at baseline exertional dyspnea that limited much her activity.  Recent Hospitalizations: ER - Acute Gout of L Foot  Studies Personally Reviewed - (if available, images/films reviewed: From Epic Chart or Care Everywhere)  None  Interval History: ***   No chest pain or shortness of breath with rest or exertion. No PND, orthopnea or edema. No palpitations, lightheadedness, dizziness, weakness or syncope/near syncope. No TIA/amaurosis fugax symptoms. No melena, hematochezia, hematuria, or epstaxis. No claudication.  ROS: A comprehensive was performed. ROS  Respiratory: Positive for shortness of breath (Stable). Negative for cough (Occasional) and wheezing.   Cardiovascular:       Per history of present illness  Gastrointestinal: Negative for blood in stool and nausea.  Genitourinary: Negative for flank pain and hematuria.  Musculoskeletal: Negative for myalgias.  Neurological: Positive for dizziness (Standing up too quickly).  Endo/Heme/Allergies: Positive for environmental allergies.   I have reviewed and (if needed) personally updated the patient's problem list, medications, allergies, past medical and surgical history, social and family history.   Past Medical History:  Diagnosis Date  . Anemia   . Anxiety   . Coronary artery disease (CAD) excluded November 2015   Low Risk Myoview (false positive) with possible inferior-inferolateral ischemia, but with nonsustained VT --> CATH -->  Angiographically Normal Coronary Arteries  . Depression   . GERD (gastroesophageal reflux disease)   . Hyperlipidemia   . Hyperplastic colon polyp   . Hypertension   . NICM (nonischemic cardiomyopathy) ([place])    Essentially resolved. Echocardiogram July 2013 showed normal EF greater than 55%. Important septal motion. Normal LV filling pressures. Mild MR and mild anterior MVP  . Obesity (BMI 30-39.9)   . Osteoarthritis   . Positional vertigo     Past Surgical History:  Procedure Laterality Date  . ABDOMINAL HYSTERECTOMY    . APPENDECTOMY  as child  . BREAST BIOPSY Right    benign  . CHOLECYSTECTOMY    . COLONOSCOPY    . DOPPLER ECHOCARDIOGRAPHY  July 2013   09/20/11. normal Lv thickness and function with EF greater thatn 55%  . Exercise tolerance test - CPET-MET-TEST  July 2013   Submaximal effort. Only 80% of her, therefore peak VO2 of 75% is likely an underestimate. High resting heart rate, achieving 86% of heart rate. --> Unable to interpret due to lack of effort.  [name] GASTRIC BYPASS  1980  . GASTRIC BYPASS    . KIDNEY STONE SURGERY    . LEFT HEART CATHETERIZATION WITH CORONARY ANGIOGRAM N/A 02/01/2014   Procedure: LEFT HEART CATHETERIZATION WITH CORONARY ANGIOGRAM;  Surgeon: Leonie Man, MD;  Location: [facility] CATH LAB;  Service: Cardiovascular: Angiographically normal coronaries  . NM MYOVIEW LTD  November 2015   4:20 min, 4.6 METS --> DOE, but no chest discomfort; Significant + ST -T wave changes noted with NSVT & PVCs. Images suggest Inferior-inferolateral ischemia.  Low Risk. -- FALSE POSITIVE  . Pulmonary Function Tests  July  2013   Increased densities, decreased FVC - consistent with obesity hypoventilation; FEV1 is 58%, FVC 60% of predicted.  [name] UPPER GASTROINTESTINAL ENDOSCOPY      No outpatient medications have been marked as taking for the 04/25/17 encounter (Appointment) with Leonie Man, MD.    No Known Allergies  Social History   Tobacco Use  . Smoking  status: Former Smoker    Packs/day: 1.00    Years: 30.00    Pack years: 30.00    Last attempt to quit: 01/18/2003    Years since quitting: 14.2  . Smokeless tobacco: Never Used  Substance Use Topics  . Alcohol use: Yes    Alcohol/week: 1.8 oz    Types: 3 Cans of beer per week    Comment: drinks 2-3 beers a week  . Drug use: No   Social History   Social History Narrative   She is married to April Combs, also patient of Dr. Ellyn Hack.   Mother of 3, grandmother 74.   Quit smoking in 2005. Social alcohol.   No routine exercise.    family history includes Breast cancer in her maternal aunt; Colon cancer in her maternal aunt, maternal uncle, and other; Diabetes in her daughter; Heart disease in her father, maternal aunt, mother, and paternal grandfather; Kidney disease in her daughter; Lung cancer in her sister; Multiple sclerosis in her daughter; Thyroid disease in her mother.  Wt Readings from Last 3 Encounters:  04/11/17 239 lb (108.4 kg)  04/06/17 234 lb (106.1 kg)  02/18/17 236 lb 6.4 oz (107.2 kg)    PHYSICAL EXAM There were no vitals taken for this visit. Physical Exam  Cardiovascular: Normal rate and normal pulses.  Occasional extrasystoles are present. PMI is not displaced (Unable to palpate). Exam reveals no gallop.  Murmur heard.  Low-pitched blowing holosystolic murmur is present with a grade of 1/6 at the back. Pulmonary/Chest: Effort normal. No respiratory distress. She has no wheezes (No rales or rhonchi).  Distant breath sounds    Adult ECG Report  Rate: *** ;  Rhythm: {rhythm:17366};   Narrative Interpretation: ***   Other studies Reviewed: Additional studies/ records that were reviewed today include:  Recent Labs:   Lab Results  Component Value Date   CHOL 190 12/13/2016   HDL 53.20 12/13/2016   [place] Not reported    LDLDIRECT 93.0 12/13/2016   TRIG 237.0 (H) 12/13/2016   CHOLHDL 4 12/13/2016    ASSESSMENT / PLAN: Problem List Items Addressed  This Visit    None      Current medicines are reviewed at length with the patient today. (+/- concerns) *** The following changes have been made: ***  There are no Patient Instructions on file for this visit.  Studies Ordered:   No orders of the defined types were placed in this encounter.     Glenetta Hew, M.D., M.S. Interventional Cardiologist   Pager # [phone] Phone # [phone] [address]. [place],  [address]   Thank you for choosing [facility] at [facility]!!

## 2017-04-26 ENCOUNTER — Ambulatory Visit (INDEPENDENT_AMBULATORY_CARE_PROVIDER_SITE_OTHER): Payer: Medicare Other | Admitting: Cardiology

## 2017-04-26 ENCOUNTER — Encounter: Payer: Self-pay | Admitting: Cardiology

## 2017-04-26 VITALS — BP 136/88 | HR 96 | Ht 65.0 in | Wt 237.0 lb

## 2017-04-26 DIAGNOSIS — I499 Cardiac arrhythmia, unspecified: Secondary | ICD-10-CM

## 2017-04-26 DIAGNOSIS — I10 Essential (primary) hypertension: Secondary | ICD-10-CM | POA: Diagnosis not present

## 2017-04-26 DIAGNOSIS — E785 Hyperlipidemia, unspecified: Secondary | ICD-10-CM

## 2017-04-26 DIAGNOSIS — R079 Chest pain, unspecified: Secondary | ICD-10-CM | POA: Diagnosis not present

## 2017-04-26 DIAGNOSIS — I110 Hypertensive heart disease with heart failure: Secondary | ICD-10-CM

## 2017-04-26 DIAGNOSIS — I5032 Chronic diastolic (congestive) heart failure: Secondary | ICD-10-CM

## 2017-04-26 DIAGNOSIS — R0602 Shortness of breath: Secondary | ICD-10-CM

## 2017-04-26 DIAGNOSIS — J439 Emphysema, unspecified: Secondary | ICD-10-CM

## 2017-04-26 DIAGNOSIS — I5033 Acute on chronic diastolic (congestive) heart failure: Secondary | ICD-10-CM

## 2017-04-26 HISTORY — DX: Hypertensive heart disease with heart failure: I11.0

## 2017-04-26 MED ORDER — CARVEDILOL 12.5 MG PO TABS: 18.7500 mg | ORAL_TABLET | Freq: Two times a day (BID) | ORAL | 3 refills | Status: DC

## 2017-04-26 NOTE — Progress Notes (Signed)
PCP: Binnie Rail, MD  Clinic Note: Chief Complaint  Patient presents with  . Follow-up  . Shortness of Breath  . Chest Pain    Pressure but not since last Thursday.    HPI: April Combs is a 72 y.o. female with a PMH below who presents today for two-month follow-up for profound dyspnea. April Combs is the wife of one of my long-term patients April Combs). She has a history of non-CAD by cath back in a 2015. She was given a diagnosis of emphysema on chest x-ray and has had profound exertional dyspnea.  April Combs was last seen in December 2018. She was doing overall fairly well but still at baseline exertional dyspnea that limited much her activity.  Recent Hospitalizations: ER - Acute Gout of [place] -- Treated with steroid taper  Studies Personally Reviewed - (if available, images/films reviewed: From Epic Chart or Care Everywhere)  None  Interval History: April Combs returns today notably frustrated with leg edema, orthopnea and worsening dyspnea than she usually has had.  Her typical treatments that she has done for COPD have not helped.  She was started on low-dose Lasix (given 3 days worth by her PCP) and noted that she is breathing much better since doing so.  Her orthopnea and edema has improved but however she still notes about 4 pillow orthopnea with PND.  She has not had any chest pain or pressure with rest or exertion, however she has not felt like doing any exertion.  She has had off-and-on palpitations but not that significant.  She felt a little bit lightheaded off and on, but no syncope or near syncope.  No TIA or amaurosis fugax.  No melena, hematochezia, hematuria, or epstaxis. No claudication.  ROS: A comprehensive was performed.  Pertinent symptoms noted above Review of Systems  Constitutional: Negative for malaise/fatigue.  HENT: Negative for congestion.   Respiratory: Positive for cough (Better after Lasix).   Cardiovascular: Positive for palpitations,  orthopnea, leg swelling and PND.  Gastrointestinal: Positive for heartburn ( definitely got worse with steroids). Negative for blood in stool, constipation and melena.  Genitourinary: Negative for dysuria and hematuria.  Musculoskeletal: Positive for joint pain.       No more gout pain since taking steroids  Neurological: Positive for dizziness (Only she stands up too quickly).  All other systems reviewed and are negative.  I have reviewed and (if needed) personally updated the patient's problem list, medications, allergies, past medical and surgical history, social and family history.   Past Medical History:  Diagnosis Date  . Anemia   . Anxiety   . Coronary artery disease (CAD) excluded November 2015   Low Risk Myoview (false positive) with possible inferior-inferolateral ischemia, but with nonsustained VT --> CATH --> Angiographically Normal Coronary Arteries  . Depression   . GERD (gastroesophageal reflux disease)   . Hyperlipidemia   . Hyperplastic colon polyp   . Hypertension   . NICM (nonischemic cardiomyopathy) ([place])    Essentially resolved. Echocardiogram July 2013 showed normal EF greater than 55%. Important septal motion. Normal LV filling pressures. Mild MR and mild anterior MVP  . Obesity (BMI 30-39.9)   . Osteoarthritis   . Positional vertigo     Past Surgical History:  Procedure Laterality Date  . ABDOMINAL HYSTERECTOMY    . APPENDECTOMY  as child  . BREAST BIOPSY Right    benign  . CHOLECYSTECTOMY    . COLONOSCOPY    . DOPPLER ECHOCARDIOGRAPHY  July 2013  09/20/11. normal Lv thickness and function with EF greater thatn 55%  . Exercise tolerance test - CPET-MET-TEST  July 2013   Submaximal effort. Only 80% of her, therefore peak VO2 of 75% is likely an underestimate. High resting heart rate, achieving 86% of heart rate. --> Unable to interpret due to lack of effort.  April Combs GASTRIC BYPASS  1980  . GASTRIC BYPASS    . KIDNEY STONE SURGERY    . LEFT HEART  CATHETERIZATION WITH CORONARY ANGIOGRAM N/A 02/01/2014   Procedure: LEFT HEART CATHETERIZATION WITH CORONARY ANGIOGRAM;  Surgeon: Leonie Man, MD;  Location: [facility] CATH LAB;  Service: Cardiovascular: Angiographically normal coronaries  . NM MYOVIEW LTD  November 2015   4:20 min, 4.6 METS --> DOE, but no chest discomfort; Significant + ST -T wave changes noted with NSVT & PVCs. Images suggest Inferior-inferolateral ischemia.  Low Risk. -- FALSE POSITIVE  . Pulmonary Function Tests  July 2013   Increased densities, decreased FVC - consistent with obesity hypoventilation; FEV1 is 58%, FVC 60% of predicted.  April Combs UPPER GASTROINTESTINAL ENDOSCOPY      No outpatient medications have been marked as taking for the 04/26/17 encounter (Office Visit) with Leonie Man, MD.    No Known Allergies  Social History   Tobacco Use  . Smoking status: Former Smoker    Packs/day: 1.00    Years: 30.00    Pack years: 30.00    Last attempt to quit: 01/18/2003    Years since quitting: 14.2  . Smokeless tobacco: Never Used  Substance Use Topics  . Alcohol use: Yes    Alcohol/week: 1.8 oz    Types: 3 Cans of beer per week    Comment: drinks 2-3 beers a week  . Drug use: No   Social History   Social History Narrative   She is married to April Combs, also patient of Dr. Ellyn Hack.   Mother of 3, grandmother 9.   Quit smoking in 2005. Social alcohol.   No routine exercise.    family history includes Breast cancer in her maternal aunt; Colon cancer in her maternal aunt, maternal uncle, and other; Diabetes in her daughter; Heart disease in her father, maternal aunt, mother, and paternal grandfather; Kidney disease in her daughter; Lung cancer in her sister; Multiple sclerosis in her daughter; Thyroid disease in her mother.  Wt Readings from Last 3 Encounters:  04/26/17 237 lb (107.5 kg)  04/11/17 239 lb (108.4 kg)  04/06/17 234 lb (106.1 kg)    PHYSICAL EXAM BP 136/88   Pulse 96   Ht 5' 5"  (1.651  m)   Wt 237 lb (107.5 kg)   BMI 39.44 kg/m   Physical Exam  Constitutional: She is oriented to person, place, and time. She appears well-developed and well-nourished.  Well-groomed.  Obese  HENT:  Head: Normocephalic and atraumatic.  Eyes: EOM are normal.  Neck: Hepatojugular reflux and JVD (Mildly elevated) present. Carotid bruit is not present.  Cardiovascular: Normal rate, regular rhythm and normal pulses.  Occasional extrasystoles are present. PMI is not displaced (Unable to palpate). Exam reveals no gallop and no friction rub.  Murmur heard.  Holosystolic murmur is present with a grade of 1/6 at the upper left sternal border radiating to the back. Borderline tachycardic  Pulmonary/Chest: Effort normal. No respiratory distress. She exhibits no tenderness.  Mildly decreased breath sounds in both bases.  No obvious rales or rhonchi.  She did have some interstitial sounds that clear with cough. --  Abdominal: Soft. Bowel sounds are normal. She exhibits no distension. There is no tenderness.  Musculoskeletal: Normal range of motion. She exhibits edema (1-2+ knee down edema).  Neurological: She is alert and oriented to person, place, and time.  Psychiatric: She has a normal mood and affect. Her behavior is normal. Judgment and thought content normal.  Nursing note and vitals reviewed.    Adult ECG Report None   Other studies Reviewed: Additional studies/ records that were reviewed today include:  Recent Labs:   Lab Results  Component Value Date   CHOL 190 12/13/2016   HDL 53.20 12/13/2016   [place] Not reported    LDLDIRECT 93.0 12/13/2016   TRIG 237.0 (H) 12/13/2016   CHOLHDL 4 12/13/2016    ASSESSMENT / PLAN: Problem List Items Addressed This Visit    Chest pain on exertion (Chronic)    She had some chest pain recentlyRight at the height of her dyspnea.  I suspect this is probably endocardial ischemia from volume overload.  If she has higher than normal blood  pressures, or more dyspnea, she can continue to take her Lasix, but also can take a dose of Imdur (from her husband April Combs)      Essential hypertension (Chronic)    Borderline hypertension today with heart rate 96.  Will increase carvedilol dose and continue current dose of ACE inhibitor-HCTZ.      Relevant Medications   carvedilol (COREG) 12.5 MG tablet   Other Relevant Orders   ECHOCARDIOGRAM COMPLETE   Hyperlipidemia with target LDL less than 100 (Chronic)    Last labs look relatively good.  Maintain current dose of statin.  Discussed dietary adjustments for triglycerides.      Relevant Medications   carvedilol (COREG) 12.5 MG tablet   Hypertensive heart disease with acute on chronic diastolic congestive heart failure ([place]) - Primary    I think what happened is that she got volume overloaded in setting of steroid taper.  She now has hypertensive heart disease and heart failure.  We will ramp up her diuretic but also increase her blood pressure control/rate control. Plan: Check 2D echo just to ensure that there is no reduced EF or wall motion normality.  Increase carvedilol to check 2D echo just to ensure that there is no reduced ejection fraction 18.75 mg twice daily. She will take Lasix 20 mg daily until she gets her way down to what she says is her home weight of roughly 234 235 pounds.  She will then use sliding scale Lasix as described below.   If you gain more than 3 pounds from dry weight: Increase the Lasix dosing to 20 mg in the morning  until weight returns to baseline dry weight.  If weight gain is greater than 5 pounds in 2 days: Increased to Lasix 20 mg twice a day and contact the office for further assistance if weight does not go down the next day.  If the weight goes down more than 3 pounds from dry weight: Hold Lasix until it returns to baseline dry weight       Relevant Medications   carvedilol (COREG) 12.5 MG tablet   Other Relevant Orders   EKG 12-Lead  (Completed)   ECHOCARDIOGRAM COMPLETE   Irregular heart beat (Chronic)   Relevant Orders   EKG 12-Lead (Completed)   Morbid obesity (HCC) (Chronic)   Pulmonary emphysema (HCC) (Chronic)    Stable for now.  Continue care per PCP.      SOB (shortness of breath) (  Chronic)    Agree that she had acute on chronic diastolic heart failure secondary to prednisone taper.  At this time I think this is clearly the etiology for dyspnea and therefore will recommend aggressive diuresis.  Need to be careful not to stimulate another gout flare. In the future if she is going to be on a  prednisone taper, she will probably need additional diuretic.      Relevant Orders   ECHOCARDIOGRAM COMPLETE    Other Visit Diagnoses    Acute on chronic diastolic heart failure (HCC)       Relevant Medications   carvedilol (COREG) 12.5 MG tablet   Other Relevant Orders   ECHOCARDIOGRAM COMPLETE      Current medicines are reviewed at length with the patient today. (+/- concerns) still short of breath, edematous with weight gain The following changes have been made:See below  Patient Instructions  MEDICATION INSTRUCTIONS  INCREASE CARVEDILOL TO 18.75 MG ( 1 AND 1/2 TABLETS)TWICE A DAY  Sliding scale Lasix: Weigh yourself when you get home, then Daily in the Morning. Your dry weight will be what your scale says on the day you return home.(here is  235 lbs.).   If you gain more than 3 pounds from dry weight: Increase the Lasix dosing to 20 mg in the morning  until weight returns to baseline dry weight.  If weight gain is greater than 5 pounds in 2 days: Increased to Lasix 20 mg twice a day and contact the office for further assistance if weight does not go down the next day.  If the weight goes down more than 3 pounds from dry weight: Hold Lasix until it returns to baseline dry weight   IF YOUR BLOOD PRESSURE  BECOMES ELEVATED TAKE BORROW IMDUR  TABLET TO TAKE FROM HUSBAND.    [facility] echocardi[facility]. Echocardiography is a painless test that uses sound waves to create images of your heart. It provides your doctor with information about the size and shape of your heart and how well your heart's chambers and valves are working. This procedure takes approximately one hour. There are no restrictions for this procedure.    Your physician recommends that you schedule a follow-up appointment in Aug 10 2017 [place].    Studies Ordered:   Orders Placed This Encounter  Procedures  . EKG 12-Lead  . ECHOCARDIOGRAM COMPLETE      Glenetta Hew, M.D., M.S. Interventional Cardiologist   Pager # [phone] Phone # [phone] [address] [place], [place] [address]   Thank you for choosing [facility] at [facility]!!

## 2017-04-26 NOTE — Patient Instructions (Addendum)
MEDICATION INSTRUCTIONS  INCREASE CARVEDILOL TO 18.75 MG ( 1 AND 1/2 TABLETS)TWICE A DAY  Sliding scale Lasix: Weigh yourself when you get home, then Daily in the Morning. Your dry weight will be what your scale says on the day you return home.(here is  235 lbs.).   If you gain more than 3 pounds from dry weight: Increase the Lasix dosing to 20 mg in the morning  until weight returns to baseline dry weight.  If weight gain is greater than 5 pounds in 2 days: Increased to Lasix 20 mg twice a day and contact the office for further assistance if weight does not go down the next day.  If the weight goes down more than 3 pounds from dry weight: Hold Lasix until it returns to baseline dry weight   IF YOUR BLOOD PRESSURE  BECOMES ELEVATED TAKE BORROW IMDUR  TABLET TO TAKE FROM HUSBAND.    [facility] [place] has requested that you have an echocardiogram. Echocardiography is a painless test that uses sound waves to create images of your heart. It provides your doctor with information about the size and shape of your heart and how well your heart's chambers and valves are working. This procedure takes approximately one hour. There are no restrictions for this procedure.    Your physician recommends that you schedule a follow-up appointment in Aug 10 2017 [place].

## 2017-04-27 ENCOUNTER — Other Ambulatory Visit: Payer: Self-pay | Admitting: Internal Medicine

## 2017-04-28 ENCOUNTER — Encounter: Payer: Self-pay | Admitting: Cardiology

## 2017-04-28 NOTE — Assessment & Plan Note (Signed)
Stable for now.  Continue care per PCP.

## 2017-04-28 NOTE — Assessment & Plan Note (Signed)
Last labs look relatively good.  Maintain current dose of statin.  Discussed dietary adjustments for triglycerides.

## 2017-04-28 NOTE — Assessment & Plan Note (Signed)
Borderline hypertension today with heart rate 96.  Will increase carvedilol dose and continue current dose of ACE inhibitor-HCTZ.

## 2017-04-28 NOTE — Assessment & Plan Note (Signed)
>>  ASSESSMENT AND PLAN FOR PULMONARY EMPHYSEMA (HCC) WRITTEN ON 04/28/2017 11:29 PM BY HARDING, ALM ORN, MD  Stable for now.  Continue care per PCP.

## 2017-04-28 NOTE — Assessment & Plan Note (Signed)
Agree that she had acute on chronic diastolic heart failure secondary to prednisone taper.  At this time I think this is clearly the etiology for dyspnea and therefore will recommend aggressive diuresis.  Need to be careful not to stimulate another gout flare. In the future if she is going to be on a  prednisone taper, she will probably need additional diuretic.

## 2017-04-28 NOTE — Assessment & Plan Note (Signed)
She had some chest pain recentlyRight at the height of her dyspnea.  I suspect this is probably endocardial ischemia from volume overload.  If she has higher than normal blood pressures, or more dyspnea, she can continue to take her Lasix, but also can take a dose of Imdur (from her husband [name]

## 2017-04-28 NOTE — Assessment & Plan Note (Addendum)
I think what happened is that she got volume overloaded in setting of steroid taper.  She now has hypertensive heart disease and heart failure.  We will ramp up her diuretic but also increase her blood pressure control/rate control. Plan: Check 2D echo just to ensure that there is no reduced EF or wall motion normality.  Increase carvedilol to check 2D echo just to ensure that there is no reduced ejection fraction 18.75 mg twice daily. She will take Lasix 20 mg daily until she gets her way down to what she says is her home weight of roughly 234 235 pounds.  She will then use sliding scale Lasix as described below.   If you gain more than 3 pounds from dry weight: Increase the Lasix dosing to 20 mg in the morning  until weight returns to baseline dry weight.  If weight gain is greater than 5 pounds in 2 days: Increased to Lasix 20 mg twice a day and contact the office for further assistance if weight does not go down the next day.  If the weight goes down more than 3 pounds from dry weight: Hold Lasix until it returns to baseline dry weight

## 2017-05-04 ENCOUNTER — Ambulatory Visit (HOSPITAL_COMMUNITY): Payer: Medicare Other | Attending: Cardiology

## 2017-05-04 ENCOUNTER — Other Ambulatory Visit: Payer: Self-pay

## 2017-05-04 ENCOUNTER — Other Ambulatory Visit: Payer: Self-pay | Admitting: Internal Medicine

## 2017-05-04 ENCOUNTER — Encounter: Payer: Self-pay | Admitting: Internal Medicine

## 2017-05-04 DIAGNOSIS — I5033 Acute on chronic diastolic (congestive) heart failure: Secondary | ICD-10-CM | POA: Insufficient documentation

## 2017-05-04 DIAGNOSIS — I10 Essential (primary) hypertension: Secondary | ICD-10-CM

## 2017-05-04 DIAGNOSIS — I110 Hypertensive heart disease with heart failure: Secondary | ICD-10-CM | POA: Insufficient documentation

## 2017-05-04 DIAGNOSIS — I340 Nonrheumatic mitral (valve) insufficiency: Secondary | ICD-10-CM | POA: Insufficient documentation

## 2017-05-04 DIAGNOSIS — R0602 Shortness of breath: Secondary | ICD-10-CM | POA: Diagnosis not present

## 2017-05-04 MED ORDER — PREDNISONE 10 MG (21) PO TBPK: ORAL_TABLET | ORAL | 0 refills | Status: DC

## 2017-05-04 MED ORDER — ALLOPURINOL 100 MG PO TABS
100.0000 mg | ORAL_TABLET | Freq: Every day | ORAL | 6 refills | Status: DC
Start: 2017-05-04 — End: 2017-08-10

## 2017-05-09 ENCOUNTER — Other Ambulatory Visit: Payer: Self-pay | Admitting: Internal Medicine

## 2017-05-09 DIAGNOSIS — Z1231 Encounter for screening mammogram for malignant neoplasm of breast: Secondary | ICD-10-CM

## 2017-05-27 ENCOUNTER — Ambulatory Visit (INDEPENDENT_AMBULATORY_CARE_PROVIDER_SITE_OTHER): Payer: Medicare Other | Admitting: Cardiology

## 2017-05-27 ENCOUNTER — Encounter: Payer: Self-pay | Admitting: Cardiology

## 2017-05-27 VITALS — BP 138/78 | HR 75 | Ht 65.0 in | Wt 238.2 lb

## 2017-05-27 DIAGNOSIS — R0602 Shortness of breath: Secondary | ICD-10-CM

## 2017-05-27 DIAGNOSIS — I110 Hypertensive heart disease with heart failure: Secondary | ICD-10-CM | POA: Diagnosis not present

## 2017-05-27 DIAGNOSIS — E785 Hyperlipidemia, unspecified: Secondary | ICD-10-CM

## 2017-05-27 DIAGNOSIS — R9439 Abnormal result of other cardiovascular function study: Secondary | ICD-10-CM

## 2017-05-27 DIAGNOSIS — I5033 Acute on chronic diastolic (congestive) heart failure: Secondary | ICD-10-CM | POA: Diagnosis not present

## 2017-05-27 DIAGNOSIS — J439 Emphysema, unspecified: Secondary | ICD-10-CM | POA: Diagnosis not present

## 2017-05-27 DIAGNOSIS — R079 Chest pain, unspecified: Secondary | ICD-10-CM

## 2017-05-27 DIAGNOSIS — I10 Essential (primary) hypertension: Secondary | ICD-10-CM

## 2017-05-27 MED ORDER — FUROSEMIDE 20 MG PO TABS: ORAL_TABLET | ORAL | 4 refills | Status: DC

## 2017-05-27 NOTE — Progress Notes (Signed)
PCP: Binnie Rail, MD  Clinic Note: Chief Complaint  Patient presents with  . Follow-up    Edema, orthopnea,--> post echo  . Shortness of Breath    Somewhat stable, but with orthopnea and DOE  . Edema    Somewhat improved    HPI: April Combs is a 72 y.o. 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  April Combs is the wife of one of my long-term patients April Combs). 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  She was given a diagnosis of emphysema on chest x-ray and has had profound exertional dyspnea.  April Combs was last seen in mid February after she was treated for gout flare with steroids.  She had significant leg edema as well as orthopnea and dyspnea.  I treated her with increased dose of IV Lasix as she now had 4 pillow orthopnea.  Suggestion was that she probably end up being volume overloaded after starting prednisone taper.  I increased her carvedilol dose up to 18.75 twice daily, check 2D echocardiogram, and had her take standing daily dose of Lasix. -->  Unfortunately, apparently she never received a prescription for Lasix, and therefore has not been taking it.  Recent Hospitalizations: None  Studies Personally Reviewed - (if available, images/films reviewed: From Epic Chart or Care Everywhere)  2D Echo May 04, 2017: Mildly reduced EF of 45%.  No regional wall motion normality.  GR 1 DD.  Interval History: April Combs returns today overall feeling better.  She did not know that she was supposed to keep taking the Lasix, because the prescription never came.  She still has 3 pillow orthopnea and edema, but the edema is notably improved.  Swelling does get better when she puts her feet up.  She denies any PND.   She has intermittent episodes of chest tightness and fullness, but no real pain when she tries to do any  activity or walking.  Most notably she has exertional dyspnea.  No resting dyspnea or chest discomfort.  She does feel more palpitations than usual though, this is been present for the last few weeks.  She denies any syncope or near syncope symptoms, but has still had some lightheaded headed spells.  No TIA or amaurosis fugax. No claudication.  Cardiovascular ROS: positive for - dyspnea on exertion, edema, orthopnea, palpitations, shortness of breath and exertional chest tightness negative for - loss of consciousness, murmur, paroxysmal nocturnal dyspnea, rapid heart rate or syncope/near syncope or TIA / Amaurosis fugax   ROS: A comprehensive was performed.  Pertinent symptoms noted above Review of Systems  Constitutional: Negative for malaise/fatigue.  HENT: Negative for congestion.   Respiratory: Positive for cough (Better after Lasix) and shortness of breath.   Cardiovascular:       Per HPI  Gastrointestinal: Negative for blood in stool, constipation, heartburn ( definitely got worse with steroids) and melena.  Genitourinary: Negative for dysuria and hematuria.  Musculoskeletal: Positive for joint pain (1 more episode of Gout).  Neurological: Positive for dizziness (Only she stands up too quickly).  Psychiatric/Behavioral: The patient is nervous/anxious.   All other systems reviewed and are negative.  I have reviewed and (if needed) personally updated the patient's problem list, medications, allergies, past medical and surgical history, social and family history.   Past Medical History:  Diagnosis Date  . Anemia   .  Anxiety   . Coronary artery disease (CAD) excluded November 2015   Low Risk Myoview (false positive) with possible inferior-inferolateral ischemia, but with nonsustained VT --> CATH --> Angiographically Normal Coronary Arteries  . Depression   . GERD (gastroesophageal reflux disease)   . Hyperlipidemia   . Hyperplastic colon polyp   . Hypertension   . NICM  (nonischemic cardiomyopathy) ([place])    Essentially resolved. Echocardiogram July 2013 showed normal EF greater than 55%. Important septal motion. Normal LV filling pressures. Mild MR and mild anterior MVP  . Obesity (BMI 30-39.9)   . Osteoarthritis   . Positional vertigo     Past Surgical History:  Procedure Laterality Date  . ABDOMINAL HYSTERECTOMY    . APPENDECTOMY  as child  . BREAST BIOPSY Right    benign  . CHOLECYSTECTOMY    . COLONOSCOPY    . DOPPLER ECHOCARDIOGRAPHY  July 2013   09/20/11. normal Lv thickness and function with EF greater thatn 55%  . Exercise tolerance test - CPET-MET-TEST  July 2013   Submaximal effort. Only 80% of her, therefore peak VO2 of 75% is likely an underestimate. High resting heart rate, achieving 86% of heart rate. --> Unable to interpret due to lack of effort.  [name] GASTRIC BYPASS  1980  . GASTRIC BYPASS    . KIDNEY STONE SURGERY    . LEFT HEART CATHETERIZATION WITH CORONARY ANGIOGRAM N/A 02/01/2014   Procedure: LEFT HEART CATHETERIZATION WITH CORONARY ANGIOGRAM;  Surgeon: Leonie Man, MD;  Location: [facility] CATH LAB;  Service: Cardiovascular: Angiographically normal coronaries  . NM MYOVIEW LTD  November 2015   4:20 min, 4.6 METS --> DOE, but no chest discomfort; Significant + ST -T wave changes noted with NSVT & PVCs. Images suggest Inferior-inferolateral ischemia.  Low Risk. -- FALSE POSITIVE  . Pulmonary Function Tests  July 2013   Increased densities, decreased FVC - consistent with obesity hypoventilation; FEV1 is 58%, FVC 60% of predicted.  . TRANSTHORACIC ECHOCARDIOGRAM  04/2017   Mildly reduced EF of 45%.  No regional wall motion normality.  GR 1 DD  . UPPER GASTROINTESTINAL ENDOSCOPY      Current Meds  Medication Sig  . allopurinol (ZYLOPRIM) 100 MG tablet Take 1 tablet (100 mg total) by mouth daily.  [name] atorvastatin (LIPITOR) 20 MG tablet Take 1 tablet (20 mg total) by mouth daily.  . carvedilol (COREG) 12.5 MG tablet Take 1.5 tablets  (18.75 mg total) by mouth 2 (two) times daily with a meal.  . lisinopril-hydrochlorothiazide (PRINZIDE,ZESTORETIC) 20-25 MG tablet Take 0.5 tablets by mouth daily.  . ranitidine (ZANTAC) 300 MG tablet Take 1 tablet (300 mg total) by mouth at bedtime.    No Known Allergies  Social History   Tobacco Use  . Smoking status: Former Smoker    Packs/day: 1.00    Years: 30.00    Pack years: 30.00    Last attempt to quit: 01/18/2003    Years since quitting: 14.3  . Smokeless tobacco: Never Used  Substance Use Topics  . Alcohol use: Yes    Alcohol/week: 1.8 oz    Types: 3 Cans of beer per week    Comment: drinks 2-3 beers a week  . Drug use: No   Social History   Social History Narrative   She is married to [name], also patient of Dr. Ellyn Hack.   Mother of 3, grandmother 39.   Quit smoking in 2005. Social alcohol.   No routine exercise.    family history  includes Breast cancer in her maternal aunt; Colon cancer in her maternal aunt, maternal uncle, and other; Diabetes in her daughter; Heart disease in her father, maternal aunt, mother, and paternal grandfather; Kidney disease in her daughter; Lung cancer in her sister; Multiple sclerosis in her daughter; Thyroid disease in her mother.  Wt Readings from Last 3 Encounters:  05/27/17 238 lb 3.2 oz (108 kg)  04/26/17 237 lb (107.5 kg)  04/11/17 239 lb (108.4 kg)    PHYSICAL EXAM BP 138/78   Pulse 75   Ht 5' 5"  (1.651 m)   Wt 238 lb 3.2 oz (108 kg)   BMI 39.64 kg/m  Physical Exam  Constitutional: She is oriented to person, place, and time. She appears well-developed and well-nourished.  Well-groomed.  Obese  HENT:  Head: Normocephalic and atraumatic.  Eyes: EOM are normal.  Neck: Hepatojugular reflux present. No JVD (~8 cm H20) present. Carotid bruit is not present.  Cardiovascular: Normal rate, regular rhythm and normal pulses.  Occasional extrasystoles are present. PMI is not displaced (Unable to palpate). Exam reveals no  gallop and no friction rub.  Murmur heard.  Holosystolic murmur is present with a grade of 1/6 at the upper left sternal border radiating to the back. Borderline tachycardic  Pulmonary/Chest: Effort normal. No respiratory distress. She exhibits no tenderness.  Mildly decreased breath sounds in both bases.  No obvious rales or rhonchi.  She did have some interstitial sounds that clear with cough. --   Abdominal: Soft. Bowel sounds are normal. She exhibits no distension. There is no tenderness.  Musculoskeletal: Normal range of motion. She exhibits edema (trace-1+ BLE).  Neurological: She is alert and oriented to person, place, and time.  Psychiatric: She has a normal mood and affect. Her behavior is normal. Judgment and thought content normal.  Nursing note and vitals reviewed.    Adult ECG Report  Rate: 75 ;  Rhythm: normal sinus rhythm and Normal axis, intervals and durations;   Narrative Interpretation: Normal EKG   Other studies Reviewed: Additional studies/ records that were reviewed today include:  Recent Labs:   Lab Results  Component Value Date   CHOL 190 12/13/2016   HDL 53.20 12/13/2016   [place] Not reported    LDLDIRECT 93.0 12/13/2016   TRIG 237.0 (H) 12/13/2016   CHOLHDL 4 12/13/2016   Lab Results  Component Value Date   CREATININE 0.70 04/11/2017   BUN 21 04/11/2017   NA 142 04/11/2017   K 3.5 04/11/2017   CL 102 04/11/2017   CO2 29 04/11/2017   ASSESSMENT / PLAN: Problem List Items Addressed This Visit    SOB (shortness of breath) (Chronic)    Probably related to combination of issues including obesity and deconditioning, but I think she did have an acute on chronic heart failure exacerbation secondary to prednisone.  That seems to resolved and her volume level seems to be stabilized.  But she still has significant orthopnea with edema.  Her echocardiogram also showed a reduced EF.  Plan: We will check a coronary CT angiogram to exclude significant CAD. We  will start standing dose of furosemide 20 minutes daily over the weekend and then every other day plus additional dose as directed.      Relevant Orders   EKG 12-Lead (Completed)   CT CORONARY MORPH W/CTA COR W/SCORE W/CA W/CM &/OR WO/CM   CT CORONARY FRACTIONAL FLOW RESERVE DATA PREP   CT CORONARY FRACTIONAL FLOW RESERVE FLUID ANALYSIS   Pulmonary emphysema ([place]) (Chronic)  Seems to be stabilized.  Monitored by PCP.  Certainly plays a role in her baseline dyspnea      Hypertensive heart disease with acute on chronic diastolic congestive heart failure (HCC) - Primary (Chronic)    Volume status seems to have stabilized after short course of diuretic, however she still has some JVD and HJR as well as edema.  I think she probably more on being on a standing dose of diuretic.  We will therefore start Lasix 20 mg daily for the first week and then every other day with additional doses PRN.  She is on carvedilol and ACE inhibitor. With reduced EF now on echo which there is a combination of systolic and diastolic dysfunction.  Need to exclude ischemic CAD. Plan: Coronary CTA   With CT FFR.      Relevant Medications   furosemide (LASIX) 20 MG tablet   Other Relevant Orders   EKG 12-Lead (Completed)   CT CORONARY MORPH W/CTA COR W/SCORE W/CA W/CM &/OR WO/CM   CT CORONARY FRACTIONAL FLOW RESERVE DATA PREP   CT CORONARY FRACTIONAL FLOW RESERVE FLUID ANALYSIS   Hyperlipidemia with target LDL less than 100 (Chronic)    Currently based on her risk factors, labs look relatively stable.  She is on moderate dose atorvastatin.  This is being followed by her PCP.  We will continue to monitor.      Relevant Medications   furosemide (LASIX) 20 MG tablet   FALSE POSITIVE NUCLEAR STRESS CHEST (Chronic)    She is had a history of falsely abnormal nuclear stress test.  Now with an abnormal echocardiogram suggesting reduced ejection fraction, I would like to exclude ischemic CAD.  Option now will be to  check coronary CT angiogram which will allow [place] to see anatomy but also potentially evaluate physiology with a CT FFR.      Essential hypertension (Chronic)    Borderline pressures today.  I increased her carvedilol and that seems to have helped.  Continue current meds as directed until we see the results of her CTA.  Would likely titrate Coreg further to 25 mg twice daily.      Relevant Medications   furosemide (LASIX) 20 MG tablet   Other Relevant Orders   EKG 12-Lead (Completed)   CT CORONARY MORPH W/CTA COR W/SCORE W/CA W/CM &/OR WO/CM   CT CORONARY FRACTIONAL FLOW RESERVE DATA PREP   CT CORONARY FRACTIONAL FLOW RESERVE FLUID ANALYSIS   Basic metabolic panel   Chest pain on exertion (Chronic)    Chest tightness and pressure with exertion associated with dyspnea, this in the setting of having an reduced EF on echo is somewhat concerning for coronary ischemia.  She has had a negative cardiac catheterization in the past in the setting of false positive nuclear stress test. Plan: Check coronary CTA with CT FFR.      Relevant Orders   EKG 12-Lead (Completed)   CT CORONARY MORPH W/CTA COR W/SCORE W/CA W/CM &/OR WO/CM   CT CORONARY FRACTIONAL FLOW RESERVE DATA PREP   CT CORONARY FRACTIONAL FLOW RESERVE FLUID ANALYSIS   Basic metabolic panel      Current medicines are reviewed at length with the patient today. (+/- concerns) still short of breath, edematous with weight gain The following changes have been made:See below  Patient Instructions  Medication Instructions: START Furosemide 20 mg- Take furosemide 20 mg daily through the weekend, then for one week take a 20 mg tablet every other day, after that take 20  mg as needed.  If you need a refill on your cardiac medications before your next appointment, please call your pharmacy.   Procedures/Testing: Your physician has requested that you have cardiac CT. Cardiac computed tomography (CT) is a painless test that uses an x-ray  machine to take clear, detailed pictures of your heart. For further information please visit [facility]. Please follow instruction sheet as given.  Studies Ordered:   Orders Placed This Encounter  Procedures  . CT CORONARY MORPH W/CTA COR W/SCORE W/CA W/CM &/OR WO/CM  . CT CORONARY FRACTIONAL FLOW RESERVE DATA PREP  . CT CORONARY FRACTIONAL FLOW RESERVE FLUID ANALYSIS  . Basic metabolic panel  . EKG 12-Lead   Unless hers coronary CT angiogram is highly abnormal, she will follow-up as originally scheduled in May June timeframe with her husband.   Glenetta Hew, M.D., M.S. Interventional Cardiologist   Pager # [phone] Phone # [phone] [address] [place], [place] [address]   Thank you for choosing Heartcare at [facility]!!

## 2017-05-27 NOTE — Patient Instructions (Addendum)
Medication Instructions: START Furosemide 20 mg- Take furosemide 20 mg daily through the weekend, then for one week take a 20 mg tablet every other day, after that take 20 mg as needed.  If you need a refill on your cardiac medications before your next appointment, please call your pharmacy.   Procedures/Testing: Your physician has requested that you have cardiac CT. Cardiac computed tomography (CT) is a painless test that uses an x-ray machine to take clear, detailed pictures of your heart. For further information please visit [facility]tn. Please follow instruction sheet as given.   Thank you for choosing Heartcare at [facility]!!       Please arrive at the [facility] main entrance of [facility] at           AM (30-45 minutes prior to test start time)  [facility] [address] [place], [place] [address] [phone]  Proceed to the [facility] Radiology Department (First Floor).  Please follow these instructions carefully (unless otherwise directed):   On the Night Before the Test: . Drink plenty of water. . Do not consume any caffeinated/decaffeinated beverages or chocolate 12 hours prior to your test. . Do not take any antihistamines 12 hours prior to your test. . If you take Metformin do not take 24 hours prior to test.  On the Day of the Test: . Drink plenty of water. Do not drink any water within one hour of the test. . Do not eat any food 4 hours prior to the test. . You may take your regular medications prior to the test. . IF NOT ON A BETA BLOCKER - Take 50 mg of lopressor (metoprolol) one hour before the test. . HOLD Furosemide morning of the test.  After the Test: . Drink plenty of water. . After receiving IV contrast, you may experience a mild flushed feeling. This is normal. . On occasion, you may experience a mild rash up to 24 hours after the test. This is not dangerous. If this occurs, you can take Benadryl 25 mg and increase  your fluid intake. . If you experience trouble breathing, this can be serious. If it is severe call 911 IMMEDIATELY. If it is mild, please call our office. . If you take any of these medications: Glipizide/Metformin, Avandament, Glucavance, please do not take 48 hours after completing test.

## 2017-05-30 ENCOUNTER — Encounter: Payer: Self-pay | Admitting: Cardiology

## 2017-05-30 NOTE — Assessment & Plan Note (Signed)
Currently based on her risk factors, labs look relatively stable.  She is on moderate dose atorvastatin.  This is being followed by her PCP.  We will continue to monitor.

## 2017-05-30 NOTE — Assessment & Plan Note (Addendum)
Seems to be stabilized.  Monitored by PCP.  Certainly plays a role in her baseline dyspnea

## 2017-05-30 NOTE — Assessment & Plan Note (Signed)
Volume status seems to have stabilized after short course of diuretic, however she still has some JVD and HJR as well as edema.  I think she probably more on being on a standing dose of diuretic.  We will therefore start Lasix 20 mg daily for the first week and then every other day with additional doses PRN.  She is on carvedilol and ACE inhibitor. With reduced EF now on echo which there is a combination of systolic and diastolic dysfunction.  Need to exclude ischemic CAD. Plan: Coronary CTA   With CT FFR.

## 2017-05-30 NOTE — Assessment & Plan Note (Signed)
Chest tightness and pressure with exertion associated with dyspnea, this in the setting of having an reduced EF on echo is somewhat concerning for coronary ischemia.  She has had a negative cardiac catheterization in the past in the setting of false positive nuclear stress test. Plan: Check coronary CTA with CT FFR.

## 2017-05-30 NOTE — Assessment & Plan Note (Signed)
She is had a history of falsely abnormal nuclear stress test.  Now with an abnormal echocardiogram suggesting reduced ejection fraction, I would like to exclude ischemic CAD.  Option now will be to check coronary CT angiogram which will allow [place] to see anatomy but also potentially evaluate physiology with a CT FFR.

## 2017-05-30 NOTE — Assessment & Plan Note (Signed)
Borderline pressures today.  I increased her carvedilol and that seems to have helped.  Continue current meds as directed until we see the results of her CTA.  Would likely titrate Coreg further to 25 mg twice daily.

## 2017-05-30 NOTE — Assessment & Plan Note (Signed)
>>  ASSESSMENT AND PLAN FOR PULMONARY EMPHYSEMA (HCC) WRITTEN ON 05/30/2017 12:26 PM BY HARDING, ALM ORN, MD  Seems to be stabilized.  Monitored by PCP.  Certainly plays a role in her baseline dyspnea

## 2017-05-30 NOTE — Assessment & Plan Note (Signed)
Probably related to combination of issues including obesity and deconditioning, but I think she did have an acute on chronic heart failure exacerbation secondary to prednisone.  That seems to resolved and her volume level seems to be stabilized.  But she still has significant orthopnea with edema.  Her echocardiogram also showed a reduced EF.  Plan: We will check a coronary CT angiogram to exclude significant CAD. We will start standing dose of furosemide 20 minutes daily over the weekend and then every other day plus additional dose as directed.

## 2017-06-01 ENCOUNTER — Encounter: Payer: Self-pay | Admitting: Internal Medicine

## 2017-06-10 ENCOUNTER — Ambulatory Visit
Admission: RE | Admit: 2017-06-10 | Discharge: 2017-06-10 | Disposition: A | Discharge status: Home / Self Care | Payer: Medicare Other | Source: Ambulatory Visit | Attending: Internal Medicine | Admitting: Internal Medicine

## 2017-06-10 DIAGNOSIS — Z1231 Encounter for screening mammogram for malignant neoplasm of breast: Secondary | ICD-10-CM | POA: Diagnosis not present

## 2017-07-04 ENCOUNTER — Other Ambulatory Visit: Payer: Self-pay

## 2017-07-04 DIAGNOSIS — R079 Chest pain, unspecified: Secondary | ICD-10-CM

## 2017-07-04 DIAGNOSIS — I10 Essential (primary) hypertension: Secondary | ICD-10-CM

## 2017-07-04 LAB — BASIC METABOLIC PANEL
BUN/Creatinine Ratio: 33 — ABNORMAL HIGH (ref 12–28)
BUN: 20 mg/dL (ref 8–27)
CALCIUM: 9.4 mg/dL (ref 8.7–10.3)
CHLORIDE: 102 mmol/L (ref 96–106)
CO2: 25 mmol/L (ref 20–29)
Creatinine, Ser: 0.6 mg/dL (ref 0.57–1.00)
GFR calc Af Amer: 107 mL/min/{1.73_m2} (ref >59)
GFR calc non Af Amer: 93 mL/min/{1.73_m2} (ref >59)
GLUCOSE: 106 mg/dL — AB (ref 65–99)
POTASSIUM: 4.7 mmol/L (ref 3.5–5.2)
Sodium: 143 mmol/L (ref 134–144)

## 2017-07-13 ENCOUNTER — Ambulatory Visit (HOSPITAL_COMMUNITY): Payer: Medicare Other

## 2017-07-13 ENCOUNTER — Ambulatory Visit (HOSPITAL_COMMUNITY)
Admission: RE | Admit: 2017-07-13 | Discharge: 2017-07-13 | Disposition: A | Discharge status: Home / Self Care | Payer: Medicare Other | Source: Ambulatory Visit | Attending: Cardiology | Admitting: Cardiology

## 2017-07-13 DIAGNOSIS — I2510 Atherosclerotic heart disease of native coronary artery without angina pectoris: Secondary | ICD-10-CM | POA: Diagnosis not present

## 2017-07-13 DIAGNOSIS — R0602 Shortness of breath: Secondary | ICD-10-CM | POA: Insufficient documentation

## 2017-07-13 DIAGNOSIS — I5033 Acute on chronic diastolic (congestive) heart failure: Secondary | ICD-10-CM | POA: Diagnosis not present

## 2017-07-13 DIAGNOSIS — R079 Chest pain, unspecified: Secondary | ICD-10-CM | POA: Insufficient documentation

## 2017-07-13 DIAGNOSIS — I110 Hypertensive heart disease with heart failure: Secondary | ICD-10-CM | POA: Diagnosis not present

## 2017-07-13 HISTORY — PX: OTHER SURGICAL HISTORY: SHX169

## 2017-07-13 MED ORDER — NITROGLYCERIN 0.4 MG SL SUBL
SUBLINGUAL_TABLET | SUBLINGUAL | Status: AC
  Filled 2017-07-13: qty 2

## 2017-07-13 MED ORDER — NITROGLYCERIN 0.4 MG SL SUBL
0.8000 mg | SUBLINGUAL_TABLET | SUBLINGUAL | Status: DC | PRN
  Administered 2017-07-13: 0.8 mg via SUBLINGUAL
  Filled 2017-07-13: qty 25

## 2017-07-13 MED ORDER — METOPROLOL TARTRATE 5 MG/5ML IV SOLN
5.0000 mg | INTRAVENOUS | Status: DC | PRN
  Administered 2017-07-13 (×3): 5 mg via INTRAVENOUS
  Filled 2017-07-13: qty 5

## 2017-07-13 MED ORDER — METOPROLOL TARTRATE 5 MG/5ML IV SOLN
INTRAVENOUS | Status: AC
  Administered 2017-07-13: 5 mg
  Filled 2017-07-13: qty 20

## 2017-07-13 MED ORDER — IOPAMIDOL (ISOVUE-370) INJECTION 76%
INTRAVENOUS | Status: AC
  Administered 2017-07-13: 100 mL
  Filled 2017-07-13: qty 100

## 2017-07-17 ENCOUNTER — Encounter: Payer: Self-pay | Admitting: Cardiology

## 2017-08-08 NOTE — Progress Notes (Signed)
Subjective:    Patient ID: April Combs, female    DOB: [birth year], 72 y.o.   MRN: [id number]  HPI The patient is here for follow up.  Hypertension: She is taking her medication daily. She is not compliant with a low sodium diet.  She denies chest pain and regular headaches. She is not exercising regularly.     Edema: she takes the lasix about 1-2 times a week.    Hyperlipidemia: She is taking her medication daily. She is not compliant with a low fat/cholesterol diet. She is not exercising regularly. She denies myalgias.   GERD:  She is taking her medication as needed only.  She denies any GERD symptoms and feels her GERD is well controlled.   Gout:  She is taking allopurinol daily.  She did have some gout symptoms since being on allopurinol.  She denies any symptoms of gout since march.   Hyperglycemia:  She is not compliant with a low carb diet.  She is not able to exercise.   Mod - severe COPD: she continues to have SOB with exertion.  She thinks it has gotten a little worse.  She has not tried an inhaler and does not want to try one.  She has some dry cough and other people say she has wheezing, but she has not heard it.    Medications and allergies reviewed with patient and updated if appropriate.  Patient Active Problem List   Diagnosis Date Noted  . Hyperglycemia 08/10/2017  . 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 Gout 04/10/2017  . Elevated TSH 02/09/2017  . Morbid obesity ([place]) 10/15/2015  . Dyspnea and respiratory abnormality 04/29/2015  . Pulmonary emphysema ([place]) 04/29/2015  . BRBPR (bright red blood per rectum) 02/05/2015  . Lumbar radiculopathy 12/24/2014  . Hx of tobacco use, presenting hazards to health 09/29/2014  . Nail fungus 03/11/2014  . FALSE POSITIVE NUCLEAR STRESS CHEST 01/28/2014  . Chest pain on exertion 01/14/2014  . GERD (gastroesophageal reflux disease) 05/15/2012  .  Positional vertigo 12/22/2011  . Irregular heart beat 09/29/2011  . Fungal dermatitis 09/29/2011  . Hyperlipidemia with target LDL less than 100 09/29/2011  . SOB (shortness of breath) 08/23/2011  . HYPERTRIGLYCERIDEMIA 07/12/2008  . Essential hypertension 05/28/2008  . OSTEOARTHRITIS 05/28/2008    Current Outpatient Medications on File Prior to Visit  Medication Sig Dispense Refill  . atorvastatin (LIPITOR) 20 MG tablet Take 1 tablet (20 mg total) by mouth daily. 90 tablet 3  . carvedilol (COREG) 12.5 MG tablet Take 1.5 tablets (18.75 mg total) by mouth 2 (two) times daily with a meal. 270 tablet 3  . furosemide (LASIX) 20 MG tablet Take furosemide 20 mg daily through the weekend, then for one week take a 20 mg tablet every other day, after that take 20 mg as needed. 25 tablet 4  . lisinopril-hydrochlorothiazide (PRINZIDE,ZESTORETIC) 20-25 MG tablet Take 0.5 tablets by mouth daily. 45 tablet 3  . ranitidine (ZANTAC) 300 MG tablet Take 1 tablet (300 mg total) by mouth at bedtime. 90 tablet 3   No current facility-administered medications on file prior to visit.     Past Medical History:  Diagnosis Date  . Anemia   . Anxiety   . Coronary artery disease (CAD) excluded November 2015   Low Risk Myoview (false positive) with possible inferior-inferolateral ischemia, but with nonsustained VT --> CATH --> Angiographically Normal Coronary Arteries  . Depression   .  GERD (gastroesophageal reflux disease)   . Hyperlipidemia   . Hyperplastic colon polyp   . Hypertension   . NICM (nonischemic cardiomyopathy) ([place])    Essentially resolved. Echocardiogram July 2013 showed normal EF greater than 55%. Important septal motion. Normal LV filling pressures. Mild MR and mild anterior MVP  . Obesity (BMI 30-39.9)   . Osteoarthritis   . Positional vertigo     Past Surgical History:  Procedure Laterality Date  . ABDOMINAL HYSTERECTOMY    . APPENDECTOMY  as child  . BREAST BIOPSY Right    benign    . CHOLECYSTECTOMY    . COLONOSCOPY    . DOPPLER ECHOCARDIOGRAPHY  July 2013   09/20/11. normal Lv thickness and function with EF greater thatn 55%  . Exercise tolerance test - CPET-MET-TEST  July 2013   Submaximal effort. Only 80% of her, therefore peak VO2 of 75% is likely an underestimate. High resting heart rate, achieving 86% of heart rate. --> Unable to interpret due to lack of effort.  [name] GASTRIC BYPASS  1980  . GASTRIC BYPASS    . KIDNEY STONE SURGERY    . LEFT HEART CATHETERIZATION WITH CORONARY ANGIOGRAM N/A 02/01/2014   Procedure: LEFT HEART CATHETERIZATION WITH CORONARY ANGIOGRAM;  Surgeon: Leonie Man, MD;  Location: [facility] CATH LAB;  Service: Cardiovascular: Angiographically normal coronaries  . NM MYOVIEW LTD  November 2015   4:20 min, 4.6 METS --> DOE, but no chest discomfort; Significant + ST -T wave changes noted with NSVT & PVCs. Images suggest Inferior-inferolateral ischemia.  Low Risk. -- FALSE POSITIVE  . Pulmonary Function Tests  July 2013   Increased densities, decreased FVC - consistent with obesity hypoventilation; FEV1 is 58%, FVC 60% of predicted.  . TRANSTHORACIC ECHOCARDIOGRAM  04/2017   Mildly reduced EF of 45%.  No regional wall motion normality.  GR 1 DD  . UPPER GASTROINTESTINAL ENDOSCOPY      Social History   Socioeconomic History  . Marital status: Married    Spouse name: Not on file  . Number of children: 3  . Years of education: Not on file  . Highest education level: Not on file  Occupational History  . Occupation: Retired  Scientific laboratory technician  . Financial resource strain: Not on file  . Food insecurity:    Worry: Not on file    Inability: Not on file  . Transportation needs:    Medical: Not on file    Non-medical: Not on file  Tobacco Use  . Smoking status: Former Smoker    Packs/day: 1.00    Years: 30.00    Pack years: 30.00    Last attempt to quit: 01/18/2003    Years since quitting: 14.5  . Smokeless tobacco: Never Used  Substance and  Sexual Activity  . Alcohol use: Yes    Alcohol/week: 1.8 oz    Types: 3 Cans of beer per week    Comment: drinks 2-3 beers a week  . Drug use: No  . Sexual activity: Not on file  Lifestyle  . Physical activity:    Days per week: Not on file    Minutes per session: Not on file  . Stress: Not on file  Relationships  . Social connections:    Talks on phone: Not on file    Gets together: Not on file    Attends religious service: Not on file    Active member of club or organization: Not on file    Attends meetings of clubs  or organizations: Not on file    Relationship status: Not on file  Other Topics Concern  . Not on file  Social History Narrative   She is married to [name], also patient of Dr. Ellyn Hack.   Mother of 3, grandmother 67.   Quit smoking in 2005. Social alcohol.   No routine exercise.    Family History  Problem Relation Age of Onset  . Kidney disease Daughter   . Multiple sclerosis Daughter   . Heart disease Father   . Heart disease Mother   . Thyroid disease Mother   . Heart disease Paternal Grandfather   . Colon cancer Other        early 43's.  Genetic testing from maternal side of family.  . Colon cancer Maternal Aunt   . Breast cancer Maternal Aunt   . Lung cancer Sister        smoker  . Diabetes Sister   . Colon cancer Maternal Uncle   . Diabetes Daughter        x3  . Heart disease Maternal Aunt   . Colon polyps Neg Hx   . Esophageal cancer Neg Hx   . Gallbladder disease Neg Hx   . Rectal cancer Neg Hx   . Stomach cancer Neg Hx     Review of Systems  Constitutional: Negative for chills and fever.  Respiratory: Positive for cough (dry ), shortness of breath (with exertion) and wheezing (other people hear it, she denies it).   Cardiovascular: Positive for palpitations and leg swelling. Negative for chest pain.  Neurological: Negative for light-headedness and headaches.       Objective:   Vitals:   08/10/17 0829  BP: 138/88  Pulse: 81   Resp: 16  Temp: 97.7 F (36.5 C)  SpO2: 97%   BP Readings from Last 3 Encounters:  08/10/17 138/88  07/13/17 137/71  05/27/17 138/78   Wt Readings from Last 3 Encounters:  08/10/17 241 lb (109.3 kg)  05/27/17 238 lb 3.2 oz (108 kg)  04/26/17 237 lb (107.5 kg)   Body mass index is 40.1 kg/m.   Physical Exam    Constitutional: Appears well-developed and well-nourished. No distress.  HENT:  Head: Normocephalic and atraumatic.  Neck: Neck supple. No tracheal deviation present. No thyromegaly present.  No cervical lymphadenopathy Cardiovascular: Normal rate, regular rhythm and normal heart sounds.   No murmur heard. No carotid bruit .  No edema Pulmonary/Chest: Effort normal and breath sounds normal. No respiratory distress. No has no wheezes. No rales.  Skin: Skin is warm and dry. Not diaphoretic.  Psychiatric: Normal mood and affect. Behavior is normal.      Assessment & Plan:    See Problem List for Assessment and Plan of chronic medical problems.

## 2017-08-08 NOTE — Patient Instructions (Addendum)
  Test(s) ordered today. Your results will be released to [place] (or called to you) after review, usually within 72hours after test completion. If any changes need to be made, you will be notified at that same time.    Medications reviewed and updated.  Changes include increasing the allopurinol to 200 mg daily.   Your prescription(s) have been submitted to your pharmacy. Please take as directed and contact our office if you believe you are having problem(s) with the medication(s).   Please followup in 6 months

## 2017-08-10 ENCOUNTER — Ambulatory Visit: Payer: Medicare Other | Admitting: Internal Medicine

## 2017-08-10 ENCOUNTER — Other Ambulatory Visit (INDEPENDENT_AMBULATORY_CARE_PROVIDER_SITE_OTHER): Payer: Medicare Other

## 2017-08-10 ENCOUNTER — Encounter: Payer: Self-pay | Admitting: Cardiology

## 2017-08-10 ENCOUNTER — Ambulatory Visit (INDEPENDENT_AMBULATORY_CARE_PROVIDER_SITE_OTHER): Payer: Medicare Other | Admitting: Internal Medicine

## 2017-08-10 ENCOUNTER — Encounter: Payer: Self-pay | Admitting: Internal Medicine

## 2017-08-10 ENCOUNTER — Ambulatory Visit (INDEPENDENT_AMBULATORY_CARE_PROVIDER_SITE_OTHER): Payer: Medicare Other | Admitting: Cardiology

## 2017-08-10 VITALS — BP 146/76 | HR 80 | Ht 65.0 in | Wt 242.4 lb

## 2017-08-10 VITALS — BP 138/88 | HR 81 | Temp 97.7°F | Resp 16 | Wt 241.0 lb

## 2017-08-10 DIAGNOSIS — I10 Essential (primary) hypertension: Secondary | ICD-10-CM

## 2017-08-10 DIAGNOSIS — R0602 Shortness of breath: Secondary | ICD-10-CM | POA: Diagnosis not present

## 2017-08-10 DIAGNOSIS — E785 Hyperlipidemia, unspecified: Secondary | ICD-10-CM

## 2017-08-10 DIAGNOSIS — M109 Gout, unspecified: Secondary | ICD-10-CM

## 2017-08-10 DIAGNOSIS — R739 Hyperglycemia, unspecified: Secondary | ICD-10-CM

## 2017-08-10 DIAGNOSIS — R7989 Other specified abnormal findings of blood chemistry: Secondary | ICD-10-CM

## 2017-08-10 DIAGNOSIS — I5032 Chronic diastolic (congestive) heart failure: Secondary | ICD-10-CM | POA: Diagnosis not present

## 2017-08-10 DIAGNOSIS — I499 Cardiac arrhythmia, unspecified: Secondary | ICD-10-CM | POA: Diagnosis not present

## 2017-08-10 DIAGNOSIS — R7303 Prediabetes: Secondary | ICD-10-CM | POA: Insufficient documentation

## 2017-08-10 DIAGNOSIS — J439 Emphysema, unspecified: Secondary | ICD-10-CM | POA: Diagnosis not present

## 2017-08-10 DIAGNOSIS — N1831 Chronic kidney disease, stage 3a: Secondary | ICD-10-CM | POA: Insufficient documentation

## 2017-08-10 DIAGNOSIS — I110 Hypertensive heart disease with heart failure: Secondary | ICD-10-CM

## 2017-08-10 DIAGNOSIS — K219 Gastro-esophageal reflux disease without esophagitis: Secondary | ICD-10-CM | POA: Diagnosis not present

## 2017-08-10 DIAGNOSIS — E119 Type 2 diabetes mellitus without complications: Secondary | ICD-10-CM | POA: Insufficient documentation

## 2017-08-10 HISTORY — DX: Prediabetes: R73.03

## 2017-08-10 LAB — LIPID PANEL
CHOL/HDL RATIO: 3
Cholesterol: 173 mg/dL (ref 0–200)
HDL: 57.6 mg/dL (ref >39.00)
NONHDL: 115.54
TRIGLYCERIDES: 208 mg/dL — AB (ref 0.0–149.0)
VLDL: 41.6 mg/dL — AB (ref 0.0–40.0)

## 2017-08-10 LAB — COMPREHENSIVE METABOLIC PANEL
ALK PHOS: 60 U/L (ref 39–117)
ALT: 11 U/L (ref 0–35)
AST: 17 U/L (ref 0–37)
Albumin: 4.1 g/dL (ref 3.5–5.2)
BUN: 21 mg/dL (ref 6–23)
CHLORIDE: 104 meq/L (ref 96–112)
CO2: 29 mEq/L (ref 19–32)
Calcium: 9.4 mg/dL (ref 8.4–10.5)
Creatinine, Ser: 0.75 mg/dL (ref 0.40–1.20)
GFR: 80.97 mL/min (ref >60.00)
GLUCOSE: 111 mg/dL — AB (ref 70–99)
POTASSIUM: 4.4 meq/L (ref 3.5–5.1)
Sodium: 141 mEq/L (ref 135–145)
Total Bilirubin: 0.4 mg/dL (ref 0.2–1.2)
Total Protein: 7.1 g/dL (ref 6.0–8.3)

## 2017-08-10 LAB — HEMOGLOBIN A1C: Hgb A1c MFr Bld: 6.1 % (ref 4.6–6.5)

## 2017-08-10 LAB — LDL CHOLESTEROL, DIRECT: Direct LDL: 83 mg/dL

## 2017-08-10 LAB — TSH: TSH: 5.59 u[IU]/mL — ABNORMAL HIGH (ref 0.35–4.50)

## 2017-08-10 LAB — URIC ACID: Uric Acid, Serum: 5.9 mg/dL (ref 2.4–7.0)

## 2017-08-10 MED ORDER — LISINOPRIL-HYDROCHLOROTHIAZIDE 20-25 MG PO TABS: 1.0000 | ORAL_TABLET | Freq: Every day | ORAL | 3 refills | Status: DC

## 2017-08-10 MED ORDER — ALLOPURINOL 100 MG PO TABS: 200.0000 mg | ORAL_TABLET | Freq: Every day | ORAL | 11 refills | Status: DC

## 2017-08-10 MED ORDER — NITROGLYCERIN 0.4 MG SL SUBL: 0.4000 mg | SUBLINGUAL_TABLET | SUBLINGUAL | 6 refills | Status: DC | PRN

## 2017-08-10 NOTE — Assessment & Plan Note (Addendum)
Taking allopurinol daily  recurrent gout symptoms even after starting allopurinol Increase allopurinol to 200 mg daily Will check uric acid level

## 2017-08-10 NOTE — Assessment & Plan Note (Signed)
Check lipid panel  Continue daily statin Regular exercise and healthy diet encouraged  

## 2017-08-10 NOTE — Assessment & Plan Note (Signed)
>>  ASSESSMENT AND PLAN FOR PULMONARY EMPHYSEMA (HCC) WRITTEN ON 08/10/2017  9:07 AM BY BURNS, GLADE PARAS, MD  DOE - worse per patient She has seen pulmonary in past She declines trying an inhaler

## 2017-08-10 NOTE — Assessment & Plan Note (Signed)
tsh

## 2017-08-10 NOTE — Assessment & Plan Note (Signed)
Sugar elevated Check a1c Low sugar / carb diet Stressed regular exercise, weight loss

## 2017-08-10 NOTE — Patient Instructions (Addendum)
MEDICATION INSTRUCTIONS  INCREASE LISNIOPRIL-HCTZ 20/25 MG - TAKE 1 TABLET DAILY  MAY USE NTG ( NITROGLYCERIN 0.4 MG ) SUBLINGUAL IF NEEDED.    Your physician wants you to follow-up in [place] 2019 WITH DR [place]. You will receive a reminder letter in the mail two months in advance. If you don't receive a letter, please call our office to schedule the follow-up appointment.

## 2017-08-10 NOTE — Assessment & Plan Note (Signed)
GERD controlled Continue medication prn only

## 2017-08-10 NOTE — Progress Notes (Signed)
PCP: Binnie Rail, MD  Clinic Note: Chief Complaint  Patient presents with  . Follow-up  . Shortness of Breath    HPI: April Combs is a 72 y.o. 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  April Combs is the wife of one of my long-term patients [name]. 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  She was given a diagnosis of emphysema on chest x-ray and has had profound exertional dyspnea.  April Combs was last seen in mid February after she was treated for gout flare with steroids.  She had significant leg edema as well as orthopnea and dyspnea.  I treated her with increased dose of IV Lasix as she now had 4 pillow orthopnea.  Suggestion was that she probably end up being volume overloaded after starting prednisone taper.  I increased her carvedilol dose up to 18.75 twice daily, check 2D echocardiogram, and had her take standing daily dose of Lasix. -->  Unfortunately, apparently she never received a prescription for Lasix, and therefore has not been taking it.  Recent Hospitalizations: None  Studies Personally Reviewed - (if available, images/films reviewed: From Epic Chart or Care Everywhere)  Coronary Calcium Score/CT Angiogram 07/13/2017: Calcium score 40.  Mild LAD stenosis, but no other significant disease.  Interval History: April Combs returns today again still noticing significant dyspnea and fatigue. She still has exertional dyspnea and is not able to do much of anything.  Sh does not seem to be able to tolerate taking Lasix.  She is only taking it may be couple days a week as needed because of cramping issues.  She really did not notice that it helped her symptomatology all that much. She just has persistent orthopnea, but now minimal edema.  No real PND.  She has exertional dyspnea but no real chest discomfort.  No  resting symptoms.  She seems to have reduced energy level with lack of activity -  notably deconditioned. She has noted some lightheadedness on occasion, but otherwise no real syncope or near syncope.  No TIA or amaurosis fugax. She really does not have any notable palpitations either. No claudication.   Cardiovascular ROS: positive for - dyspnea on exertion, edema, orthopnea, palpitations, shortness of breath and exertional chest tightness negative for - loss of consciousness, murmur, paroxysmal nocturnal dyspnea, rapid heart rate or syncope/near syncope or TIA / Amaurosis fugax   ROS: A comprehensive was performed.  Pertinent symptoms noted above Review of Systems  Constitutional: Negative for malaise/fatigue and weight loss.  HENT: Negative for congestion.   Respiratory: Positive for cough (Remains intermittent) and shortness of breath.   Cardiovascular:       Per HPI  Gastrointestinal: Negative for blood in stool, constipation, heartburn ( definitely got worse with steroids) and melena.  Genitourinary: Negative for dysuria and hematuria.  Musculoskeletal: Positive for joint pain (1 more episode of Gout).  Neurological: Positive for dizziness (Only she stands up too quickly).  Psychiatric/Behavioral: The patient is nervous/anxious.   All other systems reviewed and are negative.  I have reviewed and (if needed) personally updated the patient's problem list, medications, allergies, past medical and surgical history, social and family history.   Past Medical History:  Diagnosis Date  . Anemia   . Anxiety   . Coronary artery disease (CAD) excluded 01/2014   Low Risk Myoview (false  positive) with possible inferior-inferolateral ischemia, but with nonsustained VT --> CATH --> Angiographically Normal Coronary Arteries; coronary CT angiogram showed mild approximately disease but otherwise no obstructive disease.  Coronary calcium score 40.  . Depression   . GERD (gastroesophageal reflux  disease)   . Hyperlipidemia   . Hyperplastic colon polyp   . Hypertension   . NICM (nonischemic cardiomyopathy) ([place])    Essentially resolved. Echocardiogram July 2013 showed normal EF greater than 55%. Important septal motion. Normal LV filling pressures. Mild MR and mild anterior MVP  . Obesity (BMI 30-39.9)   . Osteoarthritis   . Positional vertigo     Past Surgical History:  Procedure Laterality Date  . ABDOMINAL HYSTERECTOMY    . APPENDECTOMY  as child  . BREAST BIOPSY Right    benign  . CHOLECYSTECTOMY    . COLONOSCOPY    . CORONARY CALCIUM SCORE/CT ANGIOGRAM  07/2017   Calcium score 40.  Mild LAD stenosis, but no other significant disease.  April Combs ECHOCARDIOGRAPHY  July 2013   09/20/11. normal Lv thickness and function with EF greater thatn 55%  . Exercise tolerance test - CPET-MET-TEST  July 2013   Submaximal effort. Only 80% of her, therefore peak VO2 of 75% is likely an underestimate. High resting heart rate, achieving 86% of heart rate. --> Unable to interpret due to lack of effort.  [name] GASTRIC BYPASS  1980  . GASTRIC BYPASS    . KIDNEY STONE SURGERY    . LEFT HEART CATHETERIZATION WITH CORONARY ANGIOGRAM N/A 02/01/2014   Procedure: LEFT HEART CATHETERIZATION WITH CORONARY ANGIOGRAM;  Surgeon: Leonie Man, MD;  Location: [facility] CATH LAB;  Service: Cardiovascular: Angiographically normal coronaries  . NM MYOVIEW LTD  November 2015   4:20 min, 4.6 METS --> DOE, but no chest discomfort; Significant + ST -T wave changes noted with NSVT & PVCs. Images suggest Inferior-inferolateral ischemia.  Low Risk. -- FALSE POSITIVE  . Pulmonary Function Tests  July 2013   Increased densities, decreased FVC - consistent with obesity hypoventilation; FEV1 is 58%, FVC 60% of predicted.  . TRANSTHORACIC ECHOCARDIOGRAM  04/2017   Mildly reduced EF of 45%.  No regional wall motion normality.  GR 1 DD  . UPPER GASTROINTESTINAL ENDOSCOPY      Current Meds  Medication Sig  . allopurinol  (ZYLOPRIM) 100 MG tablet Take 2 tablets (200 mg total) by mouth daily.  [name] atorvastatin (LIPITOR) 20 MG tablet Take 1 tablet (20 mg total) by mouth daily.  . carvedilol (COREG) 12.5 MG tablet Take 1.5 tablets (18.75 mg total) by mouth 2 (two) times daily with a meal.  . furosemide (LASIX) 20 MG tablet Take furosemide 20 mg daily through the weekend, then for one week take a 20 mg tablet every other day, after that take 20 mg as needed.  [name] lisinopril-hydrochlorothiazide (PRINZIDE,ZESTORETIC) 20-25 MG tablet Take 1 tablet by mouth daily.  . ranitidine (ZANTAC) 300 MG tablet Take 1 tablet (300 mg total) by mouth at bedtime.  . [DISCONTINUED] lisinopril-hydrochlorothiazide (PRINZIDE,ZESTORETIC) 20-25 MG tablet Take 0.5 tablets by mouth daily.    No Known Allergies  Social History   Tobacco Use  . Smoking status: Former Smoker    Packs/day: 1.00    Years: 30.00    Pack years: 30.00    Last attempt to quit: 01/18/2003    Years since quitting: 14.5  . Smokeless tobacco: Never Used  Substance Use Topics  . Alcohol use: Yes    Alcohol/week: 1.8 oz  Types: 3 Cans of beer per week    Comment: drinks 2-3 beers a week  . Drug use: No   Social History   Social History Narrative   She is married to [name], also patient of Dr. Ellyn Hack.   Mother of 3, grandmother 26.   Quit smoking in 2005. Social alcohol.   No routine exercise.    family history includes Breast cancer in her maternal aunt; Colon cancer in her maternal aunt, maternal uncle, and other; Diabetes in her daughter and sister; Heart disease in her father, maternal aunt, mother, and paternal grandfather; Kidney disease in her daughter; Lung cancer in her sister; Multiple sclerosis in her daughter; Thyroid disease in her mother.  Wt Readings from Last 3 Encounters:  08/10/17 242 lb 6.4 oz (110 kg)  08/10/17 241 lb (109.3 kg)  05/27/17 238 lb 3.2 oz (108 kg)    PHYSICAL EXAM BP (!) 146/76   Pulse 80   Ht _0  (1.651 m)    Wt 242 lb 6.4 oz (110 kg)   SpO2 94%   BMI 40.34 kg/m  Physical Exam  Constitutional: She is oriented to person, place, and time. She appears well-developed and well-nourished.  Well-groomed.  Obese  HENT:  Head: Normocephalic and atraumatic.  Eyes: EOM are normal.  Neck: Hepatojugular reflux present. No JVD (~8 cm H20) present. Carotid bruit is not present.  Cardiovascular: Normal rate, regular rhythm and normal pulses.  Occasional extrasystoles are present. PMI is not displaced (Unable to palpate). Exam reveals no gallop and no friction rub.  Murmur heard.  Holosystolic murmur is present with a grade of 1/6 at the upper left sternal border radiating to the back. Borderline tachycardic  Pulmonary/Chest: Effort normal. No respiratory distress. She exhibits no tenderness.  Mildly decreased breath sounds in both bases.  No obvious rales or rhonchi. Mild diffuse interstitial sounds  Abdominal: Soft. Bowel sounds are normal. She exhibits no distension. There is no tenderness.  Musculoskeletal: Normal range of motion. She exhibits edema (trace-1+ BLE).  Neurological: She is alert and oriented to person, place, and time.  Psychiatric: She has a normal mood and affect. Her behavior is normal. Judgment and thought content normal.  Nursing note and vitals reviewed.    Adult ECG Report  Rate: 75 ;  Rhythm: normal sinus rhythm and Normal axis, intervals and durations;   Narrative Interpretation: Normal EKG   Other studies Reviewed: Additional studies/ records that were reviewed today include:  Recent Labs:   Component Value Date   CHOL 190 12/13/2016   HDL 53.20 12/13/2016   [place] Not reported    LDLDIRECT 93.0 12/13/2016   TRIG 237.0 (H) 12/13/2016   CHOLHDL 4 12/13/2016   Lab Results  Component Value Date   CREATININE 0.75 08/10/2017   BUN 21 08/10/2017   NA 141 08/10/2017   K 4.4 08/10/2017   CL 104 08/10/2017   CO2 29 08/10/2017   ASSESSMENT / PLAN: Problem List Items  Addressed This Visit    SOB (shortness of breath) (Chronic)    Relatively normal coronary CT angiogram would argue against obstructive CAD.  Echocardiogram has not shown any significant reduced function or regional wall motion normality.      Morbid obesity ([place]) (Chronic)    In addition to COPD and some diastolic dysfunction, this is certainly contributing to her exertional dyspnea.  The sedentary nature of her life is certainly contributing to worsening as opposed to improving symptoms. Stressed the importance of adjusting diet  and going to cardiac rehab or some other type of exercise for long-standing maintenance.      Irregular heart beat (Chronic)    Stable now on current dose of beta-blocker      Hypertensive 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 ([place]) - Primary (Chronic)    She seems to have relatively stable volume status and did not really do well with Lasix.  We will simply have her use it as needed.  Will increase afterload reduction by increasing To full dose ACE inhibitor-HCTZ (hopefully this will compensate for lack of Lasix).  Although no obstructive coronary disease noted, she could have microvascular disease and may benefit from nitrate or Ranexa.  Will provide subungual nitroglycerin. Otherwise continue current dose of carvedilol which could also potentially be titrated further however I am reluctant to do so due to her fatigue).      Relevant Medications   lisinopril-hydrochlorothiazide (PRINZIDE,ZESTORETIC) 20-25 MG tablet   nitroGLYCERIN (NITROSTAT) 0.4 MG SL tablet   Hyperlipidemia with target LDL less than 100 (Chronic)    Elevated triglycerides on most recent labs with Lipitor 20 mg daily.  Has been followed by PCP.  May potentially require additional therapy, but would wait on follow-up check.      Relevant Medications   lisinopril-hydrochlorothiazide (PRINZIDE,ZESTORETIC) 20-25 MG tablet   nitroGLYCERIN (NITROSTAT) 0.4 MG SL tablet    Essential hypertension (Chronic)    Still borderline control on carvedilol and lisinopril-HCTZ.  Will increase back to full dose  of the lisinopril-HCTZ (increasing from half tab to full tab).      Relevant Medications   lisinopril-hydrochlorothiazide (PRINZIDE,ZESTORETIC) 20-25 MG tablet   nitroGLYCERIN (NITROSTAT) 0.4 MG SL tablet      Current medicines are reviewed at length with the patient today. (+/- concerns) still short of breath, edematous with weight gain The following changes have been made:See below  Patient Instructions  MEDICATION INSTRUCTIONS  INCREASE LISNIOPRIL-HCTZ 20/25 MG - TAKE 1 TABLET DAILY  MAY USE NTG ( NITROGLYCERIN 0.4 MG ) SUBLINGUAL IF NEEDED.    Your physician wants you to follow-up in [place] 2019 WITH DR Lydia. You will receive a reminder letter in the mail two months in advance. If you don't receive a letter, please call our office to schedule the follow-up appointment.   Studies Ordered:   No orders of the defined types were placed in this encounter.  Unless hers coronary CT angiogram is highly abnormal, she will follow-up as originally scheduled in May June timeframe with her husband.   Glenetta Hew, M.D., M.S. Interventional Cardiologist   Pager # [phone] Phone # [phone] [address]. [place], [place] [address]   Thank you for choosing [facility] at [facility]!!

## 2017-08-10 NOTE — Assessment & Plan Note (Addendum)
BP Readings from Last 3 Encounters:  08/10/17 138/88  07/13/17 137/71  05/27/17 138/78   BP well controlled Stressed decreasing her sodium intake Current regimen effective and well tolerated Continue current medications at current doses Cmp

## 2017-08-10 NOTE — Assessment & Plan Note (Signed)
DOE - worse per patient She has seen pulmonary in past She declines trying an inhaler

## 2017-08-13 ENCOUNTER — Encounter: Payer: Self-pay | Admitting: Cardiology

## 2017-08-13 NOTE — Assessment & Plan Note (Signed)
Relatively normal coronary CT angiogram would argue against obstructive CAD.  Echocardiogram has not shown any significant reduced function or regional wall motion normality.

## 2017-08-13 NOTE — Assessment & Plan Note (Signed)
She seems to have relatively stable volume status and did not really do well with Lasix.  We will simply have her use it as needed.  Will increase afterload reduction by increasing To full dose ACE inhibitor-HCTZ (hopefully this will compensate for lack of Lasix).  Although no obstructive coronary disease noted, she could have microvascular disease and may benefit from nitrate or Ranexa.  Will provide subungual nitroglycerin. Otherwise continue current dose of carvedilol which could also potentially be titrated further however I am reluctant to do so due to her fatigue).

## 2017-08-13 NOTE — Assessment & Plan Note (Signed)
Still borderline control on carvedilol and lisinopril-HCTZ.  Will increase back to full dose  of the lisinopril-HCTZ (increasing from half tab to full tab).

## 2017-08-13 NOTE — Assessment & Plan Note (Signed)
In addition to COPD and some diastolic dysfunction, this is certainly contributing to her exertional dyspnea.  The sedentary nature of her life is certainly contributing to worsening as opposed to improving symptoms. Stressed the importance of adjusting diet and going to cardiac rehab or some other type of exercise for long-standing maintenance.

## 2017-08-13 NOTE — Assessment & Plan Note (Signed)
Elevated triglycerides on most recent labs with Lipitor 20 mg daily.  Has been followed by PCP.  May potentially require additional therapy, but would wait on follow-up check.

## 2017-08-13 NOTE — Assessment & Plan Note (Signed)
Stable now on current dose of beta-blocker

## 2017-08-14 ENCOUNTER — Encounter: Payer: Self-pay | Admitting: Internal Medicine

## 2017-08-30 ENCOUNTER — Telehealth: Payer: Self-pay | Admitting: Internal Medicine

## 2017-08-30 ENCOUNTER — Telehealth: Payer: Self-pay | Admitting: Emergency Medicine

## 2017-08-30 NOTE — Telephone Encounter (Signed)
Copied from [place] [phone]. Topic: Referral - Medical Records >> Aug 30, 2017  2:21 PM Nils Flack, [name] wrote: Reason for CRM: brandi from [facility] called - she needs to have chest xrays, faxed to them.  Fax is [phone] A fax was sent as well.  Please push images through power share. Otherwise please mail cd in mail  [place], [place] [address] Trinda Pascal

## 2017-08-30 NOTE — Telephone Encounter (Signed)
Copied from [place] [phone]. Topic: Referral - Medical Records >> Aug 30, 2017  2:21 PM Nils Flack, [name] wrote: Reason for CRM: brandi from [facility] called - she needs to have chest xrays, faxed to them.  Fax is [phone] A fax was sent as well.  Please push images through power share. Otherwise please mail cd in mail  [place], [place] [address] Trinda Pascal      >> Aug 30, 2017  3:20 PM Para Skeans A wrote: Do you know anything about this. It could of been sent to medical records?

## 2017-08-30 NOTE — Telephone Encounter (Signed)
We do not have the ability to do this. Please send to medical records.

## 2017-09-19 DIAGNOSIS — Z6841 Body Mass Index (BMI) 40.0 and over, adult: Secondary | ICD-10-CM | POA: Diagnosis not present

## 2017-09-19 DIAGNOSIS — J439 Emphysema, unspecified: Secondary | ICD-10-CM | POA: Diagnosis not present

## 2017-09-19 DIAGNOSIS — J449 Chronic obstructive pulmonary disease, unspecified: Secondary | ICD-10-CM | POA: Diagnosis not present

## 2017-09-19 DIAGNOSIS — R0609 Other forms of dyspnea: Secondary | ICD-10-CM | POA: Diagnosis not present

## 2017-09-27 ENCOUNTER — Ambulatory Visit (INDEPENDENT_AMBULATORY_CARE_PROVIDER_SITE_OTHER)
Admission: RE | Admit: 2017-09-27 | Discharge: 2017-09-27 | Disposition: A | Discharge status: Home / Self Care | Payer: Medicare Other | Source: Ambulatory Visit | Attending: Acute Care | Admitting: Acute Care

## 2017-09-27 DIAGNOSIS — Z87891 Personal history of nicotine dependence: Secondary | ICD-10-CM

## 2017-10-04 ENCOUNTER — Other Ambulatory Visit: Payer: Self-pay | Admitting: Acute Care

## 2017-10-04 DIAGNOSIS — Z122 Encounter for screening for malignant neoplasm of respiratory organs: Secondary | ICD-10-CM

## 2017-10-04 DIAGNOSIS — Z87891 Personal history of nicotine dependence: Secondary | ICD-10-CM

## 2017-10-31 DIAGNOSIS — J449 Chronic obstructive pulmonary disease, unspecified: Secondary | ICD-10-CM | POA: Diagnosis not present

## 2017-11-04 ENCOUNTER — Other Ambulatory Visit: Payer: Self-pay

## 2017-11-04 ENCOUNTER — Encounter: Payer: Self-pay | Admitting: Internal Medicine

## 2017-11-04 MED ORDER — FUROSEMIDE 20 MG PO TABS: ORAL_TABLET | ORAL | 3 refills | Status: DC

## 2017-11-16 ENCOUNTER — Ambulatory Visit (INDEPENDENT_AMBULATORY_CARE_PROVIDER_SITE_OTHER): Payer: Medicare Other | Admitting: *Deleted

## 2017-11-16 DIAGNOSIS — Z23 Encounter for immunization: Secondary | ICD-10-CM

## 2018-01-04 ENCOUNTER — Ambulatory Visit (INDEPENDENT_AMBULATORY_CARE_PROVIDER_SITE_OTHER): Payer: Medicare Other | Admitting: Cardiology

## 2018-01-04 ENCOUNTER — Encounter: Payer: Self-pay | Admitting: Cardiology

## 2018-01-04 VITALS — BP 138/82 | HR 77 | Ht 65.0 in | Wt 251.2 lb

## 2018-01-04 DIAGNOSIS — J439 Emphysema, unspecified: Secondary | ICD-10-CM

## 2018-01-04 DIAGNOSIS — I110 Hypertensive heart disease with heart failure: Secondary | ICD-10-CM | POA: Diagnosis not present

## 2018-01-04 DIAGNOSIS — R079 Chest pain, unspecified: Secondary | ICD-10-CM | POA: Diagnosis not present

## 2018-01-04 DIAGNOSIS — E785 Hyperlipidemia, unspecified: Secondary | ICD-10-CM

## 2018-01-04 DIAGNOSIS — I10 Essential (primary) hypertension: Secondary | ICD-10-CM | POA: Diagnosis not present

## 2018-01-04 DIAGNOSIS — I5032 Chronic diastolic (congestive) heart failure: Secondary | ICD-10-CM | POA: Diagnosis not present

## 2018-01-04 DIAGNOSIS — I499 Cardiac arrhythmia, unspecified: Secondary | ICD-10-CM

## 2018-01-04 NOTE — Patient Instructions (Signed)
Medication Instructions:  NO MEDICATION CHANGES CURRENTLY If you need a refill on your cardiac medications before your next appointment, please call your pharmacy.   Lab work: NOT NEEDED If you have labs (blood work) drawn today and your tests are completely normal, you will receive your results only by: [name] MyChart Message (if you have MyChart) OR . A paper copy in the mail If you have any lab test that is abnormal or we need to change your treatment, we will call you to review the results.  Testing/Procedures: NOT NEEDED  Follow-Up: At [facility], you and your health needs are our priority.  As part of our continuing mission to provide you with exceptional heart care, we have created designated Provider Care Teams.  These Care Teams include your primary Cardiologist (physician) and Advanced Practice Providers (APPs -  Physician Assistants and Nurse Practitioners) who all work together to provide you with the care you need, when you need it. You will need a follow up appointment in 6 months.  Please call our office 2 months in advance to schedule this appointment.  You may see Glenetta Hew, MD or one of the following Advanced Practice Providers on your designated Care Team:   Rosaria Ferries, PA-C . Jory Sims, DNP, ANP  Any Other Special Instructions Will Be Listed Below (If Applicable).

## 2018-01-04 NOTE — Progress Notes (Signed)
PCP: Binnie Rail, MD  Clinic Note: Chief Complaint  Patient presents with  . Follow-up    Palpitations improved    HPI: April Combs is a 72 y.o. 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  April Combs is the wife of one of my long-term patients [name]. 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  She was given a diagnosis of emphysema on chest x-ray and has had profound exertional dyspnea.  Tonae Livolsi was last seen in May after Cor Ca Score & CTA.  Still noted dyspnea & fatigue.  DOE.  Not really tolerating Lasix b/c cramping.   Recent Hospitalizations: None  Studies Personally Reviewed - (if available, images/films reviewed: From Epic Chart or Care Everywhere)  Coronary Calcium Score/CT Angiogram 07/13/2017: Calcium score 40.  Mild LAD stenosis, but no other significant disease.  Interval History: April Combs returns today overall relatively stable cardiac standpoint.  He says her breathing is much better.  She finally had someone put her on a medication that helps her from a breathing standpoint she is taking Trelegy and can now walk back and forth to the mailbox.  This is new for her. Not really having any significant exertional chest tightness but she does feel a tightness and pressure in her chest when she lies down at night making her somewhat dyspneic as well.  She sleeps on about 6 pillows.  Otherwise she does not really have a lot of edema and no real PND.  She is using her Lasix very infrequently.  May be every other day at the most.  Every now notes her to get a couple days in a row.  She says that her energy level has improved now that her breathing is better.  She denies any syncope or near syncope type episodes.  No bleeding issues. Palpitations have also improved -but she did have a bad episode yesterday for quite  a while..  No claudication.  Cardiovascular ROS: positive for - dyspnea on exertion, edema, orthopnea, palpitations, shortness of breath and exertional chest tightness negative for - loss of consciousness, murmur, paroxysmal nocturnal dyspnea, rapid heart rate or syncope/near syncope or TIA / Amaurosis fugax   ROS: A comprehensive was performed.  Pertinent symptoms noted above Review of Systems  Constitutional: Negative for malaise/fatigue and weight loss.  HENT: Negative for congestion.   Respiratory: Positive for cough (Remains intermittent) and shortness of breath.   Cardiovascular:       Per HPI  Gastrointestinal: Negative for blood in stool, constipation, heartburn ( definitely got worse with steroids) and melena.  Genitourinary: Negative for dysuria and hematuria.  Musculoskeletal: Positive for joint pain (1 more episode of Gout).  Neurological: Positive for dizziness (Only she stands up too quickly).  Psychiatric/Behavioral: The patient is nervous/anxious.   All other systems reviewed and are negative.  I have reviewed and (if needed) personally updated the patient's problem list, medications, allergies, past medical and surgical history, social and family history.   Past Medical History:  Diagnosis Date  . Anemia   . Anxiety   . Coronary artery disease (CAD) excluded 01/2014   Low Risk Myoview (false positive) with possible inferior-inferolateral ischemia, but with nonsustained VT --> CATH --> Angiographically Normal Coronary Arteries; coronary CT angiogram showed mild approximately disease but otherwise no obstructive disease.  Coronary calcium score 40.  [name]  Depression   . GERD (gastroesophageal reflux disease)   . Hyperlipidemia   . Hyperplastic colon polyp   . Hypertension   . NICM (nonischemic cardiomyopathy) ([place])    Essentially resolved. Echocardiogram July 2013 showed normal EF greater than 55%. Important septal motion. Normal LV filling pressures. Mild MR and mild  anterior MVP  . Obesity (BMI 30-39.9)   . Osteoarthritis   . Positional vertigo     Past Surgical History:  Procedure Laterality Date  . ABDOMINAL HYSTERECTOMY    . APPENDECTOMY  as child  . BREAST BIOPSY Right    benign  . CHOLECYSTECTOMY    . COLONOSCOPY    . CORONARY CALCIUM SCORE/CT ANGIOGRAM  07/2017   Calcium score 40.  Mild LAD stenosis, but no other significant disease.  Jodelle Gross ECHOCARDIOGRAPHY  July 2013   09/20/11. normal Lv thickness and function with EF greater thatn 55%  . Exercise tolerance test - CPET-MET-TEST  July 2013   Submaximal effort. Only 80% of her, therefore peak VO2 of 75% is likely an underestimate. High resting heart rate, achieving 86% of heart rate. --> Unable to interpret due to lack of effort.  [name] GASTRIC BYPASS  1980  . GASTRIC BYPASS    . KIDNEY STONE SURGERY    . LEFT HEART CATHETERIZATION WITH CORONARY ANGIOGRAM N/A 02/01/2014   Procedure: LEFT HEART CATHETERIZATION WITH CORONARY ANGIOGRAM;  Surgeon: Leonie Man, MD;  Location[facility] C[facility] LAB;  Service: Cardiovascular: Angiographically normal coronaries  . NM MYOVIEW LTD  November 2015   4:20 min, 4.6 METS --> DOE, but no chest discomfort; Significant + ST -T wave changes noted with NSVT & PVCs. Images suggest Inferior-inferolateral ischemia.  Low Risk. -- FALSE POSITIVE  . Pulmonary Function Tests  July 2013   Increased densities, decreased FVC - consistent with obesity hypoventilation; FEV1 is 58%, FVC 60% of predicted.  . TRANSTHORACIC ECHOCARDIOGRAM  04/2017   Mildly reduced EF of 45%.  No regional wall motion normality.  GR 1 DD  . UPPER GASTROINTESTINAL ENDOSCOPY      Current Meds  Medication Sig  . allopurinol (ZYLOPRIM) 100 MG tablet Take 2 tablets (200 mg total) by mouth daily.  [name] atorvastatin (LIPITOR) 20 MG tablet Take 1 tablet (20 mg total) by mouth daily.  . carvedilol (COREG) 12.5 MG tablet Take 1.5 tablets (18.75 mg total) by mouth 2 (two) times daily with a meal.  .  Fluticasone-Umeclidin-Vilant (TRELEGY ELLIPTA IN) Inhale into the lungs.  . furosemide (LASIX) 20 MG tablet Take furosemide 20 mg daily through the weekend, then for one week take a 20 mg tablet every other day, after that take 20 mg as needed.  [name] lisinopril-hydrochlorothiazide (PRINZIDE,ZESTORETIC) 20-25 MG tablet Take 1 tablet by mouth daily.  . nitroGLYCERIN (NITROSTAT) 0.4 MG SL tablet Place 1 tablet (0.4 mg total) under the tongue every 5 (five) minutes as needed for chest pain.  . ranitidine (ZANTAC) 300 MG tablet Take 1 tablet (300 mg total) by mouth at bedtime.    No Known Allergies  Social History   Tobacco Use  . Smoking status: Former Smoker    Packs/day: 1.00    Years: 30.00    Pack years: 30.00    Last attempt to quit: 01/18/2003    Years since quitting: 14.9  . Smokeless tobacco: Never Used  Substance Use Topics  . Alcohol use: Yes    Alcohol/week: 3.0 standard drinks    Types: 3 Cans of beer per week  Comment: drinks 2-3 beers a week  . Drug use: No   Social History   Social History Narrative   She is married to [name], also patient of Dr. Ellyn Hack.   Mother of 3, grandmother 33.   Quit smoking in 2005. Social alcohol.   No routine exercise.    family history includes Breast cancer in her maternal aunt; Colon cancer in her maternal aunt, maternal uncle, and other; Diabetes in her daughter and sister; Heart disease in her father, maternal aunt, mother, and paternal grandfather; Kidney disease in her daughter; Lung cancer in her sister; Multiple sclerosis in her daughter; Thyroid disease in her mother.  Wt Readings from Last 3 Encounters:  01/04/18 251 lb 3.2 oz (113.9 kg)  08/10/17 242 lb 6.4 oz (110 kg)  08/10/17 241 lb (109.3 kg)    PHYSICAL EXAM BP 138/82   Pulse 77   Ht 5' 5"  (1.651 m)   Wt 251 lb 3.2 oz (113.9 kg)   SpO2 97%   BMI 41.80 kg/m  Physical Exam  Constitutional: She is oriented to person, place, and time. She appears  well-developed and well-nourished.  Well-groomed.  Obese  HENT:  Head: Normocephalic and atraumatic.  Eyes: EOM are normal.  Neck: No hepatojugular reflux and no JVD (~8 cm H20) present. Carotid bruit is not present.  Cardiovascular: Normal rate, regular rhythm and normal pulses.  Occasional extrasystoles are present. PMI is not displaced (Unable to palpate). Exam reveals no gallop and no friction rub.  Murmur heard.  Holosystolic murmur is present with a grade of 1/6 at the upper left sternal border radiating to the back. Borderline tachycardic  Pulmonary/Chest: Effort normal. No respiratory distress. She exhibits no tenderness.  Mildly decreased breath sounds in both bases.  No obvious rales or rhonchi. Mild diffuse interstitial sounds  Abdominal: Soft. Bowel sounds are normal. She exhibits no distension. There is no tenderness.  Musculoskeletal: Normal range of motion. She exhibits edema (Trivial 1+ BLE).  Neurological: She is alert and oriented to person, place, and time.  Psychiatric: She has a normal mood and affect. Her behavior is normal. Judgment and thought content normal.  Nursing note and vitals reviewed.    Adult ECG Report N/a  Other studies Reviewed: Additional studies/ records that were reviewed today include:  Recent Labs:    Lab Results  Component Value Date   CHOL 173 08/10/2017   HDL 57.60 08/10/2017   LDLCALC 56 02/18/2016   LDLDIRECT 83.0 08/10/2017   TRIG 208.0 (H) 08/10/2017   CHOLHDL 3 08/10/2017    Lab Results  Component Value Date   CREATININE 0.75 08/10/2017   BUN 21 08/10/2017   NA 141 08/10/2017   K 4.4 08/10/2017   CL 104 08/10/2017   CO2 29 08/10/2017   ASSESSMENT / PLAN: Problem List Items Addressed This Visit    Chest pain on exertion (Chronic)    Pretty much confirmed minimal CAD on cardiac CTA.  This would argue against anginal symptoms.  Probably could be microvascular with small vessel disease that would not show up on these tests.   She has had a false positive nuclear stress test In the past as well.      Essential hypertension (Chronic)    Stable blood pressures at this point--better with increased dose of lisinopril-HCTZ.  Low threshold to consider adding amlodipine or felodipine for pulmonary arterial vasodilation. For now no changes.      Hyperlipidemia with target LDL less than 100 (Chronic)  Labs look great on current meds.  No change.  Continuing current Lipitor dose.      Hypertensive 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 (HCC) - Primary (Chronic)    On stable dose of ACE inhibitor and carvedilol.  Seems relatively euvolemic, but does have orthopnea.  I think she probably is better to just simply taking her Lasix daily as opposed to every other day now and then additionally as needed.      Irregular heart beat (Chronic)    For the most part controlled with current dose of beta-blocker.  I told her that she can take an additional half tablet of beta-blocker if necessary for bad outbreaks.      Pulmonary emphysema (HCC) (Chronic)    Feels much better now that she is started Trelegy.      Relevant Medications   Fluticasone-Umeclidin-Vilant (TRELEGY ELLIPTA IN)      Current medicines are reviewed at length with the patient today. (+/- concerns) still short of breath, edematous with weight gain The following changes have been made:See below  Patient Instructions  Medication Instructions:  NO MEDICATION CHANGES CURRENTLY If you need a refill on your cardiac medications before your next appointment, please call your pharmacy.   Lab work: NOT NEEDED If you have labs (blood work) drawn today and your tests are completely normal, you will receive your results only by: [name] MyChart Message (if you have MyChart) OR . A paper copy in the mail If you have any lab test that is abnormal or we need to change your treatment, we will call you to review the  results.  Testing/Procedures: NOT NEEDED  Follow-Up: At [facility], you and your health needs are our priority.  As part of our continuing mission to provide you with exceptional heart care, we have created designated Provider Care Teams.  These Care Teams include your primary Cardiologist (physician) and Advanced Practice Providers (APPs -  Physician Assistants and Nurse Practitioners) who all work together to provide you with the care you need, when you need it. You will need a follow up appointment in 6 months.  Please call our office 2 months in advance to schedule this appointment.  You may see Glenetta Hew, MD or one of the following Advanced Practice Providers on your designated Care Team:   Rosaria Ferries, PA-C . Jory Sims, DNP, ANP  Any Other Special Instructions Will Be Listed Below (If Applicable).     Studies Ordered:   No orders of the defined types were placed in this encounter.  Unless hers coronary CT angiogram is highly abnormal, she will follow-up as originally scheduled in May June timeframe with her husband.   Glenetta Hew, M.D., M.S. Interventional Cardiologist   Pager # [phone] Phone # [phone] [address] [place], [place] [address]   Thank you for choosing Heartcare at [facility]!!

## 2018-01-07 ENCOUNTER — Encounter: Payer: Self-pay | Admitting: Cardiology

## 2018-01-07 NOTE — Assessment & Plan Note (Signed)
On stable dose of ACE inhibitor and carvedilol.  Seems relatively euvolemic, but does have orthopnea.  I think she probably is better to just simply taking her Lasix daily as opposed to every other day now and then additionally as needed.

## 2018-01-07 NOTE — Assessment & Plan Note (Signed)
Feels much better now that she is started Trelegy.

## 2018-01-07 NOTE — Assessment & Plan Note (Signed)
>>  ASSESSMENT AND PLAN FOR PULMONARY EMPHYSEMA (HCC) WRITTEN ON 01/07/2018  5:32 PM BY HARDING, ALM ORN, MD  Feels much better now that she is started Trelegy.

## 2018-01-07 NOTE — Assessment & Plan Note (Signed)
Stable blood pressures at this point--better with increased dose of lisinopril-HCTZ.  Low threshold to consider adding amlodipine or felodipine for pulmonary arterial vasodilation. For now no changes.

## 2018-01-07 NOTE — Assessment & Plan Note (Signed)
Pretty much confirmed minimal CAD on cardiac CTA.  This would argue against anginal symptoms.  Probably could be microvascular with small vessel disease that would not show up on these tests.  She has had a false positive nuclear stress test In the past as well.

## 2018-01-07 NOTE — Assessment & Plan Note (Signed)
For the most part controlled with current dose of beta-blocker.  I told her that she can take an additional half tablet of beta-blocker if necessary for bad outbreaks.

## 2018-01-07 NOTE — Assessment & Plan Note (Signed)
Labs look great on current meds.  No change.  Continuing current Lipitor dose.

## 2018-01-27 ENCOUNTER — Other Ambulatory Visit: Payer: Self-pay

## 2018-02-14 NOTE — Patient Instructions (Addendum)
  Tests ordered today. Your results will be released to MyChart (or called to you) after review, usually within 72hours after test completion. If any changes need to be made, you will be notified at that same time.  Medications reviewed and updated.  Changes include :   none      Please followup in 6 months   

## 2018-02-14 NOTE — Progress Notes (Signed)
Subjective:    Patient ID: April Combs, female    DOB: 1945/12/20, 72 y.o.   MRN: [id number]  HPI The patient is here for follow up.  She is having leg weakness and has difficulty getting up from a chair.  She has to use her arms to push her up.  She does have some knee pain at times.  Her legs have been weak for a while, but they feel like they have gotten worse.  She is not currently exercising.  Her weight has increased despite no changes to her eating habits.  Hypertension, leg edema: She is taking her medication daily, except the lasix which she takes 1-2 times a week. She is compliant with a low sodium diet.  She denies chest pain and regular headaches. She is not exercising regularly.  She does not monitor her blood pressure at home.    Hyperlipidemia: She is taking her medication daily. She is compliant with a low fat/cholesterol diet. She is not exercising regularly. She denies myalgias.   Gout:  She is taking allopurinol daily.  She has not had any gout attacks - she thought it tried to come once but it did not come.   Prediabetes:  She is compliant with a low sugar diet, but eats a lot of potatoes.   She is not exercising regularly.  Mod-severe COPD:  She has chronic SOB with exertion.  She is using trelegy daily.  The inhaler is helping.  She also has a rescue inhaler to use if needed.  She follows with pulmonary at [facility].      GERD:  She is taking her zantac daily as prescribed.  She plans on switching to something different due to the recall.   Her GERD has been well controlled.  Subclinical hypothyroidism:  She denies fatigue.  She has gained weight and denies any changes in her eating habits.  She is not exercising regularly, but does not feel that her activity level has changed..     Medications and allergies reviewed with patient and updated if appropriate.  Patient Active Problem List   Diagnosis Date Noted  . Hyperglycemia 08/10/2017  . Hypertensive heart  disease with chronic diastolic congestive heart failure ([place]) 04/26/2017  . Gout 04/10/2017  . Subclinical hypothyroidism 02/09/2017  . Morbid obesity ([place]) 10/15/2015  . Dyspnea and respiratory abnormality 04/29/2015  . Pulmonary emphysema ([place]) 04/29/2015  . BRBPR (bright red blood per rectum) 02/05/2015  . Lumbar radiculopathy 12/24/2014  . Hx of tobacco use, presenting hazards to health 09/29/2014  . Nail fungus 03/11/2014  . FALSE POSITIVE NUCLEAR STRESS CHEST 01/28/2014  . Chest pain on exertion 01/14/2014  . GERD (gastroesophageal reflux disease) 05/15/2012  . Positional vertigo 12/22/2011  . Irregular heart beat 09/29/2011  . Fungal dermatitis 09/29/2011  . Hyperlipidemia with target LDL less than 100 09/29/2011  . SOB (shortness of breath) 08/23/2011  . HYPERTRIGLYCERIDEMIA 07/12/2008  . Essential hypertension 05/28/2008  . OSTEOARTHRITIS 05/28/2008    Current Outpatient Medications on File Prior to Visit  Medication Sig Dispense Refill  . allopurinol (ZYLOPRIM) 100 MG tablet Take 2 tablets (200 mg total) by mouth daily. 60 tablet 11  . atorvastatin (LIPITOR) 20 MG tablet Take 1 tablet (20 mg total) by mouth daily. 90 tablet 3  . carvedilol (COREG) 12.5 MG tablet Take 1.5 tablets (18.75 mg total) by mouth 2 (two) times daily with a meal. 270 tablet 3  . Fluticasone-Umeclidin-Vilant (TRELEGY ELLIPTA) 100-62.5-25 MCG/INH AEPB     .  furosemide (LASIX) 20 MG tablet Take furosemide 20 mg daily through the weekend, then for one week take a 20 mg tablet every other day, after that take 20 mg as needed. 30 tablet 3  . lisinopril-hydrochlorothiazide (PRINZIDE,ZESTORETIC) 20-25 MG tablet Take 1 tablet by mouth daily. 90 tablet 3  . ranitidine (ZANTAC) 300 MG tablet Take 1 tablet (300 mg total) by mouth at bedtime. 90 tablet 3  . nitroGLYCERIN (NITROSTAT) 0.4 MG SL tablet Place 1 tablet (0.4 mg total) under the tongue every 5 (five) minutes as needed for chest pain. 25 tablet 6  .  nitroGLYCERIN (NITROSTAT) 0.4 MG SL tablet PLACE 1 TABLET UNDER THE TONGUE EVERY 5 MINUTES AS NEEDED FOR CHEST PAIN  6   No current facility-administered medications on file prior to visit.     Past Medical History:  Diagnosis Date  . Anemia   . Anxiety   . Coronary artery disease (CAD) excluded 01/2014   Low Risk Myoview (false positive) with possible inferior-inferolateral ischemia, but with nonsustained VT --> CATH --> Angiographically Normal Coronary Arteries; coronary CT angiogram showed mild approximately disease but otherwise no obstructive disease.  Coronary calcium score 40.  . Depression   . GERD (gastroesophageal reflux disease)   . Hyperlipidemia   . Hyperplastic colon polyp   . Hypertension   . NICM (nonischemic cardiomyopathy) ([place])    Essentially resolved. Echocardiogram July 2013 showed normal EF greater than 55%. Important septal motion. Normal LV filling pressures. Mild MR and mild anterior MVP  . Obesity (BMI 30-39.9)   . Osteoarthritis   . Positional vertigo     Past Surgical History:  Procedure Laterality Date  . ABDOMINAL HYSTERECTOMY    . APPENDECTOMY  as child  . BREAST BIOPSY Right    benign  . CHOLECYSTECTOMY    . COLONOSCOPY    . CORONARY CALCIUM SCORE/CT ANGIOGRAM  07/2017   Calcium score 40.  Mild LAD stenosis, but no other significant disease.  Jodelle Gross ECHOCARDIOGRAPHY  July 2013   09/20/11. normal Lv thickness and function with EF greater thatn 55%  . Exercise tolerance test - CPET-MET-TEST  July 2013   Submaximal effort. Only 80% of her, therefore peak VO2 of 75% is likely an underestimate. High resting heart rate, achieving 86% of heart rate. --> Unable to interpret due to lack of effort.  [name] GASTRIC BYPASS  1980  . GASTRIC BYPASS    . KIDNEY STONE SURGERY    . LEFT HEART CATHETERIZATION WITH CORONARY ANGIOGRAM N/A 02/01/2014   Procedure: LEFT HEART CATHETERIZATION WITH CORONARY ANGIOGRAM;  Surgeon: Leonie Man, MD;  Location: [facility] CATH LAB;   Service: Cardiovascular: Angiographically normal coronaries  . NM MYOVIEW LTD  November 2015   4:20 min, 4.6 METS --> DOE, but no chest discomfort; Significant + ST -T wave changes noted with NSVT & PVCs. Images suggest Inferior-inferolateral ischemia.  Low Risk. -- FALSE POSITIVE  . Pulmonary Function Tests  July 2013   Increased densities, decreased FVC - consistent with obesity hypoventilation; FEV1 is 58%, FVC 60% of predicted.  . TRANSTHORACIC ECHOCARDIOGRAM  04/2017   Mildly reduced EF of 45%.  No regional wall motion normality.  GR 1 DD  . UPPER GASTROINTESTINAL ENDOSCOPY      Social History   Socioeconomic History  . Marital status: Married    Spouse name: Not on file  . Number of children: 3  . Years of education: Not on file  . Highest education level: Not on file  Occupational History  . Occupation: Retired  Scientific laboratory technician  . Financial resource strain: Not on file  . Food insecurity:    Worry: Not on file    Inability: Not on file  . Transportation needs:    Medical: Not on file    Non-medical: Not on file  Tobacco Use  . Smoking status: Former Smoker    Packs/day: 1.00    Years: 30.00    Pack years: 30.00    Last attempt to quit: 01/18/2003    Years since quitting: 15.0  . Smokeless tobacco: Never Used  Substance and Sexual Activity  . Alcohol use: Yes    Alcohol/week: 3.0 standard drinks    Types: 3 Cans of beer per week    Comment: drinks 2-3 beers a week  . Drug use: No  . Sexual activity: Not on file  Lifestyle  . Physical activity:    Days per week: Not on file    Minutes per session: Not on file  . Stress: Not on file  Relationships  . Social connections:    Talks on phone: Not on file    Gets together: Not on file    Attends religious service: Not on file    Active member of club or organization: Not on file    Attends meetings of clubs or organizations: Not on file    Relationship status: Not on file  Other Topics Concern  . Not on file    Social History Narrative   She is married to [name], also patient of Dr. Ellyn Hack.   Mother of 3, grandmother 25.   Quit smoking in 2005. Social alcohol.   No routine exercise.    Family History  Problem Relation Age of Onset  . Kidney disease Daughter   . Multiple sclerosis Daughter   . Heart disease Father   . Heart disease Mother   . Thyroid disease Mother   . Heart disease Paternal Grandfather   . Colon cancer Other        early 3's.  Genetic testing from maternal side of family.  . Colon cancer Maternal Aunt   . Breast cancer Maternal Aunt   . Lung cancer Sister        smoker  . Diabetes Sister   . Colon cancer Maternal Uncle   . Diabetes Daughter        x3  . Heart disease Maternal Aunt   . Colon polyps Neg Hx   . Esophageal cancer Neg Hx   . Gallbladder disease Neg Hx   . Rectal cancer Neg Hx   . Stomach cancer Neg Hx     Review of Systems  Constitutional: Negative for chills and fever.  HENT: Positive for congestion (blood in her nasal mucus at times), sinus pressure and sneezing. Negative for ear pain, sinus pain and sore throat.   Eyes: Positive for discharge (watery).  Respiratory: Positive for shortness of breath (with exertion). Negative for cough and wheezing.   Cardiovascular: Positive for palpitations. Negative for chest pain and leg swelling.  Neurological: Negative for light-headedness and headaches.       Objective:   Vitals:   02/15/18 0840  BP: 130/80  Pulse: 81  Resp: 18  Temp: (!) 97.5 F (36.4 C)  SpO2: 97%   BP Readings from Last 3 Encounters:  02/15/18 130/80  01/04/18 138/82  08/10/17 (!) 146/76   Wt Readings from Last 3 Encounters:  02/15/18 256 lb (116.1 kg)  01/04/18 251 lb  3.2 oz (113.9 kg)  08/10/17 242 lb 6.4 oz (110 kg)   Body mass index is 42.6 kg/m.   Physical Exam    Constitutional: Appears well-developed and well-nourished. No distress.  HENT:  Head: Normocephalic and atraumatic.  Neck: Neck supple.  No tracheal deviation present. No thyromegaly present.  No cervical lymphadenopathy Cardiovascular: Normal rate, regular rhythm and normal heart sounds.   No murmur heard. No carotid bruit .  No edema Pulmonary/Chest: Effort normal and breath sounds normal. No respiratory distress. No has no wheezes. No rales.  Skin: Skin is warm and dry. Not diaphoretic.  Psychiatric: Normal mood and affect. Behavior is normal.      Assessment & Plan:    See Problem List for Assessment and Plan of chronic medical problems.

## 2018-02-15 ENCOUNTER — Other Ambulatory Visit (INDEPENDENT_AMBULATORY_CARE_PROVIDER_SITE_OTHER): Payer: Medicare Other

## 2018-02-15 ENCOUNTER — Encounter: Payer: Self-pay | Admitting: Internal Medicine

## 2018-02-15 ENCOUNTER — Ambulatory Visit (INDEPENDENT_AMBULATORY_CARE_PROVIDER_SITE_OTHER): Payer: Medicare Other | Admitting: Internal Medicine

## 2018-02-15 VITALS — BP 130/80 | HR 81 | Temp 97.5°F | Resp 18 | Ht 65.0 in | Wt 256.0 lb

## 2018-02-15 DIAGNOSIS — E038 Other specified hypothyroidism: Secondary | ICD-10-CM

## 2018-02-15 DIAGNOSIS — R29898 Other symptoms and signs involving the musculoskeletal system: Secondary | ICD-10-CM

## 2018-02-15 DIAGNOSIS — M109 Gout, unspecified: Secondary | ICD-10-CM

## 2018-02-15 DIAGNOSIS — I10 Essential (primary) hypertension: Secondary | ICD-10-CM | POA: Diagnosis not present

## 2018-02-15 DIAGNOSIS — E785 Hyperlipidemia, unspecified: Secondary | ICD-10-CM | POA: Diagnosis not present

## 2018-02-15 DIAGNOSIS — J439 Emphysema, unspecified: Secondary | ICD-10-CM | POA: Diagnosis not present

## 2018-02-15 DIAGNOSIS — K219 Gastro-esophageal reflux disease without esophagitis: Secondary | ICD-10-CM

## 2018-02-15 DIAGNOSIS — R739 Hyperglycemia, unspecified: Secondary | ICD-10-CM

## 2018-02-15 DIAGNOSIS — E039 Hypothyroidism, unspecified: Secondary | ICD-10-CM | POA: Diagnosis not present

## 2018-02-15 LAB — LIPID PANEL
CHOLESTEROL: 179 mg/dL (ref 0–200)
HDL: 59.5 mg/dL (ref >39.00)
NonHDL: 119.79
Total CHOL/HDL Ratio: 3
Triglycerides: 217 mg/dL — ABNORMAL HIGH (ref 0.0–149.0)
VLDL: 43.4 mg/dL — ABNORMAL HIGH (ref 0.0–40.0)

## 2018-02-15 LAB — COMPREHENSIVE METABOLIC PANEL
ALT: 16 U/L (ref 0–35)
AST: 18 U/L (ref 0–37)
Albumin: 4.3 g/dL (ref 3.5–5.2)
Alkaline Phosphatase: 68 U/L (ref 39–117)
BUN: 23 mg/dL (ref 6–23)
CO2: 27 meq/L (ref 19–32)
CREATININE: 0.74 mg/dL (ref 0.40–1.20)
Calcium: 9.3 mg/dL (ref 8.4–10.5)
Chloride: 105 mEq/L (ref 96–112)
GFR: 82.11 mL/min (ref >60.00)
Glucose, Bld: 117 mg/dL — ABNORMAL HIGH (ref 70–99)
Potassium: 4.2 mEq/L (ref 3.5–5.1)
SODIUM: 141 meq/L (ref 135–145)
Total Bilirubin: 0.4 mg/dL (ref 0.2–1.2)
Total Protein: 7.2 g/dL (ref 6.0–8.3)

## 2018-02-15 LAB — URIC ACID: Uric Acid, Serum: 5.3 mg/dL (ref 2.4–7.0)

## 2018-02-15 LAB — LDL CHOLESTEROL, DIRECT: Direct LDL: 94 mg/dL

## 2018-02-15 LAB — CBC WITH DIFFERENTIAL/PLATELET
Basophils Absolute: 0.1 10*3/uL (ref 0.0–0.1)
Basophils Relative: 1.3 % (ref 0.0–3.0)
Eosinophils Absolute: 0.3 10*3/uL (ref 0.0–0.7)
Eosinophils Relative: 5 % (ref 0.0–5.0)
HCT: 39.3 % (ref 36.0–46.0)
Hemoglobin: 13.2 g/dL (ref 12.0–15.0)
LYMPHS ABS: 1.5 10*3/uL (ref 0.7–4.0)
Lymphocytes Relative: 22.1 % (ref 12.0–46.0)
MCHC: 33.7 g/dL (ref 30.0–36.0)
MCV: 96.3 fl (ref 78.0–100.0)
MONO ABS: 0.6 10*3/uL (ref 0.1–1.0)
Monocytes Relative: 8.9 % (ref 3.0–12.0)
NEUTROS ABS: 4.2 10*3/uL (ref 1.4–7.7)
Neutrophils Relative %: 62.7 % (ref 43.0–77.0)
PLATELETS: 166 10*3/uL (ref 150.0–400.0)
RBC: 4.08 Mil/uL (ref 3.87–5.11)
RDW: 14.7 % (ref 11.5–15.5)
WBC: 6.7 10*3/uL (ref 4.0–10.5)

## 2018-02-15 LAB — TSH: TSH: 6.3 u[IU]/mL — ABNORMAL HIGH (ref 0.35–4.50)

## 2018-02-15 LAB — T4, FREE: Free T4: 0.69 ng/dL (ref 0.60–1.60)

## 2018-02-15 LAB — HEMOGLOBIN A1C: Hgb A1c MFr Bld: 6.1 % (ref 4.6–6.5)

## 2018-02-15 NOTE — Assessment & Plan Note (Signed)
Currently controlled on Zantac, but because of the recall she plans on switching to a different medication Discussed over-the-counter options-encouraged retrying Pepcid Advised to let me know if she has any questions or if her heartburn is not controlled

## 2018-02-15 NOTE — Assessment & Plan Note (Signed)
Chronic leg weakness that has gotten worse She has difficulty getting up from a chair without pushing herself up with her arms Likely deconditioning, possible knee arthritis contributing Deferred PT Encourage more regular exercise-increase walking ideally Printout of leg strengthening exercises given advised her to try to do at least a few of these on a regular basis

## 2018-02-15 NOTE — Assessment & Plan Note (Signed)
BP well controlled Current regimen effective and well tolerated Continue current medications at current doses cmp  

## 2018-02-15 NOTE — Assessment & Plan Note (Signed)
Has not had any gout attacks since her last visit Taking allopurinol 200 mg daily Check uric acid level

## 2018-02-15 NOTE — Assessment & Plan Note (Signed)
Has had weight gain No obvious changes in energy level Check TSH, free T4, thyroid antibodies May need to initiate therapy

## 2018-02-15 NOTE — Assessment & Plan Note (Signed)
Check a1c Low sugar / carb diet Stressed regular exercise, weight loss  

## 2018-02-15 NOTE — Assessment & Plan Note (Signed)
Check lipid panel  Continue daily statin Regular exercise and healthy diet encouraged  

## 2018-02-15 NOTE — Assessment & Plan Note (Signed)
>>  ASSESSMENT AND PLAN FOR PULMONARY EMPHYSEMA (HCC) WRITTEN ON 02/15/2018 11:49 AM BY BURNS, GLADE PARAS, MD  Following with pulmonary at [facility] Using Trelegy daily and her shortness of breath is improved Has rescue inhaler to use as needed Encouraged increasing her activity and working on weight loss, which may further help her shortness of breath

## 2018-02-15 NOTE — Assessment & Plan Note (Signed)
Following with pulmonary at [place] daily and her shortness of breath is improved Has rescue inhaler to use as needed Encouraged increasing her activity and working on weight loss, which may further help her shortness of breath

## 2018-02-16 LAB — THYROID ANTIBODIES
Thyroglobulin Ab: <1 IU/mL (ref <=1)
Thyroperoxidase Ab SerPl-aCnc: <1 IU/mL (ref <9)

## 2018-02-19 ENCOUNTER — Other Ambulatory Visit: Payer: Self-pay | Admitting: Internal Medicine

## 2018-02-19 DIAGNOSIS — E039 Hypothyroidism, unspecified: Secondary | ICD-10-CM

## 2018-02-19 MED ORDER — LEVOTHYROXINE SODIUM 25 MCG PO TABS: 25.0000 ug | ORAL_TABLET | Freq: Every day | ORAL | 5 refills | Status: DC

## 2018-04-19 ENCOUNTER — Other Ambulatory Visit: Payer: Self-pay

## 2018-04-19 MED ORDER — FUROSEMIDE 20 MG PO TABS: 20.0000 mg | ORAL_TABLET | Freq: Every day | ORAL | 3 refills | Status: DC | PRN

## 2018-04-19 NOTE — Telephone Encounter (Signed)
Rx(s) sent to pharmacy electronically.  

## 2018-04-24 ENCOUNTER — Other Ambulatory Visit: Payer: Self-pay | Admitting: Internal Medicine

## 2018-05-08 ENCOUNTER — Other Ambulatory Visit: Payer: Self-pay | Admitting: Internal Medicine

## 2018-05-08 DIAGNOSIS — J449 Chronic obstructive pulmonary disease, unspecified: Secondary | ICD-10-CM | POA: Diagnosis not present

## 2018-05-08 DIAGNOSIS — Z1231 Encounter for screening mammogram for malignant neoplasm of breast: Secondary | ICD-10-CM

## 2018-05-08 DIAGNOSIS — Z6841 Body Mass Index (BMI) 40.0 and over, adult: Secondary | ICD-10-CM | POA: Diagnosis not present

## 2018-05-10 ENCOUNTER — Other Ambulatory Visit (INDEPENDENT_AMBULATORY_CARE_PROVIDER_SITE_OTHER): Payer: Medicare Other

## 2018-05-10 DIAGNOSIS — E039 Hypothyroidism, unspecified: Secondary | ICD-10-CM | POA: Diagnosis not present

## 2018-05-11 ENCOUNTER — Encounter: Payer: Self-pay | Admitting: Internal Medicine

## 2018-05-11 LAB — TSH: TSH: 3.21 u[IU]/mL (ref 0.35–4.50)

## 2018-05-12 ENCOUNTER — Other Ambulatory Visit: Payer: Self-pay | Admitting: Cardiology

## 2018-06-12 ENCOUNTER — Inpatient Hospital Stay: Admission: RE | Admit: 2018-06-12 | Payer: Medicare Other | Source: Ambulatory Visit

## 2018-07-05 ENCOUNTER — Ambulatory Visit: Payer: Medicare Other | Admitting: Cardiology

## 2018-07-12 ENCOUNTER — Ambulatory Visit: Payer: Medicare Other

## 2018-07-17 ENCOUNTER — Other Ambulatory Visit: Payer: Self-pay | Admitting: Internal Medicine

## 2018-07-24 ENCOUNTER — Telehealth: Payer: Self-pay | Admitting: *Deleted

## 2018-07-24 NOTE — Telephone Encounter (Signed)
SPOKE TO HUSBAND - APPTOINMTNET SWITCH FROM 5/20 TO 5/28 Office  VISIT ONLY

## 2018-08-02 ENCOUNTER — Ambulatory Visit: Payer: Medicare Other | Admitting: Cardiology

## 2018-08-08 ENCOUNTER — Other Ambulatory Visit: Payer: Self-pay | Admitting: Internal Medicine

## 2018-08-08 ENCOUNTER — Other Ambulatory Visit: Payer: Self-pay | Admitting: Cardiology

## 2018-08-08 NOTE — Telephone Encounter (Signed)
Rx request sent to pharmacy.  

## 2018-08-10 ENCOUNTER — Encounter: Payer: Self-pay | Admitting: Cardiology

## 2018-08-10 ENCOUNTER — Other Ambulatory Visit: Payer: Self-pay

## 2018-08-10 ENCOUNTER — Ambulatory Visit (INDEPENDENT_AMBULATORY_CARE_PROVIDER_SITE_OTHER): Payer: Medicare Other | Admitting: Cardiology

## 2018-08-10 VITALS — BP 136/84 | HR 85 | Temp 98.0°F | Ht 65.0 in | Wt 276.0 lb

## 2018-08-10 DIAGNOSIS — I499 Cardiac arrhythmia, unspecified: Secondary | ICD-10-CM

## 2018-08-10 DIAGNOSIS — E785 Hyperlipidemia, unspecified: Secondary | ICD-10-CM

## 2018-08-10 DIAGNOSIS — I5032 Chronic diastolic (congestive) heart failure: Secondary | ICD-10-CM

## 2018-08-10 DIAGNOSIS — I10 Essential (primary) hypertension: Secondary | ICD-10-CM

## 2018-08-10 DIAGNOSIS — R079 Chest pain, unspecified: Secondary | ICD-10-CM | POA: Diagnosis not present

## 2018-08-10 DIAGNOSIS — I110 Hypertensive heart disease with heart failure: Secondary | ICD-10-CM

## 2018-08-10 NOTE — Patient Instructions (Addendum)
Medication Instructions:   Take Carvedilol 18.72 Mg (1.5 tablet) daily    If you need a refill on your cardiac medications before your next appointment, please call your pharmacy.   Lab work:  NONE ordered at this time of appointment    If you have labs (blood work) drawn today and your tests are completely normal, you will receive your results only by: [name] MyChart Message (if you have MyChart) OR . A paper copy in the mail If you have any lab test that is abnormal or we need to change your treatment, we will call you to review the results.  Testing/Procedures:  NONE ordered at this time of appointment   Follow-Up: At [facility], you and your health needs are our priority.  As part of our continuing mission to provide you with exceptional heart care, we have created designated Provider Care Teams.  These Care Teams include your primary Cardiologist (physician) and Advanced Practice Providers (APPs -  Physician Assistants and Nurse Practitioners) who all work together to provide you with the care you need, when you need it. You will need a follow up appointment in 12 months (May 2021).  Please call our office in March 2021 to schedule this appointment.  You may see Glenetta Hew, MD or one of the following Advanced Practice Providers on your designated Care Team:   Rosaria Ferries, PA-C . Jory Sims, DNP, ANP  Any Other Special Instructions Will Be Listed Below (If Applicable).

## 2018-08-10 NOTE — Progress Notes (Signed)
.   PCP: Binnie Rail, MD  Clinic Note: Chief Complaint  Patient presents with  . Follow-up  . Chest Pain    Pressure.  . Edema    Feet and legs.    HPI: April Combs is a 73 y.o. 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  April Combs is the wife of one of my long-term patients April Combs). She has a history of non-obstructive CAD by cath back in a 2015 -  Coronary Calcium Score/CT Angiogram 07/13/2017: Calcium score 40.  Mild LAD stenosis, but no other significant disease She was given a diagnosis of emphysema on chest x-ray and has had profound exertional dyspnea.  April Combs was last seen in October 2019.  She is relatively stable from a cardiac standpoint.  Her breathing was notably better.  She was taking Trelegy which was allowing her to walk back and forth to the mailbox.  No longer having any real exertional chest tightness or dyspnea.  She did notice some dyspnea and chest tightness lying down flat at night.  Sleeps on multiple pillows but no real significant edema.  Not really using Lasix frequently..  Still noted dyspnea & fatigue.  DOE.  Not really tolerating Lasix.  Palpitations also notably improved.  Recent Hospitalizations: None  Studies Personally Reviewed - (if available, images/films reviewed: From Epic Chart or Care Everywhere)  No recent studies  Interval History: [name] returns today relatively stable. Still with DOE, but still better on Trelegy than before (can tell if misses dose).  Now has an adjustable bed - still with some orthopnea (probably related to body habitys); SOB after getting up to go to bathroom @ night. Edema has been more noticeable in last ~2 months - taking Lasix ~QOD & occasionally 2-3 d in a row.  Skipped beats occur off & on without regularity. Still may have off & on chest pressure - also no  regularity or association with any periarticular activity.  Cardiovascular ROS: positive for - dyspnea on exertion, edema, orthopnea, palpitations, shortness of breath and exertional chest pressure - with exertion (but not lately) negative for - loss of consciousness, murmur, paroxysmal nocturnal dyspnea, rapid heart rate or syncope/near syncope or TIA / Amaurosis fugax   ROS: A comprehensive was performed.  Pertinent symptoms noted above Review of Systems  Constitutional: Negative for malaise/fatigue and weight loss.  HENT: Negative for congestion.   Respiratory: Positive for cough (Remains intermittent - old "dry cough") and shortness of breath.   Cardiovascular: Positive for leg swelling (mild).       Per HPI  Gastrointestinal: Positive for heartburn (taking antacid). Negative for blood in stool, constipation and melena.  Genitourinary: Negative for dysuria and hematuria.  Musculoskeletal: Positive for joint pain (Gout pretty well controlled).  Neurological: Negative for dizziness (Only she stands up too quickly).  Psychiatric/Behavioral: The patient is not nervous/anxious.   All other systems reviewed and are negative.  The patient does not have symptoms concerning for COVID-19 infection (fever, chills, cough, or new shortness of breath).  The patient is not practicing social distancing.   COVID-19 Education: The signs and symptoms of COVID-19 were discussed with the patient and how to seek care for testing (follow up with PCP or arrange E-visit).   The importance of social distancing was discussed today.   I have reviewed and (if needed) personally updated the  patient's problem list, medications, allergies, past medical and surgical history, social and family history.   Past Medical History:  Diagnosis Date  . Anemia   . Anxiety   . Coronary artery disease (CAD) excluded 01/2014   Low Risk Myoview (false positive) with possible inferior-inferolateral ischemia, but with  nonsustained VT --> CATH --> Angiographically Normal Coronary Arteries; coronary CT angiogram showed mild approximately disease but otherwise no obstructive disease.  Coronary calcium score 40.  . Depression   . GERD (gastroesophageal reflux disease)   . Hyperlipidemia   . Hyperplastic colon polyp   . Hypertension   . NICM (nonischemic cardiomyopathy) ([place])    Essentially resolved. Echocardiogram July 2013 showed normal EF greater than 55%. Important septal motion. Normal LV filling pressures. Mild MR and mild anterior MVP  . Obesity (BMI 30-39.9)   . Osteoarthritis   . Positional vertigo     Past Surgical History:  Procedure Laterality Date  . ABDOMINAL HYSTERECTOMY    . APPENDECTOMY  as child  . BREAST BIOPSY Right    benign  . CHOLECYSTECTOMY    . COLONOSCOPY    . CORONARY CALCIUM SCORE/CT ANGIOGRAM  07/2017   Calcium score 40.  Mild LAD stenosis, but no other significant disease.  April Combs ECHOCARDIOGRAPHY  July 2013   09/20/11. normal Lv thickness and function with EF greater thatn 55%  . Exercise tolerance test - CPET-MET-TEST  July 2013   Submaximal effort. Only 80% of her, therefore peak VO2 of 75% is likely an underestimate. High resting heart rate, achieving 86% of heart rate. --> Unable to interpret due to lack of effort.  [name] GASTRIC BYPASS  1980  . GASTRIC BYPASS    . KIDNEY STONE SURGERY    . LEFT HEART CATHETERIZATION WITH CORONARY ANGIOGRAM N/A 02/01/2014   Procedure: LEFT HEART CATHETERIZATION WITH CORONARY ANGIOGRAM;  Surgeon: Leonie Man, MD;  Location: [facility] CATH LAB;  Service: Cardiovascular: Angiographically normal coronaries  . NM MYOVIEW LTD  November 2015   4:20 min, 4.6 METS --> DOE, but no chest discomfort; Significant + ST -T wave changes noted with NSVT & PVCs. Images suggest Inferior-inferolateral ischemia.  Low Risk. -- FALSE POSITIVE  . Pulmonary Function Tests  July 2013   Increased densities, decreased FVC - consistent with obesity hypoventilation;  FEV1 is 58%, FVC 60% of predicted.  . TRANSTHORACIC ECHOCARDIOGRAM  04/2017   Mildly reduced EF of 45%.  No regional wall motion normality.  GR 1 DD  . UPPER GASTROINTESTINAL ENDOSCOPY      Current Meds  Medication Sig  . allopurinol (ZYLOPRIM) 100 MG tablet Take 2 tablets by mouth once daily  . atorvastatin (LIPITOR) 20 MG tablet Take 1 tablet (20 mg total) by mouth daily. Need follow up for more refills.  . carvedilol (COREG) 12.5 MG tablet Take 1.5 tablets (18.75 mg total) by mouth 2 (two) times daily with a meal. (Patient taking differently: Take 18.75 mg by mouth daily. )  . Fluticasone-Umeclidin-Vilant (TRELEGY ELLIPTA) 100-62.5-25 MCG/INH AEPB   . furosemide (LASIX) 20 MG tablet Take 1 tablet (20 mg total) by mouth daily as needed.  [name] levothyroxine (SYNTHROID, LEVOTHROID) 25 MCG tablet Take 1 tablet (25 mcg total) by mouth daily before breakfast.  . lisinopril-hydrochlorothiazide (PRINZIDE,ZESTORETIC) 20-25 MG tablet Take 1 tablet by mouth daily.  . nitroGLYCERIN (NITROSTAT) 0.4 MG SL tablet DISSOLVE ONE TABLET UNDER THE TONGUE EVERY 5 MINUTES AS NEEDED FOR CHEST PAIN.  DO NOT EXCEED A TOTAL OF 3 DOSES IN 15  MINUTES  -- taking Coreg once daily 12.5 mg Lasix is QOD currently   No Known Allergies  Social History   Tobacco Use  . Smoking status: Former Smoker    Packs/day: 1.00    Years: 30.00    Pack years: 30.00    Last attempt to quit: 01/18/2003    Years since quitting: 15.5  . Smokeless tobacco: Never Used  Substance Use Topics  . Alcohol use: Yes    Alcohol/week: 3.0 standard drinks    Types: 3 Cans of beer per week    Comment: drinks 2-3 beers a week  . Drug use: No   Social History   Social History Narrative   She is married to [name], also patient of Dr. Ellyn Hack.   Mother of 3, grandmother 10.   Quit smoking in 2005. Social alcohol.   No routine exercise.  -- New Great-Grandchild - 2nd overall.  family history includes Breast cancer in her maternal  aunt; Colon cancer in her maternal aunt, maternal uncle, and another family member; Diabetes in her daughter and sister; Heart disease in her father, maternal aunt, mother, and paternal grandfather; Kidney disease in her daughter; Lung cancer in her sister; Multiple sclerosis in her daughter; Thyroid disease in her mother.  Wt Readings from Last 3 Encounters:  08/10/18 276 lb (125.2 kg)  02/15/18 256 lb (116.1 kg)  01/04/18 251 lb 3.2 oz (113.9 kg)    PHYSICAL EXAM BP 136/84 (BP Location: Left Arm, Patient Position: Sitting, Cuff Size: Normal)   Pulse 85   Temp 98 F (36.7 C)   Ht 5' 5"  (1.651 m)   Wt 276 lb (125.2 kg)   BMI 45.93 kg/m  Physical Exam  Constitutional: She is oriented to person, place, and time. She appears well-developed and well-nourished.  Well-groomed.  Obese  HENT:  Head: Normocephalic and atraumatic.  Neck: Normal range of motion. Neck supple. No hepatojugular reflux and no JVD present. Carotid bruit is not present.  Cardiovascular: Normal rate, regular rhythm and normal pulses.  Occasional extrasystoles are present. PMI is not displaced (Unable to palpate). Exam reveals distant heart sounds. Exam reveals no gallop and no friction rub.  Murmur (soft 1/6 HSM @ apex) heard. Pulmonary/Chest: Effort normal. No respiratory distress. She exhibits no tenderness.  Mildly decreased breath sounds in both bases.  No obvious rales or rhonchi. Mild diffuse interstitial sounds  Abdominal: Soft. Bowel sounds are normal. She exhibits no distension. There is no abdominal tenderness.  Musculoskeletal: Normal range of motion.        General: Edema (Trivial 1+ BLE) present.  Neurological: She is alert and oriented to person, place, and time.  Psychiatric: She has a normal mood and affect. Her behavior is normal. Judgment and thought content normal.  Nursing note and vitals reviewed.    Adult ECG Report N/a  Other studies Reviewed: Additional studies/ records that were reviewed  today include:  Recent Labs:    Lab Results  Component Value Date   CHOL 179 02/15/2018   HDL 59.50 02/15/2018   LDLCALC 56 02/18/2016   LDLDIRECT 94.0 02/15/2018   TRIG 217.0 (H) 02/15/2018   CHOLHDL 3 02/15/2018    Lab Results  Component Value Date   CREATININE 0.74 02/15/2018   BUN 23 02/15/2018   NA 141 02/15/2018   K 4.2 02/15/2018   CL 105 02/15/2018   CO2 27 02/15/2018   ASSESSMENT / PLAN: Problem List Items Addressed This Visit    Chest pain on  exertion (Chronic)    Minimal CAD noted on coronary CTA.  Could be microvascular, but is probably not cardiac. With hypertensive heart disease, the goal will be to continue blood pressure management.      Essential hypertension (Chronic)    Blood pressure actually is pretty good and she is not taking her carvedilol according to original plan.  She is mostly taking 1.5 mg twice daily, but is only taking 1 tablet once a day. Plan: Increase at least to 1-1/2 tablet once a day (preferably be 1 tablet in the morning and 1/2 tablet in the evening or vice versa, but she would probably want take it once a day).  Otherwise continue current dose of lisinopril-HCTZ.      Hyperlipidemia with target LDL less than 100 (Chronic)   Hypertensive 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 (HCC) - Primary (Chronic)    Is on ACE inhibitor and carvedilol which apparently has not been taken according to plan.  However were discussed simply reduce the dose of planned dosing but overall increase what she is taking.  She seems relatively euvolemic despite having some mild edema.  Edema is pretty well controlled with Lasix that she is taking every other day. I do not think that orthopnea is as much related to CHF as it is related to body habitus.      Relevant Orders   EKG 12-Lead (Completed)   Irregular heart beat (Chronic)    Pretty well controlled.  Since she is noticing them off and on though I do want make sure she is taking a  more appropriate dose of beta-blocker.  She is only take it once a day so I can have her days take 18.58m once a day as opposed to 12.5 mg      Relevant Orders   EKG 12-Lead (Completed)   [place]    Discussed the importance of staying active, exercising as much she can.  Also dietary adjustment.         Current medicines are reviewed at length with the patient today. (+/- concerns) still short of breath, edematous with weight gain The following changes have been made:See below  Patient Instructions  Medication Instructions:   Take Carvedilol 18.72 Mg (1.5 tablet) daily    If you need a refill on your cardiac medications before your next appointment, please call your pharmacy.   Lab work:  NONE ordered at this time of appointment    If you have labs (blood work) drawn today and your tests are completely normal, you will receive your results only by: .Marland KitchenMyChart Message (if you have MyChart) OR . A paper copy in the mail If you have any lab test that is abnormal or we need to change your treatment, we will call you to review the results.  Testing/Procedures:  NONE ordered at this time of appointment   Follow-Up: [facility] and [facility] needs [facility] our [facility].  As part of our continuing mission to provide you with exceptional heart care, we have created designated Provider Care Teams.  These Care Teams include your primary Cardiologist (physician) and Advanced Practice Providers (APPs -  Physician Assistants and Nurse Practitioners) who all work together to provide you with the care you need, when you need it. You will need a follow up appointment in 12 months (May 2021).  Please call our office in March 2021 to schedule this appointment.  You may see DGlenetta Hew MD or one  of the following Advanced Practice Providers on your designated Care Team:   Rosaria Ferries, PA-C . Jory Sims, DNP, ANP  Any Other Special Instructions Will Be  Listed Below (If Applicable).    Studies Ordered:   Orders Placed This Encounter  Procedures  . EKG 12-Lead     Glenetta Hew, M.D., M.S. Interventional Cardiologist   Pager # [phone] Phone # [phone] [address] [place], [place] [address]   Thank you for choosing Heartcare at [facility]!!

## 2018-08-13 ENCOUNTER — Encounter: Payer: Self-pay | Admitting: Cardiology

## 2018-08-13 NOTE — Assessment & Plan Note (Signed)
Discussed the importance of staying active, exercising as much she can.  Also dietary adjustment.

## 2018-08-13 NOTE — Assessment & Plan Note (Signed)
Is on ACE inhibitor and carvedilol which apparently has not been taken according to plan.  However were discussed simply reduce the dose of planned dosing but overall increase what she is taking.  She seems relatively euvolemic despite having some mild edema.  Edema is pretty well controlled with Lasix that she is taking every other day. I do not think that orthopnea is as much related to CHF as it is related to body habitus.

## 2018-08-13 NOTE — Assessment & Plan Note (Signed)
Blood pressure actually is pretty good and she is not taking her carvedilol according to original plan.  She is mostly taking 1.5 mg twice daily, but is only taking 1 tablet once a day. Plan: Increase at least to 1-1/2 tablet once a day (preferably be 1 tablet in the morning and 1/2 tablet in the evening or vice versa, but she would probably want take it once a day).  Otherwise continue current dose of lisinopril-HCTZ.

## 2018-08-13 NOTE — Assessment & Plan Note (Signed)
Minimal CAD noted on coronary CTA.  Could be microvascular, but is probably not cardiac. With hypertensive heart disease, the goal will be to continue blood pressure management.

## 2018-08-13 NOTE — Assessment & Plan Note (Signed)
Pretty well controlled.  Since she is noticing them off and on though I do want make sure she is taking a more appropriate dose of beta-blocker.  She is only take it once a day so I can have her days take 18.75mg  once a day as opposed to 12.5 mg

## 2018-08-17 NOTE — Progress Notes (Signed)
Subjective:    Patient ID: April Combs, female    DOB: 1945-11-21, 73 y.o.   MRN: [id number]  HPI The patient is here for follow up.  She is not exercising regularly.    Her whole body hurts and sometimes she has difficulty standing up from sitting.  It is her whole body - not just joints or muscles.  She does not have enough strength.    Leg edema, Hypertension: She is taking her medication daily. She is not compliant with a low sodium diet.  She denies chest pain, and regular headaches. She does not monitor her blood pressure at home.    Hyperlipidemia: She is taking her medication daily. She is not compliant with a low fat/cholesterol diet.  She eats a lot of french fries.    Prediabetes:  She is sometimes compliant with a low sugar/carbohydrate diet.  She is not exercising regularly.  Gout:  She take allopurinol daily.  She feels pain in her foot at times and she thinks it is gout.  She takes an extra allpurinol when that happens and it helps.     SOB, Mod-severe COPD:  She has chronic SOB with exertion.  She uses trelegy daily.  She follows with pulmonary.  She had a cardiac w/o recently and her SOB is not cardiac in nature.  She is sedentary.  She has gained weight.    GERD:  She is taking over-the-counter medication as needed only.  She feels her symptoms are controlled and over at this time does not need a daily medication.  Hypothyroidism:  She is taking her medication daily.  She denies any recent changes in energy or weight that are unexplained.  She has gained weight but is very sedentary.    Medications and allergies reviewed with patient and updated if appropriate.  Patient Active Problem List   Diagnosis Date Noted   Bilateral leg weakness 02/15/2018   Prediabetes 08/10/2017   Hypertensive 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 ([place]) 04/26/2017   Gout 04/10/2017   Hypothyroidism 02/09/2017   Morbid obesity ([place]) 10/15/2015    Dyspnea and respiratory abnormality 04/29/2015   Pulmonary emphysema ([place]) 04/29/2015   BRBPR (bright red blood per rectum) 02/05/2015   Lumbar radiculopathy 12/24/2014   Hx of tobacco use, presenting hazards to health 09/29/2014   Nail fungus 03/11/2014   FALSE POSITIVE NUCLEAR STRESS CHEST 01/28/2014   Chest pain on exertion 01/14/2014   GERD (gastroesophageal reflux disease) 05/15/2012   Positional vertigo 12/22/2011   Irregular heart beat 09/29/2011   Fungal dermatitis 09/29/2011   Hyperlipidemia with target LDL less than 100 09/29/2011   SOB (shortness of breath) 08/23/2011   HYPERTRIGLYCERIDEMIA 07/12/2008   Essential hypertension 05/28/2008   OSTEOARTHRITIS 05/28/2008    Current Outpatient Medications on File Prior to Visit  Medication Sig Dispense Refill   carvedilol (COREG) 12.5 MG tablet Take 1.5 tablets (18.75 mg total) by mouth 2 (two) times daily with a meal. (Patient taking differently: Take 18.75 mg by mouth daily. ) 270 tablet 3   Fluticasone-Umeclidin-Vilant (TRELEGY ELLIPTA) 100-62.5-25 MCG/INH AEPB      furosemide (LASIX) 20 MG tablet Take 1 tablet (20 mg total) by mouth daily as needed. 30 tablet 3   levothyroxine (SYNTHROID, LEVOTHROID) 25 MCG tablet Take 1 tablet (25 mcg total) by mouth daily before breakfast. 30 tablet 5   lisinopril-hydrochlorothiazide (PRINZIDE,ZESTORETIC) 20-25 MG tablet Take 1 tablet by mouth daily. 90 tablet 3   nitroGLYCERIN (NITROSTAT) 0.4  MG SL tablet DISSOLVE ONE TABLET UNDER THE TONGUE EVERY 5 MINUTES AS NEEDED FOR CHEST PAIN.  DO NOT EXCEED A TOTAL OF 3 DOSES IN 15 MINUTES 25 tablet 0   No current facility-administered medications on file prior to visit.     Past Medical History:  Diagnosis Date   Anemia    Anxiety    Coronary artery disease (CAD) excluded 01/2014   Low Risk Myoview (false positive) with possible inferior-inferolateral ischemia, but with nonsustained VT --> CATH --> Angiographically Normal  Coronary Arteries; coronary CT angiogram showed mild approximately disease but otherwise no obstructive disease.  Coronary calcium score 40.   Depression    GERD (gastroesophageal reflux disease)    Hyperlipidemia    Hyperplastic colon polyp    Hypertension    NICM (nonischemic cardiomyopathy) ([place])    Essentially resolved. Echocardiogram July 2013 showed normal EF greater than 55%. Important septal motion. Normal LV filling pressures. Mild MR and mild anterior MVP   Obesity (BMI 30-39.9)    Osteoarthritis    Positional vertigo     Past Surgical History:  Procedure Laterality Date   ABDOMINAL HYSTERECTOMY     APPENDECTOMY  as child   BREAST BIOPSY Right    benign   CHOLECYSTECTOMY     COLONOSCOPY     CORONARY CALCIUM SCORE/CT ANGIOGRAM  07/2017   Calcium score 40.  Mild LAD stenosis, but no other significant disease.   DOPPLER ECHOCARDIOGRAPHY  July 2013   09/20/11. normal Lv thickness and function with EF greater thatn 55%   Exercise tolerance test - CPET-MET-TEST  July 2013   Submaximal effort. Only 80% of her, therefore peak VO2 of 75% is likely an underestimate. High resting heart rate, achieving 86% of heart rate. --> Unable to interpret due to lack of effort.   GASTRIC BYPASS  1980   GASTRIC BYPASS     KIDNEY STONE SURGERY     LEFT HEART CATHETERIZATION WITH CORONARY ANGIOGRAM N/A 02/01/2014   Procedure: LEFT HEART CATHETERIZATION WITH CORONARY ANGIOGRAM;  Surgeon: Leonie Man, MD;  Location: [facility] CATH LAB;  Service: Cardiovascular: Angiographically normal coronaries   NM MYOVIEW LTD  November 2015   4:20 min, 4.6 METS --> DOE, but no chest discomfort; Significant + ST -T wave changes noted with NSVT & PVCs. Images suggest Inferior-inferolateral ischemia.  Low Risk. -- FALSE POSITIVE   Pulmonary Function Tests  July 2013   Increased densities, decreased FVC - consistent with obesity hypoventilation; FEV1 is 58%, FVC 60% of predicted.   TRANSTHORACIC  ECHOCARDIOGRAM  04/2017   Mildly reduced EF of 45%.  No regional wall motion normality.  GR 1 DD   UPPER GASTROINTESTINAL ENDOSCOPY      Social History   Socioeconomic History   Marital status: Married    Spouse name: Not on file   Number of children: 3   Years of education: Not on file   Highest education level: Not on file  Occupational History   Occupation: Retired  Scientist, product/process development strain: Not on file   Food insecurity:    Worry: Not on file    Inability: Not on file   Transportation needs:    Medical: Not on file    Non-medical: Not on file  Tobacco Use   Smoking status: Former Smoker    Packs/day: 1.00    Years: 30.00    Pack years: 30.00    Last attempt to quit: 01/18/2003    Years since quitting:  15.5   Smokeless tobacco: Never Used  Substance and Sexual Activity   Alcohol use: Yes    Alcohol/week: 3.0 standard drinks    Types: 3 Cans of beer per week    Comment: drinks 2-3 beers a week   Drug use: No   Sexual activity: Not on file  Lifestyle   Physical activity:    Days per week: Not on file    Minutes per session: Not on file   Stress: Not on file  Relationships   Social connections:    Talks on phone: Not on file    Gets together: Not on file    Attends religious service: Not on file    Active member of club or organization: Not on file    Attends meetings of clubs or organizations: Not on file    Relationship status: Not on file  Other Topics Concern   Not on file  Social History Narrative   She is married to [name], also patient of Dr. Ellyn Hack.   Mother of 3, grandmother 58.   Quit smoking in 2005. Social alcohol.   No routine exercise.    Family History  Problem Relation Age of Onset   Kidney disease Daughter    Multiple sclerosis Daughter    Heart disease Father    Heart disease Mother    Thyroid disease Mother    Heart disease Paternal Grandfather    Colon cancer Other        early 25's.   Genetic testing from maternal side of family.   Colon cancer Maternal Aunt    Breast cancer Maternal Aunt    Lung cancer Sister        smoker   Diabetes Sister    Colon cancer Maternal Uncle    Diabetes Daughter        x3   Heart disease Maternal Aunt    Colon polyps Neg Hx    Esophageal cancer Neg Hx    Gallbladder disease Neg Hx    Rectal cancer Neg Hx    Stomach cancer Neg Hx     Review of Systems  Constitutional: Negative for chills and fever.  Respiratory: Positive for shortness of breath. Negative for cough and wheezing.   Cardiovascular: Positive for palpitations and leg swelling. Negative for chest pain.  Musculoskeletal: Positive for arthralgias and myalgias.       Whole body pain  Neurological: Positive for headaches. Negative for dizziness and light-headedness.       Objective:   Vitals:   08/18/18 0829  BP: (!) 150/82  Pulse: 83  Resp: 18  Temp: 97.8 F (36.6 C)  SpO2: 97%   BP Readings from Last 3 Encounters:  08/18/18 (!) 150/82  08/10/18 136/84  02/15/18 130/80   Wt Readings from Last 3 Encounters:  08/18/18 274 lb 12.8 oz (124.6 kg)  08/10/18 276 lb (125.2 kg)  02/15/18 256 lb (116.1 kg)   Body mass index is 45.73 kg/m.   Physical Exam    Constitutional: Appears well-developed and well-nourished. No distress.  HENT:  Head: Normocephalic and atraumatic.  Neck: Neck supple. No tracheal deviation present. No thyromegaly present.  No cervical lymphadenopathy Cardiovascular: Normal rate, regular rhythm and normal heart sounds.   No murmur heard. No carotid bruit .  No edema Pulmonary/Chest: Effort normal and breath sounds normal. No respiratory distress. No has no wheezes. No rales.  Skin: Skin is warm and dry. Not diaphoretic.  Psychiatric: Normal mood and affect. Behavior is  normal.      Assessment & Plan:    See Problem List for Assessment and Plan of chronic medical problems.

## 2018-08-17 NOTE — Patient Instructions (Addendum)
  Tests ordered today. Your results will be released to [place] (or called to you) after review, usually within 72hours after test completion. If any changes need to be made, you will be notified at that same time.   Medications reviewed and updated.  Changes include :   Increase allopurinol to 300 mg daily.    Your prescription(s) have been submitted to your pharmacy. Please take as directed and contact our office if you believe you are having problem(s) with the medication(s).    Please followup in 6 months

## 2018-08-18 ENCOUNTER — Other Ambulatory Visit (INDEPENDENT_AMBULATORY_CARE_PROVIDER_SITE_OTHER): Payer: Medicare Other

## 2018-08-18 ENCOUNTER — Encounter: Payer: Self-pay | Admitting: Internal Medicine

## 2018-08-18 ENCOUNTER — Other Ambulatory Visit: Payer: Self-pay

## 2018-08-18 ENCOUNTER — Ambulatory Visit (INDEPENDENT_AMBULATORY_CARE_PROVIDER_SITE_OTHER): Payer: Medicare Other | Admitting: Internal Medicine

## 2018-08-18 VITALS — BP 150/82 | HR 83 | Temp 97.8°F | Resp 18 | Ht 65.0 in | Wt 274.8 lb

## 2018-08-18 DIAGNOSIS — I10 Essential (primary) hypertension: Secondary | ICD-10-CM

## 2018-08-18 DIAGNOSIS — E785 Hyperlipidemia, unspecified: Secondary | ICD-10-CM | POA: Diagnosis not present

## 2018-08-18 DIAGNOSIS — R7303 Prediabetes: Secondary | ICD-10-CM

## 2018-08-18 DIAGNOSIS — E039 Hypothyroidism, unspecified: Secondary | ICD-10-CM

## 2018-08-18 DIAGNOSIS — R5381 Other malaise: Secondary | ICD-10-CM | POA: Diagnosis not present

## 2018-08-18 DIAGNOSIS — M109 Gout, unspecified: Secondary | ICD-10-CM

## 2018-08-18 DIAGNOSIS — K219 Gastro-esophageal reflux disease without esophagitis: Secondary | ICD-10-CM

## 2018-08-18 DIAGNOSIS — J439 Emphysema, unspecified: Secondary | ICD-10-CM

## 2018-08-18 LAB — CBC WITH DIFFERENTIAL/PLATELET
Basophils Absolute: 0.1 10*3/uL (ref 0.0–0.1)
Basophils Relative: 1.1 % (ref 0.0–3.0)
Eosinophils Absolute: 0.4 10*3/uL (ref 0.0–0.7)
Eosinophils Relative: 6.8 % — ABNORMAL HIGH (ref 0.0–5.0)
HCT: 37.3 % (ref 36.0–46.0)
Hemoglobin: 12.7 g/dL (ref 12.0–15.0)
Lymphocytes Relative: 26.4 % (ref 12.0–46.0)
Lymphs Abs: 1.7 10*3/uL (ref 0.7–4.0)
MCHC: 34 g/dL (ref 30.0–36.0)
MCV: 96.6 fl (ref 78.0–100.0)
Monocytes Absolute: 0.5 10*3/uL (ref 0.1–1.0)
Monocytes Relative: 8.6 % (ref 3.0–12.0)
Neutro Abs: 3.6 10*3/uL (ref 1.4–7.7)
Neutrophils Relative %: 57.1 % (ref 43.0–77.0)
Platelets: 168 10*3/uL (ref 150.0–400.0)
RBC: 3.86 Mil/uL — ABNORMAL LOW (ref 3.87–5.11)
RDW: 15.2 % (ref 11.5–15.5)
WBC: 6.4 10*3/uL (ref 4.0–10.5)

## 2018-08-18 LAB — COMPREHENSIVE METABOLIC PANEL
ALT: 15 U/L (ref 0–35)
AST: 16 U/L (ref 0–37)
Albumin: 4 g/dL (ref 3.5–5.2)
Alkaline Phosphatase: 77 U/L (ref 39–117)
BUN: 22 mg/dL (ref 6–23)
CO2: 26 mEq/L (ref 19–32)
Calcium: 9.1 mg/dL (ref 8.4–10.5)
Chloride: 105 mEq/L (ref 96–112)
Creatinine, Ser: 0.79 mg/dL (ref 0.40–1.20)
GFR: 71.54 mL/min (ref >60.00)
Glucose, Bld: 108 mg/dL — ABNORMAL HIGH (ref 70–99)
Potassium: 4.4 mEq/L (ref 3.5–5.1)
Sodium: 141 mEq/L (ref 135–145)
Total Bilirubin: 0.4 mg/dL (ref 0.2–1.2)
Total Protein: 6.7 g/dL (ref 6.0–8.3)

## 2018-08-18 LAB — LIPID PANEL
Cholesterol: 192 mg/dL (ref 0–200)
HDL: 60.7 mg/dL (ref >39.00)
NonHDL: 131.32
Total CHOL/HDL Ratio: 3
Triglycerides: 220 mg/dL — ABNORMAL HIGH (ref 0.0–149.0)
VLDL: 44 mg/dL — ABNORMAL HIGH (ref 0.0–40.0)

## 2018-08-18 LAB — TSH: TSH: 5.01 u[IU]/mL — ABNORMAL HIGH (ref 0.35–4.50)

## 2018-08-18 LAB — HEMOGLOBIN A1C: Hgb A1c MFr Bld: 6.2 % (ref 4.6–6.5)

## 2018-08-18 LAB — LDL CHOLESTEROL, DIRECT: Direct LDL: 99 mg/dL

## 2018-08-18 LAB — URIC ACID: Uric Acid, Serum: 6 mg/dL (ref 2.4–7.0)

## 2018-08-18 MED ORDER — ATORVASTATIN CALCIUM 20 MG PO TABS: 20.0000 mg | ORAL_TABLET | Freq: Every day | ORAL | 1 refills | Status: DC

## 2018-08-18 MED ORDER — ALLOPURINOL 300 MG PO TABS: 300.0000 mg | ORAL_TABLET | Freq: Every day | ORAL | 1 refills | Status: DC

## 2018-08-18 NOTE — Assessment & Plan Note (Signed)
She often has difficulty getting up from a sitting position, which I discussed is likely multifactorial Her weight is part of the reason, she is physically deconditioned and arthritis is also likely contributing Discussed PT-she deferred because it is not close to where she lives Discussed trying to do leg exercises and move around more Stressed the importance of weight loss Discussed if any one joint is causing an issue she should see orthopedics for further evaluation and treatment

## 2018-08-18 NOTE — Assessment & Plan Note (Signed)
Blood pressure elevated here today, but seems to be controlled overall We will continue current medications Stressed the importance of increasing her activity and working on weight loss Low-sodium diet Continue current medications at current doses

## 2018-08-18 NOTE — Assessment & Plan Note (Signed)
Check a1c Low sugar / carb diet Stressed regular exercise Stressed weight loss

## 2018-08-18 NOTE — Assessment & Plan Note (Signed)
Has gained weight, but this is likely not related to her thyroid Clinically seems euthyroid Check TSH and will titrate medication dose if necessary

## 2018-08-18 NOTE — Assessment & Plan Note (Signed)
Following with pulmonary Uses her inhaler daily Has chronic shortness of breath-unchanged

## 2018-08-18 NOTE — Assessment & Plan Note (Signed)
Has occasional pain in her foot and thinks it may be from the gout Taking 200 mg of allopurinol daily-occasionally takes an extra pill, which seems to help We will check uric acid level Will increase allopurinol 300 mg daily

## 2018-08-18 NOTE — Assessment & Plan Note (Signed)
Has intermittent GERD symptoms-not daily Takes over-the-counter medication as needed Okay to continue

## 2018-08-18 NOTE — Assessment & Plan Note (Signed)
>>  ASSESSMENT AND PLAN FOR PULMONARY EMPHYSEMA ([facility]) WRITTEN ON 08/18/2018 10:42 AM BY BURNS, GLADE PARAS, MD  Following with pulmonary Uses her inhaler daily Has chronic shortness of breath-unchanged

## 2018-08-18 NOTE — Assessment & Plan Note (Signed)
Continue statin Check lipid panel, CMP 

## 2018-08-18 NOTE — Assessment & Plan Note (Signed)
Stressed the importance of weight loss for multiple reasons Advised that she needs to start doing exercising at home-discussed physical therapy, but she deferred because it is not easy to get to Discussed that she needs to make changes in her diet-she cannot be eating [place] fries

## 2018-08-19 ENCOUNTER — Other Ambulatory Visit: Payer: Self-pay | Admitting: Internal Medicine

## 2018-08-19 ENCOUNTER — Encounter: Payer: Self-pay | Admitting: Internal Medicine

## 2018-08-19 MED ORDER — LEVOTHYROXINE SODIUM 50 MCG PO TABS: 50.0000 ug | ORAL_TABLET | Freq: Every day | ORAL | 3 refills | Status: DC

## 2018-08-27 ENCOUNTER — Other Ambulatory Visit: Payer: Self-pay | Admitting: Cardiology

## 2018-08-27 DIAGNOSIS — I10 Essential (primary) hypertension: Secondary | ICD-10-CM

## 2018-09-11 ENCOUNTER — Telehealth: Payer: Self-pay | Admitting: Internal Medicine

## 2018-09-11 NOTE — Telephone Encounter (Signed)
Forms has been completed &placed in providers box to review & sign if she approves.

## 2018-09-11 NOTE — Telephone Encounter (Signed)
Patient's daughter dropped off an Application for Renewal of Disability Parking Placard to be completed by Dr [name]. Please contact daughter Suanne Marker) at [phone] when ready to be picked up. Placed in Brittany's box for completion.

## 2018-09-12 NOTE — Telephone Encounter (Signed)
Form signed, copy sent to scan.  Informed it is ready to be picked up. Will come tomorrow 7/1.

## 2018-11-09 ENCOUNTER — Ambulatory Visit (INDEPENDENT_AMBULATORY_CARE_PROVIDER_SITE_OTHER): Payer: Medicare Other

## 2018-11-09 ENCOUNTER — Other Ambulatory Visit: Payer: Self-pay

## 2018-11-09 DIAGNOSIS — Z23 Encounter for immunization: Secondary | ICD-10-CM

## 2018-11-21 ENCOUNTER — Ambulatory Visit (INDEPENDENT_AMBULATORY_CARE_PROVIDER_SITE_OTHER)
Admission: RE | Admit: 2018-11-21 | Discharge: 2018-11-21 | Disposition: A | Discharge status: Home / Self Care | Payer: Medicare Other | Source: Ambulatory Visit | Attending: Acute Care | Admitting: Acute Care

## 2018-11-21 ENCOUNTER — Other Ambulatory Visit: Payer: Self-pay

## 2018-11-21 DIAGNOSIS — Z87891 Personal history of nicotine dependence: Secondary | ICD-10-CM | POA: Diagnosis not present

## 2018-11-21 DIAGNOSIS — Z122 Encounter for screening for malignant neoplasm of respiratory organs: Secondary | ICD-10-CM

## 2018-11-23 DIAGNOSIS — I10 Essential (primary) hypertension: Secondary | ICD-10-CM

## 2018-11-23 MED ORDER — CARVEDILOL 12.5 MG PO TABS: 18.7500 mg | ORAL_TABLET | Freq: Two times a day (BID) | ORAL | 3 refills | Status: DC

## 2018-11-24 ENCOUNTER — Other Ambulatory Visit: Payer: Self-pay | Admitting: *Deleted

## 2018-11-24 DIAGNOSIS — Z87891 Personal history of nicotine dependence: Secondary | ICD-10-CM

## 2018-11-24 DIAGNOSIS — Z122 Encounter for screening for malignant neoplasm of respiratory organs: Secondary | ICD-10-CM

## 2019-02-18 NOTE — Patient Instructions (Addendum)
  Tests ordered today. Your results will be released to [place] (or called to you) after review.  If any changes need to be made, you will be notified at that same time.    Medications reviewed and updated.  Changes include :   None  Your prescription(s) have been submitted to your pharmacy. Please take as directed and contact our office if you believe you are having problem(s) with the medication(s).    Please followup in 6 months

## 2019-02-18 NOTE — Progress Notes (Signed)
Subjective:    Patient ID: April Combs, female    DOB: [birth year], 73 y.o.   MRN: [id number]  HPI The patient is here for follow up.  She is not exercising regularly.     She has some red spots show up on her lower legs.  She noticed them three months ago.  They did not itch and are not painful.  Some days they look redder than usual.  She has several of them on both legs.  Today they are not too red.     Hypertension, leg edema: She is taking her medication daily. She is compliant with a low sodium diet.  She denies chest pain.  She does not monitor her blood pressure at home.    Hyperlipidemia: She is taking her medication daily. She is compliant with a low fat/cholesterol diet. She denies myalgias.   Prediabetes:  She is compliant with a low sugar/carbohydrate diet.  She is exercising regularly.  Gout:  She takes allopurinol daily.  She denies gout flares since her last visit.   SOB, mod-severe COPD:  She uses her inhaler daily.  She sees pulm.  Her SOB is stable.   GERD:  She is taking her medication as needed.  She denies any GERD symptoms and feels her GERD is well controlled.   Hypothyroidism:  She is taking her medication daily.  She denies any recent changes in energy or weight that are unexplained.     Medications and allergies reviewed with patient and updated if appropriate.  Patient Active Problem List   Diagnosis Date Noted  . Physical deconditioning 08/18/2018  . Bilateral leg weakness 02/15/2018  . Prediabetes 08/10/2017  . Hypertensive 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 ([place]) 04/26/2017  . Gout 04/10/2017  . Hypothyroidism 02/09/2017  . Morbid obesity ([place]) 10/15/2015  . Dyspnea and respiratory abnormality 04/29/2015  . Pulmonary emphysema ([place]) 04/29/2015  . BRBPR (bright red blood per rectum) 02/05/2015  . Lumbar radiculopathy 12/24/2014  . Hx of tobacco use, presenting hazards to health 09/29/2014  . Nail fungus  03/11/2014  . FALSE POSITIVE NUCLEAR STRESS CHEST 01/28/2014  . Chest pain on exertion 01/14/2014  . GERD (gastroesophageal reflux disease) 05/15/2012  . Positional vertigo 12/22/2011  . Irregular heart beat 09/29/2011  . Fungal dermatitis 09/29/2011  . Hyperlipidemia with target LDL less than 100 09/29/2011  . SOB (shortness of breath) 08/23/2011  . HYPERTRIGLYCERIDEMIA 07/12/2008  . Essential hypertension 05/28/2008  . OSTEOARTHRITIS 05/28/2008    Current Outpatient Medications on File Prior to Visit  Medication Sig Dispense Refill  . carvedilol (COREG) 12.5 MG tablet Take 1.5 tablets (18.75 mg total) by mouth 2 (two) times daily with a meal. 270 tablet 3  . furosemide (LASIX) 20 MG tablet TAKE 1 TABLET BY MOUTH ONCE DAILY AS NEEDED 90 tablet 3  . levothyroxine (SYNTHROID) 50 MCG tablet Take 1 tablet (50 mcg total) by mouth daily. 90 tablet 3  . lisinopril-hydrochlorothiazide (ZESTORETIC) 20-25 MG tablet Take 1 tablet by mouth once daily 90 tablet 3  . nitroGLYCERIN (NITROSTAT) 0.4 MG SL tablet DISSOLVE ONE TABLET UNDER THE TONGUE EVERY 5 MINUTES AS NEEDED FOR CHEST PAIN.  DO NOT EXCEED A TOTAL OF 3 DOSES IN 15 MINUTES 25 tablet 0   No current facility-administered medications on file prior to visit.     Past Medical History:  Diagnosis Date  . Anemia   . Anxiety   . Coronary artery disease (CAD) excluded 01/2014  Low Risk Myoview (false positive) with possible inferior-inferolateral ischemia, but with nonsustained VT --> CATH --> Angiographically Normal Coronary Arteries; coronary CT angiogram showed mild approximately disease but otherwise no obstructive disease.  Coronary calcium score 40.  . Depression   . GERD (gastroesophageal reflux disease)   . Hyperlipidemia   . Hyperplastic colon polyp   . Hypertension   . NICM (nonischemic cardiomyopathy) ([place])    Essentially resolved. Echocardiogram July 2013 showed normal EF greater than 55%. Important septal motion. Normal LV  filling pressures. Mild MR and mild anterior MVP  . Obesity (BMI 30-39.9)   . Osteoarthritis   . Positional vertigo     Past Surgical History:  Procedure Laterality Date  . ABDOMINAL HYSTERECTOMY    . APPENDECTOMY  as child  . BREAST BIOPSY Right    benign  . CHOLECYSTECTOMY    . COLONOSCOPY    . CORONARY CALCIUM SCORE/CT ANGIOGRAM  07/2017   Calcium score 40.  Mild LAD stenosis, but no other significant disease.  Jodelle Gross ECHOCARDIOGRAPHY  July 2013   09/20/11. normal Lv thickness and function with EF greater thatn 55%  . Exercise tolerance test - CPET-MET-TEST  July 2013   Submaximal effort. Only 80% of her, therefore peak VO2 of 75% is likely an underestimate. High resting heart rate, achieving 86% of heart rate. --> Unable to interpret due to lack of effort.  [name] GASTRIC BYPASS  1980  . GASTRIC BYPASS    . KIDNEY STONE SURGERY    . LEFT HEART CATHETERIZATION WITH CORONARY ANGIOGRAM N/A 02/01/2014   Procedure: LEFT HEART CATHETERIZATION WITH CORONARY ANGIOGRAM;  Surgeon: Leonie Man, MD;  Location: [facility] CATH LAB;  Service: Cardiovascular: Angiographically normal coronaries  . NM MYOVIEW LTD  November 2015   4:20 min, 4.6 METS --> DOE, but no chest discomfort; Significant + ST -T wave changes noted with NSVT & PVCs. Images suggest Inferior-inferolateral ischemia.  Low Risk. -- FALSE POSITIVE  . Pulmonary Function Tests  July 2013   Increased densities, decreased FVC - consistent with obesity hypoventilation; FEV1 is 58%, FVC 60% of predicted.  . TRANSTHORACIC ECHOCARDIOGRAM  04/2017   Mildly reduced EF of 45%.  No regional wall motion normality.  GR 1 DD  . UPPER GASTROINTESTINAL ENDOSCOPY      Social History   Socioeconomic History  . Marital status: Married    Spouse name: Not on file  . Number of children: 3  . Years of education: Not on file  . Highest education level: Not on file  Occupational History  . Occupation: Retired  Scientific laboratory technician  . Financial resource  strain: Not on file  . Food insecurity    Worry: Not on file    Inability: Not on file  . Transportation needs    Medical: Not on file    Non-medical: Not on file  Tobacco Use  . Smoking status: Former Smoker    Packs/day: 1.00    Years: 30.00    Pack years: 30.00    Quit date: 01/18/2003    Years since quitting: 16.0  . Smokeless tobacco: Never Used  Substance and Sexual Activity  . Alcohol use: Yes    Alcohol/week: 3.0 standard drinks    Types: 3 Cans of beer per week    Comment: drinks 2-3 beers a week  . Drug use: No  . Sexual activity: Not on file  Lifestyle  . Physical activity    Days per week: Not on file  Minutes per session: Not on file  . Stress: Not on file  Relationships  . Social Herbalist on phone: Not on file    Gets together: Not on file    Attends religious service: Not on file    Active member of club or organization: Not on file    Attends meetings of clubs or organizations: Not on file    Relationship status: Not on file  Other Topics Concern  . Not on file  Social History Narrative   She is married to [name], also patient of Dr. Ellyn Hack.   Mother of 3, grandmother 94.   Quit smoking in 2005. Social alcohol.   No routine exercise.    Family History  Problem Relation Age of Onset  . Kidney disease Daughter   . Multiple sclerosis Daughter   . Heart disease Father   . Heart disease Mother   . Thyroid disease Mother   . Heart disease Paternal Grandfather   . Colon cancer Other        early 1's.  Genetic testing from maternal side of family.  . Colon cancer Maternal Aunt   . Breast cancer Maternal Aunt   . Lung cancer Sister        smoker  . Diabetes Sister   . Colon cancer Maternal Uncle   . Diabetes Daughter        x3  . Heart disease Maternal Aunt   . Colon polyps Neg Hx   . Esophageal cancer Neg Hx   . Gallbladder disease Neg Hx   . Rectal cancer Neg Hx   . Stomach cancer Neg Hx     Review of Systems   Constitutional: Negative for chills and fever.  Respiratory: Positive for shortness of breath (with exertion - chronic, no change). Negative for cough and wheezing.   Cardiovascular: Positive for palpitations. Negative for chest pain and leg swelling.  Neurological: Positive for headaches. Negative for dizziness and light-headedness.       Objective:   Vitals:   02/19/19 0821  BP: (!) 182/102  Pulse: 90  Resp: 18  Temp: 97.9 F (36.6 C)  SpO2: 97%   BP Readings from Last 3 Encounters:  02/19/19 (!) 182/102  08/18/18 (!) 150/82  08/10/18 136/84   Wt Readings from Last 3 Encounters:  02/19/19 258 lb (117 kg)  08/18/18 274 lb 12.8 oz (124.6 kg)  08/10/18 276 lb (125.2 kg)   Body mass index is 42.93 kg/m.   Physical Exam    Constitutional: Appears well-developed and well-nourished. No distress.  HENT:  Head: Normocephalic and atraumatic.  Neck: Neck supple. No tracheal deviation present. No thyromegaly present.  No cervical lymphadenopathy Cardiovascular: Normal rate, regular rhythm and normal heart sounds.   No murmur heard. No carotid bruit .  No edema Pulmonary/Chest: Effort normal and breath sounds normal. No respiratory distress. No has no wheezes. No rales.  Skin: Skin is warm and dry. Not diaphoretic.  Psychiatric: Normal mood and affect. Behavior is normal.      Assessment & Plan:    See Problem List for Assessment and Plan of chronic medical problems.     This visit occurred during the SARS-CoV-2 public health emergency.  Safety protocols were in place, including screening questions prior to the visit, additional usage of staff PPE, and extensive cleaning of exam room while observing appropriate contact time as indicated for disinfecting solutions.   FU in 6 months, sooner if needed especially if BP  high

## 2019-02-19 ENCOUNTER — Ambulatory Visit (INDEPENDENT_AMBULATORY_CARE_PROVIDER_SITE_OTHER): Payer: Medicare Other | Admitting: Internal Medicine

## 2019-02-19 ENCOUNTER — Encounter: Payer: Self-pay | Admitting: Internal Medicine

## 2019-02-19 ENCOUNTER — Other Ambulatory Visit: Payer: Self-pay

## 2019-02-19 ENCOUNTER — Other Ambulatory Visit (INDEPENDENT_AMBULATORY_CARE_PROVIDER_SITE_OTHER): Payer: Medicare Other

## 2019-02-19 VITALS — BP 184/108 | HR 90 | Temp 97.9°F | Resp 18 | Ht 65.0 in | Wt 258.0 lb

## 2019-02-19 DIAGNOSIS — E039 Hypothyroidism, unspecified: Secondary | ICD-10-CM

## 2019-02-19 DIAGNOSIS — M109 Gout, unspecified: Secondary | ICD-10-CM | POA: Diagnosis not present

## 2019-02-19 DIAGNOSIS — R7303 Prediabetes: Secondary | ICD-10-CM | POA: Diagnosis not present

## 2019-02-19 DIAGNOSIS — I10 Essential (primary) hypertension: Secondary | ICD-10-CM

## 2019-02-19 DIAGNOSIS — E785 Hyperlipidemia, unspecified: Secondary | ICD-10-CM

## 2019-02-19 DIAGNOSIS — J439 Emphysema, unspecified: Secondary | ICD-10-CM

## 2019-02-19 DIAGNOSIS — K219 Gastro-esophageal reflux disease without esophagitis: Secondary | ICD-10-CM

## 2019-02-19 LAB — CBC WITH DIFFERENTIAL/PLATELET
Basophils Absolute: 0.1 10*3/uL (ref 0.0–0.1)
Basophils Relative: 1.3 % (ref 0.0–3.0)
Eosinophils Absolute: 0.3 10*3/uL (ref 0.0–0.7)
Eosinophils Relative: 6.6 % — ABNORMAL HIGH (ref 0.0–5.0)
HCT: 39 % (ref 36.0–46.0)
Hemoglobin: 12.9 g/dL (ref 12.0–15.0)
Lymphocytes Relative: 29.1 % (ref 12.0–46.0)
Lymphs Abs: 1.5 10*3/uL (ref 0.7–4.0)
MCHC: 33 g/dL (ref 30.0–36.0)
MCV: 98.8 fl (ref 78.0–100.0)
Monocytes Absolute: 0.4 10*3/uL (ref 0.1–1.0)
Monocytes Relative: 7.9 % (ref 3.0–12.0)
Neutro Abs: 2.9 10*3/uL (ref 1.4–7.7)
Neutrophils Relative %: 55.1 % (ref 43.0–77.0)
Platelets: 181 10*3/uL (ref 150.0–400.0)
RBC: 3.95 Mil/uL (ref 3.87–5.11)
RDW: 15.2 % (ref 11.5–15.5)
WBC: 5.2 10*3/uL (ref 4.0–10.5)

## 2019-02-19 LAB — COMPREHENSIVE METABOLIC PANEL
ALT: 15 U/L (ref 0–35)
AST: 18 U/L (ref 0–37)
Albumin: 4.3 g/dL (ref 3.5–5.2)
Alkaline Phosphatase: 84 U/L (ref 39–117)
BUN: 25 mg/dL — ABNORMAL HIGH (ref 6–23)
CO2: 24 mEq/L (ref 19–32)
Calcium: 9.2 mg/dL (ref 8.4–10.5)
Chloride: 105 mEq/L (ref 96–112)
Creatinine, Ser: 0.97 mg/dL (ref 0.40–1.20)
GFR: 56.37 mL/min — ABNORMAL LOW (ref >60.00)
Glucose, Bld: 109 mg/dL — ABNORMAL HIGH (ref 70–99)
Potassium: 4.2 mEq/L (ref 3.5–5.1)
Sodium: 140 mEq/L (ref 135–145)
Total Bilirubin: 0.4 mg/dL (ref 0.2–1.2)
Total Protein: 6.9 g/dL (ref 6.0–8.3)

## 2019-02-19 LAB — LIPID PANEL
Cholesterol: 166 mg/dL (ref 0–200)
HDL: 49.5 mg/dL (ref >39.00)
NonHDL: 116.16
Total CHOL/HDL Ratio: 3
Triglycerides: 263 mg/dL — ABNORMAL HIGH (ref 0.0–149.0)
VLDL: 52.6 mg/dL — ABNORMAL HIGH (ref 0.0–40.0)

## 2019-02-19 LAB — URIC ACID: Uric Acid, Serum: 5.2 mg/dL (ref 2.4–7.0)

## 2019-02-19 LAB — LDL CHOLESTEROL, DIRECT: Direct LDL: 74 mg/dL

## 2019-02-19 LAB — HEMOGLOBIN A1C: Hgb A1c MFr Bld: 6 % (ref 4.6–6.5)

## 2019-02-19 LAB — TSH: TSH: 1.96 u[IU]/mL (ref 0.35–4.50)

## 2019-02-19 MED ORDER — OMEPRAZOLE 20 MG PO CPDR: 20.0000 mg | DELAYED_RELEASE_CAPSULE | Freq: Every day | ORAL | 3 refills | Status: DC

## 2019-02-19 MED ORDER — ATORVASTATIN CALCIUM 20 MG PO TABS: 20.0000 mg | ORAL_TABLET | Freq: Every day | ORAL | 1 refills | Status: DC

## 2019-02-19 MED ORDER — ALLOPURINOL 300 MG PO TABS: 300.0000 mg | ORAL_TABLET | Freq: Every day | ORAL | 1 refills | Status: DC

## 2019-02-19 NOTE — Assessment & Plan Note (Signed)
>>  ASSESSMENT AND PLAN FOR PULMONARY EMPHYSEMA ([facility]) WRITTEN ON 02/19/2019  8:42 AM BY BURNS, GLADE PARAS, MD  Following with pulm Chronic SOB - unchanged Can not afford trelegy - working with pulm to get an alternative

## 2019-02-19 NOTE — Assessment & Plan Note (Signed)
Very high here today initially- her husband died in 2022/10/02 and she is upset, which may be contributing, but most likely her BP is not controlled Encouraged her to check BP at home cmp

## 2019-02-19 NOTE — Assessment & Plan Note (Signed)
No gout flares Continue allopurinol 300 mg daily

## 2019-02-19 NOTE — Assessment & Plan Note (Signed)
Clinically euthyroid Check tsh  Titrate med dose if needed  

## 2019-02-19 NOTE — Assessment & Plan Note (Signed)
Following with pulm Chronic SOB - unchanged Can not afford trelegy - working with pulm to get an alternative

## 2019-02-19 NOTE — Assessment & Plan Note (Signed)
Taking omeprazole prn May need to take this regularly - rx sent to pharmacy

## 2019-02-19 NOTE — Assessment & Plan Note (Signed)
Check a1c Low sugar / carb diet Stressed regular exercise   

## 2019-02-19 NOTE — Assessment & Plan Note (Addendum)
Check lipid panel  Continue daily statin  

## 2019-02-20 ENCOUNTER — Encounter: Payer: Self-pay | Admitting: Internal Medicine

## 2019-03-01 ENCOUNTER — Telehealth: Payer: Self-pay | Admitting: *Deleted

## 2019-03-01 NOTE — Telephone Encounter (Signed)
Virtual Visit Pre-Appointment Phone Call  "(Name), I am calling you today to discuss your upcoming appointment. We are currently trying to limit exposure to the virus that causes COVID-19 by seeing patients at home rather than in the office."  1. "What is the BEST phone number to call the day of the visit?" - include this in appointment notes  2. "Do you have or have access to (through a family member/friend) a smartphone with video capability that we can use for your visit?" a. If yes - list this number in appt notes as "cell" (if different from BEST phone #) and list the appointment type as a VIDEO visit in appointment notes b. If no - list the appointment type as a PHONE visit in appointment notes  3. Confirm consent - "In the setting of the current Covid19 crisis, you are scheduled for a (phone) visit with your provider on (Friday, December 18) at (9:00 am).  Just as we do with many in-office visits, in order for you to participate in this visit, we must obtain consent.  If you'd like, I can send this to your mychart (if signed up) or email for you to review.  Otherwise, I can obtain your verbal consent now.  All virtual visits are billed to your insurance company just like a normal visit would be.  By agreeing to a virtual visit, we'd like you to understand that the technology does not allow for your provider to perform an examination, and thus may limit your provider's ability to fully assess your condition. If your provider identifies any concerns that need to be evaluated in person, we will make arrangements to do so.  Finally, though the technology is pretty good, we cannot assure that it will always work on either your or our end, and in the setting of a video visit, we may have to convert it to a phone-only visit.  In either situation, we cannot ensure that we have a secure connection.  Are you willing to proceed?" STAFF: Did the patient verbally acknowledge consent to telehealth visit?  Document YES/NO here: YES  4. Advise patient to be prepared - "Two hours prior to your appointment, go ahead and check your blood pressure, pulse, oxygen saturation, and your weight (if you have the equipment to check those) and write them all down. When your visit starts, your provider will ask you for this information. If you have an Apple Watch or Kardia device, please plan to have heart rate information ready on the day of your appointment. Please have a pen and paper handy nearby the day of the visit as well."  5. Give patient instructions for MyChart download to smartphone OR Doximity/Doxy.me as below if video visit (depending on what platform provider is using)  6. Inform patient they will receive a phone call 15 minutes prior to their appointment time (may be from unknown caller ID) so they should be prepared to answer    TELEPHONE CALL NOTE  April Combs has been deemed a candidate for a follow-up tele-health visit to limit community exposure during the Covid-19 pandemic. I spoke with the patient via phone to ensure availability of phone/video source, confirm preferred email & phone number, and discuss instructions and expectations.  I reminded April Combs to be prepared with any vital sign and/or heart rhythm information that could potentially be obtained via home monitoring, at the time of her visit. I reminded April Combs to expect a phone call prior  to her visit.  Deion Swift Avanell Shackleton 03/01/2019 11:37 AM   INSTRUCTIONS FOR DOWNLOADING THE MYCHART APP TO SMARTPHONE  - The patient must first make sure to have activated MyChart and know their login information - If Apple, go to [employer] and type in MyChart in the search bar and download the app. If Android, ask patient to go to [employer] and type in Ollie in the search bar and download the app. The app is free but as with any other app downloads, their phone may require them to verify saved payment  information or Apple/Android password.  - The patient will need to then log into the app with their MyChart username and password, and select [facility] as their healthcare provider to link the account. When it is time for your visit, go to the MyChart app, find appointments, and click Begin Video Visit. Be sure to Select Allow for your device to access the Microphone and Camera for your visit. You will then be connected, and your provider will be with you shortly.  **If they have any issues connecting, or need assistance please contact MyChart service desk [phone])**  **If using a computer, in order to ensure the best quality for their visit they will need to use either of the following Internet Browsers: [employer], or Google Chrome**  IF USING DOXIMITY or DOXY.ME - The patient will receive a link just prior to their visit by text.     FULL LENGTH CONSENT FOR TELE-HEALTH VISIT   I hereby voluntarily request, consent and authorize [facility] and its employed or contracted physicians, physician assistants, nurse practitioners or other licensed health care professionals (the Practitioner), to provide me with telemedicine health care services (the "Services") as deemed necessary by the treating Practitioner. I acknowledge and consent to receive the Services by the Practitioner via telemedicine. I understand that the telemedicine visit will involve communicating with the Practitioner through live audiovisual communication technology and the disclosure of certain medical information by electronic transmission. I acknowledge that I have been given the opportunity to request an in-person assessment or other available alternative prior to the telemedicine visit and am voluntarily participating in the telemedicine visit.  I understand that I have the right to withhold or withdraw my consent to the use of telemedicine in the course of my care at any time, without affecting my right  to future care or treatment, and that the Practitioner or I may terminate the telemedicine visit at any time. I understand that I have the right to inspect all information obtained and/or recorded in the course of the telemedicine visit and may receive copies of available information for a reasonable fee.  I understand that some of the potential risks of receiving the Services via telemedicine include:  [name] Delay or interruption in medical evaluation due to technological equipment failure or disruption; . Information transmitted may not be sufficient (e.g. poor resolution of images) to allow for appropriate medical decision making by the Practitioner; and/or  . In rare instances, security protocols could fail, causing a breach of personal health information.  Furthermore, I acknowledge that it is my responsibility to provide information about my medical history, conditions and care that is complete and accurate to the best of my ability. I acknowledge that Practitioner's advice, recommendations, and/or decision may be based on factors not within their control, such as incomplete or inaccurate data provided by me or distortions of diagnostic images or specimens that may result from electronic transmissions.  I understand that the practice of medicine is not an exact science and that Practitioner makes no warranties or guarantees regarding treatment outcomes. I acknowledge that I will receive a copy of this consent concurrently upon execution via email to the email address I last provided but may also request a printed copy by calling the office of [facility].    I understand that my insurance will be billed for this visit.   I have read or had this consent read to me. . I understand the contents of this consent, which adequately explains the benefits and risks of the Services being provided via telemedicine.  . I have been provided ample opportunity to ask questions regarding this consent and the Services  and have had my questions answered to my satisfaction. . I give my informed consent for the services to be provided through the use of telemedicine in my medical care  By participating in this telemedicine visit I agree to the above.

## 2019-03-02 ENCOUNTER — Encounter: Payer: Self-pay | Admitting: Cardiology

## 2019-03-02 ENCOUNTER — Telehealth (INDEPENDENT_AMBULATORY_CARE_PROVIDER_SITE_OTHER): Payer: Medicare Other | Admitting: Cardiology

## 2019-03-02 DIAGNOSIS — E785 Hyperlipidemia, unspecified: Secondary | ICD-10-CM

## 2019-03-02 DIAGNOSIS — I499 Cardiac arrhythmia, unspecified: Secondary | ICD-10-CM

## 2019-03-02 DIAGNOSIS — J439 Emphysema, unspecified: Secondary | ICD-10-CM

## 2019-03-02 DIAGNOSIS — I5032 Chronic diastolic (congestive) heart failure: Secondary | ICD-10-CM

## 2019-03-02 DIAGNOSIS — I10 Essential (primary) hypertension: Secondary | ICD-10-CM

## 2019-03-02 DIAGNOSIS — I110 Hypertensive heart disease with heart failure: Secondary | ICD-10-CM | POA: Diagnosis not present

## 2019-03-02 NOTE — Assessment & Plan Note (Signed)
Lipid panel just checked shows LDL of 74.  Triglycerides are high at 263.  She is on atorvastatin, may want to consider dietary adjustment first, but if triglycerides continue to be elevated could consider starting with fish oil although Vascepa is probably the most effective treatment option.

## 2019-03-02 NOTE — Assessment & Plan Note (Signed)
On combination of ACE inhibitor-HCTZ and carvedilol.  With taking carvedilol twice daily, hopefully her pressures will of improved.  She does still have some episodic hypotension. It really seems like her dyspnea is more related to pulmonary issues because she was doing much better on Trelegy and has regressed since having to stop because of cost. Her edema is pretty well stable taking Lasix and low-dose every other day.  Orthopnea is more related to her body habitus and GERD issues and not necessarily dyspnea from diastolic dysfunction.

## 2019-03-02 NOTE — Assessment & Plan Note (Signed)
The patient understands the need to lose weight with diet and exercise. We have discussed specific strategies for this.  

## 2019-03-02 NOTE — Progress Notes (Signed)
Virtual Visit via Telephone Note   This visit type was conducted due to national recommendations for restrictions regarding the COVID-19 Pandemic (e.g. social distancing) in an effort to limit this patient's exposure and mitigate transmission in our community.  Due to her co-morbid illnesses, this patient is at least at moderate risk for complications without adequate follow up.  This format is felt to be most appropriate for this patient at this time.  The patient did not have access to video technology/had technical difficulties with video requiring transitioning to audio format only (telephone).  All issues noted in this document were discussed and addressed.  No physical exam could be performed with this format.  Please refer to the patient's chart for her  consent to telehealth for [facility].   Patient has given verbal permission to conduct this visit via virtual appointment and to bill insurance 03/02/2019 2:49 PM     Evaluation Performed:  Follow-up visit  Date:  03/02/2019   ID:  April Combs, DOB 05-25-1945, MRN [id number]  Patient Location: Home Provider Location: Home  PCP:  Binnie Rail, MD  Cardiologist:  Glenetta Hew, MD  Electrophysiologist:  None   Chief Complaint:   Chief Complaint  Patient presents with  . Follow-up    Intermittent high blood pressure readings, likely related to her emotional state with it being the first Christmas without her husband.  . Hypertension    Hypertensive heart disease    History of Present Illness:    April Combs is a 73 y.o. female with PMH notable for hypertensive heart disease with chronic HFpEF who presents via audio/video conferencing for a 42-monthfollow-up telehealth visit today.  April Combs the widow of one of my long-term patients (April Combs. She has a history of non-obstructive CAD by cath back in a 2015 -  Coronary Calcium Score/CT Angiogram 07/13/2017: Calcium score 40.  Mild LAD stenosis, but no other  significant disease  She was given a diagnosis of emphysema on chest x-ray and has had profound exertional dyspnea.  April Marschnerwas last seen on Aug 10, 2018.  She was relatively stable still noted dyspnea on exertion but better with using the Trelegy prescription.  She gets short of breath going to the bathroom at night.  Noticed some more consistent edema.  Was taking Lasix every other day and occasionally 2 to 3 days in a row.  Occasional palpitations.  Hospitalizations:  . n/a   Recent CV studies:   The following studies were reviewed today: . n/a:  Inerval History   April Applenoted no significant complaints, but was just seen by her PCP and had a blood pressure in the 180/108 mmHg range.  She went home and took an extra dose of Coreg and has been doing well. Taking Lasix every other day - edema doing well. CoPay for Trelegy too much - so not as happy with breathing.  Activity involves taking out trash, doing house chores - breathing worse no that she is off of the Trelegy.  No more CP issues - did have some for the 1st month after FOwl Combs.   Still notes irregular Heart beats - usually forceful beats; spells are quick little bursts - no associated dizziness or lightheadedness.   Notes that when BP is up, she has a HA.  She otherwise really denies any chest pain or pressure with rest or exertion.  Palpitations well controlled.  Cardiovascular ROS: positive for - dyspnea on exertion and  Well-controlled swelling. negative for - chest pain, irregular heartbeat, palpitations, paroxysmal nocturnal dyspnea, rapid heart rate, shortness of breath or Syncope/near syncope, TIA/amaurosis fugax, claudication.   ROS:  Please see the history of present illness.    The patient does not have symptoms concerning for COVID-19 infection (fever, chills, cough, or new shortness of breath).  ROS  Dry cough & , shortness of breath - more than used to b/c off Trelegy, off-and-on  swelling.    Heartburn.  Gout pain.  Dizzy with standing up - fell into her dresser once last month -- NO LOC.  Now she takes her time standing up.  The patient is practicing social distancing.  Past Medical History:  Diagnosis Date  . Anemia   . Anxiety   . Coronary artery disease (CAD) excluded 01/2014   Low Risk Myoview (false positive) with possible inferior-inferolateral ischemia, but with nonsustained VT --> CATH --> Angiographically Normal Coronary Arteries; coronary CT angiogram showed mild approximately disease but otherwise no obstructive disease.  Coronary calcium score 40.  . Depression   . GERD (gastroesophageal reflux disease)   . Hyperlipidemia   . Hyperplastic colon polyp   . Hypertension   . NICM (nonischemic cardiomyopathy) ([place])    Essentially resolved. Echocardiogram July 2013 showed normal EF greater than 55%. Important septal motion. Normal LV filling pressures. Mild MR and mild anterior MVP  . Obesity (BMI 30-39.9)   . Osteoarthritis   . Positional vertigo    Past Surgical History:  Procedure Laterality Date  . ABDOMINAL HYSTERECTOMY    . APPENDECTOMY  as child  . BREAST BIOPSY Right    benign  . CHOLECYSTECTOMY    . COLONOSCOPY    . CORONARY CALCIUM SCORE/CT ANGIOGRAM  07/2017   Calcium score 40.  Mild LAD stenosis, but no other significant disease.  Jodelle Gross ECHOCARDIOGRAPHY  July 2013   09/20/11. normal Lv thickness and function with EF greater thatn 55%  . Exercise tolerance test - CPET-MET-TEST  July 2013   Submaximal effort. Only 80% of her, therefore peak VO2 of 75% is likely an underestimate. High resting heart rate, achieving 86% of heart rate. --> Unable to interpret due to lack of effort.  [name] GASTRIC BYPASS  1980  . GASTRIC BYPASS    . KIDNEY STONE SURGERY    . LEFT HEART CATHETERIZATION WITH CORONARY ANGIOGRAM N/A 02/01/2014   Procedure: LEFT HEART CATHETERIZATION WITH CORONARY ANGIOGRAM;  Surgeon: Leonie Man, MD;  Location: [facility] CATH LAB;   Service: Cardiovascular: Angiographically normal coronaries  . NM MYOVIEW LTD  November 2015   4:20 min, 4.6 METS --> DOE, but no chest discomfort; Significant + ST -T wave changes noted with NSVT & PVCs. Images suggest Inferior-inferolateral ischemia.  Low Risk. -- FALSE POSITIVE  . Pulmonary Function Tests  July 2013   Increased densities, decreased FVC - consistent with obesity hypoventilation; FEV1 is 58%, FVC 60% of predicted.  . TRANSTHORACIC ECHOCARDIOGRAM  04/2017   Mildly reduced EF of 45%.  No regional wall motion normality.  GR 1 DD  . UPPER GASTROINTESTINAL ENDOSCOPY       Current Meds  Medication Sig  . allopurinol (ZYLOPRIM) 300 MG tablet Take 1 tablet (300 mg total) by mouth daily.  [name] atorvastatin (LIPITOR) 20 MG tablet Take 1 tablet (20 mg total) by mouth daily.  . carvedilol (COREG) 12.5 MG tablet Take 1.5 tablets (18.75 mg total) by mouth 2 (two) times daily with a meal.  . furosemide (LASIX) 20 MG  tablet TAKE 1 TABLET BY MOUTH ONCE DAILY AS NEEDED  . levothyroxine (SYNTHROID) 50 MCG tablet Take 1 tablet (50 mcg total) by mouth daily.  [name] lisinopril-hydrochlorothiazide (ZESTORETIC) 20-25 MG tablet Take 1 tablet by mouth once daily  . nitroGLYCERIN (NITROSTAT) 0.4 MG SL tablet DISSOLVE ONE TABLET UNDER THE TONGUE EVERY 5 MINUTES AS NEEDED FOR CHEST PAIN.  DO NOT EXCEED A TOTAL OF 3 DOSES IN 15 MINUTES     Allergies:   Patient has no known allergies.   Social History   Tobacco Use  . Smoking status: Former Smoker    Packs/day: 1.00    Years: 30.00    Pack years: 30.00    Quit date: 01/18/2003    Years since quitting: 16.1  . Smokeless tobacco: Never Used  Substance Use Topics  . Alcohol use: Yes    Alcohol/week: 3.0 standard drinks    Types: 3 Cans of beer per week    Comment: drinks 2-3 beers a week  . Drug use: No     Family Hx: The patient's family history includes Breast cancer in her maternal aunt; Colon cancer in her maternal aunt, maternal uncle, and  another family member; Diabetes in her daughter and sister; Heart disease in her father, maternal aunt, mother, and paternal grandfather; Kidney disease in her daughter; Lung cancer in her sister; Multiple sclerosis in her daughter; Thyroid disease in her mother. There is no history of Colon polyps, Esophageal cancer, Gallbladder disease, Rectal cancer, or Stomach cancer.   Labs/Other Tests and Data Reviewed:    EKG:  No ECG reviewed.  Recent Labs: 02/19/2019: ALT 15; BUN 25; Creatinine, Ser 0.97; Hemoglobin 12.9; Platelets 181.0; Potassium 4.2; Sodium 140; TSH 1.96   Recent Lipid Panel Lab Results  Component Value Date/Time   CHOL 166 02/19/2019 08:50 AM   TRIG 263.0 (H) 02/19/2019 08:50 AM   HDL 49.50 02/19/2019 08:50 AM   CHOLHDL 3 02/19/2019 08:50 AM   LDLCALC 56 02/18/2016 09:32 AM   LDLDIRECT 74.0 02/19/2019 08:50 AM    Wt Readings from Last 3 Encounters:  03/02/19 258 lb (117 kg)  02/19/19 258 lb (117 kg)  08/18/18 274 lb 12.8 oz (124.6 kg)     Objective:    Vital Signs:  BP 140/85   Ht 5' 6"  (1.676 m)   Wt 258 lb (117 kg)   BMI 41.64 kg/m   VITAL SIGNS:  reviewed  Pleasant woman.  In no acute distress. A&O x 3.  Normal Mood & Affect Non-labored respirations   ASSESSMENT & PLAN:    Problem List Items Addressed This Visit    Morbid obesity ([place]) - Primary (Chronic)    The patient understands the need to lose weight with diet and exercise. We have discussed specific strategies for this.      Essential hypertension (Chronic)    Her blood pressure is episodically elevated.  Usually at home better than this.  She had that one episode at her PCPs office where her blood pressure was high.  My recommendation would be for her to take an additional 12.5 mg of carvedilol if it does show high blood pressure.  Otherwise would prefer to avoid adding additional medicine.  She is taking stable dose of lisinopril-HCTZ.      Irregular heart beat (Chronic)     Well-controlled on carvedilol. We could still continue to titrate up to full 25 mg twice daily which may be necessary if her pressures continue to be elevated.  Very the  palpitations or high blood pressures over 170 mmHg, she can take an additional 12.5 mg carvedilol.  She should be taking her medicine twice daily.      Hyperlipidemia with target LDL less than 100 (Chronic)    Lipid panel just checked shows LDL of 74.  Triglycerides are high at 263.  She is on atorvastatin, may want to consider dietary adjustment first, but if triglycerides continue to be elevated could consider starting with fish oil although Vascepa is probably the most effective treatment option.      Hypertensive 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 (HCC) (Chronic)    On combination of ACE inhibitor-HCTZ and carvedilol.  With taking carvedilol twice daily, hopefully her pressures will of improved.  She does still have some episodic hypotension. It really seems like her dyspnea is more related to pulmonary issues because she was doing much better on Trelegy and has regressed since having to stop because of cost. Her edema is pretty well stable taking Lasix and low-dose every other day.  Orthopnea is more related to her body habitus and GERD issues and not necessarily dyspnea from diastolic dysfunction.      Pulmonary emphysema (HCC) (Chronic)    Needs to check back in with pulmonary medicine about an option other than Trelegy.  Perhaps there is some type of financial assistance plan for her, but the cost was prohibitive.  Unfortunately this was probably the best treatment option for her from a symptom standpoint.         COVID-19 Education: The signs and symptoms of COVID-19 were discussed with the patient and how to seek care for testing (follow up with PCP or arrange E-visit).   The importance of social distancing was discussed today.  Time:   Today, I have spent 18 minutes with the patient  with telehealth technology discussing the above problems.     Medication Adjustments/Labs and Tests Ordered: Current medicines are reviewed at length with the patient today.  Concerns regarding medicines are outlined above.   Patient Instructions  Medication Instructions:  No change  *If you need a refill on your cardiac medications before your next appointment, please call your pharmacy*  Lab Work: Per PCP  If you have labs (blood work) drawn today and your tests are completely normal, you will receive your results only by: [name] MyChart Message (if you have MyChart) OR . A paper copy in the mail If you have any lab test that is abnormal or we need to change your treatment, we will call you to review the results.  Testing/Procedures: None for now  Follow-Up: At [facility], you and your health needs are our priority.  As part of our continuing mission to provide you with exceptional heart care, we have created designated Provider Care Teams.  These Care Teams include your primary Cardiologist (physician) and Advanced Practice Providers (APPs -  Physician Assistants and Nurse Practitioners) who all work together to provide you with the care you need, when you need it.  Your next appointment:   6 month(s)  The format for your next appointment:   In Person  Provider:   Glenetta Hew, MD  Other Instructions n/a    Signed, Glenetta Hew, MD  03/02/2019 2:49 PM    [place]

## 2019-03-02 NOTE — Assessment & Plan Note (Signed)
>>  ASSESSMENT AND PLAN FOR PULMONARY EMPHYSEMA (HCC) WRITTEN ON 03/02/2019  2:46 PM BY HARDING, DAVID W, MD  Needs to check back in with pulmonary medicine about an option other than Trelegy.  Perhaps there is some type of financial assistance plan for her, but the cost was prohibitive.  Unfortunately this was probably the best treatment option for her from a symptom standpoint.

## 2019-03-02 NOTE — Assessment & Plan Note (Addendum)
Well-controlled on carvedilol. We could still continue to titrate up to full 25 mg twice daily which may be necessary if her pressures continue to be elevated.  Very the palpitations or high blood pressures over 170 mmHg, she can take an additional 12.5 mg carvedilol.  She should be taking her medicine twice daily.

## 2019-03-02 NOTE — Assessment & Plan Note (Signed)
Her blood pressure is episodically elevated.  Usually at home better than this.  She had that one episode at her PCPs office where her blood pressure was high.  My recommendation would be for her to take an additional 12.5 mg of carvedilol if it does show high blood pressure.  Otherwise would prefer to avoid adding additional medicine.  She is taking stable dose of lisinopril-HCTZ.

## 2019-03-02 NOTE — Assessment & Plan Note (Signed)
Needs to check back in with pulmonary medicine about an option other than Trelegy.  Perhaps there is some type of financial assistance plan for her, but the cost was prohibitive.  Unfortunately this was probably the best treatment option for her from a symptom standpoint.

## 2019-03-02 NOTE — Patient Instructions (Signed)
Medication Instructions:  No change  *If you need a refill on your cardiac medications before your next appointment, please call your pharmacy*  Lab Work: Per PCP  If you have labs (blood work) drawn today and your tests are completely normal, you will receive your results only by: [name] MyChart Message (if you have MyChart) OR . A paper copy in the mail If you have any lab test that is abnormal or we need to change your treatment, we will call you to review the results.  Testing/Procedures: None for now  Follow-Up: At [facility], you and your health needs are our priority.  As part of our continuing mission to provide you with exceptional heart care, we have created designated Provider Care Teams.  These Care Teams include your primary Cardiologist (physician) and Advanced Practice Providers (APPs -  Physician Assistants and Nurse Practitioners) who all work together to provide you with the care you need, when you need it.  Your next appointment:   6 month(s)  The format for your next appointment:   In Person  Provider:   Glenetta Hew, MD  Other Instructions n/a

## 2019-03-26 ENCOUNTER — Other Ambulatory Visit: Payer: Self-pay | Admitting: Internal Medicine

## 2019-03-26 DIAGNOSIS — Z1231 Encounter for screening mammogram for malignant neoplasm of breast: Secondary | ICD-10-CM

## 2019-05-24 DIAGNOSIS — Z6841 Body Mass Index (BMI) 40.0 and over, adult: Secondary | ICD-10-CM | POA: Diagnosis not present

## 2019-05-24 DIAGNOSIS — J449 Chronic obstructive pulmonary disease, unspecified: Secondary | ICD-10-CM | POA: Diagnosis not present

## 2019-06-01 DIAGNOSIS — Z23 Encounter for immunization: Secondary | ICD-10-CM | POA: Diagnosis not present

## 2019-07-04 ENCOUNTER — Telehealth: Payer: Self-pay | Admitting: Internal Medicine

## 2019-07-04 ENCOUNTER — Other Ambulatory Visit: Payer: Self-pay

## 2019-07-04 ENCOUNTER — Encounter: Payer: Self-pay | Admitting: Internal Medicine

## 2019-07-04 ENCOUNTER — Ambulatory Visit (INDEPENDENT_AMBULATORY_CARE_PROVIDER_SITE_OTHER): Payer: Medicare Other | Admitting: Internal Medicine

## 2019-07-04 VITALS — BP 164/96 | HR 88 | Temp 97.8°F | Resp 16 | Ht 66.0 in | Wt 251.8 lb

## 2019-07-04 DIAGNOSIS — M5416 Radiculopathy, lumbar region: Secondary | ICD-10-CM | POA: Diagnosis not present

## 2019-07-04 MED ORDER — MELOXICAM 7.5 MG PO TABS: 7.5000 mg | ORAL_TABLET | Freq: Every day | ORAL | 0 refills | Status: DC

## 2019-07-04 MED ORDER — GABAPENTIN 100 MG PO CAPS: ORAL_CAPSULE | ORAL | 5 refills | Status: DC

## 2019-07-04 MED ORDER — HYDROCODONE-ACETAMINOPHEN 5-325 MG PO TABS: 1.0000 | ORAL_TABLET | ORAL | 0 refills | Status: DC | PRN

## 2019-07-04 MED ORDER — METHYLPREDNISOLONE ACETATE 80 MG/ML IJ SUSP
80.0000 mg | Freq: Once | INTRAMUSCULAR | Status: AC
  Administered 2019-07-04: 12:00:00 80 mg via INTRAMUSCULAR

## 2019-07-04 NOTE — Telephone Encounter (Signed)
Spoke with pharmacy in regards. Gave dx and information needed.

## 2019-07-04 NOTE — Telephone Encounter (Signed)
New Message:   April Combs is calling to get the pt's DX for this medication and to verify if the patient has been on this medication before or is this her first fill of this medication. Please advise.

## 2019-07-04 NOTE — Patient Instructions (Addendum)
Received a steroid injection today.   Take meloxicam ( anti-inflammatory) daily with food.  Do not take advil or aleve while taking this.   Take gabapentin for leg pain (nerve pain medication) - we can increase this if you tolerate it well.    Take the pain medication as needed only.     Please call if there is no improvement in your symptoms.

## 2019-07-04 NOTE — Progress Notes (Signed)
Subjective:    Patient ID: April Combs, female    DOB: 07/31/1945, 74 y.o.   MRN: [id number]  HPI The patient is here for an acute visit.  She had back pain on Friday, but it went away after taking aleve.  She has been doing more around the house, but does not recall any specific injury, but could have lifted her move something that caused the back pain.  She did feel pain in the buttock region Sunday and that went away.  The pain in her leg started Sunday, 3 days ago.  Initially the leg felt very heavy, but that turned into pain.  The pain was worse Monday, 2 days ago. Last night the pain was severe and she could not sleep.    The pain from the top of her left leg to her knee is severe and it is more of a throb from her left knee to ankle.  Her foot feels asleep.  No N/T in leg.  Leg feels weak.    She does have a history of lumbar radiculopathy and had an MRI several years ago.  At that time she had a bulging disc.  She has had an epidural in the past and states she will not have that again.  She denies changes in bowel or bladder.   Medications and allergies reviewed with patient and updated if appropriate.  Patient Active Problem List   Diagnosis Date Noted  . Physical deconditioning 08/18/2018  . Bilateral leg weakness 02/15/2018  . Prediabetes 08/10/2017  . Hypertensive 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 ([place]) 04/26/2017  . Gout 04/10/2017  . Hypothyroidism 02/09/2017  . Morbid obesity ([place]) 10/15/2015  . Dyspnea and respiratory abnormality 04/29/2015  . Pulmonary emphysema ([place]) 04/29/2015  . BRBPR (bright red blood per rectum) 02/05/2015  . Lumbar radiculopathy 12/24/2014  . Hx of tobacco use, presenting hazards to health 09/29/2014  . Nail fungus 03/11/2014  . FALSE POSITIVE NUCLEAR STRESS CHEST 01/28/2014  . Chest pain on exertion 01/14/2014  . GERD (gastroesophageal reflux disease) 05/15/2012  . Positional vertigo 12/22/2011  .  Irregular heart beat 09/29/2011  . Fungal dermatitis 09/29/2011  . Hyperlipidemia with target LDL less than 100 09/29/2011  . SOB (shortness of breath) 08/23/2011  . HYPERTRIGLYCERIDEMIA 07/12/2008  . Essential hypertension 05/28/2008  . OSTEOARTHRITIS 05/28/2008    Current Outpatient Medications on File Prior to Visit  Medication Sig Dispense Refill  . allopurinol (ZYLOPRIM) 300 MG tablet Take 1 tablet (300 mg total) by mouth daily. 90 tablet 1  . atorvastatin (LIPITOR) 20 MG tablet Take 1 tablet (20 mg total) by mouth daily. 90 tablet 1  . carvedilol (COREG) 12.5 MG tablet Take 1.5 tablets (18.75 mg total) by mouth 2 (two) times daily with a meal. 270 tablet 3  . furosemide (LASIX) 20 MG tablet TAKE 1 TABLET BY MOUTH ONCE DAILY AS NEEDED 90 tablet 3  . levothyroxine (SYNTHROID) 50 MCG tablet Take 1 tablet (50 mcg total) by mouth daily. 90 tablet 3  . lisinopril-hydrochlorothiazide (ZESTORETIC) 20-25 MG tablet Take 1 tablet by mouth once daily 90 tablet 3  . nitroGLYCERIN (NITROSTAT) 0.4 MG SL tablet DISSOLVE ONE TABLET UNDER THE TONGUE EVERY 5 MINUTES AS NEEDED FOR CHEST PAIN.  DO NOT EXCEED A TOTAL OF 3 DOSES IN 15 MINUTES 25 tablet 0  . omeprazole (PRILOSEC) 20 MG capsule Take 1 capsule (20 mg total) by mouth daily. 90 capsule 3   No current facility-administered medications  on file prior to visit.    Past Medical History:  Diagnosis Date  . Anemia   . Anxiety   . Coronary artery disease (CAD) excluded 01/2014   Low Risk Myoview (false positive) with possible inferior-inferolateral ischemia, but with nonsustained VT --> CATH --> Angiographically Normal Coronary Arteries; coronary CT angiogram showed mild approximately disease but otherwise no obstructive disease.  Coronary calcium score 40.  . Depression   . GERD (gastroesophageal reflux disease)   . Hyperlipidemia   . Hyperplastic colon polyp   . Hypertension   . NICM (nonischemic cardiomyopathy) ([place])    Essentially resolved.  Echocardiogram July 2013 showed normal EF greater than 55%. Important septal motion. Normal LV filling pressures. Mild MR and mild anterior MVP  . Obesity (BMI 30-39.9)   . Osteoarthritis   . Positional vertigo     Past Surgical History:  Procedure Laterality Date  . ABDOMINAL HYSTERECTOMY    . APPENDECTOMY  as child  . BREAST BIOPSY Right    benign  . CHOLECYSTECTOMY    . COLONOSCOPY    . CORONARY CALCIUM SCORE/CT ANGIOGRAM  07/2017   Calcium score 40.  Mild LAD stenosis, but no other significant disease.  Jodelle Gross ECHOCARDIOGRAPHY  July 2013   09/20/11. normal Lv thickness and function with EF greater thatn 55%  . Exercise tolerance test - CPET-MET-TEST  July 2013   Submaximal effort. Only 80% of her, therefore peak VO2 of 75% is likely an underestimate. High resting heart rate, achieving 86% of heart rate. --> Unable to interpret due to lack of effort.  [name] GASTRIC BYPASS  1980  . GASTRIC BYPASS    . KIDNEY STONE SURGERY    . LEFT HEART CATHETERIZATION WITH CORONARY ANGIOGRAM N/A 02/01/2014   Procedure: LEFT HEART CATHETERIZATION WITH CORONARY ANGIOGRAM;  Surgeon: Leonie Man, MD;  Location: [facility] CATH LAB;  Service: Cardiovascular: Angiographically normal coronaries  . NM MYOVIEW LTD  November 2015   4:20 min, 4.6 METS --> DOE, but no chest discomfort; Significant + ST -T wave changes noted with NSVT & PVCs. Images suggest Inferior-inferolateral ischemia.  Low Risk. -- FALSE POSITIVE  . Pulmonary Function Tests  July 2013   Increased densities, decreased FVC - consistent with obesity hypoventilation; FEV1 is 58%, FVC 60% of predicted.  . TRANSTHORACIC ECHOCARDIOGRAM  04/2017   Mildly reduced EF of 45%.  No regional wall motion normality.  GR 1 DD  . UPPER GASTROINTESTINAL ENDOSCOPY      Social History   Socioeconomic History  . Marital status: Married    Spouse name: Not on file  . Number of children: 3  . Years of education: Not on file  . Highest education level: Not on  file  Occupational History  . Occupation: Retired  Tobacco Use  . Smoking status: Former Smoker    Packs/day: 1.00    Years: 30.00    Pack years: 30.00    Quit date: 01/18/2003    Years since quitting: 16.4  . Smokeless tobacco: Never Used  Substance and Sexual Activity  . Alcohol use: Yes    Alcohol/week: 3.0 standard drinks    Types: 3 Cans of beer per week    Comment: drinks 2-3 beers a week  . Drug use: No  . Sexual activity: Not on file  Other Topics Concern  . Not on file  Social History Narrative   She is married to [name], also patient of Dr. Ellyn Hack.   Mother of 3, grandmother 62.  Quit smoking in 2005. Social alcohol.   No routine exercise.   Social Determinants of Health   Financial Resource Strain:   . Difficulty of Paying Living Expenses:   Food Insecurity:   . Worried About Charity fundraiser in the Last Year:   . Arboriculturist in the Last Year:   Transportation Needs:   . Film/video editor (Medical):   [name] Lack of Transportation (Non-Medical):   Physical Activity:   . Days of Exercise per Week:   . Minutes of Exercise per Session:   Stress:   . Feeling of Stress :   Social Connections:   . Frequency of Communication with Friends and Family:   . Frequency of Social Gatherings with Friends and Family:   . Attends Religious Services:   . Active Member of Clubs or Organizations:   . Attends Archivist Meetings:   [name] Marital Status:     Family History  Problem Relation Age of Onset  . Kidney disease Daughter   . Multiple sclerosis Daughter   . Heart disease Father   . Heart disease Mother   . Thyroid disease Mother   . Heart disease Paternal Grandfather   . Colon cancer Other        early 33's.  Genetic testing from maternal side of family.  . Colon cancer Maternal Aunt   . Breast cancer Maternal Aunt   . Lung cancer Sister        smoker  . Diabetes Sister   . Colon cancer Maternal Uncle   . Diabetes Daughter        x3    . Heart disease Maternal Aunt   . Colon polyps Neg Hx   . Esophageal cancer Neg Hx   . Gallbladder disease Neg Hx   . Rectal cancer Neg Hx   . Stomach cancer Neg Hx     Review of Systems  Constitutional: Negative for chills and fever.  Musculoskeletal:       Leg pain  Neurological: Positive for weakness and numbness.       Objective:   Vitals:   07/04/19 1026  BP: (!) 164/96  Pulse: 88  Resp: 16  Temp: 97.8 F (36.6 C)  SpO2: 99%   BP Readings from Last 3 Encounters:  07/04/19 (!) 164/96  03/02/19 140/85  02/19/19 (!) 184/108   Wt Readings from Last 3 Encounters:  07/04/19 251 lb 12.8 oz (114.2 kg)  03/02/19 258 lb (117 kg)  02/19/19 258 lb (117 kg)   Body mass index is 40.64 kg/m.   Physical Exam Constitutional:      Appearance: Normal appearance. She is not ill-appearing.     Comments: No acute distress, but appears uncomfortable secondary to pain  HENT:     Head: Normocephalic and atraumatic.  Musculoskeletal:        General: Tenderness (Lower lumbar spine, no left lower back tenderness more lateral hip tenderness) present. No swelling or deformity.     Right lower leg: No edema.     Left lower leg: No edema.  Skin:    General: Skin is warm and dry.  Neurological:     Mental Status: She is alert.     Sensory: No sensory deficit.     Motor: No weakness.     Gait: Gait abnormal (Walking with a limp).            Assessment & Plan:    See Problem List for Assessment  and Plan of chronic medical problems.   This visit occurred during the SARS-CoV-2 public health emergency.  Safety protocols were in place, including screening questions prior to the visit, additional usage of staff PPE, and extensive cleaning of exam room while observing appropriate contact time as indicated for disinfecting solutions.

## 2019-07-04 NOTE — Assessment & Plan Note (Signed)
Acute Has had a similar episode in the past, but not this severe Depo-Medrol 80 mg IM x1 Meloxicam 7.5 mg daily with food-advised not to take any Advil or Aleve while taking this Start gabapentin 100 mg in morning and 200 mg at bedtime-discussed that we can increase this if tolerated.  Discussed this can cause some drowsiness Vicodin for severe pain-she will only take this if she absolutely needs it Avoid bending, lifting and twisting She will update me how her symptoms are in a few days. Declined physical therapy and referral to orthopedics at this time

## 2019-07-05 ENCOUNTER — Encounter: Payer: Self-pay | Admitting: Internal Medicine

## 2019-07-05 DIAGNOSIS — M5416 Radiculopathy, lumbar region: Secondary | ICD-10-CM

## 2019-07-19 ENCOUNTER — Other Ambulatory Visit: Payer: Self-pay | Admitting: Sports Medicine

## 2019-07-19 DIAGNOSIS — M545 Low back pain, unspecified: Secondary | ICD-10-CM

## 2019-08-16 ENCOUNTER — Ambulatory Visit
Admission: RE | Admit: 2019-08-16 | Discharge: 2019-08-16 | Disposition: A | Discharge status: Home / Self Care | Payer: Medicare Other | Source: Ambulatory Visit | Attending: Sports Medicine | Admitting: Sports Medicine

## 2019-08-16 ENCOUNTER — Other Ambulatory Visit: Payer: Self-pay

## 2019-08-16 DIAGNOSIS — M48061 Spinal stenosis, lumbar region without neurogenic claudication: Secondary | ICD-10-CM | POA: Diagnosis not present

## 2019-08-16 DIAGNOSIS — M5126 Other intervertebral disc displacement, lumbar region: Secondary | ICD-10-CM | POA: Diagnosis not present

## 2019-08-16 DIAGNOSIS — M545 Low back pain, unspecified: Secondary | ICD-10-CM

## 2019-08-18 NOTE — Progress Notes (Signed)
Subjective:    Patient ID: April Combs, female    DOB: May 27, 1945, 74 y.o.   MRN: [id number]  HPI The patient is here for follow up of their chronic medical problems, including htn, hyperlipidemia, prediabetes, gout, GERD, hypothyroidism, mod-severe COPD.  She is taking all of her medications as prescribed.   She is not exercising regularly.     Having cramps in leg and feet at night x 3 weeks.  She drinks water during the day, maybe not as much.  She still has chronic back pain.  She had an MRI last week. The pain is back at her baseline - left lower back and left upper leg.  It is tolerable.      Medications and allergies reviewed with patient and updated if appropriate.  Patient Active Problem List   Diagnosis Date Noted  . Physical deconditioning 08/18/2018  . Bilateral leg weakness 02/15/2018  . Prediabetes 08/10/2017  . 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 Gout 04/10/2017  . Hypothyroidism 02/09/2017  . Morbid obesity ([place]) 10/15/2015  . Pulmonary emphysema ([place]) 04/29/2015  . BRBPR (bright red blood per rectum) 02/05/2015  . Lumbar radiculopathy 12/24/2014  . Hx of tobacco use, presenting hazards to health 09/29/2014  . Nail fungus 03/11/2014  . FALSE POSITIVE NUCLEAR STRESS CHEST 01/28/2014  . Chest pain on exertion 01/14/2014  . GERD (gastroesophageal reflux disease) 05/15/2012  . Positional vertigo 12/22/2011  . Irregular heart beat 09/29/2011  . Fungal dermatitis 09/29/2011  . Hyperlipidemia with target LDL less than 100 09/29/2011  . SOB (shortness of breath) 08/23/2011  . HYPERTRIGLYCERIDEMIA 07/12/2008  . Essential hypertension 05/28/2008  . OSTEOARTHRITIS 05/28/2008    Current Outpatient Medications on File Prior to Visit  Medication Sig Dispense Refill  . allopurinol (ZYLOPRIM) 300 MG tablet Take 1 tablet (300 mg total) by mouth daily. 90 tablet 1  . atorvastatin (LIPITOR) 20 MG tablet  Take 1 tablet (20 mg total) by mouth daily. 90 tablet 1  . carvedilol (COREG) 12.5 MG tablet Take 1.5 tablets (18.75 mg total) by mouth 2 (two) times daily with a meal. 270 tablet 3  . furosemide (LASIX) 20 MG tablet TAKE 1 TABLET BY MOUTH ONCE DAILY AS NEEDED 90 tablet 3  . gabapentin (NEURONTIN) 100 MG capsule Take 100 mg in morning and 200 mg at bedtime 90 capsule 5  . levothyroxine (SYNTHROID) 50 MCG tablet Take 1 tablet (50 mcg total) by mouth daily. 90 tablet 3  . lisinopril-hydrochlorothiazide (ZESTORETIC) 20-25 MG tablet Take 1 tablet by mouth once daily 90 tablet 3  . nitroGLYCERIN (NITROSTAT) 0.4 MG SL tablet DISSOLVE ONE TABLET UNDER THE TONGUE EVERY 5 MINUTES AS NEEDED FOR CHEST PAIN.  DO NOT EXCEED A TOTAL OF 3 DOSES IN 15 MINUTES 25 tablet 0  . omeprazole (PRILOSEC) 20 MG capsule Take 1 capsule (20 mg total) by mouth daily. 90 capsule 3   No current facility-administered medications on file prior to visit.    Past Medical History:  Diagnosis Date  . Anemia   . Anxiety   . Coronary artery disease (CAD) excluded 01/2014   Low Risk Myoview (false positive) with possible inferior-inferolateral ischemia, but with nonsustained VT --> CATH --> Angiographically Normal Coronary Arteries; coronary CT angiogram showed mild approximately disease but otherwise no obstructive disease.  Coronary calcium score 40.  . Depression   . GERD (gastroesophageal reflux disease)   . Hyperlipidemia   . Hyperplastic colon polyp   .  Hypertension   . NICM (nonischemic cardiomyopathy) ([place])    Essentially resolved. Echocardiogram July 2013 showed normal EF greater than 55%. Important septal motion. Normal LV filling pressures. Mild MR and mild anterior MVP  . Obesity (BMI 30-39.9)   . Osteoarthritis   . Positional vertigo     Past Surgical History:  Procedure Laterality Date  . ABDOMINAL HYSTERECTOMY    . APPENDECTOMY  as child  . BREAST BIOPSY Right    benign  . CHOLECYSTECTOMY    . COLONOSCOPY     . CORONARY CALCIUM SCORE/CT ANGIOGRAM  07/2017   Calcium score 40.  Mild LAD stenosis, but no other significant disease.  Jodelle Gross ECHOCARDIOGRAPHY  July 2013   09/20/11. normal Lv thickness and function with EF greater thatn 55%  . Exercise tolerance test - CPET-MET-TEST  July 2013   Submaximal effort. Only 80% of her, therefore peak VO2 of 75% is likely an underestimate. High resting heart rate, achieving 86% of heart rate. --> Unable to interpret due to lack of effort.  [name] GASTRIC BYPASS  1980  . GASTRIC BYPASS    . KIDNEY STONE SURGERY    . LEFT HEART CATHETERIZATION WITH CORONARY ANGIOGRAM N/A 02/01/2014   Procedure: LEFT HEART CATHETERIZATION WITH CORONARY ANGIOGRAM;  Surgeon: Leonie Man, MD;  Location[facility] CATH LAB;  Service: Cardiovascular: Angiographically normal coronaries  . NM MYOVIEW LTD  November 2015   4:20 min, 4.6 METS --> DOE, but no chest discomfort; Significant + ST -T wave changes noted with NSVT & PVCs. Images suggest Inferior-inferolateral ischemia.  Low Risk. -- FALSE POSITIVE  . Pulmonary Function Tests  July 2013   Increased densities, decreased FVC - consistent with obesity hypoventilation; FEV1 is 58%, FVC 60% of predicted.  . TRANSTHORACIC ECHOCARDIOGRAM  04/2017   Mildly reduced EF of 45%.  No regional wall motion normality.  GR 1 DD  . UPPER GASTROINTESTINAL ENDOSCOPY      Social History   Socioeconomic History  . Marital status: Married    Spouse name: Not on file  . Number of children: 3  . Years of education: Not on file  . Highest education level: Not on file  Occupational History  . Occupation: Retired  Tobacco Use  . Smoking status: Former Smoker    Packs/day: 1.00    Years: 30.00    Pack years: 30.00    Quit date: 01/18/2003    Years since quitting: 16.5  . Smokeless tobacco: Never Used  Substance and Sexual Activity  . Alcohol use: Yes    Alcohol/week: 3.0 standard drinks    Types: 3 Cans of beer per week    Comment: drinks 2-3  beers a week  . Drug use: No  . Sexual activity: Not on file  Other Topics Concern  . Not on file  Social History Narrative   She is married to [name], also patient of Dr. Ellyn Hack.   Mother of 3, grandmother 46.   Quit smoking in 2005. Social alcohol.   No routine exercise.   Social Determinants of Health   Financial Resource Strain:   . Difficulty of Paying Living Expenses:   Food Insecurity:   . Worried About Charity fundraiser in the Last Year:   . Arboriculturist in the Last Year:   Transportation Needs:   . Film/video editor (Medical):   [name] Lack of Transportation (Non-Medical):   Physical Activity:   . Days of Exercise per Week:   .  Minutes of Exercise per Session:   Stress:   . Feeling of Stress :   Social Connections:   . Frequency of Communication with Friends and Family:   . Frequency of Social Gatherings with Friends and Family:   . Attends Religious Services:   . Active Member of Clubs or Organizations:   . Attends Archivist Meetings:   [name] Marital Status:     Family History  Problem Relation Age of Onset  . Kidney disease Daughter   . Multiple sclerosis Daughter   . Heart disease Father   . Heart disease Mother   . Thyroid disease Mother   . Heart disease Paternal Grandfather   . Colon cancer Other        early 62's.  Genetic testing from maternal side of family.  . Colon cancer Maternal Aunt   . Breast cancer Maternal Aunt   . Lung cancer Sister        smoker  . Diabetes Sister   . Colon cancer Maternal Uncle   . Diabetes Daughter        x3  . Heart disease Maternal Aunt   . Colon polyps Neg Hx   . Esophageal cancer Neg Hx   . Gallbladder disease Neg Hx   . Rectal cancer Neg Hx   . Stomach cancer Neg Hx     Review of Systems  Constitutional: Negative for chills and fever.  Respiratory: Positive for cough (related to certain scents) and shortness of breath (walking up incline). Negative for wheezing.   Cardiovascular:  Positive for palpitations. Negative for chest pain and leg swelling.  Neurological: Positive for headaches (occ). Negative for light-headedness.       Objective:   Vitals:   08/20/19 1237  BP: 122/60  Pulse: 61  Resp: 16   BP Readings from Last 3 Encounters:  08/20/19 122/60  08/20/19 122/60  07/04/19 (!) 164/96   Wt Readings from Last 3 Encounters:  08/20/19 249 lb (112.9 kg)  08/20/19 249 lb 3.2 oz (113 kg)  07/04/19 251 lb 12.8 oz (114.2 kg)   Body mass index is 40.19 kg/m.   Physical Exam    Constitutional: Appears well-developed and well-nourished. No distress.  HENT:  Head: Normocephalic and atraumatic.  Neck: Neck supple. No tracheal deviation present. No thyromegaly present.  No cervical lymphadenopathy Cardiovascular: Normal rate, regular rhythm and normal heart sounds.   No murmur heard. No carotid bruit .  No edema Pulmonary/Chest: Effort normal and breath sounds normal. No respiratory distress. No has no wheezes. No rales.  Skin: Skin is warm and dry. Not diaphoretic.  Psychiatric: Normal mood and affect. Behavior is normal.      Assessment & Plan:    See Problem List for Assessment and Plan of chronic medical problems.    This visit occurred during the SARS-CoV-2 public health emergency.  Safety protocols were in place, including screening questions prior to the visit, additional usage of staff PPE, and extensive cleaning of exam room while observing appropriate contact time as indicated for disinfecting solutions.

## 2019-08-18 NOTE — Patient Instructions (Addendum)
  Blood work was ordered.   ° ° °Medications reviewed and updated.  Changes include :   none ° ° ° °Please followup in 6 months ° ° °

## 2019-08-20 ENCOUNTER — Other Ambulatory Visit: Payer: Self-pay

## 2019-08-20 ENCOUNTER — Encounter: Payer: Self-pay | Admitting: Internal Medicine

## 2019-08-20 ENCOUNTER — Ambulatory Visit (INDEPENDENT_AMBULATORY_CARE_PROVIDER_SITE_OTHER): Payer: Medicare Other | Admitting: Internal Medicine

## 2019-08-20 ENCOUNTER — Ambulatory Visit: Payer: Medicare Other | Admitting: Cardiology

## 2019-08-20 ENCOUNTER — Ambulatory Visit (INDEPENDENT_AMBULATORY_CARE_PROVIDER_SITE_OTHER): Payer: Medicare Other

## 2019-08-20 ENCOUNTER — Other Ambulatory Visit: Payer: Self-pay | Admitting: Cardiology

## 2019-08-20 VITALS — BP 122/60 | HR 61 | Resp 16 | Wt 249.0 lb

## 2019-08-20 VITALS — BP 122/60 | HR 61 | Temp 98.0°F | Resp 16 | Ht 66.0 in | Wt 249.2 lb

## 2019-08-20 DIAGNOSIS — Z0000 Encounter for general adult medical examination without abnormal findings: Secondary | ICD-10-CM | POA: Diagnosis not present

## 2019-08-20 DIAGNOSIS — R7303 Prediabetes: Secondary | ICD-10-CM

## 2019-08-20 DIAGNOSIS — I10 Essential (primary) hypertension: Secondary | ICD-10-CM

## 2019-08-20 DIAGNOSIS — J439 Emphysema, unspecified: Secondary | ICD-10-CM

## 2019-08-20 DIAGNOSIS — M109 Gout, unspecified: Secondary | ICD-10-CM | POA: Diagnosis not present

## 2019-08-20 DIAGNOSIS — E039 Hypothyroidism, unspecified: Secondary | ICD-10-CM | POA: Diagnosis not present

## 2019-08-20 DIAGNOSIS — K219 Gastro-esophageal reflux disease without esophagitis: Secondary | ICD-10-CM | POA: Diagnosis not present

## 2019-08-20 DIAGNOSIS — E785 Hyperlipidemia, unspecified: Secondary | ICD-10-CM | POA: Diagnosis not present

## 2019-08-20 LAB — COMPREHENSIVE METABOLIC PANEL
ALT: 16 U/L (ref 0–35)
AST: 19 U/L (ref 0–37)
Albumin: 4.2 g/dL (ref 3.5–5.2)
Alkaline Phosphatase: 64 U/L (ref 39–117)
BUN: 28 mg/dL — ABNORMAL HIGH (ref 6–23)
CO2: 25 mEq/L (ref 19–32)
Calcium: 9.4 mg/dL (ref 8.4–10.5)
Chloride: 104 mEq/L (ref 96–112)
Creatinine, Ser: 0.79 mg/dL (ref 0.40–1.20)
GFR: 71.34 mL/min (ref >60.00)
Glucose, Bld: 106 mg/dL — ABNORMAL HIGH (ref 70–99)
Potassium: 4.5 mEq/L (ref 3.5–5.1)
Sodium: 137 mEq/L (ref 135–145)
Total Bilirubin: 0.4 mg/dL (ref 0.2–1.2)
Total Protein: 6.7 g/dL (ref 6.0–8.3)

## 2019-08-20 LAB — LIPID PANEL
Cholesterol: 174 mg/dL (ref 0–200)
HDL: 52.4 mg/dL (ref >39.00)
NonHDL: 121.58
Total CHOL/HDL Ratio: 3
Triglycerides: 228 mg/dL — ABNORMAL HIGH (ref 0.0–149.0)
VLDL: 45.6 mg/dL — ABNORMAL HIGH (ref 0.0–40.0)

## 2019-08-20 LAB — CBC WITH DIFFERENTIAL/PLATELET
Basophils Absolute: 0.1 10*3/uL (ref 0.0–0.1)
Basophils Relative: 1.1 % (ref 0.0–3.0)
Eosinophils Absolute: 0.3 10*3/uL (ref 0.0–0.7)
Eosinophils Relative: 5.2 % — ABNORMAL HIGH (ref 0.0–5.0)
HCT: 38.2 % (ref 36.0–46.0)
Hemoglobin: 13 g/dL (ref 12.0–15.0)
Lymphocytes Relative: 24.4 % (ref 12.0–46.0)
Lymphs Abs: 1.5 10*3/uL (ref 0.7–4.0)
MCHC: 33.9 g/dL (ref 30.0–36.0)
MCV: 95.9 fl (ref 78.0–100.0)
Monocytes Absolute: 0.5 10*3/uL (ref 0.1–1.0)
Monocytes Relative: 8.3 % (ref 3.0–12.0)
Neutro Abs: 3.6 10*3/uL (ref 1.4–7.7)
Neutrophils Relative %: 61 % (ref 43.0–77.0)
Platelets: 170 10*3/uL (ref 150.0–400.0)
RBC: 3.98 Mil/uL (ref 3.87–5.11)
RDW: 13.3 % (ref 11.5–15.5)
WBC: 6 10*3/uL (ref 4.0–10.5)

## 2019-08-20 LAB — LDL CHOLESTEROL, DIRECT: Direct LDL: 77 mg/dL

## 2019-08-20 LAB — HEMOGLOBIN A1C: Hgb A1c MFr Bld: 6.1 % (ref 4.6–6.5)

## 2019-08-20 LAB — TSH: TSH: 2.84 u[IU]/mL (ref 0.35–4.50)

## 2019-08-20 NOTE — Assessment & Plan Note (Signed)
Chronic Check a1c Low sugar / carb diet Stressed regular exercise  

## 2019-08-20 NOTE — Assessment & Plan Note (Signed)
>>  ASSESSMENT AND PLAN FOR PULMONARY EMPHYSEMA (HCC) WRITTEN ON 08/20/2019 12:34 PM BY BURNS, STACY J, MD  Chronic SOB only with walking up an incline Not currently on treatment activity limited by chronic pain Following with pulmonary

## 2019-08-20 NOTE — Assessment & Plan Note (Signed)
Chronic SOB only with walking up an incline Not currently on treatment activity limited by chronic pain Following with pulmonary

## 2019-08-20 NOTE — Assessment & Plan Note (Signed)
Chronic  Clinically euthyroid Check tsh  Titrate med dose if needed  

## 2019-08-20 NOTE — Patient Instructions (Signed)
April Combs , Thank you for taking time to come for your Medicare Wellness Visit. I appreciate your ongoing commitment to your health goals. Please review the following plan we discussed and let me know if I can assist you in the future.   Screening recommendations/referrals: Colonoscopy: last done on 01/26/2012; due 2023 Mammogram: last done on 06/10/2017 Bone Density: last done 03/15/2012 Recommended yearly ophthalmology/optometry visit for glaucoma screening and checkup Recommended yearly dental visit for hygiene and checkup  Vaccinations: Influenza vaccine: 11/09/2018 Pneumococcal vaccine: completed Tdap vaccine: 02/29/2008; overdue Shingles vaccine: declined due to copay  Covid-19: completed  Advanced directives: Yes  Conditions/risks identified: Yes  Next appointment: Please schedule your next Medicare Wellness Visit in 1 year with your Health Coach.   Preventive Care 37 Years and Older, Female Preventive care refers to lifestyle choices and visits with your health care provider that can promote health and wellness. What does preventive care include?  A yearly physical exam. This is also called an annual well check.  Dental exams once or twice a year.  Routine eye exams. Ask your health care provider how often you should have your eyes checked.  Personal lifestyle choices, including:  Daily care of your teeth and gums.  Regular physical activity.  Eating a healthy diet.  Avoiding tobacco and drug use.  Limiting alcohol use.  Practicing safe sex.  Taking low-dose aspirin every day.  Taking vitamin and mineral supplements as recommended by your health care provider. What happens during an annual well check? The services and screenings done by your health care provider during your annual well check will depend on your age, overall health, lifestyle risk factors, and family history of disease. Counseling  Your health care provider may ask you questions about  your:  Alcohol use.  Tobacco use.  Drug use.  Emotional well-being.  Home and relationship well-being.  Sexual activity.  Eating habits.  History of falls.  Memory and ability to understand (cognition).  Work and work Statistician.  Reproductive health. Screening  You may have the following tests or measurements:  Height, weight, and BMI.  Blood pressure.  Lipid and cholesterol levels. These may be checked every 5 years, or more frequently if you are over 96 years old.  Skin check.  Lung cancer screening. You may have this screening every year starting at age 38 if you have a 30-pack-year history of smoking and currently smoke or have quit within the past 15 years.  Fecal occult blood test (FOBT) of the stool. You may have this test every year starting at age 61.  Flexible sigmoidoscopy or colonoscopy. You may have a sigmoidoscopy every 5 years or a colonoscopy every 10 years starting at age 75.  Hepatitis C blood test.  Hepatitis B blood test.  Sexually transmitted disease (STD) testing.  Diabetes screening. This is done by checking your blood sugar (glucose) after you have not eaten for a while (fasting). You may have this done every 1-3 years.  Bone density scan. This is done to screen for osteoporosis. You may have this done starting at age 73.  Mammogram. This may be done every 1-2 years. Talk to your health care provider about how often you should have regular mammograms. Talk with your health care provider about your test results, treatment options, and if necessary, the need for more tests. Vaccines  Your health care provider may recommend certain vaccines, such as:  Influenza vaccine. This is recommended every year.  Tetanus, diphtheria, and acellular pertussis (Tdap,  Td) vaccine. You may need a Td booster every 10 years.  Zoster vaccine. You may need this after age 110.  Pneumococcal 13-valent conjugate (PCV13) vaccine. One dose is recommended  after age 69.  Pneumococcal polysaccharide (PPSV23) vaccine. One dose is recommended after age 62. Talk to your health care provider about which screenings and vaccines you need and how often you need them. This information is not intended to replace advice given to you by your health care provider. Make sure you discuss any questions you have with your health care provider. Document Released: 03/28/2015 Document Revised: 11/19/2015 Document Reviewed: 12/31/2014 Elsevier Interactive Patient Education  2017 [place] Prevention in the Home Falls can cause injuries. They can happen to people of all ages. There are many things you can do to make your home safe and to help prevent falls. What can I do on the outside of my home?  Regularly fix the edges of walkways and driveways and fix any cracks.  Remove anything that might make you trip as you walk through a door, such as a raised step or threshold.  Trim any bushes or trees on the path to your home.  Use bright outdoor lighting.  Clear any walking paths of anything that might make someone trip, such as rocks or tools.  Regularly check to see if handrails are loose or broken. Make sure that both sides of any steps have handrails.  Any raised decks and porches should have guardrails on the edges.  Have any leaves, snow, or ice cleared regularly.  Use sand or salt on walking paths during winter.  Clean up any spills in your garage right away. This includes oil or grease spills. What can I do in the bathroom?  Use night lights.  Install grab bars by the toilet and in the tub and shower. Do not use towel bars as grab bars.  Use non-skid mats or decals in the tub or shower.  If you need to sit down in the shower, use a plastic, non-slip stool.  Keep the floor dry. Clean up any water that spills on the floor as soon as it happens.  Remove soap buildup in the tub or shower regularly.  Attach bath mats securely with  double-sided non-slip rug tape.  Do not have throw rugs and other things on the floor that can make you trip. What can I do in the bedroom?  Use night lights.  Make sure that you have a light by your bed that is easy to reach.  Do not use any sheets or blankets that are too big for your bed. They should not hang down onto the floor.  Have a firm chair that has side arms. You can use this for support while you get dressed.  Do not have throw rugs and other things on the floor that can make you trip. What can I do in the kitchen?  Clean up any spills right away.  Avoid walking on wet floors.  Keep items that you use a lot in easy-to-reach places.  If you need to reach something above you, use a strong step stool that has a grab bar.  Keep electrical cords out of the way.  Do not use floor polish or wax that makes floors slippery. If you must use wax, use non-skid floor wax.  Do not have throw rugs and other things on the floor that can make you trip. What can I do with my stairs?  Do not  leave any items on the stairs.  Make sure that there are handrails on both sides of the stairs and use them. Fix handrails that are broken or loose. Make sure that handrails are as long as the stairways.  Check any carpeting to make sure that it is firmly attached to the stairs. Fix any carpet that is loose or worn.  Avoid having throw rugs at the top or bottom of the stairs. If you do have throw rugs, attach them to the floor with carpet tape.  Make sure that you have a light switch at the top of the stairs and the bottom of the stairs. If you do not have them, ask someone to add them for you. What else can I do to help prevent falls?  Wear shoes that:  Do not have high heels.  Have rubber bottoms.  Are comfortable and fit you well.  Are closed at the toe. Do not wear sandals.  If you use a stepladder:  Make sure that it is fully opened. Do not climb a closed stepladder.  Make  sure that both sides of the stepladder are locked into place.  Ask someone to hold it for you, if possible.  Clearly mark and make sure that you can see:  Any grab bars or handrails.  First and last steps.  Where the edge of each step is.  Use tools that help you move around (mobility aids) if they are needed. These include:  Canes.  Walkers.  Scooters.  Crutches.  Turn on the lights when you go into a dark area. Replace any light bulbs as soon as they burn out.  Set up your furniture so you have a clear path. Avoid moving your furniture around.  If any of your floors are uneven, fix them.  If there are any pets around you, be aware of where they are.  Review your medicines with your doctor. Some medicines can make you feel dizzy. This can increase your chance of falling. Ask your doctor what other things that you can do to help prevent falls. This information is not intended to replace advice given to you by your health care provider. Make sure you discuss any questions you have with your health care provider. Document Released: 12/26/2008 Document Revised: 08/07/2015 Document Reviewed: 04/05/2014 Elsevier Interactive Patient Education  2017 [employer].

## 2019-08-20 NOTE — Progress Notes (Signed)
Subjective:   April Combs is a 74 y.o. female who presents for Medicare Annual (Subsequent) preventive examination.  Review of Systems:  No ROS. Medicare Wellness Visit Cardiac Risk Factors include: advanced age (>64mn, >>18women);dyslipidemia;family history of premature cardiovascular disease;hypertension;obesity (BMI >30kg/m2)     Objective:     Vitals: BP 122/60 (BP Location: Left Arm, Patient Position: Sitting, Cuff Size: Normal)   Pulse 61   Temp 98 F (36.7 C)   Resp 16   Ht 5' 6" (1.676 m)   Wt 249 lb 3.2 oz (113 kg)   SpO2 96%   BMI 40.22 kg/m   Body mass index is 40.22 kg/m.  Advanced Directives 08/20/2019 04/06/2017 04/05/2015 02/01/2014  Does Patient Have a Medical Advance Directive? Yes Yes Yes Yes  Type of Advance Directive - Living will - [place]  Does patient want to make changes to medical advance directive? No - Patient declined - - No - Patient declined    Tobacco Social History   Tobacco Use  Smoking Status Former Smoker  . Packs/day: 1.00  . Years: 30.00  . Pack years: 30.00  . Quit date: 01/18/2003  . Years since quitting: 16.5  Smokeless Tobacco Never Used     Counseling given: No   Clinical Intake:  Pre-visit preparation completed: Yes  Pain : 0-10 Pain Score: 5  Pain Type: Acute pain Pain Location: Back Pain Orientation: Lower Pain Radiating Towards: left leg Pain Descriptors / Indicators: Sharp, Throbbing, Constant Pain Onset: More than a month ago Pain Frequency: Constant Pain Relieving Factors: Hydrocodome and Gabapentin  Pain Relieving Factors: Hydrocodome and Gabapentin  BMI - recorded: 40.22 Nutritional Status: BMI > 30  Obese Nutritional Risks: None Diabetes: No  How often do you need to have someone help you when you read instructions, pamphlets, or other written materials from your doctor or pharmacy?: 1 - Never What is the last grade level you completed in school?: 12th  grade  Interpreter Needed?: No  Information entered by :: Luceil Herrin N. HLowell Guitar LPN  Past Medical History:  Diagnosis Date  . Anemia   . Anxiety   . Coronary artery disease (CAD) excluded 01/2014   Low Risk Myoview (false positive) with possible inferior-inferolateral ischemia, but with nonsustained VT --> CATH --> Angiographically Normal Coronary Arteries; coronary CT angiogram showed mild approximately disease but otherwise no obstructive disease.  Coronary calcium score 40.  . Depression   . GERD (gastroesophageal reflux disease)   . Hyperlipidemia   . Hyperplastic colon polyp   . Hypertension   . NICM (nonischemic cardiomyopathy) ([facility]    Essentially resolved. Echocardiogram July 2013 showed normal EF greater than 55%. Important septal motion. Normal LV filling pressures. Mild MR and mild anterior MVP  . Obesity (BMI 30-39.9)   . Osteoarthritis   . Positional vertigo    Past Surgical History:  Procedure Laterality Date  . ABDOMINAL HYSTERECTOMY    . APPENDECTOMY  as child  . BREAST BIOPSY Right    benign  . CHOLECYSTECTOMY    . COLONOSCOPY    . CORONARY CALCIUM SCORE/CT ANGIOGRAM  07/2017   Calcium score 40.  Mild LAD stenosis, but no other significant disease.  .Jodelle GrossECHOCARDIOGRAPHY  July 2013   09/20/11. normal Lv thickness and function with EF greater thatn 55%  . Exercise tolerance test - CPET-MET-TEST  July 2013   Submaximal effort. Only 80% of her, therefore peak VO2 of 75% is likely an underestimate. High  resting heart rate, achieving 86% of heart rate. --> Unable to interpret due to lack of effort.  [name] GASTRIC BYPASS  1980  . GASTRIC BYPASS    . KIDNEY STONE SURGERY    . LEFT HEART CATHETERIZATION WITH CORONARY ANGIOGRAM N/A 02/01/2014   Procedure: LEFT HEART CATHETERIZATION WITH CORONARY ANGIOGRAM;  Surgeon: Leonie Man, MD;  Location[facility] CATH LAB;  Service: Cardiovascular: Angiographically normal coronaries  . NM MYOVIEW LTD  November 2015   4:20 min, 4.6  METS --> DOE, but no chest discomfort; Significant + ST -T wave changes noted with NSVT & PVCs. Images suggest Inferior-inferolateral ischemia.  Low Risk. -- FALSE POSITIVE  . Pulmonary Function Tests  July 2013   Increased densities, decreased FVC - consistent with obesity hypoventilation; FEV1 is 58%, FVC 60% of predicted.  . TRANSTHORACIC ECHOCARDIOGRAM  04/2017   Mildly reduced EF of 45%.  No regional wall motion normality.  GR 1 DD  . UPPER GASTROINTESTINAL ENDOSCOPY     Family History  Problem Relation Age of Onset  . Kidney disease Daughter   . Multiple sclerosis Daughter   . Heart disease Father   . Heart disease Mother   . Thyroid disease Mother   . Heart disease Paternal Grandfather   . Colon cancer Other        early 38's.  Genetic testing from maternal side of family.  . Colon cancer Maternal Aunt   . Breast cancer Maternal Aunt   . Lung cancer Sister        smoker  . Diabetes Sister   . Colon cancer Maternal Uncle   . Diabetes Daughter        x3  . Heart disease Maternal Aunt   . Colon polyps Neg Hx   . Esophageal cancer Neg Hx   . Gallbladder disease Neg Hx   . Rectal cancer Neg Hx   . Stomach cancer Neg Hx    Social History   Socioeconomic History  . Marital status: Married    Spouse name: Not on file  . Number of children: 3  . Years of education: Not on file  . Highest education level: Not on file  Occupational History  . Occupation: Retired  Tobacco Use  . Smoking status: Former Smoker    Packs/day: 1.00    Years: 30.00    Pack years: 30.00    Quit date: 01/18/2003    Years since quitting: 16.5  . Smokeless tobacco: Never Used  Substance and Sexual Activity  . Alcohol use: Yes    Alcohol/week: 3.0 standard drinks    Types: 3 Cans of beer per week    Comment: drinks 2-3 beers a week  . Drug use: No  . Sexual activity: Not on file  Other Topics Concern  . Not on file  Social History Narrative   She is married to April Combs, also patient of  Dr. Ellyn Hack.   Mother of 3, grandmother 98.   Quit smoking in 2005. Social alcohol.   No routine exercise.   Social Determinants of Health   Financial Resource Strain:   . Difficulty of Paying Living Expenses:   Food Insecurity:   . Worried About Charity fundraiser in the Last Year:   . Arboriculturist in the Last Year:   Transportation Needs:   . Film/video editor (Medical):   [name] Lack of Transportation (Non-Medical):   Physical Activity:   . Days of Exercise per Week:   .  Minutes of Exercise per Session:   Stress:   . Feeling of Stress :   Social Connections:   . Frequency of Communication with Friends and Family:   . Frequency of Social Gatherings with Friends and Family:   . Attends Religious Services:   . Active Member of Clubs or Organizations:   . Attends Archivist Meetings:   [name] Marital Status:     Outpatient Encounter Medications as of 08/20/2019  Medication Sig  . allopurinol (ZYLOPRIM) 300 MG tablet Take 1 tablet (300 mg total) by mouth daily.  [name] atorvastatin (LIPITOR) 20 MG tablet Take 1 tablet (20 mg total) by mouth daily.  . carvedilol (COREG) 12.5 MG tablet Take 1.5 tablets (18.75 mg total) by mouth 2 (two) times daily with a meal.  . furosemide (LASIX) 20 MG tablet TAKE 1 TABLET BY MOUTH ONCE DAILY AS NEEDED  . gabapentin (NEURONTIN) 100 MG capsule Take 100 mg in morning and 200 mg at bedtime  . levothyroxine (SYNTHROID) 50 MCG tablet Take 1 tablet (50 mcg total) by mouth daily.  [name] lisinopril-hydrochlorothiazide (ZESTORETIC) 20-25 MG tablet Take 1 tablet by mouth once daily  . nitroGLYCERIN (NITROSTAT) 0.4 MG SL tablet DISSOLVE ONE TABLET UNDER THE TONGUE EVERY 5 MINUTES AS NEEDED FOR CHEST PAIN.  DO NOT EXCEED A TOTAL OF 3 DOSES IN 15 MINUTES  . omeprazole (PRILOSEC) 20 MG capsule Take 1 capsule (20 mg total) by mouth daily.  [name] HYDROcodone-acetaminophen (NORCO/VICODIN) 5-325 MG tablet Take 1-2 tablets by mouth every 4 (four) hours as needed for  severe pain. (Patient not taking: Reported on 08/20/2019)  . meloxicam (MOBIC) 7.5 MG tablet Take 1 tablet (7.5 mg total) by mouth daily. Take with food (Patient not taking: Reported on 08/20/2019)   No facility-administered encounter medications on file as of 08/20/2019.    Activities of Daily Living In your present state of health, do you have any difficulty performing the following activities: 08/20/2019  Hearing? N  Vision? N  Difficulty concentrating or making decisions? N  Walking or climbing stairs? N  Dressing or bathing? N  Doing errands, shopping? N  Preparing Food and eating ? N  Using the Toilet? N  In the past six months, have you accidently leaked urine? N  Do you have problems with loss of bowel control? N  Managing your Medications? N  Managing your Finances? N  Housekeeping or managing your Housekeeping? N  Some recent data might be hidden    Patient Care Team: Binnie Rail, MD as PCP - General (Internal Medicine) Leonie Man, MD as PCP - Cardiology (Cardiology) Leonie Man, MD as Consulting Physician (Cardiology) Lafayette Dragon, MD (Inactive) as Consulting Physician (Gastroenterology) Brand Males, MD as Consulting Physician (Pulmonary Disease)    Assessment:   This is a routine wellness examination for Belia.  Exercise Activities and Dietary recommendations Current Exercise Habits: The patient does not participate in regular exercise at present, Exercise limited by: orthopedic condition(s);neurologic condition(s);respiratory conditions(s)  Goals   None     Fall Risk Fall Risk  08/20/2019 02/19/2019 08/18/2018 01/27/2018 02/17/2017  Falls in the past year? 1 1 0 0 No  Comment - - - Emmi Telephone Survey: data to providers prior to load Emmi Telephone Survey: data to providers prior to load  Number falls in past yr: 1 1 0 - -  Injury with Fall? 0 0 - - -  Risk for fall due to : Orthopedic patient;Impaired balance/gait - - - -  Follow up Falls  evaluation completed;Education provided;Falls prevention discussed - - - -   Is the patient's home free of loose throw rugs in walkways, pet beds, electrical cords, etc?   yes      Grab bars in the bathroom? no      Handrails on the stairs?   yes      Adequate lighting?   yes  Timed Get Up and Go performed: not indicated  Depression Screen PHQ 2/9 Scores 08/20/2019 08/18/2018 12/13/2016 09/08/2016  PHQ - 2 Score 0 0 0 0  PHQ- 9 Score - - - 0     Cognitive Function: not indicated        Immunization History  Administered Date(s) Administered  . Fluad Quad(high Dose 65+) 11/09/2018  . Influenza, High Dose Seasonal PF 01/28/2015, 12/24/2015, 12/13/2016, 11/16/2017  . Influenza,inj,Quad PF,6+ Mos 12/24/2015  . Janssen (J&J) SARS-COV-2 Vaccination 06/01/2019  . Pneumococcal Conjugate-13 09/19/2013  . Pneumococcal Polysaccharide-23 09/25/2014  . Tdap 02/29/2008    Qualifies for Shingles Vaccine? Yes, patient declined due to cost  Screening Tests Health Maintenance  Topic Date Due  . TETANUS/TDAP  02/28/2018  . INFLUENZA VACCINE  10/14/2019  . COLONOSCOPY  01/25/2022  . DEXA SCAN  Completed  . COVID-19 Vaccine  Completed  . Hepatitis C Screening  Completed  . PNA vac Low Risk Adult  Completed    Cancer Screenings: Lung: Low Dose CT Chest recommended if Age 57-80 years, 30 pack-year currently smoking OR have quit w/in 15years. Patient does qualify. Breast:  Up to date on Mammogram? Yes   Up to date of Bone Density/Dexa? Yes Colorectal: Due 01/24/2022  Additional Screenings: Hepatitis C Screening: completed     Plan:     Reviewed health maintenance screenings with patient today and relevant education, vaccines, and/or referrals were provided.    Continue doing brain stimulating activities (puzzles, reading, adult coloring books, staying active) to keep memory sharp.    Continue to eat heart healthy diet (full of fruits, vegetables, whole grains, lean protein,  water--limit salt, fat, and sugar intake) and increase physical activity as tolerated.  I have personally reviewed and noted the following in the patient's chart:   . Medical and social history . Use of alcohol, tobacco or illicit drugs  . Current medications and supplements . Functional ability and status . Nutritional status . Physical activity . Advanced directives . List of other physicians . Hospitalizations, surgeries, and ER visits in previous 12 months . Vitals . Screenings to include cognitive, depression, and falls . Referrals and appointments  In addition, I have reviewed and discussed with patient certain preventive protocols, quality metrics, and best practice recommendations. A written personalized care plan for preventive services as well as general preventive health recommendations were provided to patient.     Sheral Flow, LPN  08/17/6978  Nurse Health Advisor

## 2019-08-20 NOTE — Assessment & Plan Note (Signed)
Chronic GERD controlled Continue daily medication  

## 2019-08-20 NOTE — Assessment & Plan Note (Signed)
Chronic No gout flares since she was here last Continue allopurinol at current dose

## 2019-08-20 NOTE — Assessment & Plan Note (Signed)
Chronic BP well controlled Current regimen effective and well tolerated Continue current medications at current doses cmp  

## 2019-08-20 NOTE — Assessment & Plan Note (Signed)
Chronic Check lipid panel  Continue daily statin Regular exercise and healthy diet encouraged  

## 2019-08-21 ENCOUNTER — Encounter: Payer: Self-pay | Admitting: Internal Medicine

## 2019-08-30 ENCOUNTER — Other Ambulatory Visit: Payer: Self-pay

## 2019-08-30 MED ORDER — ATORVASTATIN CALCIUM 20 MG PO TABS: 20.0000 mg | ORAL_TABLET | Freq: Every day | ORAL | 1 refills | Status: DC

## 2019-08-30 MED ORDER — LEVOTHYROXINE SODIUM 50 MCG PO TABS: 50.0000 ug | ORAL_TABLET | Freq: Every day | ORAL | 3 refills | Status: DC

## 2019-09-03 ENCOUNTER — Other Ambulatory Visit: Payer: Self-pay | Admitting: Internal Medicine

## 2019-09-10 ENCOUNTER — Encounter: Payer: Self-pay | Admitting: Cardiology

## 2019-09-10 ENCOUNTER — Ambulatory Visit (INDEPENDENT_AMBULATORY_CARE_PROVIDER_SITE_OTHER): Payer: Medicare Other | Admitting: Cardiology

## 2019-09-10 ENCOUNTER — Other Ambulatory Visit: Payer: Self-pay

## 2019-09-10 VITALS — BP 120/76 | HR 77 | Temp 97.8°F | Ht 65.0 in | Wt 247.0 lb

## 2019-09-10 DIAGNOSIS — I10 Essential (primary) hypertension: Secondary | ICD-10-CM

## 2019-09-10 DIAGNOSIS — I5032 Chronic diastolic (congestive) heart failure: Secondary | ICD-10-CM | POA: Diagnosis not present

## 2019-09-10 DIAGNOSIS — E785 Hyperlipidemia, unspecified: Secondary | ICD-10-CM

## 2019-09-10 DIAGNOSIS — I499 Cardiac arrhythmia, unspecified: Secondary | ICD-10-CM

## 2019-09-10 DIAGNOSIS — I110 Hypertensive heart disease with heart failure: Secondary | ICD-10-CM | POA: Diagnosis not present

## 2019-09-10 NOTE — Progress Notes (Signed)
Primary Care Provider: Binnie Rail, MD Cardiologist: Glenetta Hew, MD Electrophysiologist: None  Clinic Note: Chief Complaint  Patient presents with  . Follow-up    Overall doing better  . Hypertension  . Congestive Heart Failure    HFpEF    HPI:    April Combs is a 74 y.o. female with a PMH notable for hypertensive heart disease/chronic HFpEF and obesity with chronic lung disease who presents today for 55-monthfollow-up.  [name] the widow of one of my long-term patients ([name]. She has a history of non-obstructive CAD by cath back in a 2015-  Coronary Calcium Score/CT Angiogram 07/13/2017:Calcium score 40. Mild LAD stenosis, but no other significant disease  She was given a diagnosis of emphysema on chest x-ray and has had profound exertional dyspnea.  BTeighlor Korsonwas last seen on March 02, 2019 via telemedicine.  She had no real major complaints.  She had done really well on Trelegy, but was not able to afford it.  Since stopping her really gotten worse.  Noted occasional skipped beats and forceful beats but no prolonged episodes.  No dizziness or lightheadedness.  Headache with hypertension.  No chest pain or pressure. -->  No medication changes  Recent Hospitalizations: None  Reviewed  CV studies:    The following studies were reviewed today: (if available, images/films reviewed: From Epic Chart or Care Everywhere) . No new studies:   Interval History:   BTessy Pawelskireturns here today just after the year anniversary from her husband FBaxter Flatterypassing.  She comments that since his death, she has had to learn a lot including how to use a credit card and a debit card.  She is having to work on getting home repairs done including redoing her shower because of a leak.  She says that her breathing is actually better because she has been on to get her Trelegy.  Basically as long as she takes the Trelegy even intermittently, her breathing is much  better.  Overall she says she is doing well, does not really have any active CHF symptoms or angina..  She says it every now and then her heart rate will go up fast off and on.  It is usually when she is doing something and gets frustrated.  She does get exertional dyspnea but is actually better than it has been.  No PND or orthopnea despite having some occasional swelling for which she takes her Lasix.  As long as she takes it every so often she controls her swelling.  Maybe once or twice a month she will have to take it for about 3 to 4 days every other day.  Usually when she goes down to the beach she has to take it more frequently because she swells up more at the beach.  She does have some issues with balance and has had several falls of late.  Just getting down off the examination table, she had a wobble and went up against the wall.  The most notable symptom she has other than balance issues is chronic back and abdominal pain.  CV Review of Symptoms (Summary) Cardiovascular ROS: positive for - dyspnea on exertion, edema, rapid heart rate and As described negative for - chest pain, irregular heartbeat, orthopnea, palpitations, paroxysmal nocturnal dyspnea or Syncope/near syncope, TIA/amaurosis fugax, claudication  The patient does not have symptoms concerning for COVID-19 infection (fever, chills, cough, or new shortness of breath).  The patient is practicing social distancing & Masking.  She has had her Covid vaccine  REVIEWED OF SYSTEMS   Review of Systems  Constitutional: Negative for malaise/fatigue and weight loss.  HENT: Negative for congestion and nosebleeds.   Respiratory: Positive for cough (Occasional dry cough), shortness of breath (Better than baseline) and wheezing (Better when able to take her Trelegy).   Cardiovascular: Positive for leg swelling (Per HPI).  Musculoskeletal: Positive for back pain.  Neurological: Negative for dizziness.       Poor balance, unsteady gait    Psychiatric/Behavioral: Negative for depression and memory loss. The patient is not nervous/anxious and does not have insomnia.     Dry cough & , shortness of breath - more than used to b/c off Trelegy, off-and-on swelling.    Heartburn.  Gout pain.  Dizzy with standing up - fell into her dresser once last month -- NO LOC.  Now she takes her time standing up.  I have reviewed and (if needed) personally updated the patient's problem list, medications, allergies, past medical and surgical history, social and family history.   PAST MEDICAL HISTORY   Past Medical History:  Diagnosis Date  . Anemia   . Anxiety   . BRBPR (bright red blood per rectum) 02/05/2015  . Coronary artery disease (CAD) excluded 01/2014   Low Risk Myoview (false positive) with possible inferior-inferolateral ischemia, but with nonsustained VT --> CATH --> Angiographically Normal Coronary Arteries; coronary CT angiogram showed mild approximately disease but otherwise no obstructive disease.  Coronary calcium score 40.  . Depression   . FALSE POSITIVE NUCLEAR STRESS CHEST 01/28/2014  . Fungal dermatitis 09/29/2011  . GERD (gastroesophageal reflux disease)   . Gout 04/10/2017   First episode - Left foot 03/2017  . Hyperlipidemia   . Hyperlipidemia with target LDL less than 100 09/29/2011  . Hyperplastic colon polyp   . Hypertension   . Hypertensive 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 ([place]) 04/26/2017  . HYPERTRIGLYCERIDEMIA 07/12/2008   Qualifier: Diagnosis of  By: Birdie Riddle MD, Belenda Cruise    . Hypothyroidism 02/09/2017  . Irregular heart beat 09/29/2011  . Lumbar radiculopathy 12/24/2014  . Morbid obesity ([place]) 10/15/2015  . NICM (nonischemic cardiomyopathy) ([place])    Essentially resolved. Echocardiogram July 2013 showed normal EF greater than 55%. Important septal motion. Normal LV filling pressures. Mild MR and mild anterior MVP  . Obesity (BMI 30-39.9)   . Osteoarthritis   . Positional vertigo    . Prediabetes 08/10/2017  . Pulmonary emphysema ([place]) 04/29/2015   PFTs 2017 - moderate-severe obstructive disease, some restrictive disease  . SOB (shortness of breath) 08/23/2011    PAST SURGICAL HISTORY   Past Surgical History:  Procedure Laterality Date  . ABDOMINAL HYSTERECTOMY    . APPENDECTOMY  as child  . BREAST BIOPSY Right    benign  . CHOLECYSTECTOMY    . COLONOSCOPY    . CORONARY CALCIUM SCORE/CT ANGIOGRAM  07/2017   Calcium score 40.  Mild LAD stenosis, but no other significant disease.  Jodelle Gross ECHOCARDIOGRAPHY  July 2013   09/20/11. normal Lv thickness and function with EF greater thatn 55%  . Exercise tolerance test - CPET-MET-TEST  July 2013   Submaximal effort. Only 80% of her, therefore peak VO2 of 75% is likely an underestimate. High resting heart rate, achieving 86% of heart rate. --> Unable to interpret due to lack of effort.  [name] GASTRIC BYPASS  1980  . GASTRIC BYPASS    . KIDNEY STONE SURGERY    . LEFT HEART  CATHETERIZATION WITH CORONARY ANGIOGRAM N/A 02/01/2014   Procedure: LEFT HEART CATHETERIZATION WITH CORONARY ANGIOGRAM;  Surgeon: Leonie Man, MD;  Location[facility] CATH LAB;  Service: Cardiovascular: Angiographically normal coronaries  . NM MYOVIEW LTD  November 2015   4:20 min, 4.6 METS --> DOE, but no chest discomfort; Significant + ST -T wave changes noted with NSVT & PVCs. Images suggest Inferior-inferolateral ischemia.  Low Risk. -- FALSE POSITIVE  . Pulmonary Function Tests  July 2013   Increased densities, decreased FVC - consistent with obesity hypoventilation; FEV1 is 58%, FVC 60% of predicted.  . TRANSTHORACIC ECHOCARDIOGRAM  04/2017   Mildly reduced EF of 45%.  No regional wall motion normality.  GR 1 DD  . UPPER GASTROINTESTINAL ENDOSCOPY      MEDICATIONS/ALLERGIES   Current Meds  Medication Sig  . allopurinol (ZYLOPRIM) 300 MG tablet Take 1 tablet (300 mg total) by mouth daily.  [name] atorvastatin (LIPITOR) 20 MG tablet Take 1 tablet (20 mg  total) by mouth daily.  . carvedilol (COREG) 12.5 MG tablet Take 1.5 tablets (18.75 mg total) by mouth 2 (two) times daily with a meal.  . Fluticasone-Umeclidin-Vilant (TRELEGY ELLIPTA) 100-62.5-25 MCG/INH AEPB Inhale into the lungs.  . furosemide (LASIX) 20 MG tablet TAKE 1 TABLET BY MOUTH ONCE DAILY AS NEEDED  . gabapentin (NEURONTIN) 100 MG capsule Take 100 mg in morning and 200 mg at bedtime  . levothyroxine (SYNTHROID) 50 MCG tablet Take 1 tablet (50 mcg total) by mouth daily.  [name] lisinopril-hydrochlorothiazide (ZESTORETIC) 20-25 MG tablet Take 1 tablet by mouth once daily  . nitroGLYCERIN (NITROSTAT) 0.4 MG SL tablet DISSOLVE ONE TABLET UNDER THE TONGUE EVERY 5 MINUTES AS NEEDED FOR CHEST PAIN.  DO NOT EXCEED A TOTAL OF 3 DOSES IN 15 MINUTES  . omeprazole (PRILOSEC) 20 MG capsule Take 1 capsule (20 mg total) by mouth daily.    No Known Allergies  SOCIAL HISTORY/FAMILY HISTORY   Reviewed in Epic:  Pertinent findings: No new changes  OBJCTIVE -PE, EKG, labs   Wt Readings from Last 3 Encounters:  09/10/19 247 lb (112 kg)  08/20/19 249 lb (112.9 kg)  08/20/19 249 lb 3.2 oz (113 kg)    Physical Exam: BP 120/76   Pulse 77   Temp 97.8 April (36.6 C)   Ht 5' 5"  (1.651 m)   Wt 247 lb (112 kg)   SpO2 96%   BMI 41.10 kg/m  Physical Exam Constitutional:      General: She is not in acute distress.    Appearance: Normal appearance. She is obese. She is not ill-appearing.     Comments: Morbidly obese.  Well-groomed.  HENT:     Head: Normocephalic and atraumatic.  Eyes:     Extraocular Movements: Extraocular movements intact.  Neck:     Vascular: No carotid bruit.  Cardiovascular:     Rate and Rhythm: Normal rate and regular rhythm. Occasional extrasystoles are present.    Chest Wall: PMI is not displaced.     Pulses: Normal pulses.     Heart sounds: S1 normal and S2 normal. Heart sounds are distant. Murmur heard.  Medium-pitched harsh crescendo-decrescendo early systolic murmur  is present with a grade of 1/6 at the upper right sternal border.  No friction rub. No gallop.   Pulmonary:     Comments: Distant breath sounds with faint expiratory wheeze with forced expiration Musculoskeletal:        General: No swelling. Normal range of motion.     Cervical  back: Normal range of motion and neck supple. No tenderness.  Skin:    General: Skin is warm and dry.  Neurological:     General: No focal deficit present.     Mental Status: She is alert and oriented to person, place, and time.     Comments: Unsteady gait, poor balance  Psychiatric:        Mood and Affect: Mood normal.        Behavior: Behavior normal.        Thought Content: Thought content normal.        Judgment: Judgment normal.     Adult ECG Report  Rate: 77 ;  Rhythm: normal sinus rhythm, premature ventricular contractions (PVC) and Left atrial abnormality, normal axis, intervals and durations.;   Narrative Interpretation: Stable EKG  Recent Labs:    Lab Results  Component Value Date   CHOL 174 08/20/2019   HDL 52.40 08/20/2019   LDLCALC 56 02/18/2016   LDLDIRECT 77.0 08/20/2019   TRIG 228.0 (H) 08/20/2019   CHOLHDL 3 08/20/2019   Lab Results  Component Value Date   CREATININE 0.79 08/20/2019   BUN 28 (H) 08/20/2019   NA 137 08/20/2019   K 4.5 08/20/2019   CL 104 08/20/2019   CO2 25 08/20/2019   Lab Results  Component Value Date   TSH 2.84 08/20/2019    ASSESSMENT/PLAN    Problem List Items Addressed This Visit    Morbid obesity ([place]) - Primary (Chronic)    Balance issues and falls with walking it become an issue as far as exercise goes.  She does acknowledges that she has not probably been eating as well as she should.  The patient understands the need to lose weight with diet and exercise. We have discussed specific strategies for this.      Essential hypertension (Chronic)   Irregular heart beat (Chronic)    Very well controlled on current dose of carvedilol.  At this  point, I am reluctant to push the carvedilol any further.  She is now taking the carvedilol 1-1/2 tablets twice daily which seems to have palpitations under control.      Relevant Orders   EKG 12-Lead   Hyperlipidemia with target LDL less than 100 (Chronic)    Lipids were just checked, LDL 77 which with no obvious significant CAD is stable on current dose of atorvastatin.      Hypertensive 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 (HCC) (Chronic)    Blood pressure looks great today.  She is on a good dose of carvedilol as well as lisinopril HCTZ.  She is only requiring intermittent doses of Lasix.  She does still have some dyspnea which is probably more pulmonary in nature.  I think she does have a little bit of orthopnea on occasion which is probably also more related to body habitus than diastolic dysfunction.      Relevant Orders   EKG 12-Lead      COVID-19 Education: The signs and symptoms of COVID-19 were discussed with the patient and how to seek care for testing (follow up with PCP or arrange E-visit).   The importance of social distancing and COVID-19 vaccination was discussed today.  I spent a total of 37mnutes with the patient. >  50% of the time was spent in direct patient consultation.  Additional time spent with chart review  / charting (studies, outside notes, etc): 6 Total Time: 27 min   Current medicines are reviewed at length  with the patient today.  (+/- concerns) n/a  Notice: This dictation was prepared with Dragon dictation along with smaller phrase technology. Any transcriptional errors that result from this process are unintentional and may not be corrected upon review.  Patient Instructions / Medication Changes & Studies & Tests Ordered   Patient Instructions  Medication Instructions:  No changes *If you need a refill on your cardiac medications before your next appointment, please call your pharmacy*   Lab Work: Not  needed    Testing/Procedures: Not needed   Follow-Up: At [facility], you and your health needs are our priority.  As part of our continuing mission to provide you with exceptional heart care, we have created designated Provider Care Teams.  These Care Teams include your primary Cardiologist (physician) and Advanced Practice Providers (APPs -  Physician Assistants and Nurse Practitioners) who all work together to provide you with the care you need, when you need it.   Your next appointment:   12 month(s)  The format for your next appointment:   In Person  Provider:   Glenetta Hew, MD     Studies Ordered:   Orders Placed This Encounter  Procedures  . EKG 12-Lead     Glenetta Hew, M.D., M.S. Interventional Cardiologist   Pager # [phone] Phone # [phone] [address] [place], [place] [address]   Thank you for choosing Heartcare at [facility]!!

## 2019-09-10 NOTE — Assessment & Plan Note (Signed)
Lipids were just checked, LDL 77 which with no obvious significant CAD is stable on current dose of atorvastatin.

## 2019-09-10 NOTE — Assessment & Plan Note (Signed)
Very well controlled on current dose of carvedilol.  At this point, I am reluctant to push the carvedilol any further.  She is now taking the carvedilol 1-1/2 tablets twice daily which seems to have palpitations under control.

## 2019-09-10 NOTE — Assessment & Plan Note (Signed)
Blood pressure looks great today.  She is on a good dose of carvedilol as well as lisinopril HCTZ.  She is only requiring intermittent doses of Lasix.  She does still have some dyspnea which is probably more pulmonary in nature.  I think she does have a little bit of orthopnea on occasion which is probably also more related to body habitus than diastolic dysfunction.

## 2019-09-10 NOTE — Patient Instructions (Signed)

## 2019-09-10 NOTE — Assessment & Plan Note (Signed)
Balance issues and falls with walking it become an issue as far as exercise goes.  She does acknowledges that she has not probably been eating as well as she should.  The patient understands the need to lose weight with diet and exercise. We have discussed specific strategies for this.

## 2019-09-24 ENCOUNTER — Other Ambulatory Visit: Payer: Self-pay | Admitting: Internal Medicine

## 2019-10-03 ENCOUNTER — Other Ambulatory Visit: Payer: Self-pay

## 2019-10-03 DIAGNOSIS — I10 Essential (primary) hypertension: Secondary | ICD-10-CM

## 2019-10-03 MED ORDER — LISINOPRIL-HYDROCHLOROTHIAZIDE 20-25 MG PO TABS: 1.0000 | ORAL_TABLET | Freq: Every day | ORAL | 3 refills | Status: DC

## 2019-10-03 MED ORDER — CARVEDILOL 12.5 MG PO TABS: 18.7500 mg | ORAL_TABLET | Freq: Two times a day (BID) | ORAL | 3 refills | Status: DC

## 2019-10-03 MED ORDER — FUROSEMIDE 20 MG PO TABS: 20.0000 mg | ORAL_TABLET | Freq: Every day | ORAL | 3 refills | Status: DC | PRN

## 2019-10-03 NOTE — Addendum Note (Signed)
Addended by: Crissie Reese on: 10/03/2019 01:51 PM   Modules accepted: Orders

## 2019-10-04 ENCOUNTER — Other Ambulatory Visit: Payer: Self-pay

## 2019-10-18 ENCOUNTER — Encounter: Payer: Self-pay | Admitting: Emergency Medicine

## 2019-10-18 ENCOUNTER — Ambulatory Visit
Admission: EM | Admit: 2019-10-18 | Discharge: 2019-10-18 | Disposition: A | Discharge status: Home / Self Care | Payer: Medicare Other | Attending: Emergency Medicine | Admitting: Emergency Medicine

## 2019-10-18 ENCOUNTER — Other Ambulatory Visit: Payer: Self-pay

## 2019-10-18 DIAGNOSIS — S51812A Laceration without foreign body of left forearm, initial encounter: Secondary | ICD-10-CM

## 2019-10-18 NOTE — Discharge Instructions (Addendum)
Keep skin clean and dry. May change dressing once daily if needed. Steri-Strips may come off after 1 week: We will likely just peel off. Return if you develop worsening pain, redness, discharge, fever.

## 2019-10-18 NOTE — ED Triage Notes (Addendum)
Pt presents to [facility] for assessment of left skin flap and pain after a trip and fall this morning, hit the arm on the corner of her porch.  Bleeding is controlled.  Pt with a hx of multiple falls.  Impaired gait.  Patient states pain so bad at time of occurrence she threw up.  Continued nausea now.

## 2019-10-18 NOTE — ED Provider Notes (Signed)
[facility]    CSN: [id number] Arrival date & time: 10/18/19  1235      History   Chief Complaint Chief Complaint  Patient presents with  . Fall  . Arm Pain    HPI April Combs is a 74 y.o. female presenting for left forearm skin tear, dorsal aspect.  Denies anticoagulant use.  Was able to irrigate with water prior to arrival.  States that she tripped and fell this morning and hit her arm on the corner of her porch.  No head trauma, LOC.  States pain caused her to vomit earlier: No chest pain, palpitations, difficulty breathing, nausea at bedside.    Past Medical History:  Diagnosis Date  . Anemia   . Anxiety   . BRBPR (bright red blood per rectum) 02/05/2015  . Coronary artery disease (CAD) excluded 01/2014   Low Risk Myoview (false positive) with possible inferior-inferolateral ischemia, but with nonsustained VT --> CATH --> Angiographically Normal Coronary Arteries; coronary CT angiogram showed mild approximately disease but otherwise no obstructive disease.  Coronary calcium score 40.  . Depression   . FALSE POSITIVE NUCLEAR STRESS CHEST 01/28/2014  . Fungal dermatitis 09/29/2011  . GERD (gastroesophageal reflux disease)   . Gout 04/10/2017   First episode - Left foot 03/2017  . Hyperlipidemia   . Hyperlipidemia with target LDL less than 100 09/29/2011  . Hyperplastic colon polyp   . Hypertension   . Hypertensive 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 ([place]) 04/26/2017  . HYPERTRIGLYCERIDEMIA 07/12/2008   Qualifier: Diagnosis of  By: Birdie Riddle MD, Belenda Cruise    . Hypothyroidism 02/09/2017  . Irregular heart beat 09/29/2011  . Lumbar radiculopathy 12/24/2014  . Morbid obesity ([place]) 10/15/2015  . NICM (nonischemic cardiomyopathy) ([place])    Essentially resolved. Echocardiogram July 2013 showed normal EF greater than 55%. Important septal motion. Normal LV filling pressures. Mild MR and mild anterior MVP  . Obesity (BMI 30-39.9)   .  Osteoarthritis   . Positional vertigo   . Prediabetes 08/10/2017  . Pulmonary emphysema ([place]) 04/29/2015   PFTs 2017 - moderate-severe obstructive disease, some restrictive disease  . SOB (shortness of breath) 08/23/2011    Patient Active Problem List   Diagnosis Date Noted  . Physical deconditioning 08/18/2018  . Bilateral leg weakness 02/15/2018  . Prediabetes 08/10/2017  . Hypertensive 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 ([place]) 04/26/2017  . Gout 04/10/2017  . Hypothyroidism 02/09/2017  . Morbid obesity ([place]) 10/15/2015  . Pulmonary emphysema ([place]) 04/29/2015  . BRBPR (bright red blood per rectum) 02/05/2015  . Lumbar radiculopathy 12/24/2014  . Hx of tobacco use, presenting hazards to health 09/29/2014  . Nail fungus 03/11/2014  . FALSE POSITIVE NUCLEAR STRESS CHEST 01/28/2014  . Chest pain on exertion 01/14/2014  . GERD (gastroesophageal reflux disease) 05/15/2012  . Positional vertigo 12/22/2011  . Irregular heart beat 09/29/2011  . Fungal dermatitis 09/29/2011  . Hyperlipidemia with target LDL less than 100 09/29/2011  . SOB (shortness of breath) 08/23/2011  . HYPERTRIGLYCERIDEMIA 07/12/2008  . Essential hypertension 05/28/2008  . OSTEOARTHRITIS 05/28/2008    Past Surgical History:  Procedure Laterality Date  . ABDOMINAL HYSTERECTOMY    . APPENDECTOMY  as child  . BREAST BIOPSY Right    benign  . CHOLECYSTECTOMY    . COLONOSCOPY    . CORONARY CALCIUM SCORE/CT ANGIOGRAM  07/2017   Calcium score 40.  Mild LAD stenosis, but no other significant disease.  Jodelle Gross ECHOCARDIOGRAPHY  July  2013   09/20/11. normal Lv thickness and function with EF greater thatn 55%  . Exercise tolerance test - CPET-MET-TEST  July 2013   Submaximal effort. Only 80% of her, therefore peak VO2 of 75% is likely an underestimate. High resting heart rate, achieving 86% of heart rate. --> Unable to interpret due to lack of effort.  [name] GASTRIC BYPASS  1980  . GASTRIC  BYPASS    . KIDNEY STONE SURGERY    . LEFT HEART CATHETERIZATION WITH CORONARY ANGIOGRAM N/A 02/01/2014   Procedure: LEFT HEART CATHETERIZATION WITH CORONARY ANGIOGRAM;  Surgeon: Leonie Man, MD;  Location: [facility] CATH LAB;  Service: Cardiovascular: Angiographically normal coronaries  . NM MYOVIEW LTD  November 2015   4:20 min, 4.6 METS --> DOE, but no chest discomfort; Significant + ST -T wave changes noted with NSVT & PVCs. Images suggest Inferior-inferolateral ischemia.  Low Risk. -- FALSE POSITIVE  . Pulmonary Function Tests  July 2013   Increased densities, decreased FVC - consistent with obesity hypoventilation; FEV1 is 58%, FVC 60% of predicted.  . TRANSTHORACIC ECHOCARDIOGRAM  04/2017   Mildly reduced EF of 45%.  No regional wall motion normality.  GR 1 DD  . UPPER GASTROINTESTINAL ENDOSCOPY      OB History   No obstetric history on file.      Home Medications    Prior to Admission medications   Medication Sig Start Date End Date Taking? Authorizing Provider  allopurinol (ZYLOPRIM) 300 MG tablet Take 1 tablet by mouth once daily 09/24/19   Binnie Rail, MD  atorvastatin (LIPITOR) 20 MG tablet Take 1 tablet (20 mg total) by mouth daily. 08/30/19   Binnie Rail, MD  carvedilol (COREG) 12.5 MG tablet Take 1.5 tablets (18.75 mg total) by mouth 2 (two) times daily with a meal. 10/03/19   Leonie Man, MD  Fluticasone-Umeclidin-Vilant (TRELEGY ELLIPTA) 100-62.5-25 MCG/INH AEPB Inhale into the lungs. 05/24/19 05/23/20  [provider]  furosemide (LASIX) 20 MG tablet Take 1 tablet (20 mg total) by mouth daily as needed. 10/03/19   Leonie Man, MD  gabapentin (NEURONTIN) 100 MG capsule Take 100 mg in morning and 200 mg at bedtime 07/04/19   Binnie Rail, MD  levothyroxine (SYNTHROID) 50 MCG tablet Take 1 tablet (50 mcg total) by mouth daily. 08/30/19   Binnie Rail, MD  lisinopril-hydrochlorothiazide (ZESTORETIC) 20-25 MG tablet Take 1 tablet by mouth daily. 10/03/19    Leonie Man, MD  nitroGLYCERIN (NITROSTAT) 0.4 MG SL tablet DISSOLVE ONE TABLET UNDER THE TONGUE EVERY 5 MINUTES AS NEEDED FOR CHEST PAIN.  DO NOT EXCEED A TOTAL OF 3 DOSES IN 15 MINUTES 08/08/18   Leonie Man, MD  omeprazole (PRILOSEC) 20 MG capsule Take 1 capsule (20 mg total) by mouth daily. 02/19/19   Binnie Rail, MD    Family History Family History  Problem Relation Age of Onset  . Kidney disease Daughter   . Multiple sclerosis Daughter   . Heart disease Father   . Heart disease Mother   . Thyroid disease Mother   . Heart disease Paternal Grandfather   . Colon cancer Other        early 91's.  Genetic testing from maternal side of family.  . Colon cancer Maternal Aunt   . Breast cancer Maternal Aunt   . Lung cancer Sister        smoker  . Diabetes Sister   . Colon cancer Maternal Uncle   .  Diabetes Daughter        x3  . Heart disease Maternal Aunt   . Colon polyps Neg Hx   . Esophageal cancer Neg Hx   . Gallbladder disease Neg Hx   . Rectal cancer Neg Hx   . Stomach cancer Neg Hx     Social History Social History   Tobacco Use  . Smoking status: Former Smoker    Packs/day: 1.00    Years: 30.00    Pack years: 30.00    Quit date: 01/18/2003    Years since quitting: 16.7  . Smokeless tobacco: Never Used  Substance Use Topics  . Alcohol use: Yes    Alcohol/week: 3.0 standard drinks    Types: 3 Cans of beer per week    Comment: drinks 2-3 beers a week  . Drug use: No     Allergies   Patient has no known allergies.   Review of Systems As per HPI  Physical Exam Triage Vital Signs ED Triage Vitals  Enc Vitals Group     BP      Pulse      Resp      Temp      Temp src      SpO2      Weight      Height      Head Circumference      Peak Flow      Pain Score      Pain Loc      Pain Edu?      Excl. in [place]?    No data found.  Updated Vital Signs BP (!) 152/83 (BP Location: Right Arm)   Pulse 90   Temp 98 F (36.7 C) (Oral)   Resp 18    SpO2 95%   Visual Acuity Right Eye Distance:   Left Eye Distance:   Bilateral Distance:    Right Eye Near:   Left Eye Near:    Bilateral Near:     Physical Exam Constitutional:      General: She is not in acute distress. HENT:     Head: Normocephalic and atraumatic.  Eyes:     General: No scleral icterus.    Pupils: Pupils are equal, round, and reactive to light.  Cardiovascular:     Rate and Rhythm: Normal rate.  Pulmonary:     Effort: Pulmonary effort is normal.  Musculoskeletal:        General: Tenderness and signs of injury present. No swelling. Normal range of motion.     Comments: NVI  Skin:    Capillary Refill: Capillary refill takes less than 2 seconds.     Coloration: Skin is not jaundiced or pale.     Comments: 5 cm superficial skin tear without foreign body, contamination.  Neurological:     General: No focal deficit present.     Mental Status: She is alert and oriented to person, place, and time.      UC Treatments / Results  Labs (all labs ordered are listed, but only abnormal results are displayed) Labs Reviewed - No data to display  EKG   Radiology No results found.  Procedures Laceration Repair  Date/Time: 10/18/2019 1:48 PM Performed by: Quincy Sheehan, PA-C Authorized by: Quincy Sheehan, PA-C   Consent:    Consent obtained:  Verbal   Consent given by:  Patient   Risks discussed:  Infection, need for additional repair, pain, poor cosmetic result and poor wound healing   Alternatives discussed:  No treatment and delayed treatment Universal protocol:    Patient identity confirmed:  Verbally with patient Anesthesia (see MAR for exact dosages):    Anesthesia method:  None Laceration details:    Location:  Shoulder/arm   Shoulder/arm location:  L lower arm   Length (cm):  5   Depth (mm):  2 Repair type:    Repair type:  Simple Pre-procedure details:    Preparation:  Patient was prepped and draped in usual sterile  fashion Exploration:    Hemostasis achieved with:  Direct pressure   Wound exploration: wound explored through full range of motion     Contaminated: no   Treatment:    Area cleansed with:  Soap and water   Amount of cleaning:  Standard   Irrigation solution:  Tap water   Irrigation method:  Tap Skin repair:    Repair method:  Steri-Strips   Number of Steri-Strips:  6 Approximation:    Approximation:  Close Post-procedure details:    Dressing:  Non-adherent dressing   Patient tolerance of procedure:  Tolerated well, no immediate complications   (including critical care time)  Medications Ordered in UC Medications - No data to display  Initial Impression / Assessment and Plan / UC Course  I have reviewed the triage vital signs and the nursing notes.  Pertinent labs & imaging results that were available during my care of the patient were reviewed by me and considered in my medical decision making (see chart for details).     6 Steri-Strips placed in office: Patient tolerated well.  Wound thoroughly irrigated: No indication for antibiotics at this time.  Return precautions discussed, pt verbalized understanding and is agreeable to plan. Final Clinical Impressions(s) / UC Diagnoses   Final diagnoses:  Skin tear of left forearm without complication, initial encounter     Discharge Instructions     Keep skin clean and dry. May change dressing once daily if needed. Steri-Strips may come off after 1 week: We will likely just peel off. Return if you develop worsening pain, redness, discharge, fever.    ED Prescriptions    None     PDMP not reviewed this encounter.   [place], [place], [place] 10/18/19 1348

## 2019-11-26 ENCOUNTER — Other Ambulatory Visit: Payer: Self-pay

## 2019-11-26 MED ORDER — ATORVASTATIN CALCIUM 20 MG PO TABS: 20.0000 mg | ORAL_TABLET | Freq: Every day | ORAL | 3 refills | Status: DC

## 2019-12-07 ENCOUNTER — Other Ambulatory Visit: Payer: Self-pay | Admitting: *Deleted

## 2019-12-07 DIAGNOSIS — Z87891 Personal history of nicotine dependence: Secondary | ICD-10-CM

## 2019-12-20 ENCOUNTER — Ambulatory Visit
Admission: RE | Admit: 2019-12-20 | Discharge: 2019-12-20 | Disposition: A | Discharge status: Home / Self Care | Payer: Medicare Other | Source: Ambulatory Visit | Attending: Acute Care | Admitting: Acute Care

## 2019-12-20 ENCOUNTER — Other Ambulatory Visit: Payer: Self-pay

## 2019-12-20 DIAGNOSIS — J432 Centrilobular emphysema: Secondary | ICD-10-CM | POA: Diagnosis not present

## 2019-12-20 DIAGNOSIS — Z87891 Personal history of nicotine dependence: Secondary | ICD-10-CM | POA: Diagnosis not present

## 2019-12-20 DIAGNOSIS — I2510 Atherosclerotic heart disease of native coronary artery without angina pectoris: Secondary | ICD-10-CM | POA: Diagnosis not present

## 2019-12-20 DIAGNOSIS — I700 Atherosclerosis of aorta: Secondary | ICD-10-CM | POA: Diagnosis not present

## 2019-12-27 NOTE — Progress Notes (Signed)
Please call patient and let them  know their  low dose Ct was read as a Lung RADS 2: nodules that are benign in appearance and behavior with a very low likelihood of becoming a clinically active cancer due to size or lack of growth. Recommendation per radiology is for a repeat LDCT in 12 months. .Please let them  know we will order and schedule their  annual screening scan for 12/2020. Please let them  know there was notation of CAD on their  scan.  Please remind the patient  that this is a non-gated exam therefore degree or severity of disease  cannot be determined. Please have them  follow up with their PCP regarding potential risk factor modification, dietary therapy or pharmacologic therapy if clinically indicated. Pt.  is  currently on statin therapy. Please place order for annual  screening scan for  12/2020 and fax results to PCP. Thanks so much. 

## 2019-12-28 DIAGNOSIS — Z23 Encounter for immunization: Secondary | ICD-10-CM | POA: Diagnosis not present

## 2019-12-31 ENCOUNTER — Encounter: Payer: Self-pay | Admitting: Internal Medicine

## 2019-12-31 ENCOUNTER — Other Ambulatory Visit: Payer: Self-pay | Admitting: *Deleted

## 2019-12-31 DIAGNOSIS — I700 Atherosclerosis of aorta: Secondary | ICD-10-CM | POA: Insufficient documentation

## 2019-12-31 DIAGNOSIS — Z87891 Personal history of nicotine dependence: Secondary | ICD-10-CM

## 2020-01-08 ENCOUNTER — Other Ambulatory Visit: Payer: Self-pay | Admitting: Internal Medicine

## 2020-02-18 NOTE — Patient Instructions (Signed)
  Blood work was ordered.     Medications changes include :     Your prescription(s) have been submitted to your pharmacy. Please take as directed and contact our office if you believe you are having problem(s) with the medication(s).   A referral was ordered for        Someone from their office will call you to schedule an appointment.    Please followup in 6 months  

## 2020-02-18 NOTE — Progress Notes (Signed)
Subjective:    Patient ID: April Combs, female    DOB: 03-15-1946, 74 y.o.   MRN: [id number]  HPI The patient is here for follow up of their chronic medical problems, including htn, hyperlipidemia, prediabetes, gout, GERD, hypothyroid, COPD, chronic back/left leg pain, morbid obesity  She is taking all of her medications as prescribed.      Medications and allergies reviewed with patient and updated if appropriate.  Patient Active Problem List   Diagnosis Date Noted  . Atherosclerosis of aorta ([place]) 12/31/2019  . Physical deconditioning 08/18/2018  . Bilateral leg weakness 02/15/2018  . Prediabetes 08/10/2017  . Hypertensive 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 ([place]) 04/26/2017  . Gout 04/10/2017  . Hypothyroidism 02/09/2017  . Morbid obesity ([place]) 10/15/2015  . Pulmonary emphysema ([place]) 04/29/2015  . BRBPR (bright red blood per rectum) 02/05/2015  . Lumbar radiculopathy 12/24/2014  . Hx of tobacco use, presenting hazards to health 09/29/2014  . Nail fungus 03/11/2014  . FALSE POSITIVE NUCLEAR STRESS CHEST 01/28/2014  . Chest pain on exertion 01/14/2014  . GERD (gastroesophageal reflux disease) 05/15/2012  . Positional vertigo 12/22/2011  . Irregular heart beat 09/29/2011  . Fungal dermatitis 09/29/2011  . Hyperlipidemia with target LDL less than 100 09/29/2011  . SOB (shortness of breath) 08/23/2011  . HYPERTRIGLYCERIDEMIA 07/12/2008  . Essential hypertension 05/28/2008  . OSTEOARTHRITIS 05/28/2008    Current Outpatient Medications on File Prior to Visit  Medication Sig Dispense Refill  . allopurinol (ZYLOPRIM) 300 MG tablet Take 1 tablet by mouth once daily 90 tablet 1  . atorvastatin (LIPITOR) 20 MG tablet Take 1 tablet (20 mg total) by mouth daily. 90 tablet 3  . carvedilol (COREG) 12.5 MG tablet Take 1.5 tablets (18.75 mg total) by mouth 2 (two) times daily with a meal. 270 tablet 3  . Fluticasone-Umeclidin-Vilant (TRELEGY ELLIPTA)  100-62.5-25 MCG/INH AEPB Inhale into the lungs.    . furosemide (LASIX) 20 MG tablet Take 1 tablet (20 mg total) by mouth daily as needed. 90 tablet 3  . gabapentin (NEURONTIN) 100 MG capsule TAKE 1 CAPSULE BY MOUTH IN THE MORNING AND 2 AT BEDTIME 270 capsule 1  . levothyroxine (SYNTHROID) 50 MCG tablet Take 1 tablet (50 mcg total) by mouth daily. 90 tablet 3  . lisinopril-hydrochlorothiazide (ZESTORETIC) 20-25 MG tablet Take 1 tablet by mouth daily. 90 tablet 3  . nitroGLYCERIN (NITROSTAT) 0.4 MG SL tablet DISSOLVE ONE TABLET UNDER THE TONGUE EVERY 5 MINUTES AS NEEDED FOR CHEST PAIN.  DO NOT EXCEED A TOTAL OF 3 DOSES IN 15 MINUTES 25 tablet 0  . omeprazole (PRILOSEC) 20 MG capsule Take 1 capsule (20 mg total) by mouth daily. 90 capsule 3   No current facility-administered medications on file prior to visit.    Past Medical History:  Diagnosis Date  . Anemia   . Anxiety   . BRBPR (bright red blood per rectum) 02/05/2015  . Coronary artery disease (CAD) excluded 01/2014   Low Risk Myoview (false positive) with possible inferior-inferolateral ischemia, but with nonsustained VT --> CATH --> Angiographically Normal Coronary Arteries; coronary CT angiogram showed mild approximately disease but otherwise no obstructive disease.  Coronary calcium score 40.  . Depression   . FALSE POSITIVE NUCLEAR STRESS CHEST 01/28/2014  . Fungal dermatitis 09/29/2011  . GERD (gastroesophageal reflux disease)   . Gout 04/10/2017   First episode - Left foot 03/2017  . Hyperlipidemia   . Hyperlipidemia with target LDL less than 100 09/29/2011  .  Hyperplastic colon polyp   . Hypertension   . Hypertensive 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 ([place]) 04/26/2017  . HYPERTRIGLYCERIDEMIA 07/12/2008   Qualifier: Diagnosis of  By: Birdie Riddle MD, Belenda Cruise    . Hypothyroidism 02/09/2017  . Irregular heart beat 09/29/2011  . Lumbar radiculopathy 12/24/2014  . Morbid obesity ([place]) 10/15/2015  . NICM  (nonischemic cardiomyopathy) ([place])    Essentially resolved. Echocardiogram July 2013 showed normal EF greater than 55%. Important septal motion. Normal LV filling pressures. Mild MR and mild anterior MVP  . Obesity (BMI 30-39.9)   . Osteoarthritis   . Positional vertigo   . Prediabetes 08/10/2017  . Pulmonary emphysema ([place]) 04/29/2015   PFTs 2017 - moderate-severe obstructive disease, some restrictive disease  . SOB (shortness of breath) 08/23/2011    Past Surgical History:  Procedure Laterality Date  . ABDOMINAL HYSTERECTOMY    . APPENDECTOMY  as child  . BREAST BIOPSY Right    benign  . CHOLECYSTECTOMY    . COLONOSCOPY    . CORONARY CALCIUM SCORE/CT ANGIOGRAM  07/2017   Calcium score 40.  Mild LAD stenosis, but no other significant disease.  Jodelle Gross ECHOCARDIOGRAPHY  July 2013   09/20/11. normal Lv thickness and function with EF greater thatn 55%  . Exercise tolerance test - CPET-MET-TEST  July 2013   Submaximal effort. Only 80% of her, therefore peak VO2 of 75% is likely an underestimate. High resting heart rate, achieving 86% of heart rate. --> Unable to interpret due to lack of effort.  [name] GASTRIC BYPASS  1980  . GASTRIC BYPASS    . KIDNEY STONE SURGERY    . LEFT HEART CATHETERIZATION WITH CORONARY ANGIOGRAM N/A 02/01/2014   Procedure: LEFT HEART CATHETERIZATION WITH CORONARY ANGIOGRAM;  Surgeon: Leonie Man, MD;  Location: [facility] CATH LAB;  Service: Cardiovascular: Angiographically normal coronaries  . NM MYOVIEW LTD  November 2015   4:20 min, 4.6 METS --> DOE, but no chest discomfort; Significant + ST -T wave changes noted with NSVT & PVCs. Images suggest Inferior-inferolateral ischemia.  Low Risk. -- FALSE POSITIVE  . Pulmonary Function Tests  July 2013   Increased densities, decreased FVC - consistent with obesity hypoventilation; FEV1 is 58%, FVC 60% of predicted.  . TRANSTHORACIC ECHOCARDIOGRAM  04/2017   Mildly reduced EF of 45%.  No regional wall motion normality.  GR 1  DD  . UPPER GASTROINTESTINAL ENDOSCOPY      Social History   Socioeconomic History  . Marital status: Married    Spouse name: Not on file  . Number of children: 3  . Years of education: Not on file  . Highest education level: Not on file  Occupational History  . Occupation: Retired  Tobacco Use  . Smoking status: Former Smoker    Packs/day: 1.00    Years: 30.00    Pack years: 30.00    Quit date: 01/18/2003    Years since quitting: 17.0  . Smokeless tobacco: Never Used  Substance and Sexual Activity  . Alcohol use: Yes    Alcohol/week: 3.0 standard drinks    Types: 3 Cans of beer per week    Comment: drinks 2-3 beers a week  . Drug use: No  . Sexual activity: Not on file  Other Topics Concern  . Not on file  Social History Narrative   She is married to [name], also patient of Dr. Ellyn Hack.   Mother of 3, grandmother 28.   Quit smoking in 2005. Social alcohol.  No routine exercise.   Social Determinants of Health   Financial Resource Strain:   . Difficulty of Paying Living Expenses: Not on file  Food Insecurity:   . Worried About Charity fundraiser in the Last Year: Not on file  . Ran Out of Food in the Last Year: Not on file  Transportation Needs:   . Lack of Transportation (Medical): Not on file  . Lack of Transportation (Non-Medical): Not on file  Physical Activity:   . Days of Exercise per Week: Not on file  . Minutes of Exercise per Session: Not on file  Stress:   . Feeling of Stress : Not on file  Social Connections:   . Frequency of Communication with Friends and Family: Not on file  . Frequency of Social Gatherings with Friends and Family: Not on file  . Attends Religious Services: Not on file  . Active Member of Clubs or Organizations: Not on file  . Attends Archivist Meetings: Not on file  . Marital Status: Not on file    Family History  Problem Relation Age of Onset  . Kidney disease Daughter   . Multiple sclerosis Daughter   .  Heart disease Father   . Heart disease Mother   . Thyroid disease Mother   . Heart disease Paternal Grandfather   . Colon cancer Other        early 61's.  Genetic testing from maternal side of family.  . Colon cancer Maternal Aunt   . Breast cancer Maternal Aunt   . Lung cancer Sister        smoker  . Diabetes Sister   . Colon cancer Maternal Uncle   . Diabetes Daughter        x3  . Heart disease Maternal Aunt   . Colon polyps Neg Hx   . Esophageal cancer Neg Hx   . Gallbladder disease Neg Hx   . Rectal cancer Neg Hx   . Stomach cancer Neg Hx     Review of Systems     Objective:  There were no vitals filed for this visit. BP Readings from Last 3 Encounters:  10/18/19 (!) 152/83  09/10/19 120/76  08/20/19 122/60   Wt Readings from Last 3 Encounters:  09/10/19 247 lb (112 kg)  08/20/19 249 lb (112.9 kg)  08/20/19 249 lb 3.2 oz (113 kg)   There is no height or weight on file to calculate BMI.   Physical Exam    Constitutional: Appears well-developed and well-nourished. No distress.  HENT:  Head: Normocephalic and atraumatic.  Neck: Neck supple. No tracheal deviation present. No thyromegaly present.  No cervical lymphadenopathy Cardiovascular: Normal rate, regular rhythm and normal heart sounds.   No murmur heard. No carotid bruit .  No edema Pulmonary/Chest: Effort normal and breath sounds normal. No respiratory distress. No has no wheezes. No rales.  Skin: Skin is warm and dry. Not diaphoretic.  Psychiatric: Normal mood and affect. Behavior is normal.      Assessment & Plan:    See Problem List for Assessment and Plan of chronic medical problems.    This visit occurred during the SARS-CoV-2 public health emergency.  Safety protocols were in place, including screening questions prior to the visit, additional usage of staff PPE, and extensive cleaning of exam room while observing appropriate contact time as indicated for disinfecting solutions.    This  encounter was created in error - please disregard.

## 2020-02-19 ENCOUNTER — Encounter: Payer: Self-pay | Admitting: Internal Medicine

## 2020-02-19 ENCOUNTER — Ambulatory Visit (INDEPENDENT_AMBULATORY_CARE_PROVIDER_SITE_OTHER): Payer: Medicare Other | Admitting: Internal Medicine

## 2020-02-19 ENCOUNTER — Other Ambulatory Visit: Payer: Self-pay

## 2020-02-19 ENCOUNTER — Encounter: Payer: Medicare Other | Admitting: Internal Medicine

## 2020-02-19 VITALS — BP 120/82 | HR 79 | Temp 97.9°F | Ht 65.0 in | Wt 247.0 lb

## 2020-02-19 DIAGNOSIS — K219 Gastro-esophageal reflux disease without esophagitis: Secondary | ICD-10-CM

## 2020-02-19 DIAGNOSIS — I10 Essential (primary) hypertension: Secondary | ICD-10-CM | POA: Diagnosis not present

## 2020-02-19 DIAGNOSIS — J439 Emphysema, unspecified: Secondary | ICD-10-CM

## 2020-02-19 DIAGNOSIS — E785 Hyperlipidemia, unspecified: Secondary | ICD-10-CM

## 2020-02-19 DIAGNOSIS — R7303 Prediabetes: Secondary | ICD-10-CM

## 2020-02-19 DIAGNOSIS — M109 Gout, unspecified: Secondary | ICD-10-CM

## 2020-02-19 DIAGNOSIS — M5416 Radiculopathy, lumbar region: Secondary | ICD-10-CM

## 2020-02-19 DIAGNOSIS — E039 Hypothyroidism, unspecified: Secondary | ICD-10-CM

## 2020-02-19 DIAGNOSIS — I700 Atherosclerosis of aorta: Secondary | ICD-10-CM | POA: Diagnosis not present

## 2020-02-19 LAB — HEMOGLOBIN A1C: Hgb A1c MFr Bld: 6.2 % (ref 4.6–6.5)

## 2020-02-19 LAB — LIPID PANEL
Cholesterol: 171 mg/dL (ref 0–200)
HDL: 54.3 mg/dL (ref >39.00)
LDL Cholesterol: 83 mg/dL (ref 0–99)
NonHDL: 116.41
Total CHOL/HDL Ratio: 3
Triglycerides: 168 mg/dL — ABNORMAL HIGH (ref 0.0–149.0)
VLDL: 33.6 mg/dL (ref 0.0–40.0)

## 2020-02-19 LAB — CBC WITH DIFFERENTIAL/PLATELET
Basophils Absolute: 0.1 10*3/uL (ref 0.0–0.1)
Basophils Relative: 0.9 % (ref 0.0–3.0)
Eosinophils Absolute: 0.3 10*3/uL (ref 0.0–0.7)
Eosinophils Relative: 4.5 % (ref 0.0–5.0)
HCT: 39.7 % (ref 36.0–46.0)
Hemoglobin: 13.2 g/dL (ref 12.0–15.0)
Lymphocytes Relative: 22.7 % (ref 12.0–46.0)
Lymphs Abs: 1.3 10*3/uL (ref 0.7–4.0)
MCHC: 33.3 g/dL (ref 30.0–36.0)
MCV: 96.9 fl (ref 78.0–100.0)
Monocytes Absolute: 0.4 10*3/uL (ref 0.1–1.0)
Monocytes Relative: 6.8 % (ref 3.0–12.0)
Neutro Abs: 3.7 10*3/uL (ref 1.4–7.7)
Neutrophils Relative %: 65.1 % (ref 43.0–77.0)
Platelets: 174 10*3/uL (ref 150.0–400.0)
RBC: 4.1 Mil/uL (ref 3.87–5.11)
RDW: 15.7 % — ABNORMAL HIGH (ref 11.5–15.5)
WBC: 5.8 10*3/uL (ref 4.0–10.5)

## 2020-02-19 LAB — COMPREHENSIVE METABOLIC PANEL
ALT: 17 U/L (ref 0–35)
AST: 22 U/L (ref 0–37)
Albumin: 4.4 g/dL (ref 3.5–5.2)
Alkaline Phosphatase: 81 U/L (ref 39–117)
BUN: 19 mg/dL (ref 6–23)
CO2: 26 mEq/L (ref 19–32)
Calcium: 9.2 mg/dL (ref 8.4–10.5)
Chloride: 102 mEq/L (ref 96–112)
Creatinine, Ser: 0.85 mg/dL (ref 0.40–1.20)
GFR: 67.92 mL/min (ref >60.00)
Glucose, Bld: 103 mg/dL — ABNORMAL HIGH (ref 70–99)
Potassium: 4.5 mEq/L (ref 3.5–5.1)
Sodium: 139 mEq/L (ref 135–145)
Total Bilirubin: 0.6 mg/dL (ref 0.2–1.2)
Total Protein: 7.2 g/dL (ref 6.0–8.3)

## 2020-02-19 LAB — URIC ACID: Uric Acid, Serum: 4.9 mg/dL (ref 2.4–7.0)

## 2020-02-19 LAB — TSH: TSH: 4.06 u[IU]/mL (ref 0.35–4.50)

## 2020-02-19 MED ORDER — OMEPRAZOLE 20 MG PO CPDR: 20.0000 mg | DELAYED_RELEASE_CAPSULE | Freq: Two times a day (BID) | ORAL | 3 refills | Status: DC

## 2020-02-19 MED ORDER — GABAPENTIN 100 MG PO CAPS: ORAL_CAPSULE | ORAL | 1 refills | Status: DC

## 2020-02-19 MED ORDER — ATORVASTATIN CALCIUM 20 MG PO TABS: 20.0000 mg | ORAL_TABLET | Freq: Every day | ORAL | 3 refills | Status: DC

## 2020-02-19 MED ORDER — ALLOPURINOL 300 MG PO TABS: 300.0000 mg | ORAL_TABLET | Freq: Every day | ORAL | 3 refills | Status: DC

## 2020-02-19 NOTE — Assessment & Plan Note (Signed)
Chronic Taking atorvastatin 20 mg daily LDL near goal of 70 She is not compliant with a low-fat/cholesterol diet.  Encouraged her to eat a more healthy diet She is limited in her activity Chronic back pain, but she is trying to be as active as possible

## 2020-02-19 NOTE — Assessment & Plan Note (Signed)
Chronic BP well controlled Continue carvedilol 18.75 milligrams twice daily, Lasix 20 mg daily, lisinopril-hydrochlorothiazide 20-25 mg daily cmp

## 2020-02-19 NOTE — Assessment & Plan Note (Signed)
Chronic Pain has improved compared to her last visit At this point her pain is controlled and tolerable Continue gabapentin 100 mg in the morning and 200 mg at bedtime

## 2020-02-19 NOTE — Progress Notes (Signed)
Subjective:    Patient ID: April Combs, female    DOB: 1945/09/02, 74 y.o.   MRN: [id number]  HPI The patient is here for follow up of their chronic medical problems, including htn, hyperlipidemia, prediabetes, gout, GERD, hypothyroid, COPD, chronic back/left leg pain, morbid obesity  She is taking all of her medications as prescribed.    The gabapentin is helping her back pain - the pain is toleratble.    Her gerd is not controlled - she wondered about increasing her medication.  She has no concerns.  Medications and allergies reviewed with patient and updated if appropriate.  Patient Active Problem List   Diagnosis Date Noted  . Atherosclerosis of aorta ([place]) 12/31/2019  . Physical deconditioning 08/18/2018  . Bilateral leg weakness 02/15/2018  . Prediabetes 08/10/2017  . Hypertensive 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 ([place]) 04/26/2017  . Gout 04/10/2017  . Hypothyroidism 02/09/2017  . Morbid obesity ([place]) 10/15/2015  . Pulmonary emphysema ([place]) 04/29/2015  . BRBPR (bright red blood per rectum) 02/05/2015  . Lumbar radiculopathy 12/24/2014  . Hx of tobacco use, presenting hazards to health 09/29/2014  . Nail fungus 03/11/2014  . FALSE POSITIVE NUCLEAR STRESS CHEST 01/28/2014  . Chest pain on exertion 01/14/2014  . GERD (gastroesophageal reflux disease) 05/15/2012  . Positional vertigo 12/22/2011  . Irregular heart beat 09/29/2011  . Hyperlipidemia with target LDL less than 100 09/29/2011  . SOB (shortness of breath) 08/23/2011  . HYPERTRIGLYCERIDEMIA 07/12/2008  . Essential hypertension 05/28/2008  . OSTEOARTHRITIS 05/28/2008    Current Outpatient Medications on File Prior to Visit  Medication Sig Dispense Refill  . allopurinol (ZYLOPRIM) 300 MG tablet Take 1 tablet by mouth once daily 90 tablet 1  . atorvastatin (LIPITOR) 20 MG tablet Take 1 tablet (20 mg total) by mouth daily. 90 tablet 3  . carvedilol (COREG) 12.5 MG tablet Take  1.5 tablets (18.75 mg total) by mouth 2 (two) times daily with a meal. 270 tablet 3  . Fluticasone-Umeclidin-Vilant (TRELEGY ELLIPTA) 100-62.5-25 MCG/INH AEPB Inhale into the lungs.    . furosemide (LASIX) 20 MG tablet Take 1 tablet (20 mg total) by mouth daily as needed. 90 tablet 3  . gabapentin (NEURONTIN) 100 MG capsule TAKE 1 CAPSULE BY MOUTH IN THE MORNING AND 2 AT BEDTIME 270 capsule 1  . levothyroxine (SYNTHROID) 50 MCG tablet Take 1 tablet (50 mcg total) by mouth daily. 90 tablet 3  . lisinopril-hydrochlorothiazide (ZESTORETIC) 20-25 MG tablet Take 1 tablet by mouth daily. 90 tablet 3  . nitroGLYCERIN (NITROSTAT) 0.4 MG SL tablet DISSOLVE ONE TABLET UNDER THE TONGUE EVERY 5 MINUTES AS NEEDED FOR CHEST PAIN.  DO NOT EXCEED A TOTAL OF 3 DOSES IN 15 MINUTES 25 tablet 0   No current facility-administered medications on file prior to visit.    Past Medical History:  Diagnosis Date  . Anemia   . Anxiety   . BRBPR (bright red blood per rectum) 02/05/2015  . Coronary artery disease (CAD) excluded 01/2014   Low Risk Myoview (false positive) with possible inferior-inferolateral ischemia, but with nonsustained VT --> CATH --> Angiographically Normal Coronary Arteries; coronary CT angiogram showed mild approximately disease but otherwise no obstructive disease.  Coronary calcium score 40.  . Depression   . FALSE POSITIVE NUCLEAR STRESS CHEST 01/28/2014  . Fungal dermatitis 09/29/2011  . GERD (gastroesophageal reflux disease)   . Gout 04/10/2017   First episode - Left foot 03/2017  . Hyperlipidemia   . Hyperlipidemia with  target LDL less than 100 09/29/2011  . Hyperplastic colon polyp   . Hypertension   . Hypertensive 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 ([place]) 04/26/2017  . HYPERTRIGLYCERIDEMIA 07/12/2008   Qualifier: Diagnosis of  By: Birdie Riddle MD, Belenda Cruise    . Hypothyroidism 02/09/2017  . Irregular heart beat 09/29/2011  . Lumbar radiculopathy 12/24/2014  . Morbid  obesity ([place]) 10/15/2015  . NICM (nonischemic cardiomyopathy) ([place])    Essentially resolved. Echocardiogram July 2013 showed normal EF greater than 55%. Important septal motion. Normal LV filling pressures. Mild MR and mild anterior MVP  . Obesity (BMI 30-39.9)   . Osteoarthritis   . Positional vertigo   . Prediabetes 08/10/2017  . Pulmonary emphysema ([place]) 04/29/2015   PFTs 2017 - moderate-severe obstructive disease, some restrictive disease  . SOB (shortness of breath) 08/23/2011    Past Surgical History:  Procedure Laterality Date  . ABDOMINAL HYSTERECTOMY    . APPENDECTOMY  as child  . BREAST BIOPSY Right    benign  . CHOLECYSTECTOMY    . COLONOSCOPY    . CORONARY CALCIUM SCORE/CT ANGIOGRAM  07/2017   Calcium score 40.  Mild LAD stenosis, but no other significant disease.  Jodelle Gross ECHOCARDIOGRAPHY  July 2013   09/20/11. normal Lv thickness and function with EF greater thatn 55%  . Exercise tolerance test - CPET-MET-TEST  July 2013   Submaximal effort. Only 80% of her, therefore peak VO2 of 75% is likely an underestimate. High resting heart rate, achieving 86% of heart rate. --> Unable to interpret due to lack of effort.  [name] GASTRIC BYPASS  1980  . GASTRIC BYPASS    . KIDNEY STONE SURGERY    . LEFT HEART CATHETERIZATION WITH CORONARY ANGIOGRAM N/A 02/01/2014   Procedure: LEFT HEART CATHETERIZATION WITH CORONARY ANGIOGRAM;  Surgeon: Leonie Man, MD;  Location: [facility] CATH LAB;  Service: Cardiovascular: Angiographically normal coronaries  . NM MYOVIEW LTD  November 2015   4:20 min, 4.6 METS --> DOE, but no chest discomfort; Significant + ST -T wave changes noted with NSVT & PVCs. Images suggest Inferior-inferolateral ischemia.  Low Risk. -- FALSE POSITIVE  . Pulmonary Function Tests  July 2013   Increased densities, decreased FVC - consistent with obesity hypoventilation; FEV1 is 58%, FVC 60% of predicted.  . TRANSTHORACIC ECHOCARDIOGRAM  04/2017   Mildly reduced EF of 45%.  No  regional wall motion normality.  GR 1 DD  . UPPER GASTROINTESTINAL ENDOSCOPY      Social History   Socioeconomic History  . Marital status: Married    Spouse name: Not on file  . Number of children: 3  . Years of education: Not on file  . Highest education level: Not on file  Occupational History  . Occupation: Retired  Tobacco Use  . Smoking status: Former Smoker    Packs/day: 1.00    Years: 30.00    Pack years: 30.00    Quit date: 01/18/2003    Years since quitting: 17.0  . Smokeless tobacco: Never Used  Substance and Sexual Activity  . Alcohol use: Yes    Alcohol/week: 3.0 standard drinks    Types: 3 Cans of beer per week    Comment: drinks 2-3 beers a week  . Drug use: No  . Sexual activity: Not on file  Other Topics Concern  . Not on file  Social History Narrative   She is married to [name], also patient of Dr. Ellyn Hack.   Mother of 3, grandmother 75.  Quit smoking in 2005. Social alcohol.   No routine exercise.   Social Determinants of Health   Financial Resource Strain:   . Difficulty of Paying Living Expenses: Not on file  Food Insecurity:   . Worried About Charity fundraiser in the Last Year: Not on file  . Ran Out of Food in the Last Year: Not on file  Transportation Needs:   . Lack of Transportation (Medical): Not on file  . Lack of Transportation (Non-Medical): Not on file  Physical Activity:   . Days of Exercise per Week: Not on file  . Minutes of Exercise per Session: Not on file  Stress:   . Feeling of Stress : Not on file  Social Connections:   . Frequency of Communication with Friends and Family: Not on file  . Frequency of Social Gatherings with Friends and Family: Not on file  . Attends Religious Services: Not on file  . Active Member of Clubs or Organizations: Not on file  . Attends Archivist Meetings: Not on file  . Marital Status: Not on file    Family History  Problem Relation Age of Onset  . Kidney disease Daughter    . Multiple sclerosis Daughter   . Heart disease Father   . Heart disease Mother   . Thyroid disease Mother   . Heart disease Paternal Grandfather   . Colon cancer Other        early 31's.  Genetic testing from maternal side of family.  . Colon cancer Maternal Aunt   . Breast cancer Maternal Aunt   . Lung cancer Sister        smoker  . Diabetes Sister   . Colon cancer Maternal Uncle   . Diabetes Daughter        x3  . Heart disease Maternal Aunt   . Colon polyps Neg Hx   . Esophageal cancer Neg Hx   . Gallbladder disease Neg Hx   . Rectal cancer Neg Hx   . Stomach cancer Neg Hx     Review of Systems  Constitutional: Negative for chills and fever.  Respiratory: Positive for shortness of breath. Negative for cough, chest tightness and wheezing.   Cardiovascular: Positive for chest pain (one episode) and palpitations (frequent). Negative for leg swelling.  Gastrointestinal: Negative for abdominal pain and nausea.  Neurological: Positive for headaches (intermittent - sinus). Negative for dizziness and light-headedness.       Objective:   Vitals:   02/19/20 1104  BP: 120/82  Pulse: 79  Temp: 97.9 F (36.6 C)  SpO2: 97%   BP Readings from Last 3 Encounters:  02/19/20 120/82  10/18/19 (!) 152/83  09/10/19 120/76   Wt Readings from Last 3 Encounters:  02/19/20 247 lb (112 kg)  09/10/19 247 lb (112 kg)  08/20/19 249 lb (112.9 kg)   Body mass index is 41.1 kg/m.   Physical Exam    Constitutional: Appears well-developed and well-nourished. No distress.  HENT:  Head: Normocephalic and atraumatic.  Neck: Neck supple. No tracheal deviation present. No thyromegaly present.  No cervical lymphadenopathy Cardiovascular: Normal rate, regular rhythm and normal heart sounds.   No murmur heard. No carotid bruit .  No edema Pulmonary/Chest: Effort normal and breath sounds normal. No respiratory distress. No has no wheezes. No rales.  Skin: Skin is warm and dry. Not  diaphoretic.  Psychiatric: Normal mood and affect. Behavior is normal.      Assessment & Plan:  See Problem List for Assessment and Plan of chronic medical problems.    This visit occurred during the SARS-CoV-2 public health emergency.  Safety protocols were in place, including screening questions prior to the visit, additional usage of staff PPE, and extensive cleaning of exam room while observing appropriate contact time as indicated for disinfecting solutions.

## 2020-02-19 NOTE — Patient Instructions (Signed)
  Blood work was ordered.     Medications changes include :   Increase omeprazole to 20 mg twice daily  Your prescription(s) have been submitted to your pharmacy. Please take as directed and contact our office if you believe you are having problem(s) with the medication(s).    Please followup in 6 months

## 2020-02-19 NOTE — Assessment & Plan Note (Signed)
Chronic Check a1c Low sugar / carb diet Stressed regular exercise  

## 2020-02-19 NOTE — Assessment & Plan Note (Signed)
Chronic GERD controlled Continue omeprazole 20 mg daily  

## 2020-02-19 NOTE — Assessment & Plan Note (Signed)
Chronic Shortness of breath with activity-stable Continue Trelegy daily Continue albuterol inhaler as needed

## 2020-02-19 NOTE — Assessment & Plan Note (Signed)
Chronic Check lipid panel  Continue atorvastatin 20 mg daily Regular exercise and healthy diet encouraged  

## 2020-02-19 NOTE — Assessment & Plan Note (Signed)
Chronic On atorvastatin 20 mg daily LDL has been near goal - check lipids today Encouraged increased exercise and heart healthy diet

## 2020-02-19 NOTE — Assessment & Plan Note (Signed)
Chronic  Clinically euthyroid Currently taking levothyroxine 50 mcg daily Check tsh  Titrate med dose if needed  

## 2020-02-19 NOTE — Assessment & Plan Note (Signed)
>>  ASSESSMENT AND PLAN FOR PULMONARY EMPHYSEMA ([facility]) WRITTEN ON 02/19/2020 11:16 AM BY BURNS, GLADE PARAS, MD  Chronic Shortness of breath with activity-stable Continue Trelegy daily Continue albuterol  inhaler as needed

## 2020-02-19 NOTE — Assessment & Plan Note (Signed)
Chronic No gout symptoms since she was here last Continue allopurinol 300 mg daily Check uric acid level

## 2020-02-19 NOTE — Assessment & Plan Note (Signed)
Chronic BP well controlled Continue coreg 12. 5 mg BID, lasix 20 mg qd, lisinopril-hctz 20-25 mg daily cmp

## 2020-02-19 NOTE — Assessment & Plan Note (Signed)
Chronic Check a1c Low sugar / carb diet Stressed regular exercise Advised weight loss 

## 2020-02-19 NOTE — Assessment & Plan Note (Signed)
Chronic GERD not controlled Continue omeprazole, but increase to 20 mg twice daily

## 2020-02-19 NOTE — Assessment & Plan Note (Signed)
Chronic Taking allopurinol 300 mg daily No gout symptoms since her last visit Check uric acid level

## 2020-03-17 ENCOUNTER — Other Ambulatory Visit: Payer: Self-pay

## 2020-03-17 DIAGNOSIS — I10 Essential (primary) hypertension: Secondary | ICD-10-CM

## 2020-03-17 MED ORDER — CARVEDILOL 12.5 MG PO TABS: 18.7500 mg | ORAL_TABLET | Freq: Two times a day (BID) | ORAL | 0 refills | Status: DC

## 2020-03-17 MED ORDER — LISINOPRIL-HYDROCHLOROTHIAZIDE 20-25 MG PO TABS: 1.0000 | ORAL_TABLET | Freq: Every day | ORAL | 0 refills | Status: DC

## 2020-03-19 ENCOUNTER — Ambulatory Visit: Payer: Medicare Other

## 2020-03-29 DIAGNOSIS — Z23 Encounter for immunization: Secondary | ICD-10-CM | POA: Diagnosis not present

## 2020-04-21 ENCOUNTER — Other Ambulatory Visit: Payer: Self-pay | Admitting: Internal Medicine

## 2020-04-21 DIAGNOSIS — Z1231 Encounter for screening mammogram for malignant neoplasm of breast: Secondary | ICD-10-CM

## 2020-04-23 ENCOUNTER — Ambulatory Visit: Payer: Medicare Other

## 2020-04-26 ENCOUNTER — Other Ambulatory Visit: Payer: Self-pay | Admitting: Cardiology

## 2020-04-26 DIAGNOSIS — I10 Essential (primary) hypertension: Secondary | ICD-10-CM

## 2020-05-12 ENCOUNTER — Other Ambulatory Visit: Payer: Self-pay | Admitting: Cardiology

## 2020-05-12 DIAGNOSIS — I10 Essential (primary) hypertension: Secondary | ICD-10-CM

## 2020-06-05 ENCOUNTER — Ambulatory Visit: Payer: Medicare Other

## 2020-07-07 ENCOUNTER — Telehealth: Payer: Self-pay | Admitting: Internal Medicine

## 2020-07-07 NOTE — Progress Notes (Signed)
  Chronic Care Management   Note  07/07/2020 Name: April Combs MRN: [id number] DOB: 05/30/1945  April Combs is a 75 y.o. year old female who is a primary care patient of Burns, Claudina Lick, MD. I reached out to April Combs by phone today in response to a referral sent by Ms. April Combs's PCP, Binnie Rail, MD.   April Combs was given information about Chronic Care Management services today including:  1. CCM service includes personalized support from designated clinical staff supervised by her physician, including individualized plan of care and coordination with other care providers 2. 24/7 contact phone numbers for assistance for urgent and routine care needs. 3. Service will only be billed when office clinical staff spend 20 minutes or more in a month to coordinate care. 4. Only one practitioner may furnish and bill the service in a calendar month. 5. The patient may stop CCM services at any time (effective at the end of the month) by phone call to the office staff.   Patient agreed to services and verbal consent obtained.   Follow up plan:   April Combs [employer] Scheduler

## 2020-07-18 ENCOUNTER — Other Ambulatory Visit: Payer: Self-pay | Admitting: Internal Medicine

## 2020-07-18 ENCOUNTER — Other Ambulatory Visit: Payer: Self-pay | Admitting: Cardiology

## 2020-07-18 DIAGNOSIS — I10 Essential (primary) hypertension: Secondary | ICD-10-CM

## 2020-08-02 ENCOUNTER — Other Ambulatory Visit: Payer: Self-pay | Admitting: Cardiology

## 2020-08-02 DIAGNOSIS — I10 Essential (primary) hypertension: Secondary | ICD-10-CM

## 2020-08-04 NOTE — Telephone Encounter (Signed)
Rx(s) sent to pharmacy electronically.  

## 2020-08-18 ENCOUNTER — Telehealth: Payer: Self-pay | Admitting: Pharmacist

## 2020-08-18 NOTE — Progress Notes (Signed)
    Chronic Care Management Pharmacy Assistant   Name: April Combs  MRN: [id number] DOB: 07-30-1945  Reason for Encounter: Initial Questions Appointment: Telephone 08/19/20 @ 9 am  Recent office visits:  02/19/20 Burns (PCP) - F/u Hyperlipemia. F/u 6 mos. Increase Omeprazole to 20 mg 2x day.  Recent consult visits:  None noted.  Hospital visits:  None in previous 6 months  Medications: Outpatient Encounter Medications as of 08/18/2020  Medication Sig  . allopurinol (ZYLOPRIM) 300 MG tablet Take 1 tablet (300 mg total) by mouth daily.  [name] atorvastatin (LIPITOR) 20 MG tablet Take 1 tablet (20 mg total) by mouth daily.  . carvedilol (COREG) 12.5 MG tablet TAKE 1 AND 1/2 TABLETS BY  MOUTH TWICE DAILY WITH A  MEAL  . furosemide (LASIX) 20 MG tablet Take 1 tablet (20 mg total) by mouth daily as needed.  . gabapentin (NEURONTIN) 100 MG capsule TAKE 1 CAPSULE BY MOUTH IN  THE MORNING AND 2 CAPSULES  BY MOUTH AT BEDTIME  . levothyroxine (SYNTHROID) 50 MCG tablet Take 1 tablet (50 mcg total) by mouth daily.  [name] lisinopril-hydrochlorothiazide (ZESTORETIC) 20-25 MG tablet TAKE 1 TABLET BY MOUTH  DAILY  . nitroGLYCERIN (NITROSTAT) 0.4 MG SL tablet DISSOLVE ONE TABLET UNDER THE TONGUE EVERY 5 MINUTES AS NEEDED FOR CHEST PAIN.  DO NOT EXCEED A TOTAL OF 3 DOSES IN 15 MINUTES  . omeprazole (PRILOSEC) 20 MG capsule Take 1 capsule (20 mg total) by mouth 2 (two) times daily before a meal.   No facility-administered encounter medications on file as of 08/18/2020.    Have you seen any other providers since your last visit? Patient states she has not seen any other providers.  Any changes in your medications or health?  Patient states no changes.  Any side effects from any medications?  Patient states no side effects.  Do you have an symptoms or problems not managed by your medications?  Patient states no problems or symptoms.  Any concerns about your health right now?  Patient states no concerns  at this time.  Has your provider asked that you check blood pressure, blood sugar, or follow special diet at home?  Patient states she does not follow a special diet. Patient states she does not check her BP or glucose at home.  Do you get any type of exercise on a regular basis?  Patient states she walks to mailbox daily.  Can you think of a goal you would like to reach for your health? Patient states she's doing just fine.   Do you have any problems getting your medications? Patient states no problems with pharmacy.  Is there anything that you would like to discuss during the appointment?  Patient states no concerns at this time.  Please bring medications and supplements to appointment  Star Rating Drugs: Atorvastatin - last fill 06/16/20 90D Lisinopril-hctz - last fill 06/12/20 [place], RMA Clinical Pharmacists Assistant [phone]  Time Spent: 52

## 2020-08-19 ENCOUNTER — Other Ambulatory Visit: Payer: Self-pay

## 2020-08-19 ENCOUNTER — Ambulatory Visit (INDEPENDENT_AMBULATORY_CARE_PROVIDER_SITE_OTHER): Payer: Medicare Other | Admitting: Pharmacist

## 2020-08-19 DIAGNOSIS — I10 Essential (primary) hypertension: Secondary | ICD-10-CM

## 2020-08-19 DIAGNOSIS — E785 Hyperlipidemia, unspecified: Secondary | ICD-10-CM

## 2020-08-19 DIAGNOSIS — I700 Atherosclerosis of aorta: Secondary | ICD-10-CM

## 2020-08-19 DIAGNOSIS — E039 Hypothyroidism, unspecified: Secondary | ICD-10-CM | POA: Diagnosis not present

## 2020-08-19 DIAGNOSIS — I5032 Chronic diastolic (congestive) heart failure: Secondary | ICD-10-CM

## 2020-08-19 DIAGNOSIS — I110 Hypertensive heart disease with heart failure: Secondary | ICD-10-CM | POA: Diagnosis not present

## 2020-08-19 DIAGNOSIS — J439 Emphysema, unspecified: Secondary | ICD-10-CM | POA: Diagnosis not present

## 2020-08-19 DIAGNOSIS — R7303 Prediabetes: Secondary | ICD-10-CM

## 2020-08-19 DIAGNOSIS — K219 Gastro-esophageal reflux disease without esophagitis: Secondary | ICD-10-CM

## 2020-08-19 NOTE — Progress Notes (Signed)
Subjective:    Patient ID: April Combs, female    DOB: October 01, 1945, 75 y.o.   MRN: [id number]  HPI The patient is here for follow up of their chronic medical problems, including htn, hld, prediabetes, gout, gerd, hypothyroid, COPD, chronic back/left leg pain, morbid obesity  She uses trelegy as needed and it works well.    She can not walk long or stand long due to back pain.   Medications and allergies reviewed with patient and updated if appropriate.  Patient Active Problem List   Diagnosis Date Noted  . Atherosclerosis of aorta ([place]) 12/31/2019  . Physical deconditioning 08/18/2018  . Bilateral leg weakness 02/15/2018  . Prediabetes 08/10/2017  . Hypertensive 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 ([place]) 04/26/2017  . Gout 04/10/2017  . Hypothyroidism 02/09/2017  . Morbid obesity ([place]) 10/15/2015  . Pulmonary emphysema ([place]) 04/29/2015  . BRBPR (bright red blood per rectum) 02/05/2015  . Lumbar radiculopathy 12/24/2014  . Hx of tobacco use, presenting hazards to health 09/29/2014  . Nail fungus 03/11/2014  . FALSE POSITIVE NUCLEAR STRESS CHEST 01/28/2014  . Chest pain on exertion 01/14/2014  . GERD (gastroesophageal reflux disease) 05/15/2012  . Positional vertigo 12/22/2011  . Irregular heart beat 09/29/2011  . Hyperlipidemia with target LDL less than 100 09/29/2011  . SOB (shortness of breath) 08/23/2011  . Essential hypertension 05/28/2008  . OSTEOARTHRITIS 05/28/2008    Current Outpatient Medications on File Prior to Visit  Medication Sig Dispense Refill  . albuterol (VENTOLIN HFA) 108 (90 Base) MCG/ACT inhaler Inhale 2 puffs into the lungs every 6 (six) hours as needed for wheezing or shortness of breath.    . allopurinol (ZYLOPRIM) 300 MG tablet Take 1 tablet (300 mg total) by mouth daily. 90 tablet 3  . atorvastatin (LIPITOR) 20 MG tablet Take 1 tablet (20 mg total) by mouth daily. 90 tablet 3  . carvedilol (COREG) 12.5 MG tablet  TAKE 1 AND 1/2 TABLETS BY  MOUTH TWICE DAILY WITH A  MEAL 270 tablet 3  . Fluticasone-Umeclidin-Vilant (TRELEGY ELLIPTA) 100-62.5-25 MCG/INH AEPB Inhale 1 puff into the lungs daily as needed.    . furosemide (LASIX) 20 MG tablet Take 1 tablet (20 mg total) by mouth daily as needed. 90 tablet 3  . gabapentin (NEURONTIN) 100 MG capsule TAKE 1 CAPSULE BY MOUTH IN  THE MORNING AND 2 CAPSULES  BY MOUTH AT BEDTIME 270 capsule 3  . levothyroxine (SYNTHROID) 50 MCG tablet Take 1 tablet (50 mcg total) by mouth daily. 90 tablet 3  . lisinopril-hydrochlorothiazide (ZESTORETIC) 20-25 MG tablet TAKE 1 TABLET BY MOUTH  DAILY 90 tablet 0  . naproxen sodium (ALEVE) 220 MG tablet Take 220 mg by mouth daily as needed.    . nitroGLYCERIN (NITROSTAT) 0.4 MG SL tablet DISSOLVE ONE TABLET UNDER THE TONGUE EVERY 5 MINUTES AS NEEDED FOR CHEST PAIN.  DO NOT EXCEED A TOTAL OF 3 DOSES IN 15 MINUTES 25 tablet 0  . omeprazole (PRILOSEC) 20 MG capsule Take 1 capsule (20 mg total) by mouth 2 (two) times daily before a meal. 180 capsule 3  . albuterol (VENTOLIN HFA) 108 (90 Base) MCG/ACT inhaler INHALE TWO PUFFS BY MOUTH EVERY 6 HOURS AS NEEDED (Patient not taking: Reported on 08/20/2020)     No current facility-administered medications on file prior to visit.    Past Medical History:  Diagnosis Date  . Anemia   . Anxiety   . BRBPR (bright red blood per rectum) 02/05/2015  .  Coronary artery disease (CAD) excluded 01/2014   Low Risk Myoview (false positive) with possible inferior-inferolateral ischemia, but with nonsustained VT --> CATH --> Angiographically Normal Coronary Arteries; coronary CT angiogram showed mild approximately disease but otherwise no obstructive disease.  Coronary calcium score 40.  . Depression   . FALSE POSITIVE NUCLEAR STRESS CHEST 01/28/2014  . Fungal dermatitis 09/29/2011  . GERD (gastroesophageal reflux disease)   . Gout 04/10/2017   First episode - Left foot 03/2017  . Hyperlipidemia   .  Hyperlipidemia with target LDL less than 100 09/29/2011  . Hyperplastic colon polyp   . Hypertension   . Hypertensive 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 ([place]) 04/26/2017  . HYPERTRIGLYCERIDEMIA 07/12/2008   Qualifier: Diagnosis of  By: Birdie Riddle MD, Belenda Cruise    . Hypothyroidism 02/09/2017  . Irregular heart beat 09/29/2011  . Lumbar radiculopathy 12/24/2014  . Morbid obesity ([place]) 10/15/2015  . NICM (nonischemic cardiomyopathy) ([place])    Essentially resolved. Echocardiogram July 2013 showed normal EF greater than 55%. Important septal motion. Normal LV filling pressures. Mild MR and mild anterior MVP  . Obesity (BMI 30-39.9)   . Osteoarthritis   . Positional vertigo   . Prediabetes 08/10/2017  . Pulmonary emphysema ([place]) 04/29/2015   PFTs 2017 - moderate-severe obstructive disease, some restrictive disease  . SOB (shortness of breath) 08/23/2011    Past Surgical History:  Procedure Laterality Date  . ABDOMINAL HYSTERECTOMY    . APPENDECTOMY  as child  . BREAST BIOPSY Right    benign  . CHOLECYSTECTOMY    . COLONOSCOPY    . CORONARY CALCIUM SCORE/CT ANGIOGRAM  07/2017   Calcium score 40.  Mild LAD stenosis, but no other significant disease.  Jodelle Gross ECHOCARDIOGRAPHY  July 2013   09/20/11. normal Lv thickness and function with EF greater thatn 55%  . Exercise tolerance test - CPET-MET-TEST  July 2013   Submaximal effort. Only 80% of her, therefore peak VO2 of 75% is likely an underestimate. High resting heart rate, achieving 86% of heart rate. --> Unable to interpret due to lack of effort.  [name] GASTRIC BYPASS  1980  . GASTRIC BYPASS    . KIDNEY STONE SURGERY    . LEFT HEART CATHETERIZATION WITH CORONARY ANGIOGRAM N/A 02/01/2014   Procedure: LEFT HEART CATHETERIZATION WITH CORONARY ANGIOGRAM;  Surgeon: Leonie Man, MD;  Location: [facility] CATH LAB;  Service: Cardiovascular: Angiographically normal coronaries  . NM MYOVIEW LTD  November 2015   4:20 min, 4.6 METS -->  DOE, but no chest discomfort; Significant + ST -T wave changes noted with NSVT & PVCs. Images suggest Inferior-inferolateral ischemia.  Low Risk. -- FALSE POSITIVE  . Pulmonary Function Tests  July 2013   Increased densities, decreased FVC - consistent with obesity hypoventilation; FEV1 is 58%, FVC 60% of predicted.  . TRANSTHORACIC ECHOCARDIOGRAM  04/2017   Mildly reduced EF of 45%.  No regional wall motion normality.  GR 1 DD  . UPPER GASTROINTESTINAL ENDOSCOPY      Social History   Socioeconomic History  . Marital status: Married    Spouse name: Not on file  . Number of children: 3  . Years of education: Not on file  . Highest education level: Not on file  Occupational History  . Occupation: Retired  Tobacco Use  . Smoking status: Former Smoker    Packs/day: 1.00    Years: 30.00    Pack years: 30.00    Quit date: 01/18/2003    Years since  quitting: 17.6  . Smokeless tobacco: Never Used  Substance and Sexual Activity  . Alcohol use: Yes    Alcohol/week: 3.0 standard drinks    Types: 3 Cans of beer per week    Comment: drinks 2-3 beers a week  . Drug use: No  . Sexual activity: Not on file  Other Topics Concern  . Not on file  Social History Narrative   She is married to [name], also patient of Dr. Ellyn Hack.   Mother of 3, grandmother 69.   Quit smoking in 2005. Social alcohol.   No routine exercise.   Social Determinants of Health   Financial Resource Strain: Medium Risk  . Difficulty of Paying Living Expenses: Somewhat hard  Food Insecurity: Not on file  Transportation Needs: Not on file  Physical Activity: Not on file  Stress: Not on file  Social Connections: Not on file    Family History  Problem Relation Age of Onset  . Kidney disease Daughter   . Multiple sclerosis Daughter   . Heart disease Father   . Heart disease Mother   . Thyroid disease Mother   . Heart disease Paternal Grandfather   . Colon cancer Other        early 52's.  Genetic testing  from maternal side of family.  . Colon cancer Maternal Aunt   . Breast cancer Maternal Aunt   . Lung cancer Sister        smoker  . Diabetes Sister   . Colon cancer Maternal Uncle   . Diabetes Daughter        x3  . Heart disease Maternal Aunt   . Colon polyps Neg Hx   . Esophageal cancer Neg Hx   . Gallbladder disease Neg Hx   . Rectal cancer Neg Hx   . Stomach cancer Neg Hx     Review of Systems  Constitutional: Negative for chills and fever.  HENT: Negative for trouble swallowing.   Respiratory: Positive for cough (intermittently with allergies, humidity), shortness of breath (with moderate exertion - no change) and wheezing (intermittently with allergies, humidity).   Cardiovascular: Positive for palpitations (chronic, no change) and leg swelling (takes lasix about 2/week). Negative for chest pain.  Gastrointestinal: Negative for abdominal pain and nausea.       Gerd controlled  Neurological: Negative for light-headedness and headaches.       Objective:   Vitals:   08/20/20 0825  BP: 126/72  Pulse: (!) 56  Temp: 98.2 F (36.8 C)  SpO2: 95%   BP Readings from Last 3 Encounters:  08/20/20 126/72  02/19/20 120/82  10/18/19 (!) 152/83   Wt Readings from Last 3 Encounters:  08/20/20 241 lb (109.3 kg)  02/19/20 247 lb (112 kg)  09/10/19 247 lb (112 kg)   Body mass index is 40.1 kg/m.   Physical Exam    Constitutional: Appears well-developed and well-nourished. No distress.  HENT:  Head: Normocephalic and atraumatic.  Neck: Neck supple. No tracheal deviation present. No thyromegaly present.  No cervical lymphadenopathy Cardiovascular: Normal rate, regular rhythm and normal heart sounds.   No murmur heard. No carotid bruit .  No edema Pulmonary/Chest: Effort normal and breath sounds normal. No respiratory distress. No has no wheezes. No rales.  Skin: Skin is warm and dry. Not diaphoretic.  Psychiatric: Normal mood and affect. Behavior is normal.       Assessment & Plan:    See Problem List for Assessment and Plan of chronic medical problems.  This visit occurred during the SARS-CoV-2 public health emergency.  Safety protocols were in place, including screening questions prior to the visit, additional usage of staff PPE, and extensive cleaning of exam room while observing appropriate contact time as indicated for disinfecting solutions.

## 2020-08-19 NOTE — Patient Instructions (Addendum)
    Blood work was ordered.      Medications changes include :   none     Please followup in 6 months  

## 2020-08-19 NOTE — Patient Instructions (Signed)
Visit Information  Phone number for Pharmacist: [phone]  Thank you for meeting with me to discuss your medications! I look forward to working with you to achieve your health care goals. Below is a summary of what we talked about during the visit:  Goals Addressed            This Visit's Progress   . Manage My Medicine       Timeframe:  Long-Range Goal Priority:  Medium Start Date:  08/19/20                           Expected End Date: 02/18/21                      Follow Up Date Sept 2022   - call for medicine refill 2 or 3 days before it runs out - call if I am sick and can't take my medicine - keep a list of all the medicines I take; vitamins and herbals too - use a pillbox to sort medicine  -collaborate with provider on medication access solutions (inhalers) -Ask about Shingrix and TDAP vaccines at local pharmacy   Why is this important?   . These steps will help you keep on track with your medicines.   Notes:        Ms. Markie was given information about Chronic Care Management services today including:  1. CCM service includes personalized support from designated clinical staff supervised by her physician, including individualized plan of care and coordination with other care providers 2. 24/7 contact phone numbers for assistance for urgent and routine care needs. 3. Standard insurance, coinsurance, copays and deductibles apply for chronic care management only during months in which we provide at least 20 minutes of these services. Most insurances cover these services at 100%, however patients may be responsible for any copay, coinsurance and/or deductible if applicable. This service may help you avoid the need for more expensive face-to-face services. 4. Only one practitioner may furnish and bill the service in a calendar month. 5. The patient may stop CCM services at any time (effective at the end of the month) by phone call to the office staff.  Patient agreed to  services and verbal consent obtained.   Patient verbalizes understanding of instructions provided today and agrees to view in [place].  Telephone follow up appointment with pharmacy team member scheduled for: 8 months  Charlene Brooke, PharmD, [place], CPP Clinical Pharmacist [facility] [phone]

## 2020-08-19 NOTE — Progress Notes (Signed)
Chronic Care Management Pharmacy Note  08/19/2020 Name:  April Combs MRN:  [id number] DOB:  August 14, 1945  Summary: -Pt is non-compliant with low-salt, low-cholesterol, low-carb diet and does not exercise -Pt is taking Trelegy 1-2 times weekly due to cost concerns, although she states breathing is controlled this way. Based on her reported drug costs she will not qualify for [employer] patient assistance, but may qualify for other company's inhalers  Recommendations/Changes made from today's visit: -Counseled extensively on lifestyle modification to improve chronic conditions -Can consider switching Trelegy to [facility] to get free inhaler through AZ&Me - pt will let us know if she wants to try this -Advised Shingrix and TDAP vaccines at local pharmacy   Subjective: April Combs is an 75 y.o. year old female who is a primary patient of Burns, Claudina Lick, MD.  The CCM team was consulted for assistance with disease management and care coordination needs.    Engaged with patient by telephone for initial visit in response to provider referral for pharmacy case management and/or care coordination services.   Consent to Services:  The patient was given the following information about Chronic Care Management services today, agreed to services, and gave verbal consent: 1. CCM service includes personalized support from designated clinical staff supervised by the primary care provider, including individualized plan of care and coordination with other care providers 2. 24/7 contact phone numbers for assistance for urgent and routine care needs. 3. Service will only be billed when office clinical staff spend 20 minutes or more in a month to coordinate care. 4. Only one practitioner may furnish and bill the service in a calendar month. 5.The patient may stop CCM services at any time (effective at the end of the month) by phone call to the office staff. 6. The patient will be responsible for cost sharing (co-pay)  of up to 20% of the service fee (after annual deductible is met). Patient agreed to services and consent obtained.  Patient Care Team: Binnie Rail, MD as PCP - General (Internal Medicine) Leonie Man, MD as PCP - Cardiology (Cardiology) Leonie Man, MD as Consulting Physician (Cardiology) Lafayette Dragon, MD (Inactive) as Consulting Physician (Gastroenterology) Brand Males, MD as Consulting Physician (Pulmonary Disease) Charlton Haws, [facility] as Pharmacist (Pharmacist)  Recent office visits: 02/19/20 Burns (PCP) - F/u Hyperlipemia. F/u 6 mos. Increase Omeprazole to 20 mg 2x day.  Recent consult visits: 09/10/19 Dr Ellyn Hack (cardiology): f/u HFpEF, hx palpitations. Dyspnea likely pulmonary/deconditioning. No med changes  Hospital visits: None in previous 6 months   Objective:  Lab Results  Component Value Date   CREATININE 0.85 02/19/2020   BUN 19 02/19/2020   GFR 67.92 02/19/2020   GFRNONAA 93 07/04/2017   GFRAA 107 07/04/2017   NA 139 02/19/2020   K 4.5 02/19/2020   CALCIUM 9.2 02/19/2020   CO2 26 02/19/2020   GLUCOSE 103 (H) 02/19/2020    Lab Results  Component Value Date/Time   HGBA1C 6.2 02/19/2020 11:10 AM   HGBA1C 6.1 08/20/2019 09:49 AM   GFR 67.92 02/19/2020 11:10 AM   GFR 71.34 08/20/2019 09:49 AM    Last diabetic Eye exam: No results found for: HMDIABEYEEXA  Last diabetic Foot exam: No results found for: HMDIABFOOTEX   Lab Results  Component Value Date   CHOL 171 02/19/2020   HDL 54.30 02/19/2020   LDLCALC 83 02/19/2020   LDLDIRECT 77.0 08/20/2019   TRIG 168.0 (H) 02/19/2020   CHOLHDL 3 02/19/2020  Hepatic Function Latest Ref Rng & Units 02/19/2020 08/20/2019 02/19/2019  Total Protein 6.0 - 8.3 g/dL 7.2 6.7 6.9  Albumin 3.5 - 5.2 g/dL 4.4 4.2 4.3  AST 0 - 37 U/L 22 19 18   ALT 0 - 35 U/L 17 16 15   Alk Phosphatase 39 - 117 U/L 81 64 84  Total Bilirubin 0.2 - 1.2 mg/dL 0.6 0.4 0.4  Bilirubin, Direct 0.0 - 0.3 mg/dL - - -    Lab  Results  Component Value Date/Time   TSH 4.06 02/19/2020 11:10 AM   TSH 2.84 08/20/2019 09:49 AM   FREET4 0.69 02/15/2018 09:22 AM    CBC Latest Ref Rng & Units 02/19/2020 08/20/2019 02/19/2019  WBC 4.0 - 10.5 K/uL 5.8 6.0 5.2  Hemoglobin 12.0 - 15.0 g/dL 13.2 13.0 12.9  Hematocrit 36.0 - 46.0 % 39.7 38.2 39.0  Platelets 150.0 - 400.0 K/uL 174.0 170.0 181.0    No results found for: VD25OH  Clinical ASCVD: Yes  - aortic atherosclerosis The 10-year ASCVD risk score Mikey Bussing DC Jr., et al., 2013) is: 16.6%   Values used to calculate the score:     Age: 49 years     Sex: Female     Is Non-Hispanic African American: No     Diabetic: No     Tobacco smoker: No     Systolic Blood Pressure: 967 mmHg     Is BP treated: Yes     HDL Cholesterol: 54.3 mg/dL     Total Cholesterol: 171 mg/dL    Depression screen [facility] 2/9 08/20/2019 08/18/2018 12/13/2016  Decreased Interest 0 0 0  Down, Depressed, Hopeless 0 0 0  PHQ - 2 Score 0 0 0  Altered sleeping - - -  Tired, decreased energy - - -  Change in appetite - - -  Feeling bad or failure about yourself  - - -  Trouble concentrating - - -  Moving slowly or fidgety/restless - - -  Suicidal thoughts - - -  PHQ-9 Score - - -      Ref. Range 02/19/2020 11:10  Uric Acid, Serum Latest Ref Range: 2.4 - 7.0 mg/dL 4.9     Social History   Tobacco Use  Smoking Status Former Smoker  . Packs/day: 1.00  . Years: 30.00  . Pack years: 30.00  . Quit date: 01/18/2003  . Years since quitting: 17.5  Smokeless Tobacco Never Used   BP Readings from Last 3 Encounters:  02/19/20 120/82  10/18/19 (!) 152/83  09/10/19 120/76   Pulse Readings from Last 3 Encounters:  02/19/20 79  10/18/19 90  09/10/19 77   Wt Readings from Last 3 Encounters:  02/19/20 247 lb (112 kg)  09/10/19 247 lb (112 kg)  08/20/19 249 lb (112.9 kg)   BMI Readings from Last 3 Encounters:  02/19/20 41.10 kg/m  09/10/19 41.10 kg/m  08/20/19 40.19 kg/m     Assessment/Interventions: Review of patient past medical history, allergies, medications, health status, including review of consultants reports, laboratory and other test data, was performed as part of comprehensive evaluation and provision of chronic care management services.   SDOH:  (Social Determinants of Health) assessments and interventions performed: Yes SDOH Interventions   Flowsheet Row Most Recent Value  SDOH Interventions   Financial Strain Interventions Other (Comment)  [consider switching to [employer] for AZ&Me PAP]     SDOH Screenings   Alcohol Screen: Not on file  Depression (PHQ2-9): Low Risk   . PHQ-2 Score: 0  Financial  Resource Strain: Medium Risk  . Difficulty of Paying Living Expenses: Somewhat hard  Food Insecurity: Not on file  Housing: Not on file  Physical Activity: Not on file  Social Connections: Not on file  Stress: Not on file  Tobacco Use: Medium Risk  . Smoking Tobacco Use: Former Smoker  . Smokeless Tobacco Use: Never Used  Transportation Needs: Not on file    [place]  No Known Allergies  Medications Reviewed Today    Reviewed by Charlton Haws, [facility] (Pharmacist) on 08/19/20 at [place] List Status: <None>  Medication Order Taking? Sig Documenting Provider Last Dose Status Informant  albuterol (VENTOLIN HFA) 108 (90 Base) MCG/ACT inhaler [id number] Yes Inhale 2 puffs into the lungs every 6 (six) hours as needed for wheezing or shortness of breath. [provider] Taking Active   allopurinol (ZYLOPRIM) 300 MG tablet [id number] Yes Take 1 tablet (300 mg total) by mouth daily. Binnie Rail, MD Taking Active   atorvastatin (LIPITOR) 20 MG tablet [id number] Yes Take 1 tablet (20 mg total) by mouth daily. Binnie Rail, MD Taking Active   carvedilol (COREG) 12.5 MG tablet [id number] Yes TAKE 1 AND 1/2 TABLETS BY  MOUTH TWICE DAILY WITH A  MEAL Leonie Man, MD Taking Active   Fluticasone-Umeclidin-Vilant (TRELEGY ELLIPTA)  100-62.5-25 MCG/INH AEPB [id number] Yes Inhale 1 puff into the lungs daily as needed. [provider] Taking Active   furosemide (LASIX) 20 MG tablet [id number] Yes Take 1 tablet (20 mg total) by mouth daily as needed. Leonie Man, MD Taking Active   gabapentin (NEURONTIN) 100 MG capsule [id number] Yes TAKE 1 CAPSULE BY MOUTH IN  THE MORNING AND 2 CAPSULES  BY MOUTH AT BEDTIME Binnie Rail, MD Taking Active   levothyroxine (SYNTHROID) 50 MCG tablet [id number] Yes Take 1 tablet (50 mcg total) by mouth daily. Binnie Rail, MD Taking Active   lisinopril-hydrochlorothiazide (ZESTORETIC) 20-25 MG tablet [id number] Yes TAKE 1 TABLET BY MOUTH  DAILY Leonie Man, MD Taking Active   naproxen sodium (ALEVE) 220 MG tablet [id number] Yes Take 220 mg by mouth daily as needed. [provider] Taking Active   nitroGLYCERIN (NITROSTAT) 0.4 MG SL tablet [id number] Yes DISSOLVE ONE TABLET UNDER THE TONGUE EVERY 5 MINUTES AS NEEDED FOR CHEST PAIN.  DO NOT EXCEED A TOTAL OF 3 DOSES IN 15 MINUTES Leonie Man, MD Taking Active   omeprazole (PRILOSEC) 20 MG capsule [id number] Yes Take 1 capsule (20 mg total) by mouth 2 (two) times daily before a meal. Burns, Claudina Lick, MD Taking Active           Patient Active Problem List   Diagnosis Date Noted  . Atherosclerosis of aorta ([place]) 12/31/2019  . Physical deconditioning 08/18/2018  . Bilateral leg weakness 02/15/2018  . Prediabetes 08/10/2017  . 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 Gout 04/10/2017  . Hypothyroidism 02/09/2017  . Morbid obesity ([place]) 10/15/2015  . Pulmonary emphysema ([place]) 04/29/2015  . BRBPR (bright red blood per rectum) 02/05/2015  . Lumbar radiculopathy 12/24/2014  . Hx of tobacco use, presenting hazards to health 09/29/2014  . Nail fungus 03/11/2014  . FALSE POSITIVE NUCLEAR STRESS CHEST 01/28/2014  . Chest pain on exertion 01/14/2014  . GERD (gastroesophageal  reflux disease) 05/15/2012  . Positional vertigo 12/22/2011  . Irregular heart beat 09/29/2011  . Hyperlipidemia with target LDL less than 100 09/29/2011  . SOB (shortness of breath) 08/23/2011  .  Essential hypertension 05/28/2008  . OSTEOARTHRITIS 05/28/2008    Immunization History  Administered Date(s) Administered  . Fluad Quad(high Dose 65+) 11/09/2018  . Influenza, High Dose Seasonal PF 01/28/2015, 12/24/2015, 12/13/2016, 11/16/2017, 12/28/2019  . Influenza,inj,Quad PF,6+ Mos 12/24/2015  . Janssen (J&J) SARS-COV-2 Vaccination 06/01/2019, 12/28/2019  . Pneumococcal Conjugate-13 09/19/2013  . Pneumococcal Polysaccharide-23 09/25/2014  . Tdap 02/29/2008    Conditions to be addressed/monitored:  Hypertension, Hyperlipidemia, Heart Failure, Coronary Artery Disease, GERD, COPD, Hypothyroidism, Osteoarthritis and Gout  Care Plan : CCM Pharmacy Care Plan  Updates made by Charlton Haws, [place] since 08/19/2020 12:00 AM    Problem: Hypertension, Hyperlipidemia, Heart Failure, Coronary Artery Disease, GERD, COPD, Hypothyroidism, Osteoarthritis and Gout   Priority: High    Long-Range Goal: Disease management   Start Date: 08/19/2020  Expected End Date: 08/19/2021  This Visit's Progress: On track  Priority: High  Note:   Current Barriers:  . Unable to independently afford treatment regimen . Unable to independently monitor therapeutic efficacy  Pharmacist Clinical Goal(s):  [name] Patient will verbalize ability to afford treatment regimen . achieve adherence to monitoring guidelines and medication adherence to achieve therapeutic efficacy through collaboration with PharmD and provider.   Interventions: . 1:1 collaboration with Binnie Rail, MD regarding development and update of comprehensive plan of care as evidenced by provider attestation and co-signature . Inter-disciplinary care team collaboration (see longitudinal plan of care) . Comprehensive medication review performed;  medication list updated in electronic medical record  Hypertension / diastolic HF (BP goal <027/25) -Controlled - BP in office is at goal; pt does not monitor routinely at home; she reports swelling/shortness of breath on a weekly basis and takes furosemide for this; she is non-compliant with low salt diet -Last ejection fraction: 45% (04/2017) -Current treatment: . Carvedilol 12.5 mg - 1.5 tab BID . Furosemide 20 mg daily PRN - couple days a week . Lisinopril-HCTZ 20-25 mg daily AM -Medications previously tried:   -Current home readings: only checks when she has a headache -Current dietary habits: no limitations -Current exercise habits: none -Denies hypotensive/hypertensive symptoms -Educated on BP goals and benefits of medications for prevention of heart attack, stroke and kidney damage; Daily salt intake goal < 2300 mg; Exercise goal of 150 minutes per week; -Counseled to monitor BP at home as needed, document, and provide log at future appointments -Recommended to continue current medication  Hyperlipidemia: (LDL goal < 70) -Not ideally controlled - most recent LDL was slightly above goal of < 70; pt is non-compliant with low-cholesterol diet  -aortic atherosclerosis on CT (12/2019) -Current treatment: . Atorvastatin 20 mg daily . Nitroglycerin 0.4 mg SL prn - has used twice -Educated on Cholesterol goals; Benefits of statin for ASCVD risk reduction; Importance of limiting foods high in cholesterol; -Recommended to continue current medication  COPD (Goal: control symptoms and prevent exacerbations) -Controlled - pt takes Trelegy PRN (usually a few times a week) and this controls her symptoms "well enough"; she does not take it daily to save on copays - she reports she can get 5 inhalers before she gets to the donut hole; she uses albuterol inhaler nightly -Gold Grade: Gold 2 (FEV1 50-79%) -Current COPD Classification:  B (high sx, <2 exacerbations/yr) -MMRC/CAT score: not on  file -Pulmonary function testing: 06/02/2015- FEV1 56% predicted, FEV1/FVC 0.66 -Exacerbations requiring treatment in last 6 months: none -Current treatment  . Trelegy 100-62.5-25 mcg/act PRN . Albuterol HFA at night -Patient denies consistent use of maintenance inhaler -Frequency of rescue inhaler  use: daily -Counseled on Benefits of consistent maintenance inhaler use -Assessed patient finances. She will not qualify for [employer] patient assistance because she will not reach OOP minimum ($600); she may qualify for other PAP programs ([employer] - AZ&Me does not have an OOP minimum requirement); pt reports she is doing ok for now but if symptoms or worsen or she requires inhalers more often she will contact CCM pharmacist about switching inhalers / PAP -Recommend to continue current medication  Prediabetes (A1c goal <6.5%) -Diet-controlled - A1c has been stable for past several years -Counseled on diet and exercise extensively  Gout (Goal: uric acid < 6, prevent flares) -Controlled - pt has gout-like symptoms every few months but symptoms typically resolve within a day with naproxen; most recent uric acid was at goal; pt acknowledges red meat can lead to gout flares -Current treatment  . Allopurinol 300 mg daily . Naproxen 220 mg PRN -Counseled on low purine diet - limiting high-fat foods, yeast, beer/alcohol, high fructose corn syrup -Recommend to continue current medication  Pain (Goal: manage symptoms) -lumbar radiculopathy, osteoarthritis -Controlled - Patient is satisfied with current regimen and denies issues -Current treatment  . Gabapentin 100 mg - 1 AM, 2 PM -Recommended to continue current medication  Hypothyroidism (Goal: maintain TSH in goal range) -Controlled - TSH has been at goal on current dose; pt does not separate levothyroxine from other medications -Current treatment  . Levothyroxine 50 mcg daily  -Recommended to continue current medication  GERD (Goal: manage  symptoms) -Controlled - pt denies issues since starting PPI; she reports she needs BID dosing due to symptoms worsening at night -Current treatment  . Omeprazole 20 mg BID -Recommended to continue current medication  Health Maintenance -Vaccine gaps: Shingrix, TDAP, covid booster (J&J) -pt reports she got COVID vaccine booster (J&J) on 12/28/19 at [place] and Shingrix vaccines at local pharmacy  Patient Goals/Self-Care Activities . Patient will:  - take medications as prescribed focus on medication adherence by pill box collaborate with provider on medication access solutions (inhalers) target a minimum of 150 minutes of moderate intensity exercise weekly engage in dietary modifications by reducing salt, increasing vegetables/lean meats Ask about Shingrix and TDAP vaccines at local pharmacy      Medication Assistance: None required.  Patient affirms current coverage meets needs.  Compliance/Adherence/Medication fill history: Care Gaps: Shingrix DEXA scan (due 03/15/2017) TDAP (due 02/28/2018) Covid booster (J&J - due 07/27/19)  Star-Rating Drugs: Atorvastatin - LF 06/16/20 x 90 ds Lisinopril-HCTZ - LF 06/12/20 x 90 ds  Patient's preferred pharmacy is:  [facility]  ([place], [address], [address] [place], [place] [address] Phone: [phone] Fax: [place] ([address]), [place] [address] [place] ([place]) [place] [address] Phone: [phone] Fax: [phone]  Uses pill box? Yes Pt endorses 100% compliance  We discussed: Current pharmacy is preferred with insurance plan and patient is satisfied with pharmacy services Patient decided to: Continue current medication management strategy  Care Plan and Follow Up Patient Decision:  Patient agrees to Care Plan and Follow-up.  Plan: Telephone follow up appointment with care  management team member scheduled for:  8 months  Charlene Brooke, PharmD, [place], CPP Clinical Pharmacist [facility] [phone]

## 2020-08-20 ENCOUNTER — Encounter: Payer: Self-pay | Admitting: Internal Medicine

## 2020-08-20 ENCOUNTER — Ambulatory Visit (INDEPENDENT_AMBULATORY_CARE_PROVIDER_SITE_OTHER): Payer: Medicare Other | Admitting: Internal Medicine

## 2020-08-20 VITALS — BP 126/72 | HR 56 | Temp 98.2°F | Ht 65.0 in | Wt 241.0 lb

## 2020-08-20 DIAGNOSIS — I10 Essential (primary) hypertension: Secondary | ICD-10-CM

## 2020-08-20 DIAGNOSIS — E039 Hypothyroidism, unspecified: Secondary | ICD-10-CM | POA: Diagnosis not present

## 2020-08-20 DIAGNOSIS — M5416 Radiculopathy, lumbar region: Secondary | ICD-10-CM | POA: Diagnosis not present

## 2020-08-20 DIAGNOSIS — E785 Hyperlipidemia, unspecified: Secondary | ICD-10-CM

## 2020-08-20 DIAGNOSIS — K219 Gastro-esophageal reflux disease without esophagitis: Secondary | ICD-10-CM | POA: Diagnosis not present

## 2020-08-20 DIAGNOSIS — M109 Gout, unspecified: Secondary | ICD-10-CM | POA: Diagnosis not present

## 2020-08-20 DIAGNOSIS — R7303 Prediabetes: Secondary | ICD-10-CM

## 2020-08-20 DIAGNOSIS — J439 Emphysema, unspecified: Secondary | ICD-10-CM

## 2020-08-20 LAB — COMPREHENSIVE METABOLIC PANEL
ALT: 15 U/L (ref 0–35)
AST: 19 U/L (ref 0–37)
Albumin: 4.4 g/dL (ref 3.5–5.2)
Alkaline Phosphatase: 82 U/L (ref 39–117)
BUN: 30 mg/dL — ABNORMAL HIGH (ref 6–23)
CO2: 26 mEq/L (ref 19–32)
Calcium: 9.4 mg/dL (ref 8.4–10.5)
Chloride: 103 mEq/L (ref 96–112)
Creatinine, Ser: 1.03 mg/dL (ref 0.40–1.20)
GFR: 53.75 mL/min — ABNORMAL LOW (ref >60.00)
Glucose, Bld: 113 mg/dL — ABNORMAL HIGH (ref 70–99)
Potassium: 4.1 mEq/L (ref 3.5–5.1)
Sodium: 140 mEq/L (ref 135–145)
Total Bilirubin: 0.6 mg/dL (ref 0.2–1.2)
Total Protein: 7.3 g/dL (ref 6.0–8.3)

## 2020-08-20 LAB — LIPID PANEL
Cholesterol: 154 mg/dL (ref 0–200)
HDL: 49.4 mg/dL (ref >39.00)
NonHDL: 104.3
Total CHOL/HDL Ratio: 3
Triglycerides: 230 mg/dL — ABNORMAL HIGH (ref 0.0–149.0)
VLDL: 46 mg/dL — ABNORMAL HIGH (ref 0.0–40.0)

## 2020-08-20 LAB — LDL CHOLESTEROL, DIRECT: Direct LDL: 74 mg/dL

## 2020-08-20 LAB — TSH: TSH: 3.57 u[IU]/mL (ref 0.35–4.50)

## 2020-08-20 LAB — HEMOGLOBIN A1C: Hgb A1c MFr Bld: 6.2 % (ref 4.6–6.5)

## 2020-08-20 NOTE — Assessment & Plan Note (Signed)
Chronic Has shortness of breath with activity Has wheezing and coughing intermittently depending on allergies or humidity Continue Trelegy daily as needed-it is expensive and she only uses it as needed.  It does help. Continue albuterol inhaler as needed

## 2020-08-20 NOTE — Assessment & Plan Note (Signed)
Chronic  Clinically euthyroid Currently taking levothyroxine 50 mcg daily Check tsh  Titrate med dose if needed  

## 2020-08-20 NOTE — Assessment & Plan Note (Signed)
Chronic GERD controlled Continue omeprazole 20 mg twice daily AC

## 2020-08-20 NOTE — Assessment & Plan Note (Signed)
>>  ASSESSMENT AND PLAN FOR PULMONARY EMPHYSEMA ([facility]) WRITTEN ON 08/20/2020 12:27 PM BY BURNS, GLADE PARAS, MD  Chronic Has shortness of breath with activity Has wheezing and coughing intermittently depending on allergies or humidity Continue Trelegy daily as needed-it is expensive and she only uses it as needed.  It does help. Continue albuterol  inhaler as needed

## 2020-08-20 NOTE — Assessment & Plan Note (Signed)
Chronic Check lipid panel Continue atorvastatin 20 mg daily Heart healthy diet

## 2020-08-20 NOTE — Assessment & Plan Note (Signed)
Chronic No gout flares since her last visit Continue allopurinol 300 mg daily

## 2020-08-20 NOTE — Assessment & Plan Note (Signed)
Chronic BP well controlled Continue carvedilol 18.75 mg twice daily, Lasix 20 mg daily as needed, lisinopril hydrochlorothiazide 20-25 milligrams daily cmp

## 2020-08-20 NOTE — Assessment & Plan Note (Signed)
Chronic Check a1c Low sugar / carb diet Encouraged regular exercise   

## 2020-08-20 NOTE — Assessment & Plan Note (Signed)
Chronic Unable to stand or walk for long periods of time Continue gabapentin 100 mg in the morning and 200 mg at bedtime

## 2020-09-28 NOTE — Progress Notes (Signed)
Primary Care Provider: Binnie Rail, MD Cardiologist: Glenetta Hew, MD Electrophysiologist: None  Clinic Note: Chief Complaint  Patient presents with   Follow-up    Doing pretty well.  No major complaints    ===================================  ASSESSMENT/PLAN   Problem List Items Addressed This Visit     Morbid obesity ([place]) (Chronic)    Discussed continue diet and exercise.  She has lost some weight.  His efforts.  Hopefully she can slowly lose more.       Essential hypertension (Chronic)    Normal blood pressure.  Continue current meds.  Carvedilol 18.75 mg twice daily along with lisinopril and HCTZ 20-25 mg daily with current Lasix.  Stable chemistry panel       Relevant Medications   furosemide (LASIX) 20 MG tablet   Irregular heart beat (Chronic)    Well-controlled on carvedilol.  No further complaints.       Relevant Orders   EKG 12-Lead (Completed)   Hyperlipidemia with target LDL less than 100 (Chronic)    Lipids look pretty good on current dose of atorvastatin.  Continue heart healthy diet.  She is losing weight.  Labs just checked by PCP.  Triglycerides somewhat elevated which may indicate elevated blood sugar levels.       Relevant Medications   furosemide (LASIX) 20 MG tablet   Hypertensive 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 (HCC) - Primary (Chronic)    Stable blood pressure today on current dose of carvedilol and lisinopril HCTZ.  Only using intermittent Lasix.  Baseline dyspnea, seems to be doing well with Trelegy.  Overall class I-II symptoms.  No real orthopnea or PND.       Relevant Medications   furosemide (LASIX) 20 MG tablet   Other Relevant Orders   EKG 12-Lead (Completed)    ===================================  HPI:    April Combs is a 75 y.o. female with a PMH notable for Hypertensive Heart Disease with Chronic HFpEF complicated by Obesity and Chronic Lung Disease (Emphysema) who presents  today for annual follow-up.  April Combs was last seen on September 10, 2019 just after the anniversary of her husband April Combs passing.  She commented mostly on having to learn how to do all the chores at [place] to taking care of including money management.  Noted improved breathing overall.  Breathing is better when she does take Trelegy-even when she does it intermittently.  Noted occasional palpitations off and on usually when she gets upset.  No PND, orthopnea with minimal edema which she would take PRN Lasix.  Recent Hospitalizations: None  Reviewed  CV studies:    The following studies were reviewed today: (if available, images/films reviewed: From Epic Chart or Care Everywhere) None:   Interval History:   April Combs returns here today doing pretty well.  She has not had any chest pain or pressure since December when she had a light spell.  She takes her Lasix about 2-3 times a week on occasion.  What she usually will do was taken to 3 days in a row every 2 to 3 weeks.  Does not really notice any orthopnea, just has some edema.  She is all that active but does go out and do things in the yard and tries to interact with her grandkids.  About the only thing that she is not been out of do of her chores is pumping gas or cleaning the pool.  She usually get someone to drive her  to the gas station to get gas in the car that she recently purchased.  Very happy to announce that.  She says that she does not have enough strength to do the pool cleaning.  However whenever chores that she is doing other than that, she seems to doing okay, just has to stop every down to catch her breath.  Even if she misses a few doses here and there of the Trelegy, since she is on it, she feels much better.  She wakes up few times at night maybe 2-3 times to urinate.  No hematuria or dysuria.  CV Review of Symptoms (Summary) Cardiovascular ROS: positive for - dyspnea on exertion, edema, shortness of breath, and  this all seems to be pretty well controlled and stable. negative for - chest pain, irregular heartbeat, orthopnea, palpitations, paroxysmal nocturnal dyspnea, rapid heart rate, or syncope/near syncope or TIA/amaurosis fugax, claudication  REVIEWED OF SYSTEMS   Review of Systems  Constitutional:  Positive for weight loss (Trying to watch what she eats.  Appetite is not as much as it once was.). Negative for malaise/fatigue (Energy level is overall better, but she has bad days as well.).  Respiratory:  Positive for cough, shortness of breath (At baseline) and wheezing.        At baseline  Cardiovascular:  Positive for leg swelling (Per HPI).  Gastrointestinal:  Negative for blood in stool and melena.  Genitourinary:  Negative for hematuria.  Musculoskeletal:  Positive for back pain. Negative for joint pain.  Neurological:  Positive for dizziness (She still has somewhat poor balance and unsteady gait.). Negative for focal weakness.  Psychiatric/Behavioral: Negative.      I have reviewed and (if needed) personally updated the patient's problem list, medications, allergies, past medical and surgical history, social and family history.   PAST MEDICAL HISTORY   Past Medical History:  Diagnosis Date   Anemia    Anxiety    BRBPR (bright red blood per rectum) 02/05/2015   Coronary artery disease (CAD) excluded 01/2014   Low Risk Myoview (false positive) with possible inferior-inferolateral ischemia, but with nonsustained VT --> CATH --> Angiographically Normal Coronary Arteries; coronary CT angiogram showed mild approximately disease but otherwise no obstructive disease.  Coronary calcium score 40.   Depression    FALSE POSITIVE NUCLEAR STRESS CHEST 01/28/2014   Fungal dermatitis 09/29/2011   GERD (gastroesophageal reflux disease)    Gout 04/10/2017   First episode - Left foot 03/2017   Hyperlipidemia    Hyperlipidemia with target LDL less than 100 09/29/2011   Hyperplastic colon polyp     Hypertension    Hypertensive 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 ([place]) 04/26/2017   HYPERTRIGLYCERIDEMIA 07/12/2008   Qualifier: Diagnosis of  By: Birdie Riddle MD, Katherine     Hypothyroidism 02/09/2017   Irregular heart beat 09/29/2011   Lumbar radiculopathy 12/24/2014   Morbid obesity ([place]) 10/15/2015   NICM (nonischemic cardiomyopathy) ([place])    Essentially resolved. Echocardiogram July 2013 showed normal EF greater than 55%. Important septal motion. Normal LV filling pressures. Mild MR and mild anterior MVP   Obesity (BMI 30-39.9)    Osteoarthritis    Positional vertigo    Prediabetes 08/10/2017   Pulmonary emphysema ([place]) 04/29/2015   PFTs 2017 - moderate-severe obstructive disease, some restrictive disease   SOB (shortness of breath) 08/23/2011    PAST SURGICAL HISTORY   Past Surgical History:  Procedure Laterality Date   ABDOMINAL HYSTERECTOMY     APPENDECTOMY  as child  BREAST BIOPSY Right    benign   CHOLECYSTECTOMY     COLONOSCOPY     CORONARY CALCIUM SCORE/CT ANGIOGRAM  07/2017   Calcium score 40.  Mild LAD stenosis, but no other significant disease.   DOPPLER ECHOCARDIOGRAPHY  July 2013   09/20/11. normal Lv thickness and function with EF greater thatn 55%   Exercise tolerance test - CPET-MET-TEST  July 2013   Submaximal effort. Only 80% of her, therefore peak VO2 of 75% is likely an underestimate. High resting heart rate, achieving 86% of heart rate. --> Unable to interpret due to lack of effort.   GASTRIC BYPASS  1980   GASTRIC BYPASS     KIDNEY STONE SURGERY     LEFT HEART CATHETERIZATION WITH CORONARY ANGIOGRAM N/A 02/01/2014   Procedure: LEFT HEART CATHETERIZATION WITH CORONARY ANGIOGRAM;  Surgeon: Leonie Man, MD;  Location: [facility] CATH LAB;  Service: Cardiovascular: Angiographically normal coronaries   NM MYOVIEW LTD  November 2015   4:20 min, 4.6 METS --> DOE, but no chest discomfort; Significant + ST -T wave changes noted with NSVT & PVCs.  Images suggest Inferior-inferolateral ischemia.  Low Risk. -- FALSE POSITIVE   Pulmonary Function Tests  July 2013   Increased densities, decreased FVC - consistent with obesity hypoventilation; FEV1 is 58%, FVC 60% of predicted.   TRANSTHORACIC ECHOCARDIOGRAM  04/2017   Mildly reduced EF of 45%.  No regional wall motion normality.  GR 1 DD   UPPER GASTROINTESTINAL ENDOSCOPY      Immunization History  Administered Date(s) Administered   Fluad Quad(high Dose 65+) 11/09/2018   Influenza, High Dose Seasonal PF 01/28/2015, 12/24/2015, 12/13/2016, 11/16/2017, 12/28/2019   Influenza,inj,Quad PF,6+ Mos 12/24/2015   Janssen (J&J) SARS-COV-2 Vaccination 06/01/2019, 12/28/2019   Pneumococcal Conjugate-13 09/19/2013   Pneumococcal Polysaccharide-23 09/25/2014   Tdap 02/29/2008    MEDICATIONS/ALLERGIES   Current Meds  Medication Sig   albuterol (VENTOLIN HFA) 108 (90 Base) MCG/ACT inhaler Inhale 2 puffs into the lungs every 6 (six) hours as needed for wheezing or shortness of breath.   allopurinol (ZYLOPRIM) 300 MG tablet Take 1 tablet (300 mg total) by mouth daily.   atorvastatin (LIPITOR) 20 MG tablet Take 1 tablet (20 mg total) by mouth daily.   carvedilol (COREG) 12.5 MG tablet TAKE 1 AND 1/2 TABLETS BY  MOUTH TWICE DAILY WITH A  MEAL   Fluticasone-Umeclidin-Vilant (TRELEGY ELLIPTA) 100-62.5-25 MCG/INH AEPB Inhale 1 puff into the lungs daily as needed.   gabapentin (NEURONTIN) 100 MG capsule TAKE 1 CAPSULE BY MOUTH IN  THE MORNING AND 2 CAPSULES  BY MOUTH AT BEDTIME   lisinopril-hydrochlorothiazide (ZESTORETIC) 20-25 MG tablet TAKE 1 TABLET BY MOUTH  DAILY   naproxen sodium (ALEVE) 220 MG tablet Take 220 mg by mouth daily as needed.   omeprazole (PRILOSEC) 20 MG capsule Take 1 capsule (20 mg total) by mouth 2 (two) times daily before a meal.   [DISCONTINUED] furosemide (LASIX) 20 MG tablet Take 1 tablet (20 mg total) by mouth daily as needed.   [DISCONTINUED] levothyroxine (SYNTHROID) 50 MCG  tablet Take 1 tablet (50 mcg total) by mouth daily.    No Known Allergies  SOCIAL HISTORY/FAMILY HISTORY   Reviewed in Epic:  Pertinent findings:  Social History   Tobacco Use   Smoking status: Former    Packs/day: 1.00    Years: 30.00    Pack years: 30.00    Types: Cigarettes    Quit date: 01/18/2003    Years since quitting: 18.7  Smokeless tobacco: Never  Substance Use Topics   Alcohol use: Yes    Alcohol/week: 3.0 standard drinks    Types: 3 Cans of beer per week    Comment: drinks 2-3 beers a week   Drug use: No   Social History   Social History Narrative   She is married to [name], also patient of Dr. Ellyn Hack.   Mother of 3, grandmother 46.   Quit smoking in 2005. Social alcohol.   No routine exercise.    OBJCTIVE -PE, EKG, labs   Wt Readings from Last 3 Encounters:  09/29/20 239 lb 12.8 oz (108.8 kg)  08/20/20 241 lb (109.3 kg)  02/19/20 247 lb (112 kg)    Physical Exam: BP 118/78   Pulse 86   Resp 18   Ht _0  (1.651 m)   Wt 239 lb 12.8 oz (108.8 kg)   SpO2 97%   BMI 39.90 kg/m  Physical Exam Vitals reviewed.  Constitutional:      General: She is not in acute distress.    Appearance: Normal appearance. She is obese. She is not ill-appearing or toxic-appearing.     Comments: Well-groomed.  HENT:     Head: Normocephalic and atraumatic.  Neck:     Vascular: No carotid bruit or JVD.  Cardiovascular:     Rate and Rhythm: Normal rate and regular rhythm. Occasional Extrasystoles are present.    Chest Wall: PMI is not displaced.     Pulses: Normal pulses.     Heart sounds: S1 normal and S2 normal. Heart sounds are distant. Murmur heard.  Harsh crescendo-decrescendo early systolic murmur is present with a grade of 1/6 at the upper right sternal border radiating to the neck.    No friction rub. No gallop.  Pulmonary:     Effort: Pulmonary effort is normal. No respiratory distress.     Breath sounds: No wheezing, rhonchi or rales.      Comments: Distant breath sounds with faint expiratory wheeze. Chest:     Chest wall: No tenderness.  Musculoskeletal:        General: No swelling. Normal range of motion.     Cervical back: Normal range of motion and neck supple.  Neurological:     General: No focal deficit present.     Mental Status: She is alert and oriented to person, place, and time.     Gait: Gait abnormal.  Psychiatric:        Mood and Affect: Mood normal.        Behavior: Behavior normal.        Thought Content: Thought content normal.        Judgment: Judgment normal.     Adult ECG Report  Rate: 86 ;  Rhythm: normal sinus rhythm, premature ventricular contractions (PVC), and LVH with repolarization changes ;   Narrative Interpretation: Medically stable, PVCs more prominent  Recent Labs:  reviewed  Lab Results  Component Value Date   CHOL 154 08/20/2020   HDL 49.40 08/20/2020   LDLCALC 83 02/19/2020   LDLDIRECT 74.0 08/20/2020   TRIG 230.0 (H) 08/20/2020   CHOLHDL 3 08/20/2020   Lab Results  Component Value Date   CREATININE 1.03 08/20/2020   BUN 30 (H) 08/20/2020   NA 140 08/20/2020   K 4.1 08/20/2020   CL 103 08/20/2020   CO2 26 08/20/2020   CBC Latest Ref Rng & Units 02/19/2020 08/20/2019 02/19/2019  WBC 4.0 - 10.5 K/uL 5.8 6.0 5.2  Hemoglobin 12.0 -  15.0 g/dL 13.2 13.0 12.9  Hematocrit 36.0 - 46.0 % 39.7 38.2 39.0  Platelets 150.0 - 400.0 K/uL 174.0 170.0 181.0    Lab Results  Component Value Date   TSH 3.57 08/20/2020    ==================================================  COVID-19 Education: The signs and symptoms of COVID-19 were discussed with the patient and how to seek care for testing (follow up with PCP or arrange E-visit).    I spent a total of 16 minutes with the patient spent in direct patient consultation.  Additional time spent with chart review  / charting (studies, outside notes, etc): 15 min Total Time: 31 min  Current medicines are reviewed at length with the patient  today.  (+/- concerns) none  This visit occurred during the SARS-CoV-2 public health emergency.  Safety protocols were in place, including screening questions prior to the visit, additional usage of staff PPE, and extensive cleaning of exam room while observing appropriate contact time as indicated for disinfecting solutions.  Notice: This dictation was prepared with Dragon dictation along with smaller phrase technology. Any transcriptional errors that result from this process are unintentional and may not be corrected upon review.  Patient Instructions / Medication Changes & Studies & Tests Ordered   Patient Instructions  Medication Instructions:   No changes  *If you need a refill on your cardiac medications before your next appointment, please call your pharmacy*   Lab Work:  Not needed   Testing/Procedures:  Not needed  Follow-Up: At [facility], you and your health needs are our priority.  As part of our continuing mission to provide you with exceptional heart care, we have created designated Provider Care Teams.  These Care Teams include your primary Cardiologist (physician) and Advanced Practice Providers (APPs -  Physician Assistants and Nurse Practitioners) who all work together to provide you with the care you need, when you need it.     Your next appointment:   12 month(s)  The format for your next appointment:   In Person  Provider:   Glenetta Hew, MD   Other Instructions    Studies Ordered:   Orders Placed This Encounter  Procedures   EKG 12-Lead      Glenetta Hew, M.D., M.S. Interventional Cardiologist   Pager # [phone] Phone # [phone] [address] [place],  [address]   Thank you for choosing Heartcare at [facility]!!

## 2020-09-29 ENCOUNTER — Encounter: Payer: Self-pay | Admitting: Cardiology

## 2020-09-29 ENCOUNTER — Other Ambulatory Visit: Payer: Self-pay

## 2020-09-29 ENCOUNTER — Ambulatory Visit (INDEPENDENT_AMBULATORY_CARE_PROVIDER_SITE_OTHER): Payer: Medicare Other | Admitting: Cardiology

## 2020-09-29 VITALS — BP 118/78 | HR 86 | Resp 18 | Ht 65.0 in | Wt 239.8 lb

## 2020-09-29 DIAGNOSIS — I110 Hypertensive heart disease with heart failure: Secondary | ICD-10-CM | POA: Diagnosis not present

## 2020-09-29 DIAGNOSIS — I499 Cardiac arrhythmia, unspecified: Secondary | ICD-10-CM | POA: Diagnosis not present

## 2020-09-29 DIAGNOSIS — E785 Hyperlipidemia, unspecified: Secondary | ICD-10-CM

## 2020-09-29 DIAGNOSIS — I10 Essential (primary) hypertension: Secondary | ICD-10-CM

## 2020-09-29 DIAGNOSIS — I5032 Chronic diastolic (congestive) heart failure: Secondary | ICD-10-CM

## 2020-09-29 MED ORDER — FUROSEMIDE 20 MG PO TABS: 20.0000 mg | ORAL_TABLET | Freq: Every day | ORAL | 3 refills | Status: DC | PRN

## 2020-09-29 NOTE — Patient Instructions (Signed)

## 2020-10-05 ENCOUNTER — Other Ambulatory Visit: Payer: Self-pay | Admitting: Internal Medicine

## 2020-10-07 ENCOUNTER — Encounter: Payer: Self-pay | Admitting: Cardiology

## 2020-10-07 NOTE — Assessment & Plan Note (Signed)
Discussed continue diet and exercise.  She has lost some weight.  His efforts.  Hopefully she can slowly lose more.

## 2020-10-07 NOTE — Assessment & Plan Note (Signed)
Stable blood pressure today on current dose of carvedilol and lisinopril HCTZ.  Only using intermittent Lasix.  Baseline dyspnea, seems to be doing well with Trelegy.  Overall class I-II symptoms.  No real orthopnea or PND.

## 2020-10-07 NOTE — Assessment & Plan Note (Signed)
Lipids look pretty good on current dose of atorvastatin.  Continue heart healthy diet.  She is losing weight.  Labs just checked by PCP.  Triglycerides somewhat elevated which may indicate elevated blood sugar levels.

## 2020-10-07 NOTE — Assessment & Plan Note (Signed)
Well-controlled on carvedilol.  No further complaints.

## 2020-10-07 NOTE — Assessment & Plan Note (Addendum)
Normal blood pressure.  Continue current meds.  Carvedilol 18.75 mg twice daily along with lisinopril and HCTZ 20-25 mg daily with current Lasix.  Stable chemistry panel

## 2020-10-21 ENCOUNTER — Encounter: Payer: Self-pay | Admitting: Internal Medicine

## 2020-10-22 ENCOUNTER — Other Ambulatory Visit: Payer: Self-pay | Admitting: Cardiology

## 2020-10-22 DIAGNOSIS — I10 Essential (primary) hypertension: Secondary | ICD-10-CM

## 2020-10-22 MED ORDER — MOLNUPIRAVIR 200 MG PO CAPS: 800.0000 mg | ORAL_CAPSULE | Freq: Two times a day (BID) | ORAL | 0 refills | Status: AC

## 2020-10-27 ENCOUNTER — Other Ambulatory Visit: Payer: Self-pay

## 2020-10-27 ENCOUNTER — Inpatient Hospital Stay (HOSPITAL_COMMUNITY)
Admission: EM | Admit: 2020-10-27 | Discharge: 2020-11-01 | DRG: 177 | Disposition: A | Discharge status: Home / Self Care | Payer: Medicare Other | Source: Ambulatory Visit | Attending: Internal Medicine | Admitting: Internal Medicine

## 2020-10-27 ENCOUNTER — Emergency Department (HOSPITAL_COMMUNITY): Payer: Medicare Other

## 2020-10-27 ENCOUNTER — Ambulatory Visit
Admission: EM | Admit: 2020-10-27 | Discharge: 2020-10-27 | Disposition: A | Discharge status: Home / Self Care | Payer: Medicare Other

## 2020-10-27 ENCOUNTER — Encounter: Payer: Self-pay | Admitting: Emergency Medicine

## 2020-10-27 DIAGNOSIS — Z833 Family history of diabetes mellitus: Secondary | ICD-10-CM | POA: Diagnosis not present

## 2020-10-27 DIAGNOSIS — I5042 Chronic combined systolic (congestive) and diastolic (congestive) heart failure: Secondary | ICD-10-CM

## 2020-10-27 DIAGNOSIS — R7303 Prediabetes: Secondary | ICD-10-CM | POA: Diagnosis present

## 2020-10-27 DIAGNOSIS — R0603 Acute respiratory distress: Secondary | ICD-10-CM

## 2020-10-27 DIAGNOSIS — E785 Hyperlipidemia, unspecified: Secondary | ICD-10-CM | POA: Diagnosis present

## 2020-10-27 DIAGNOSIS — J9601 Acute respiratory failure with hypoxia: Secondary | ICD-10-CM | POA: Diagnosis present

## 2020-10-27 DIAGNOSIS — E669 Obesity, unspecified: Secondary | ICD-10-CM | POA: Diagnosis present

## 2020-10-27 DIAGNOSIS — R0902 Hypoxemia: Secondary | ICD-10-CM

## 2020-10-27 DIAGNOSIS — Z79890 Hormone replacement therapy (postmenopausal): Secondary | ICD-10-CM | POA: Diagnosis not present

## 2020-10-27 DIAGNOSIS — Z87891 Personal history of nicotine dependence: Secondary | ICD-10-CM | POA: Diagnosis not present

## 2020-10-27 DIAGNOSIS — R0602 Shortness of breath: Secondary | ICD-10-CM | POA: Diagnosis not present

## 2020-10-27 DIAGNOSIS — I110 Hypertensive heart disease with heart failure: Secondary | ICD-10-CM | POA: Diagnosis present

## 2020-10-27 DIAGNOSIS — Z803 Family history of malignant neoplasm of breast: Secondary | ICD-10-CM

## 2020-10-27 DIAGNOSIS — J441 Chronic obstructive pulmonary disease with (acute) exacerbation: Secondary | ICD-10-CM

## 2020-10-27 DIAGNOSIS — R531 Weakness: Secondary | ICD-10-CM | POA: Diagnosis not present

## 2020-10-27 DIAGNOSIS — Z9884 Bariatric surgery status: Secondary | ICD-10-CM | POA: Diagnosis not present

## 2020-10-27 DIAGNOSIS — E039 Hypothyroidism, unspecified: Secondary | ICD-10-CM | POA: Diagnosis present

## 2020-10-27 DIAGNOSIS — I5032 Chronic diastolic (congestive) heart failure: Secondary | ICD-10-CM

## 2020-10-27 DIAGNOSIS — Z7951 Long term (current) use of inhaled steroids: Secondary | ICD-10-CM | POA: Diagnosis not present

## 2020-10-27 DIAGNOSIS — Z8249 Family history of ischemic heart disease and other diseases of the circulatory system: Secondary | ICD-10-CM

## 2020-10-27 DIAGNOSIS — J1282 Pneumonia due to coronavirus disease 2019: Secondary | ICD-10-CM | POA: Diagnosis present

## 2020-10-27 DIAGNOSIS — M109 Gout, unspecified: Secondary | ICD-10-CM | POA: Diagnosis present

## 2020-10-27 DIAGNOSIS — Z8349 Family history of other endocrine, nutritional and metabolic diseases: Secondary | ICD-10-CM

## 2020-10-27 DIAGNOSIS — U071 COVID-19: Secondary | ICD-10-CM | POA: Diagnosis not present

## 2020-10-27 DIAGNOSIS — N179 Acute kidney failure, unspecified: Secondary | ICD-10-CM | POA: Diagnosis present

## 2020-10-27 DIAGNOSIS — K219 Gastro-esophageal reflux disease without esophagitis: Secondary | ICD-10-CM | POA: Diagnosis present

## 2020-10-27 DIAGNOSIS — Z800 Family history of malignant neoplasm of digestive organs: Secondary | ICD-10-CM

## 2020-10-27 DIAGNOSIS — Z79899 Other long term (current) drug therapy: Secondary | ICD-10-CM

## 2020-10-27 DIAGNOSIS — Z6839 Body mass index (BMI) 39.0-39.9, adult: Secondary | ICD-10-CM | POA: Diagnosis not present

## 2020-10-27 DIAGNOSIS — J439 Emphysema, unspecified: Secondary | ICD-10-CM | POA: Diagnosis present

## 2020-10-27 DIAGNOSIS — Z801 Family history of malignant neoplasm of trachea, bronchus and lung: Secondary | ICD-10-CM

## 2020-10-27 DIAGNOSIS — I10 Essential (primary) hypertension: Secondary | ICD-10-CM | POA: Diagnosis present

## 2020-10-27 DIAGNOSIS — I5033 Acute on chronic diastolic (congestive) heart failure: Secondary | ICD-10-CM | POA: Diagnosis present

## 2020-10-27 DIAGNOSIS — J449 Chronic obstructive pulmonary disease, unspecified: Secondary | ICD-10-CM

## 2020-10-27 DIAGNOSIS — Z820 Family history of epilepsy and other diseases of the nervous system: Secondary | ICD-10-CM

## 2020-10-27 DIAGNOSIS — E1159 Type 2 diabetes mellitus with other circulatory complications: Secondary | ICD-10-CM | POA: Diagnosis present

## 2020-10-27 DIAGNOSIS — R0600 Dyspnea, unspecified: Secondary | ICD-10-CM

## 2020-10-27 LAB — COMPREHENSIVE METABOLIC PANEL
ALT: 33 U/L (ref 0–44)
AST: 46 U/L — ABNORMAL HIGH (ref 15–41)
Albumin: 3.1 g/dL — ABNORMAL LOW (ref 3.5–5.0)
Alkaline Phosphatase: 81 U/L (ref 38–126)
Anion gap: 16 — ABNORMAL HIGH (ref 5–15)
BUN: 26 mg/dL — ABNORMAL HIGH (ref 8–23)
CO2: 22 mmol/L (ref 22–32)
Calcium: 8.4 mg/dL — ABNORMAL LOW (ref 8.9–10.3)
Chloride: 98 mmol/L (ref 98–111)
Creatinine, Ser: 1.19 mg/dL — ABNORMAL HIGH (ref 0.44–1.00)
GFR, Estimated: 48 mL/min — ABNORMAL LOW (ref >60)
Glucose, Bld: 133 mg/dL — ABNORMAL HIGH (ref 70–99)
Potassium: 3.5 mmol/L (ref 3.5–5.1)
Sodium: 136 mmol/L (ref 135–145)
Total Bilirubin: 1.2 mg/dL (ref 0.3–1.2)
Total Protein: 7 g/dL (ref 6.5–8.1)

## 2020-10-27 LAB — CBC WITH DIFFERENTIAL/PLATELET
Abs Immature Granulocytes: 0.2 10*3/uL — ABNORMAL HIGH (ref 0.00–0.07)
Basophils Absolute: 0.1 10*3/uL (ref 0.0–0.1)
Basophils Relative: 1 %
Eosinophils Absolute: 0 10*3/uL (ref 0.0–0.5)
Eosinophils Relative: 0 %
HCT: 35 % — ABNORMAL LOW (ref 36.0–46.0)
Hemoglobin: 11.4 g/dL — ABNORMAL LOW (ref 12.0–15.0)
Immature Granulocytes: 2 %
Lymphocytes Relative: 10 %
Lymphs Abs: 1 10*3/uL (ref 0.7–4.0)
MCH: 31.9 pg (ref 26.0–34.0)
MCHC: 32.6 g/dL (ref 30.0–36.0)
MCV: 98 fL (ref 80.0–100.0)
Monocytes Absolute: 1 10*3/uL (ref 0.1–1.0)
Monocytes Relative: 10 %
Neutro Abs: 7.5 10*3/uL (ref 1.7–7.7)
Neutrophils Relative %: 77 %
Platelets: 237 10*3/uL (ref 150–400)
RBC: 3.57 MIL/uL — ABNORMAL LOW (ref 3.87–5.11)
RDW: 14.5 % (ref 11.5–15.5)
WBC: 9.8 10*3/uL (ref 4.0–10.5)
nRBC: 0 % (ref 0.0–0.2)

## 2020-10-27 MED ORDER — SODIUM CHLORIDE 0.9 % IV SOLN
200.0000 mg | Freq: Once | INTRAVENOUS | Status: DC
  Filled 2020-10-27: qty 40

## 2020-10-27 MED ORDER — SODIUM CHLORIDE 0.9 % IV SOLN: 100.0000 mg | Freq: Every day | INTRAVENOUS | Status: DC

## 2020-10-27 MED ORDER — DEXAMETHASONE SODIUM PHOSPHATE 10 MG/ML IJ SOLN
10.0000 mg | Freq: Once | INTRAMUSCULAR | Status: AC
  Administered 2020-10-28: 10 mg via INTRAVENOUS
  Filled 2020-10-27: qty 1

## 2020-10-27 NOTE — ED Triage Notes (Signed)
Pt dx with covid last Tuesday but continues with weakness and shob. Finished anti-viral yesterday. Hx of COPD. Seen at [facility] for same today and sent here for further workup.

## 2020-10-27 NOTE — ED Provider Notes (Signed)
[place]   MRN: [id number] DOB: June 19, 1945  Subjective:   April Combs is a 75 y.o. female presenting for 1 week history of persistent and worsening cough, shortness of breath, difficulty with her breathing.  Tested positive for COVID-19 last Tuesday.  She did undergo treatment with molnupiravir.  Has a history of COPD, pulmonary emphysema, morbid obesity, hypertensive 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.  No current facility-administered medications for this encounter.  Current Outpatient Medications:    albuterol (VENTOLIN HFA) 108 (90 Base) MCG/ACT inhaler, Inhale 2 puffs into the lungs every 6 (six) hours as needed for wheezing or shortness of breath., Disp: , Rfl:    allopurinol (ZYLOPRIM) 300 MG tablet, Take 1 tablet (300 mg total) by mouth daily., Disp: 90 tablet, Rfl: 3   atorvastatin (LIPITOR) 20 MG tablet, Take 1 tablet (20 mg total) by mouth daily., Disp: 90 tablet, Rfl: 3   carvedilol (COREG) 12.5 MG tablet, TAKE 1 AND 1/2 TABLETS BY  MOUTH TWICE DAILY WITH A  MEAL, Disp: 270 tablet, Rfl: 3   Fluticasone-Umeclidin-Vilant (TRELEGY ELLIPTA) 100-62.5-25 MCG/INH AEPB, Inhale 1 puff into the lungs daily as needed., Disp: , Rfl:    furosemide (LASIX) 20 MG tablet, Take 1 tablet (20 mg total) by mouth daily as needed., Disp: 45 tablet, Rfl: 3   gabapentin (NEURONTIN) 100 MG capsule, TAKE 1 CAPSULE BY MOUTH IN  THE MORNING AND 2 CAPSULES  BY MOUTH AT BEDTIME, Disp: 270 capsule, Rfl: 3   levothyroxine (SYNTHROID) 50 MCG tablet, Take 1 tablet by mouth once daily, Disp: 90 tablet, Rfl: 0   lisinopril-hydrochlorothiazide (ZESTORETIC) 20-25 MG tablet, TAKE 1 TABLET BY MOUTH  DAILY, Disp: 90 tablet, Rfl: 3   Molnupiravir 200 MG CAPS, Take 4 capsules (800 mg total) by mouth 2 (two) times daily for 5 days., Disp: 40 capsule, Rfl: 0   naproxen sodium (ALEVE) 220 MG tablet, Take 220 mg by mouth daily as needed., Disp: , Rfl:    omeprazole (PRILOSEC) 20  MG capsule, Take 1 capsule (20 mg total) by mouth 2 (two) times daily before a meal., Disp: 180 capsule, Rfl: 3   No Known Allergies  Past Medical History:  Diagnosis Date   Anemia    Anxiety    BRBPR (bright red blood per rectum) 02/05/2015   Coronary artery disease (CAD) excluded 01/2014   Low Risk Myoview (false positive) with possible inferior-inferolateral ischemia, but with nonsustained VT --> CATH --> Angiographically Normal Coronary Arteries; coronary CT angiogram showed mild approximately disease but otherwise no obstructive disease.  Coronary calcium score 40.   Depression    FALSE POSITIVE NUCLEAR STRESS CHEST 01/28/2014   Fungal dermatitis 09/29/2011   GERD (gastroesophageal reflux disease)    Gout 04/10/2017   First episode - Left foot 03/2017   Hyperlipidemia    Hyperlipidemia with target LDL less than 100 09/29/2011   Hyperplastic colon polyp    Hypertension    Hypertensive 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 ([place]) 04/26/2017   HYPERTRIGLYCERIDEMIA 07/12/2008   Qualifier: Diagnosis of  By: Birdie Riddle MD, Katherine     Hypothyroidism 02/09/2017   Irregular heart beat 09/29/2011   Lumbar radiculopathy 12/24/2014   Morbid obesity ([place]) 10/15/2015   NICM (nonischemic cardiomyopathy) ([place])    Essentially resolved. Echocardiogram July 2013 showed normal EF greater than 55%. Important septal motion. Normal LV filling pressures. Mild MR and mild anterior MVP   Obesity (BMI 30-39.9)    Osteoarthritis  Positional vertigo    Prediabetes 08/10/2017   Pulmonary emphysema ([place]) 04/29/2015   PFTs 2017 - moderate-severe obstructive disease, some restrictive disease   SOB (shortness of breath) 08/23/2011     Past Surgical History:  Procedure Laterality Date   ABDOMINAL HYSTERECTOMY     APPENDECTOMY  as child   BREAST BIOPSY Right    benign   CHOLECYSTECTOMY     COLONOSCOPY     CORONARY CALCIUM SCORE/CT ANGIOGRAM  07/2017   Calcium score 40.  Mild LAD  stenosis, but no other significant disease.   DOPPLER ECHOCARDIOGRAPHY  July 2013   09/20/11. normal Lv thickness and function with EF greater thatn 55%   Exercise tolerance test - CPET-MET-TEST  July 2013   Submaximal effort. Only 80% of her, therefore peak VO2 of 75% is likely an underestimate. High resting heart rate, achieving 86% of heart rate. --> Unable to interpret due to lack of effort.   GASTRIC BYPASS  1980   GASTRIC BYPASS     KIDNEY STONE SURGERY     LEFT HEART CATHETERIZATION WITH CORONARY ANGIOGRAM N/A 02/01/2014   Procedure: LEFT HEART CATHETERIZATION WITH CORONARY ANGIOGRAM;  Surgeon: Leonie Man, MD;  Location[facility] CATH LAB;  Service: Cardiovascular: Angiographically normal coronaries   NM MYOVIEW LTD  November 2015   4:20 min, 4.6 METS --> DOE, but no chest discomfort; Significant + ST -T wave changes noted with NSVT & PVCs. Images suggest Inferior-inferolateral ischemia.  Low Risk. -- FALSE POSITIVE   Pulmonary Function Tests  July 2013   Increased densities, decreased FVC - consistent with obesity hypoventilation; FEV1 is 58%, FVC 60% of predicted.   TRANSTHORACIC ECHOCARDIOGRAM  04/2017   Mildly reduced EF of 45%.  No regional wall motion normality.  GR 1 DD   UPPER GASTROINTESTINAL ENDOSCOPY      Family History  Problem Relation Age of Onset   Kidney disease Daughter    Multiple sclerosis Daughter    Heart disease Father    Heart disease Mother    Thyroid disease Mother    Heart disease Paternal Grandfather    Colon cancer Other        early 7's.  Genetic testing from maternal side of family.   Colon cancer Maternal Aunt    Breast cancer Maternal Aunt    Lung cancer Sister        smoker   Diabetes Sister    Colon cancer Maternal Uncle    Diabetes Daughter        x3   Heart disease Maternal Aunt    Colon polyps Neg Hx    Esophageal cancer Neg Hx    Gallbladder disease Neg Hx    Rectal cancer Neg Hx    Stomach cancer Neg Hx     Social History    Tobacco Use   Smoking status: Former    Packs/day: 1.00    Years: 30.00    Pack years: 30.00    Types: Cigarettes    Quit date: 01/18/2003    Years since quitting: 17.7   Smokeless tobacco: Never  Substance Use Topics   Alcohol use: Yes    Alcohol/week: 3.0 standard drinks    Types: 3 Cans of beer per week    Comment: drinks 2-3 beers a week   Drug use: No    ROS   Objective:   Vitals: BP 115/64 (BP Location: Right Arm)   Pulse 88   Temp 98 F (36.7 C) (Oral)  Resp (!) 26   SpO2 (!) 89%   Physical Exam Constitutional:      General: She is not in acute distress.    Appearance: Normal appearance. She is well-developed. She is obese. She is ill-appearing. She is not toxic-appearing or diaphoretic.  HENT:     Head: Normocephalic and atraumatic.     Nose: Nose normal.     Mouth/Throat:     Mouth: Mucous membranes are moist.  Eyes:     General: No scleral icterus.       Right eye: No discharge.        Left eye: No discharge.     Extraocular Movements: Extraocular movements intact.     Conjunctiva/sclera: Conjunctivae normal.     Pupils: Pupils are equal, round, and reactive to light.  Cardiovascular:     Rate and Rhythm: Normal rate and regular rhythm.     Pulses: Normal pulses.     Heart sounds: Normal heart sounds. No murmur heard.   No friction rub. No gallop.  Pulmonary:     Effort: No respiratory distress.     Breath sounds: No stridor. No wheezing, rhonchi or rales.     Comments: Labored breathing and decreased lung sounds. Skin:    General: Skin is warm and dry.     Findings: No rash.  Neurological:     Mental Status: She is alert and oriented to person, place, and time.  Psychiatric:        Mood and Affect: Mood normal.        Behavior: Behavior normal.        Thought Content: Thought content normal.        Judgment: Judgment normal.    Assessment and Plan :   PDMP not reviewed this encounter.  1. COVID-19 virus infection   2. Hypoxemia    3. Pulmonary emphysema, unspecified emphysema type ([place])   4. Chronic obstructive pulmonary disease, unspecified COPD type ([place])   5. Hypertensive heart disease with congestive heart failure, unspecified heart failure type [facility])     Patient has failed outpatient management for COVID-19 including treatment with molnupiravir.  Recommended further evaluation and intervention at the [facility] given her multiple risk factors including pulmonary emphysema, COPD, hypertensive heart disease with CHF).  Patient presents with her family member who is agreeable to drive her there as she does not want transport by EMS.   Jaynee Eagles, [place] 10/27/20 1631

## 2020-10-27 NOTE — ED Provider Notes (Signed)
[facility] EMERGENCY DEPARTMENT Provider Note   CSN: [id number] Arrival date & time: 10/27/20  1701     History Chief Complaint  Patient presents with   Shortness of Breath   Weakness    April Combs is a 75 y.o. female.   Shortness of Breath Weakness Associated symptoms: shortness of breath    75 y.o. F with hx of anemia, CAD, depression, HLP, HTN, hypothyroidism, presenting to the ED with SOB.  States she tested + for covid 10/21/20 and completed course of oral molnupivar.  States she thought she was getting better but started getting worse again today with SOB.  States she can only walk about 10 feet or so before getting winded and giving out of breath.  She reports some nausea and diarrhea but denies vomiting.  Appetitie has been very poor, has not really eaten a meal in about a week.  She has been drinking fluids.  Past Medical History:  Diagnosis Date   Anemia    Anxiety    BRBPR (bright red blood per rectum) 02/05/2015   Coronary artery disease (CAD) excluded 01/2014   Low Risk Myoview (false positive) with possible inferior-inferolateral ischemia, but with nonsustained VT --> CATH --> Angiographically Normal Coronary Arteries; coronary CT angiogram showed mild approximately disease but otherwise no obstructive disease.  Coronary calcium score 40.   Depression    FALSE POSITIVE NUCLEAR STRESS CHEST 01/28/2014   Fungal dermatitis 09/29/2011   GERD (gastroesophageal reflux disease)    Gout 04/10/2017   First episode - Left foot 03/2017   Hyperlipidemia    Hyperlipidemia with target LDL less than 100 09/29/2011   Hyperplastic colon polyp    Hypertension    Hypertensive 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 ([place]) 04/26/2017   HYPERTRIGLYCERIDEMIA 07/12/2008   Qualifier: Diagnosis of  By: Birdie Riddle MD, Katherine     Hypothyroidism 02/09/2017   Irregular heart beat 09/29/2011   Lumbar radiculopathy 12/24/2014   Morbid obesity ([place])  10/15/2015   NICM (nonischemic cardiomyopathy) ([place])    Essentially resolved. Echocardiogram July 2013 showed normal EF greater than 55%. Important septal motion. Normal LV filling pressures. Mild MR and mild anterior MVP   Obesity (BMI 30-39.9)    Osteoarthritis    Positional vertigo    Prediabetes 08/10/2017   Pulmonary emphysema ([place]) 04/29/2015   PFTs 2017 - moderate-severe obstructive disease, some restrictive disease   SOB (shortness of breath) 08/23/2011    Patient Active Problem List   Diagnosis Date Noted   Atherosclerosis of aorta ([place]) 12/31/2019   Physical deconditioning 08/18/2018   Bilateral leg weakness 02/15/2018   Prediabetes 08/10/2017   Hypertensive 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 ([place]) 04/26/2017   Gout 04/10/2017   Hypothyroidism 02/09/2017   Morbid obesity ([place]) 10/15/2015   Pulmonary emphysema ([place]) 04/29/2015   Lumbar radiculopathy 12/24/2014   Hx of tobacco use, presenting hazards to health 09/29/2014   Nail fungus 03/11/2014   FALSE POSITIVE NUCLEAR STRESS CHEST 01/28/2014   Chest pain on exertion 01/14/2014   GERD (gastroesophageal reflux disease) 05/15/2012   Positional vertigo 12/22/2011   Irregular heart beat 09/29/2011   Hyperlipidemia with target LDL less than 100 09/29/2011   SOB (shortness of breath) 08/23/2011   Essential hypertension 05/28/2008   OSTEOARTHRITIS 05/28/2008    Past Surgical History:  Procedure Laterality Date   ABDOMINAL HYSTERECTOMY     APPENDECTOMY  as child   BREAST BIOPSY Right    benign  CHOLECYSTECTOMY     COLONOSCOPY     CORONARY CALCIUM SCORE/CT ANGIOGRAM  07/2017   Calcium score 40.  Mild LAD stenosis, but no other significant disease.   DOPPLER ECHOCARDIOGRAPHY  July 2013   09/20/11. normal Lv thickness and function with EF greater thatn 55%   Exercise tolerance test - CPET-MET-TEST  July 2013   Submaximal effort. Only 80% of her, therefore peak VO2 of 75% is likely an underestimate.  High resting heart rate, achieving 86% of heart rate. --> Unable to interpret due to lack of effort.   GASTRIC BYPASS  1980   GASTRIC BYPASS     KIDNEY STONE SURGERY     LEFT HEART CATHETERIZATION WITH CORONARY ANGIOGRAM N/A 02/01/2014   Procedure: LEFT HEART CATHETERIZATION WITH CORONARY ANGIOGRAM;  Surgeon: Leonie Man, MD;  Location: [facility] CATH LAB;  Service: Cardiovascular: Angiographically normal coronaries   NM MYOVIEW LTD  November 2015   4:20 min, 4.6 METS --> DOE, but no chest discomfort; Significant + ST -T wave changes noted with NSVT & PVCs. Images suggest Inferior-inferolateral ischemia.  Low Risk. -- FALSE POSITIVE   Pulmonary Function Tests  July 2013   Increased densities, decreased FVC - consistent with obesity hypoventilation; FEV1 is 58%, FVC 60% of predicted.   TRANSTHORACIC ECHOCARDIOGRAM  04/2017   Mildly reduced EF of 45%.  No regional wall motion normality.  GR 1 DD   UPPER GASTROINTESTINAL ENDOSCOPY       OB History   No obstetric history on file.     Family History  Problem Relation Age of Onset   Kidney disease Daughter    Multiple sclerosis Daughter    Heart disease Father    Heart disease Mother    Thyroid disease Mother    Heart disease Paternal Grandfather    Colon cancer Other        early 65's.  Genetic testing from maternal side of family.   Colon cancer Maternal Aunt    Breast cancer Maternal Aunt    Lung cancer Sister        smoker   Diabetes Sister    Colon cancer Maternal Uncle    Diabetes Daughter        x3   Heart disease Maternal Aunt    Colon polyps Neg Hx    Esophageal cancer Neg Hx    Gallbladder disease Neg Hx    Rectal cancer Neg Hx    Stomach cancer Neg Hx     Social History   Tobacco Use   Smoking status: Former    Packs/day: 1.00    Years: 30.00    Pack years: 30.00    Types: Cigarettes    Quit date: 01/18/2003    Years since quitting: 17.7   Smokeless tobacco: Never  Substance Use Topics   Alcohol use: Yes     Alcohol/week: 3.0 standard drinks    Types: 3 Cans of beer per week    Comment: drinks 2-3 beers a week   Drug use: No    Home Medications Prior to Admission medications   Medication Sig Start Date End Date Taking? Authorizing Provider  albuterol (VENTOLIN HFA) 108 (90 Base) MCG/ACT inhaler Inhale 2 puffs into the lungs every 6 (six) hours as needed for wheezing or shortness of breath.    [provider]  allopurinol (ZYLOPRIM) 300 MG tablet Take 1 tablet (300 mg total) by mouth daily. 02/19/20   Binnie Rail, MD  atorvastatin (LIPITOR) 20 MG tablet  Take 1 tablet (20 mg total) by mouth daily. 02/19/20   Binnie Rail, MD  carvedilol (COREG) 12.5 MG tablet TAKE 1 AND 1/2 TABLETS BY  MOUTH TWICE DAILY WITH A  MEAL 07/21/20   Leonie Man, MD  Fluticasone-Umeclidin-Vilant (TRELEGY ELLIPTA) 100-62.5-25 MCG/INH AEPB Inhale 1 puff into the lungs daily as needed.    [provider]  furosemide (LASIX) 20 MG tablet Take 1 tablet (20 mg total) by mouth daily as needed. 09/29/20   Leonie Man, MD  gabapentin (NEURONTIN) 100 MG capsule TAKE 1 CAPSULE BY MOUTH IN  THE MORNING AND 2 CAPSULES  BY MOUTH AT BEDTIME 07/21/20   Binnie Rail, MD  levothyroxine (SYNTHROID) 50 MCG tablet Take 1 tablet by mouth once daily 10/06/20   Binnie Rail, MD  lisinopril-hydrochlorothiazide (ZESTORETIC) 20-25 MG tablet TAKE 1 TABLET BY MOUTH  DAILY 10/23/20   Leonie Man, MD  Molnupiravir 200 MG CAPS Take 4 capsules (800 mg total) by mouth 2 (two) times daily for 5 days. 10/22/20 10/27/20  Binnie Rail, MD  naproxen sodium (ALEVE) 220 MG tablet Take 220 mg by mouth daily as needed.    [provider]  omeprazole (PRILOSEC) 20 MG capsule Take 1 capsule (20 mg total) by mouth 2 (two) times daily before a meal. 02/19/20   Burns, Claudina Lick, MD    Allergies    Patient has no known allergies.  Review of Systems   Review of Systems  Respiratory:  Positive for shortness of breath.    Neurological:  Positive for weakness.  All other systems reviewed and are negative.  Physical Exam Updated Vital Signs BP (!) 105/56 (BP Location: Right Arm)   Pulse 87   Temp 98.3 F (36.8 C) (Oral)   Resp 17   SpO2 94%   Physical Exam Vitals and nursing note reviewed.  Constitutional:      Appearance: She is well-developed.  HENT:     Head: Normocephalic and atraumatic.     Comments: Voice is hoarse Eyes:     Conjunctiva/sclera: Conjunctivae normal.     Pupils: Pupils are equal, round, and reactive to light.  Cardiovascular:     Rate and Rhythm: Normal rate and regular rhythm.     Heart sounds: Normal heart sounds.  Pulmonary:     Effort: Pulmonary effort is normal.     Breath sounds: Normal breath sounds. No wheezing or rhonchi.     Comments: 92-93% on RA, does drop into upper 80's when shuffling around in bed during exam Abdominal:     General: Bowel sounds are normal.     Palpations: Abdomen is soft.  Musculoskeletal:        General: Normal range of motion.     Cervical back: Normal range of motion.  Skin:    General: Skin is warm and dry.  Neurological:     Mental Status: She is alert and oriented to person, place, and time.    ED Results / Procedures / Treatments   Labs (all labs ordered are listed, but only abnormal results are displayed) Labs Reviewed  CBC WITH DIFFERENTIAL/PLATELET - Abnormal; Notable for the following components:      Result Value   RBC 3.57 (*)    Hemoglobin 11.4 (*)    HCT 35.0 (*)    Abs Immature Granulocytes 0.20 (*)    All other components within normal limits  COMPREHENSIVE METABOLIC PANEL - Abnormal; Notable for the following components:  Glucose, Bld 133 (*)    BUN 26 (*)    Creatinine, Ser 1.19 (*)    Calcium 8.4 (*)    Albumin 3.1 (*)    AST 46 (*)    GFR, Estimated 48 (*)    Anion gap 16 (*)    All other components within normal limits    EKG None  Radiology DG Chest 2 View  Result Date:  10/27/2020 CLINICAL DATA:  Weakness and shortness of breath, history of COVID-19 positivity EXAM: CHEST - 2 VIEW COMPARISON:  04/11/2017 FINDINGS: Cardiac shadow is within normal limits. Aortic calcifications are seen. Lung bases demonstrate some patchy changes consistent with atelectasis and likely related to the recent COVID infection. No sizable effusion is noted. No bony abnormality is seen. IMPRESSION: Bibasilar airspace opacities as described likely related to the recent infection. Electronically Signed   By: Inez Catalina M.D.   On: 10/27/2020 18:04    Procedures Procedures   Medications Ordered in ED Medications  dexamethasone (DECADRON) injection 10 mg (has no administration in time range)    ED Course  I have reviewed the triage vital signs and the nursing notes.  Pertinent labs & imaging results that were available during my care of the patient were reviewed by me and considered in my medical decision making (see chart for details).    MDM Rules/Calculators/A&P                           75 y.o. F here with SOB.  Covid + 10/21/20, completed paxlovid already.  States worsening SOB today, did have noted hypoxia to 89% when presenting to the UC earlier tonight.  Currently in room, saturating around 92-93% but does drop when shuffling around in bed.  Labs are overall reassuring.  CXR with covid pneumonia.  She will require admission.  Ordered decadron and remdesivir.  Discussed with hospitalist, Dr. Tonie Griffith-- will admit for ongoing care.  Final Clinical Impression(s) / ED Diagnoses Final diagnoses:  [id number]    Rx / DC Orders ED Discharge Orders     None        Larene Pickett, PA-C 10/27/20 2356    Merryl Hacker, MD 10/28/20 [phone]

## 2020-10-27 NOTE — ED Triage Notes (Signed)
Positive covid test last Tuesday and took the anti-viral. No improvement in symptoms over the last week. Hx of COPD. C/o cough, SOB exacerbated by exercise, sore throat

## 2020-10-27 NOTE — ED Provider Notes (Signed)
Emergency Medicine Provider Triage Evaluation Note  Arynn Cerreta , a 75 y.o. female  was evaluated in triage.  Pt complains of sob, weakness. Positive covid test and finished antivirals.  Review of Systems  Positive: Sob, weakness Negative: Chest pain  Physical Exam  BP (!) 116/54 (BP Location: Right Arm)   Pulse 86   Temp 98.3 F (36.8 C) (Oral)   Resp 20   SpO2 91%  Gen:   Awake, no distress   Resp:  Normal effort  MSK:   Moves extremities without difficulty  Other:  No tachypnea  Medical Decision Making  Medically screening exam initiated at 5:14 PM.  Appropriate orders placed.  Laroy Apple was informed that the remainder of the evaluation will be completed by another provider, this initial triage assessment does not replace that evaluation, and the importance of remaining in the ED until their evaluation is complete.     Rodney Booze, PA-C 10/27/20 [place], [place], DO 10/27/20 1947

## 2020-10-28 ENCOUNTER — Encounter (HOSPITAL_COMMUNITY): Payer: Self-pay | Admitting: Family Medicine

## 2020-10-28 DIAGNOSIS — I5042 Chronic combined systolic (congestive) and diastolic (congestive) heart failure: Secondary | ICD-10-CM | POA: Insufficient documentation

## 2020-10-28 DIAGNOSIS — J441 Chronic obstructive pulmonary disease with (acute) exacerbation: Secondary | ICD-10-CM

## 2020-10-28 DIAGNOSIS — R0600 Dyspnea, unspecified: Secondary | ICD-10-CM

## 2020-10-28 DIAGNOSIS — R0603 Acute respiratory distress: Secondary | ICD-10-CM

## 2020-10-28 DIAGNOSIS — I5032 Chronic diastolic (congestive) heart failure: Secondary | ICD-10-CM

## 2020-10-28 DIAGNOSIS — J449 Chronic obstructive pulmonary disease, unspecified: Secondary | ICD-10-CM

## 2020-10-28 LAB — RESP PANEL BY RT-PCR (FLU A&B, COVID) ARPGX2
Influenza A by PCR: NEGATIVE
Influenza B by PCR: NEGATIVE
SARS Coronavirus 2 by RT PCR: POSITIVE — AB

## 2020-10-28 LAB — C-REACTIVE PROTEIN: CRP: 32.4 mg/dL — ABNORMAL HIGH (ref <1.0)

## 2020-10-28 LAB — D-DIMER, QUANTITATIVE: D-Dimer, Quant: 1.73 ug/mL-FEU — ABNORMAL HIGH (ref 0.00–0.50)

## 2020-10-28 MED ORDER — HYDROCHLOROTHIAZIDE 25 MG PO TABS
25.0000 mg | ORAL_TABLET | Freq: Every day | ORAL | Status: DC
  Administered 2020-10-28: 25 mg via ORAL
  Filled 2020-10-28: qty 1

## 2020-10-28 MED ORDER — ACETAMINOPHEN 650 MG RE SUPP: 650.0000 mg | Freq: Four times a day (QID) | RECTAL | Status: DC | PRN

## 2020-10-28 MED ORDER — ONDANSETRON HCL 4 MG/2ML IJ SOLN: 4.0000 mg | Freq: Four times a day (QID) | INTRAMUSCULAR | Status: DC | PRN

## 2020-10-28 MED ORDER — TRAZODONE HCL 50 MG PO TABS
50.0000 mg | ORAL_TABLET | Freq: Every evening | ORAL | Status: DC | PRN
  Administered 2020-10-28 – 2020-10-31 (×4): 50 mg via ORAL
  Filled 2020-10-28 (×4): qty 1

## 2020-10-28 MED ORDER — FLUTICASONE-UMECLIDIN-VILANT 100-62.5-25 MCG/INH IN AEPB: 1.0000 | INHALATION_SPRAY | Freq: Every day | RESPIRATORY_TRACT | Status: DC

## 2020-10-28 MED ORDER — GUAIFENESIN-DM 100-10 MG/5ML PO SYRP
10.0000 mL | ORAL_SOLUTION | Freq: Four times a day (QID) | ORAL | Status: DC | PRN
  Administered 2020-10-28 – 2020-10-31 (×8): 10 mL via ORAL
  Filled 2020-10-28 (×9): qty 10

## 2020-10-28 MED ORDER — LISINOPRIL 20 MG PO TABS: 20.0000 mg | ORAL_TABLET | Freq: Every day | ORAL | Status: DC

## 2020-10-28 MED ORDER — ALBUTEROL SULFATE HFA 108 (90 BASE) MCG/ACT IN AERS
2.0000 | INHALATION_SPRAY | RESPIRATORY_TRACT | Status: DC | PRN
  Filled 2020-10-28: qty 6.7

## 2020-10-28 MED ORDER — ASCORBIC ACID 500 MG PO TABS
500.0000 mg | ORAL_TABLET | Freq: Every day | ORAL | Status: DC
  Administered 2020-10-28 – 2020-11-01 (×5): 500 mg via ORAL
  Filled 2020-10-28 (×5): qty 1

## 2020-10-28 MED ORDER — FUROSEMIDE 20 MG PO TABS: 20.0000 mg | ORAL_TABLET | Freq: Every day | ORAL | Status: DC

## 2020-10-28 MED ORDER — ACETAMINOPHEN 325 MG PO TABS
650.0000 mg | ORAL_TABLET | Freq: Four times a day (QID) | ORAL | Status: DC | PRN
  Administered 2020-10-28 – 2020-10-29 (×3): 650 mg via ORAL
  Filled 2020-10-28 (×3): qty 2

## 2020-10-28 MED ORDER — CARVEDILOL 12.5 MG PO TABS
12.5000 mg | ORAL_TABLET | Freq: Two times a day (BID) | ORAL | Status: DC
  Administered 2020-10-28 – 2020-11-01 (×9): 12.5 mg via ORAL
  Filled 2020-10-28: qty 1
  Filled 2020-10-28: qty 4
  Filled 2020-10-28: qty 1
  Filled 2020-10-28: qty 4
  Filled 2020-10-28 (×5): qty 1

## 2020-10-28 MED ORDER — ENOXAPARIN SODIUM 40 MG/0.4ML IJ SOSY
40.0000 mg | PREFILLED_SYRINGE | INTRAMUSCULAR | Status: DC
  Administered 2020-10-28 – 2020-11-01 (×5): 40 mg via SUBCUTANEOUS
  Filled 2020-10-28 (×5): qty 0.4

## 2020-10-28 MED ORDER — NIRMATRELVIR/RITONAVIR (PAXLOVID)TABLET: 3.0000 | ORAL_TABLET | Freq: Two times a day (BID) | ORAL | Status: DC

## 2020-10-28 MED ORDER — ONDANSETRON HCL 4 MG PO TABS: 4.0000 mg | ORAL_TABLET | Freq: Four times a day (QID) | ORAL | Status: DC | PRN

## 2020-10-28 MED ORDER — LACTATED RINGERS IV SOLN: INTRAVENOUS | Status: DC

## 2020-10-28 MED ORDER — UMECLIDINIUM BROMIDE 62.5 MCG/INH IN AEPB
1.0000 | INHALATION_SPRAY | Freq: Every day | RESPIRATORY_TRACT | Status: DC
  Administered 2020-10-28 – 2020-11-01 (×5): 1 via RESPIRATORY_TRACT
  Filled 2020-10-28 (×2): qty 7

## 2020-10-28 MED ORDER — ZINC SULFATE 220 (50 ZN) MG PO CAPS
220.0000 mg | ORAL_CAPSULE | Freq: Every day | ORAL | Status: DC
  Administered 2020-10-28 – 2020-11-01 (×5): 220 mg via ORAL
  Filled 2020-10-28 (×5): qty 1

## 2020-10-28 MED ORDER — NIRMATRELVIR/RITONAVIR (PAXLOVID) TABLET (RENAL DOSING)
2.0000 | ORAL_TABLET | Freq: Two times a day (BID) | ORAL | Status: DC
Start: 2020-10-28 — End: 2020-10-29
  Administered 2020-10-28 – 2020-10-29 (×4): 2 via ORAL
  Filled 2020-10-28 (×2): qty 20

## 2020-10-28 MED ORDER — ATORVASTATIN CALCIUM 10 MG PO TABS
20.0000 mg | ORAL_TABLET | Freq: Every day | ORAL | Status: DC
  Administered 2020-10-28 – 2020-11-01 (×5): 20 mg via ORAL
  Filled 2020-10-28 (×5): qty 2

## 2020-10-28 MED ORDER — LEVOTHYROXINE SODIUM 50 MCG PO TABS
50.0000 ug | ORAL_TABLET | Freq: Every day | ORAL | Status: DC
  Administered 2020-10-28 – 2020-11-01 (×5): 50 ug via ORAL
  Filled 2020-10-28 (×5): qty 1

## 2020-10-28 MED ORDER — METHYLPREDNISOLONE SODIUM SUCC 125 MG IJ SOLR
125.0000 mg | Freq: Two times a day (BID) | INTRAMUSCULAR | Status: AC
  Administered 2020-10-28 – 2020-10-29 (×3): 125 mg via INTRAVENOUS
  Filled 2020-10-28 (×3): qty 2

## 2020-10-28 MED ORDER — LISINOPRIL-HYDROCHLOROTHIAZIDE 20-25 MG PO TABS: 1.0000 | ORAL_TABLET | Freq: Every day | ORAL | Status: DC

## 2020-10-28 MED ORDER — FLUTICASONE FUROATE-VILANTEROL 100-25 MCG/INH IN AEPB
1.0000 | INHALATION_SPRAY | Freq: Every day | RESPIRATORY_TRACT | Status: DC
  Administered 2020-10-28 – 2020-11-01 (×5): 1 via RESPIRATORY_TRACT
  Filled 2020-10-28: qty 28

## 2020-10-28 NOTE — ED Notes (Signed)
Patient assisted with brief change and repositioned to left side lying position.

## 2020-10-28 NOTE — H&P (Addendum)
History and Physical    Ninamarie Keel HOZ:[id number] DOB: 1946/03/15 DOA: 10/27/2020  PCP: Binnie Rail, MD   Patient coming from: Home  Chief Complaint: SOB, cough  HPI: April Combs is a 75 y.o. female with medical history significant for HFpEF, HTN, HLD, COPD, hypothyroidism, obesity who presents for evaluation of shortness of breath.  She states that she tested positive for COVID-19 on October 21, 2020 and was treated with a course of Molnupivar which she completed yesterday. She states she may have started to feel a little better with the therapy but since finishing it has increased in fatigue and SOB. She has a dry cough.  She reports shortness of breath is worsened with any exertion and she can only walk about 10 feet before she has to stop and rest due to being very winded.  She reports she has had some nausea has not had any vomiting and she denies any diarrhea.  She reports she has had decreased appetite for the last 4 to 5 days and has not eaten much.  She has been trying to drink plenty of fluids to stay hydrated.  While sitting and resting her oxygen saturation stays in the 92 to 95% range.  If she gets up and ambulates her oxygen saturation drops to 88 to 89% on room air.  She reports she has been vaccinated and boosted for COVID-19 with the last shot being in January 2022. She states that she went to a family barbecue a few days before she became symptomatic and one of her relatives there was coughing and sick in the next day tested positive for COVID-19.  Since then multiple people that have been at that barbecue have come down with COVID-19 infections.  She reports she took a home COVID test that was positive on October 21, 2020.  There is no copy of the test in the chart. Denies tobacco, alcohol or illicit drug use.  ED Course: Ms. Solimine has been hemodynamically stable in the emergency room.  She did have low oxygenation when ambulated and was started on oxygen by nasal  cannula.  Open swab was been obtained since there is no hardcopy to verify her previous positive test result.  She was treated with molmupirvar which has poor efficacy versus COVID omicron variants that are circulating and therefore will need further treatment.  Discussed with pharmacy and will treat with paxolovid for 5 days. Given decadron in the ER.  CBC is unremarkable.  Sodium 136 potassium 3.5 chloride 98 bicarb 22 creatinine 1.19 from baseline of 1.01 BUN 26 glucose 133 AST 46 ALT 33 alkaline phosphatase 81.  X-ray shows bibasilar hazy opacities consistent with COVID infection.  Hospitalist service asked to evaluate and admit patient for further management  Review of Systems:  General: Reports fatigue. Denies fever, chills, weight loss, night sweats. Reports decreased appetite HENT: Denies head trauma, headache, denies change in hearing, tinnitus. Reports nasal congestion. Reports sore throat. Denies difficulty swallowing Eyes: Denies blurry vision, pain in eye, drainage.  Denies discoloration of eyes. Neck: Denies pain.  Denies swelling.  Denies pain with movement. Cardiovascular: Denies chest pain, palpitations.  Denies edema.  Denies orthopnea Respiratory: Reports shortness of breath with exertion, has cough.  Denies wheezing.  Denies sputum production Gastrointestinal: Denies abdominal pain, swelling.  Denies nausea, vomiting, diarrhea.  Denies melena.  Denies hematemesis. Musculoskeletal: Denies limitation of movement.  Denies deformity or swelling.  Denies pain.  Denies arthralgias or myalgias. Genitourinary: Denies pelvic pain.  Denies urinary frequency or hesitancy.  Denies dysuria.  Skin: Denies rash.  Denies petechiae, purpura, ecchymosis. Neurological:  Denies syncope. Denies seizure activity. Denies paresthesia. Denies slurred speech, drooping face. Denies visual change. Psychiatric: Denies depression, anxiety. Denies hallucinations.  Past Medical History:  Diagnosis Date    Anemia    Anxiety    BRBPR (bright red blood per rectum) 02/05/2015   Coronary artery disease (CAD) excluded 01/2014   Low Risk Myoview (false positive) with possible inferior-inferolateral ischemia, but with nonsustained VT --> CATH --> Angiographically Normal Coronary Arteries; coronary CT angiogram showed mild approximately disease but otherwise no obstructive disease.  Coronary calcium score 40.   Depression    FALSE POSITIVE NUCLEAR STRESS CHEST 01/28/2014   Fungal dermatitis 09/29/2011   GERD (gastroesophageal reflux disease)    Gout 04/10/2017   First episode - Left foot 03/2017   Hyperlipidemia    Hyperlipidemia with target LDL less than 100 09/29/2011   Hyperplastic colon polyp    Hypertension    Hypertensive 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 ([place]) 04/26/2017   HYPERTRIGLYCERIDEMIA 07/12/2008   Qualifier: Diagnosis of  By: Birdie Riddle MD, Katherine     Hypothyroidism 02/09/2017   Irregular heart beat 09/29/2011   Lumbar radiculopathy 12/24/2014   Morbid obesity ([place]) 10/15/2015   NICM (nonischemic cardiomyopathy) ([place])    Essentially resolved. Echocardiogram July 2013 showed normal EF greater than 55%. Important septal motion. Normal LV filling pressures. Mild MR and mild anterior MVP   Obesity (BMI 30-39.9)    Osteoarthritis    Positional vertigo    Prediabetes 08/10/2017   Pulmonary emphysema ([place]) 04/29/2015   PFTs 2017 - moderate-severe obstructive disease, some restrictive disease   SOB (shortness of breath) 08/23/2011    Past Surgical History:  Procedure Laterality Date   ABDOMINAL HYSTERECTOMY     APPENDECTOMY  as child   BREAST BIOPSY Right    benign   CHOLECYSTECTOMY     COLONOSCOPY     CORONARY CALCIUM SCORE/CT ANGIOGRAM  07/2017   Calcium score 40.  Mild LAD stenosis, but no other significant disease.   DOPPLER ECHOCARDIOGRAPHY  July 2013   09/20/11. normal Lv thickness and function with EF greater thatn 55%   Exercise tolerance test -  CPET-MET-TEST  July 2013   Submaximal effort. Only 80% of her, therefore peak VO2 of 75% is likely an underestimate. High resting heart rate, achieving 86% of heart rate. --> Unable to interpret due to lack of effort.   GASTRIC BYPASS  1980   GASTRIC BYPASS     KIDNEY STONE SURGERY     LEFT HEART CATHETERIZATION WITH CORONARY ANGIOGRAM N/A 02/01/2014   Procedure: LEFT HEART CATHETERIZATION WITH CORONARY ANGIOGRAM;  Surgeon: Leonie Man, MD;  Location: [facility] CATH LAB;  Service: Cardiovascular: Angiographically normal coronaries   NM MYOVIEW LTD  November 2015   4:20 min, 4.6 METS --> DOE, but no chest discomfort; Significant + ST -T wave changes noted with NSVT & PVCs. Images suggest Inferior-inferolateral ischemia.  Low Risk. -- FALSE POSITIVE   Pulmonary Function Tests  July 2013   Increased densities, decreased FVC - consistent with obesity hypoventilation; FEV1 is 58%, FVC 60% of predicted.   TRANSTHORACIC ECHOCARDIOGRAM  04/2017   Mildly reduced EF of 45%.  No regional wall motion normality.  GR 1 DD   UPPER GASTROINTESTINAL ENDOSCOPY      Social History  reports that she quit smoking about 17 years ago. Her smoking use included cigarettes. She  has a 30.00 pack-year smoking history. She has never used smokeless tobacco. She reports current alcohol use of about 3.0 standard drinks per week. She reports that she does not use drugs.  No Known Allergies  Family History  Problem Relation Age of Onset   Kidney disease Daughter    Multiple sclerosis Daughter    Heart disease Father    Heart disease Mother    Thyroid disease Mother    Heart disease Paternal Grandfather    Colon cancer Other        early 28's.  Genetic testing from maternal side of family.   Colon cancer Maternal Aunt    Breast cancer Maternal Aunt    Lung cancer Sister        smoker   Diabetes Sister    Colon cancer Maternal Uncle    Diabetes Daughter        x3   Heart disease Maternal Aunt    Colon polyps Neg Hx     Esophageal cancer Neg Hx    Gallbladder disease Neg Hx    Rectal cancer Neg Hx    Stomach cancer Neg Hx      Prior to Admission medications   Medication Sig Start Date End Date Taking? Authorizing Provider  albuterol (VENTOLIN HFA) 108 (90 Base) MCG/ACT inhaler Inhale 2 puffs into the lungs every 6 (six) hours as needed for wheezing or shortness of breath.    [provider]  allopurinol (ZYLOPRIM) 300 MG tablet Take 1 tablet (300 mg total) by mouth daily. 02/19/20   Binnie Rail, MD  atorvastatin (LIPITOR) 20 MG tablet Take 1 tablet (20 mg total) by mouth daily. 02/19/20   Binnie Rail, MD  carvedilol (COREG) 12.5 MG tablet TAKE 1 AND 1/2 TABLETS BY  MOUTH TWICE DAILY WITH A  MEAL 07/21/20   Leonie Man, MD  Fluticasone-Umeclidin-Vilant (TRELEGY ELLIPTA) 100-62.5-25 MCG/INH AEPB Inhale 1 puff into the lungs daily as needed.    [provider]  furosemide (LASIX) 20 MG tablet Take 1 tablet (20 mg total) by mouth daily as needed. 09/29/20   Leonie Man, MD  gabapentin (NEURONTIN) 100 MG capsule TAKE 1 CAPSULE BY MOUTH IN  THE MORNING AND 2 CAPSULES  BY MOUTH AT BEDTIME 07/21/20   Binnie Rail, MD  levothyroxine (SYNTHROID) 50 MCG tablet Take 1 tablet by mouth once daily 10/06/20   Binnie Rail, MD  lisinopril-hydrochlorothiazide (ZESTORETIC) 20-25 MG tablet TAKE 1 TABLET BY MOUTH  DAILY 10/23/20   Leonie Man, MD  naproxen sodium (ALEVE) 220 MG tablet Take 220 mg by mouth daily as needed.    [provider]  omeprazole (PRILOSEC) 20 MG capsule Take 1 capsule (20 mg total) by mouth 2 (two) times daily before a meal. 02/19/20   Binnie Rail, MD    Physical Exam: Vitals:   10/27/20 1707 10/27/20 2024 10/27/20 2330  BP: (!) 116/54 (!) 105/56 130/65  Pulse: 86 87 92  Resp: 20 17 (!) 23  Temp: 98.3 F (36.8 C)    TempSrc: Oral    SpO2: 91% 94% 94%    Constitutional: NAD, calm, comfortable Vitals:   10/27/20 1707 10/27/20 2024 10/27/20 2330   BP: (!) 116/54 (!) 105/56 130/65  Pulse: 86 87 92  Resp: 20 17 (!) 23  Temp: 98.3 F (36.8 C)    TempSrc: Oral    SpO2: 91% 94% 94%   General: WDWN, Alert and oriented x3.  Eyes: EOMI,  PERRL, conjunctivae normal.  Sclera nonicteric HENT:  [place]/AT, external ears normal.  Nares with mild clear rhinorrhea without epistasis.  Mucous membranes are moist. Posterior pharynx clear  Neck: Soft, normal range of motion, supple, no masses, Trachea midline Respiratory: Equal breath sounds with diffuse rales, no wheezing, no crackles. Normal respiratory effort. No accessory muscle use.  Cardiovascular: Regular rate and rhythm, no murmurs / rubs / gallops. No extremity edema.   Abdomen: Soft, no tenderness, nondistended, no rebound or guarding.  No masses palpated. Bowel sounds normoactive Musculoskeletal: FROM. no cyanosis. No joint deformity upper and lower extremities. Normal muscle tone.  Skin: Warm, dry, intact no rashes, lesions, ulcers. No induration Neurologic: CN 2-12 grossly intact.  Normal speech.  Sensation intact to touch. Strength 5/5 in all extremities. No tremor Psychiatric: Normal judgment and insight.  Normal mood.    Labs on Admission: I have personally reviewed following labs and imaging studies  CBC: Recent Labs  Lab 10/27/20 1719  WBC 9.8  NEUTROABS 7.5  HGB 11.4*  HCT 35.0*  MCV 98.0  PLT 664    Basic Metabolic Panel: Recent Labs  Lab 10/27/20 1719  NA 136  K 3.5  CL 98  CO2 22  GLUCOSE 133*  BUN 26*  CREATININE 1.19*  CALCIUM 8.4*    GFR: CrCl cannot be calculated (Unknown ideal weight.).  Liver Function Tests: Recent Labs  Lab 10/27/20 1719  AST 46*  ALT 33  ALKPHOS 81  BILITOT 1.2  PROT 7.0  ALBUMIN 3.1*    Urine analysis: No results found for: COLORURINE, APPEARANCEUR, LABSPEC, PHURINE, GLUCOSEU, HGBUR, BILIRUBINUR, KETONESUR, PROTEINUR, UROBILINOGEN, NITRITE, LEUKOCYTESUR  Radiological Exams on Admission: DG Chest 2 View  Result Date:  10/27/2020 CLINICAL DATA:  Weakness and shortness of breath, history of COVID-19 positivity EXAM: CHEST - 2 VIEW COMPARISON:  04/11/2017 FINDINGS: Cardiac shadow is within normal limits. Aortic calcifications are seen. Lung bases demonstrate some patchy changes consistent with atelectasis and likely related to the recent COVID infection. No sizable effusion is noted. No bony abnormality is seen. IMPRESSION: Bibasilar airspace opacities as described likely related to the recent infection. Electronically Signed   By: Inez Catalina M.D.   On: 10/27/2020 18:04    EKG: Independently reviewed.  EKG shows normal sinus rhythm with nonspecific ST changes.  No acute ST elevation or depression.  QTc 423  Assessment/Plan Principal Problem:   Pneumonia due to COVID-19 virus Started on Paxolovid. Pt was treat as an outpatient with Molnupiravir which is not as efficacious as Paxolovid for oral therapy.  Discussed this with pharmacy and they agree with using Paxolovid instead of Remdisivir in this case. Supplemental oxygen provided keep O2 sat between 92 to 96% as needed. Pt has desaturation of oxygen if ambulates or exerts herself. Placed on 2 L/min by n/c in Er.  Albuterol MDI with spacer every 4 hours as needed for wheezing cough, shortness of breath Solumedrol 125 mg IV q 12 hrs x 3 doses Antitussives provided Incentive spirometer every 1-2 hours while awake Check Covid inflammatory labs of CRP and D-dimer. If D-dimer is positive will check CTA chest to make sure no PE present and if it is then will need therapeutic anticoagulation to treat it.  Active Problems:   Essential hypertension Continue lisinopril hct, coreg. Monitor BP    COPD (chronic obstructive pulmonary disease)  Continue home dose of Treology inhaler    Chronic heart failure with preserved ejection fraction (HFpEF)  Continue coreg, lisinopril hct, lipitor, lasix. Monitor  I&Os.     Dyspnea Secondary to Covid infection. Supplemental oxygen  as needed. Albuterol MDI.     AKI Mild. Gentle IVF hydration. Recheck renal function with labs in am    Hypothyroidism Continue synthroid.     DVT prophylaxis: Lovenox for DVT prophylaxis Code Status:   Full Code  Family Communication:  Diagnosis and plan discussed with patient and her daughter who is at bedside.  They verbalized understanding and agree with plan.  Further recommendations to follow as clinical indicated Disposition Plan:   Patient is from:  Home  Anticipated DC to:  Home  Anticipated DC date:  Anticipate 2 midnight or more stay in hospital to treat acute condition  Admission status:  Inpatient   Yevonne Aline Itha Kroeker MD [facility]  How to contact the [facility] Attending or Consulting provider [place] or covering provider during after hours [place], for this patient?   Check the care team in [facility] and look for a) attending/consulting [facility] provider listed and b) the [facility] team listed Log into [email or web address] and use [place]'s universal password to access. If you do not have the password, please contact the hospital operator. Locate the [facility] provider you are looking for under [facility] and page to a number that you can be directly reached. If you still have difficulty reaching the provider, please page the [facility] (Director on Call) for the Hospitalists listed on amion for assistance.  10/28/2020, 12:33 AM    Addendum: Covid test is positive. Continue current treatment plan.

## 2020-10-28 NOTE — Progress Notes (Signed)
PROGRESS NOTE    April Combs  [id number] DOB: [birth year] DOA: 10/27/2020 PCP: Binnie Rail, MD   Brief Narrative:  April Combs is a 75 y.o. female with medical history significant for HFpEF, HTN, HLD, COPD, hypothyroidism, obesity who presents for evaluation of shortness of breath.  She states that she tested positive for COVID-19 on October 21, 2020 and was treated with a course of Molnupivar which she completed the day prior to admission. Acutely worsening symptoms over the past 48h with notable hypoxia in the 80s at intake - admitted for oxygen supplementation and Paxlovid.  Assessment & Plan:  Acute hypoxic respiratory failure secondary to COVID19 pneumonia, POA Patient noted to be hypoxic at intake sats in the 80s on room air, improved with oxygen supplementation -Failure of outpatient Lagevrio (Molnupiravir) -transitioning to Paxlovid x 5 days -Continue supportive care including oxygen, inhalers, early ambulation, proning incentive spirometry and flutter -Continue aggressive steroid treatment given ongoing symptoms despite outpatient treatment -D-dimer minimally elevated, suspected given acute COVID infection  Essential hypertension -Continue lisinopril HCTZ carvedilol   COPD (chronic obstructive pulmonary disease)  Cannot rule out concurrent exacerbation given above Continue home Trelegy, steroids as above   Chronic heart failure with preserved ejection fraction (HFpEF)  Continue coreg, lisinopril hct, lipitor  Patient appears hypovolemic, continue IV fluids, furosemide discontinued   AKI Continue IV fluids as above, hold furosemide   Hypothyroidism Continue synthroid.     DVT prophylaxis:      Lovenox for DVT prophylaxis Code Status:                         Full Code  Family Communication:      None present  Status is: Inpatient  Dispo: The patient is from: Home              Anticipated d/c is to: To be determined              Anticipated d/c date  is: >72h              Patient currently not medically stable for discharge  Consultants:  None  Procedures:  None  Antimicrobials:  None  Subjective: No acute issues or events overnight, symptoms appear to be improving but not yet back to baseline otherwise denies chest pain headache fever chills nausea vomiting diarrhea or constipation  Objective: Vitals:   10/27/20 2330 10/28/20 0300 10/28/20 0400 10/28/20 0647  BP: 130/65 (!) 117/54 107/64 120/81  Pulse: 92 82 76 92  Resp: (!) 23 (!) 27 (!) 27 (!) 24  Temp:    (!) 97.1 F (36.2 C)  TempSrc:    Oral  SpO2: 94% 91% 91% 95%   No intake or output data in the 24 hours ending 10/28/20 0743 There were no vitals filed for this visit.  Examination:  General:  Pleasantly resting in bed, No acute distress. HEENT:  Normocephalic atraumatic.  Sclerae nonicteric, noninjected.  Extraocular movements intact bilaterally. Neck:  Without mass or deformity.  Trachea is midline. Lungs: Diffuse scant rhonchi without overt wheeze or rales. Heart:  Regular rate and rhythm.  Without murmurs, rubs, or gallops. Abdomen:  Soft, nontender, nondistended.  Without guarding or rebound. Extremities: Without cyanosis, clubbing, edema, or obvious deformity. Vascular:  Dorsalis pedis and posterior tibial pulses palpable bilaterally. Skin:  Warm and dry, no erythema, no ulcerations.   Data Reviewed: I have personally reviewed following labs and imaging studies  CBC: Recent Labs  Lab 10/27/20 1719  WBC 9.8  NEUTROABS 7.5  HGB 11.4*  HCT 35.0*  MCV 98.0  PLT 123XX123   Basic Metabolic Panel: Recent Labs  Lab 10/27/20 1719  NA 136  K 3.5  CL 98  CO2 22  GLUCOSE 133*  BUN 26*  CREATININE 1.19*  CALCIUM 8.4*   GFR: CrCl cannot be calculated (Unknown ideal weight.). Liver Function Tests: Recent Labs  Lab 10/27/20 1719  AST 46*  ALT 33  ALKPHOS 81  BILITOT 1.2  PROT 7.0  ALBUMIN 3.1*   No results for input(s): LIPASE, AMYLASE in  the last 168 hours. No results for input(s): AMMONIA in the last 168 hours. Coagulation Profile: No results for input(s): INR, PROTIME in the last 168 hours. Cardiac Enzymes: No results for input(s): CKTOTAL, CKMB, CKMBINDEX, TROPONINI in the last 168 hours. BNP (last 3 results) No results for input(s): PROBNP in the last 8760 hours. HbA1C: No results for input(s): HGBA1C in the last 72 hours. CBG: No results for input(s): GLUCAP in the last 168 hours. Lipid Profile: No results for input(s): CHOL, HDL, LDLCALC, TRIG, CHOLHDL, LDLDIRECT in the last 72 hours. Thyroid Function Tests: No results for input(s): TSH, T4TOTAL, FREET4, T3FREE, THYROIDAB in the last 72 hours. Anemia Panel: No results for input(s): VITAMINB12, FOLATE, FERRITIN, TIBC, IRON, RETICCTPCT in the last 72 hours. Sepsis Labs: No results for input(s): PROCALCITON, LATICACIDVEN in the last 168 hours.  Recent Results (from the past 240 hour(s))  Resp Panel by RT-PCR (Flu A&B, Covid) Nasopharyngeal Swab     Status: Abnormal   Collection Time: 10/28/20  2:40 AM   Specimen: Nasopharyngeal Swab; Nasopharyngeal(NP) swabs in vial transport medium  Result Value Ref Range Status   SARS Coronavirus 2 by RT PCR POSITIVE (A) NEGATIVE Final    Comment: RESULT CALLED TO, READ BACK BY AND VERIFIED WITH: RN CRYSTAL BLACK 10/28/20'@03'$ :29 BY TW (NOTE) SARS-CoV-2 target nucleic acids are DETECTED.  The SARS-CoV-2 RNA is generally detectable in upper respiratory specimens during the acute phase of infection. Positive results are indicative of the presence of the identified virus, but do not rule out bacterial infection or co-infection with other pathogens not detected by the test. Clinical correlation with patient history and other diagnostic information is necessary to determine patient infection status. The expected result is Negative.  Fact Sheet for Patients: EntrepreneurPulse.com.au  Fact Sheet for Healthcare  Providers: IncredibleEmployment.be  This test is not yet approved or cleared by the [place] FDA and  has been authorized for detection and/or diagnosis of SARS-CoV-2 by FDA under an Emergency Use Authorization (EUA).  This EUA will remain in effect (meaning this test can  be used) for the duration of  the COVID-19 declaration under Section 564(b)(1) of the Act, 21 U.S.C. section 360bbb-3(b)(1), unless the authorization is terminated or revoked sooner.     Influenza A by PCR NEGATIVE NEGATIVE Final   Influenza B by PCR NEGATIVE NEGATIVE Final    Comment: (NOTE) The Xpert Xpress SARS-CoV-2/FLU/RSV plus assay is intended as an aid in the diagnosis of influenza from Nasopharyngeal swab specimens and should not be used as a sole basis for treatment. Nasal washings and aspirates are unacceptable for Xpert Xpress SARS-CoV-2/FLU/RSV testing.  Fact Sheet for Patients: EntrepreneurPulse.com.au  Fact Sheet for Healthcare Providers: IncredibleEmployment.be  This test is not yet approved or cleared by the [place] FDA and has been authorized for detection and/or diagnosis of SARS-CoV-2 by FDA under an Emergency Use Authorization (EUA). This EUA will  remain in effect (meaning this test can be used) for the duration of the COVID-19 declaration under Section 564(b)(1) of the Act, 21 U.S.C. section 360bbb-3(b)(1), unless the authorization is terminated or revoked.  Performed at [facility], [place] [address], [place], [place] [address]          Radiology Studies: DG Chest 2 View  Result Date: 10/27/2020 CLINICAL DATA:  Weakness and shortness of breath, history of COVID-19 positivity EXAM: CHEST - 2 VIEW COMPARISON:  04/11/2017 FINDINGS: Cardiac shadow is within normal limits. Aortic calcifications are seen. Lung bases demonstrate some patchy changes consistent with atelectasis and likely related to the recent COVID  infection. No sizable effusion is noted. No bony abnormality is seen. IMPRESSION: Bibasilar airspace opacities as described likely related to the recent infection. Electronically Signed   By: Inez Catalina M.D.   On: 10/27/2020 18:04    Scheduled Meds:  vitamin C  500 mg Oral Daily   atorvastatin  20 mg Oral Daily   carvedilol  12.5 mg Oral BID WC   enoxaparin (LOVENOX) injection  40 mg Subcutaneous Q24H   fluticasone furoate-vilanterol  1 puff Inhalation Daily   furosemide  20 mg Oral Daily   hydrochlorothiazide  25 mg Oral Daily   levothyroxine  50 mcg Oral Daily   lisinopril  20 mg Oral Daily   methylPREDNISolone (SOLU-MEDROL) injection  125 mg Intravenous Q12H   nirmatrelvir/ritonavir EUA (renal dosing)  2 tablet Oral BID   umeclidinium bromide  1 puff Inhalation Daily   zinc sulfate  220 mg Oral Daily   Continuous Infusions:  lactated ringers 75 mL/hr at 10/28/20 0159     LOS: 1 day   Time spent: 18mn  Caileigh Canche C Monserrath Junio, DO [facility]  If 7PM-7AM, please contact night-coverage [email or web address]  10/28/2020, 7:43 AM

## 2020-10-28 NOTE — ED Notes (Signed)
Report given to crystal rn

## 2020-10-29 LAB — COMPREHENSIVE METABOLIC PANEL
ALT: 46 U/L — ABNORMAL HIGH (ref 0–44)
AST: 57 U/L — ABNORMAL HIGH (ref 15–41)
Albumin: 2.8 g/dL — ABNORMAL LOW (ref 3.5–5.0)
Alkaline Phosphatase: 73 U/L (ref 38–126)
Anion gap: 15 (ref 5–15)
BUN: 51 mg/dL — ABNORMAL HIGH (ref 8–23)
CO2: 20 mmol/L — ABNORMAL LOW (ref 22–32)
Calcium: 8 mg/dL — ABNORMAL LOW (ref 8.9–10.3)
Chloride: 98 mmol/L (ref 98–111)
Creatinine, Ser: 1.89 mg/dL — ABNORMAL HIGH (ref 0.44–1.00)
GFR, Estimated: 28 mL/min — ABNORMAL LOW (ref >60)
Glucose, Bld: 216 mg/dL — ABNORMAL HIGH (ref 70–99)
Potassium: 3.8 mmol/L (ref 3.5–5.1)
Sodium: 133 mmol/L — ABNORMAL LOW (ref 135–145)
Total Bilirubin: 0.9 mg/dL (ref 0.3–1.2)
Total Protein: 6.5 g/dL (ref 6.5–8.1)

## 2020-10-29 LAB — CBC WITH DIFFERENTIAL/PLATELET
Abs Immature Granulocytes: 0.22 10*3/uL — ABNORMAL HIGH (ref 0.00–0.07)
Basophils Absolute: 0 10*3/uL (ref 0.0–0.1)
Basophils Relative: 0 %
Eosinophils Absolute: 0 10*3/uL (ref 0.0–0.5)
Eosinophils Relative: 0 %
HCT: 32.7 % — ABNORMAL LOW (ref 36.0–46.0)
Hemoglobin: 11.1 g/dL — ABNORMAL LOW (ref 12.0–15.0)
Immature Granulocytes: 2 %
Lymphocytes Relative: 8 %
Lymphs Abs: 0.9 10*3/uL (ref 0.7–4.0)
MCH: 32.6 pg (ref 26.0–34.0)
MCHC: 33.9 g/dL (ref 30.0–36.0)
MCV: 95.9 fL (ref 80.0–100.0)
Monocytes Absolute: 0.5 10*3/uL (ref 0.1–1.0)
Monocytes Relative: 4 %
Neutro Abs: 9.6 10*3/uL — ABNORMAL HIGH (ref 1.7–7.7)
Neutrophils Relative %: 86 %
Platelets: 269 10*3/uL (ref 150–400)
RBC: 3.41 MIL/uL — ABNORMAL LOW (ref 3.87–5.11)
RDW: 14.4 % (ref 11.5–15.5)
WBC: 11.2 10*3/uL — ABNORMAL HIGH (ref 4.0–10.5)
nRBC: 0 % (ref 0.0–0.2)

## 2020-10-29 LAB — C-REACTIVE PROTEIN: CRP: 23.3 mg/dL — ABNORMAL HIGH (ref <1.0)

## 2020-10-29 MED ORDER — LOPERAMIDE HCL 2 MG PO CAPS
2.0000 mg | ORAL_CAPSULE | ORAL | Status: DC | PRN
  Administered 2020-10-29: 2 mg via ORAL
  Filled 2020-10-29: qty 1

## 2020-10-29 MED ORDER — SODIUM CHLORIDE 0.9 % IV SOLN
200.0000 mg | Freq: Once | INTRAVENOUS | Status: AC
  Administered 2020-10-29: 200 mg via INTRAVENOUS
  Filled 2020-10-29: qty 40

## 2020-10-29 MED ORDER — LACTATED RINGERS IV SOLN: INTRAVENOUS | Status: AC

## 2020-10-29 MED ORDER — METHYLPREDNISOLONE SODIUM SUCC 40 MG IJ SOLR
40.0000 mg | Freq: Two times a day (BID) | INTRAMUSCULAR | Status: DC
  Administered 2020-10-29 – 2020-10-30 (×2): 40 mg via INTRAVENOUS
  Filled 2020-10-29 (×2): qty 1

## 2020-10-29 MED ORDER — SODIUM CHLORIDE 0.9 % IV SOLN
100.0000 mg | Freq: Every day | INTRAVENOUS | Status: DC
  Administered 2020-10-30 – 2020-11-01 (×3): 100 mg via INTRAVENOUS
  Filled 2020-10-29 (×3): qty 20

## 2020-10-29 NOTE — Progress Notes (Signed)
PROGRESS NOTE    April Combs  [id number] DOB: 08/03/1945 DOA: 10/27/2020 PCP: Binnie Rail, MD   Brief Narrative:  April Combs is a 75 y.o. female with medical history significant for HFpEF, HTN, HLD, COPD, hypothyroidism, obesity who presents for evaluation of shortness of breath.  She states that she tested positive for COVID-19 on October 21, 2020 and was treated with a course of Molnupivar which she completed the day prior to admission. Acutely worsening symptoms over the past 48h with notable hypoxia in the 80s at intake - admitted for oxygen supplementation and Paxlovid.  Assessment & Plan:  Acute hypoxic respiratory failure  COVID19 pneumonia Patient noted to be hypoxic at intake sats in the 80s on room air, improved with oxygen supplementation -Progressed despite 5-day course of molnupiravir -Will discontinue Paxlovid, start remdesivir and IV Solu-Medrol -Supportive care, early ambulation, incentive spirometry, flutter valve -Monitor inflammatory markers -Airborne precautions  Essential hypertension -Continue lisinopril HCTZ carvedilol   COPD (chronic obstructive pulmonary disease)  Cannot rule out concurrent exacerbation given above Continue home Trelegy, steroids as above   Chronic heart failure with preserved ejection fraction (HFpEF)  Continue coreg, lisinopril hct, lipitor    AKI Continue IV fluids as above, hold furosemide   Hypothyroidism Continue synthroid.     DVT prophylaxis:      Lovenox for DVT prophylaxis Code Status:                         Full Code  Family Communication:      None present  Status is: Inpatient  Dispo: The patient is from: Home              Anticipated d/c is to: Home              Anticipated d/c date is: >72h              Patient currently not medically stable for discharge  Consultants:  None  Procedures:  None  Antimicrobials:  None  Subjective: -Feels a little better, continues to have cough,  breathing is improving  Objective: Vitals:   10/29/20 0442 10/29/20 0753 10/29/20 0807 10/29/20 1220  BP: 133/69 (!) 151/82  (!) 141/75  Pulse: 77 83  75  Resp: '19 20  19  '$ Temp: 98.3 F (36.8 C) 98.1 F (36.7 C)  98.3 F (36.8 C)  TempSrc: Oral Oral  Oral  SpO2: 92% 92% 93% 94%  Weight:      Height:        Intake/Output Summary (Last 24 hours) at 10/29/2020 1323 Last data filed at 10/29/2020 [id number] Gross per 24 hour  Intake 2195.65 ml  Output --  Net 2195.65 ml   Filed Weights   10/29/20 0059  Weight: 108.9 kg    Examination:  General: Pleasant elderly female resting in bed, AAOx3, no distress HEENT: No JVD CVS: S1-S2, regular rate rhythm Lungs: Few scattered rhonchi Abdomen: Soft, nontender, bowel sounds present Extremities: No edema  Skin:  Warm and dry, no rashes on exposed skin   Data Reviewed: I have personally reviewed following labs and imaging studies  CBC: Recent Labs  Lab 10/27/20 1719 10/29/20 0107  WBC 9.8 11.2*  NEUTROABS 7.5 9.6*  HGB 11.4* 11.1*  HCT 35.0* 32.7*  MCV 98.0 95.9  PLT 237 [id number]   Basic Metabolic Panel: Recent Labs  Lab 10/27/20 1719 10/29/20 0107  NA 136 133*  K 3.5 3.8  CL 98 98  CO2 22 20*  GLUCOSE 133* 216*  BUN 26* 51*  CREATININE 1.19* 1.89*  CALCIUM 8.4* 8.0*   GFR: Estimated Creatinine Clearance: 32.1 mL/min (A) (by C-G formula based on SCr of 1.89 mg/dL (H)). Liver Function Tests: Recent Labs  Lab 10/27/20 1719 10/29/20 0107  AST 46* 57*  ALT 33 46*  ALKPHOS 81 73  BILITOT 1.2 0.9  PROT 7.0 6.5  ALBUMIN 3.1* 2.8*   No results for input(s): LIPASE, AMYLASE in the last 168 hours. No results for input(s): AMMONIA in the last 168 hours. Coagulation Profile: No results for input(s): INR, PROTIME in the last 168 hours. Cardiac Enzymes: No results for input(s): CKTOTAL, CKMB, CKMBINDEX, TROPONINI in the last 168 hours. BNP (last 3 results) No results for input(s): PROBNP in the last 8760  hours. HbA1C: No results for input(s): HGBA1C in the last 72 hours. CBG: No results for input(s): GLUCAP in the last 168 hours. Lipid Profile: No results for input(s): CHOL, HDL, LDLCALC, TRIG, CHOLHDL, LDLDIRECT in the last 72 hours. Thyroid Function Tests: No results for input(s): TSH, T4TOTAL, FREET4, T3FREE, THYROIDAB in the last 72 hours. Anemia Panel: No results for input(s): VITAMINB12, FOLATE, FERRITIN, TIBC, IRON, RETICCTPCT in the last 72 hours. Sepsis Labs: No results for input(s): PROCALCITON, LATICACIDVEN in the last 168 hours.  Recent Results (from the past 240 hour(s))  Resp Panel by RT-PCR (Flu A&B, Covid) Nasopharyngeal Swab     Status: Abnormal   Collection Time: 10/28/20  2:40 AM   Specimen: Nasopharyngeal Swab; Nasopharyngeal(NP) swabs in vial transport medium  Result Value Ref Range Status   SARS Coronavirus 2 by RT PCR POSITIVE (A) NEGATIVE Final    Comment: RESULT CALLED TO, READ BACK BY AND VERIFIED WITH: RN CRYSTAL BLACK 10/28/20'@03'$ :29 BY TW (NOTE) SARS-CoV-2 target nucleic acids are DETECTED.  The SARS-CoV-2 RNA is generally detectable in upper respiratory specimens during the acute phase of infection. Positive results are indicative of the presence of the identified virus, but do not rule out bacterial infection or co-infection with other pathogens not detected by the test. Clinical correlation with patient history and other diagnostic information is necessary to determine patient infection status. The expected result is Negative.  Fact Sheet for Patients: EntrepreneurPulse.com.au  Fact Sheet for Healthcare Providers: IncredibleEmployment.be  This test is not yet approved or cleared by the [place] FDA and  has been authorized for detection and/or diagnosis of SARS-CoV-2 by FDA under an Emergency Use Authorization (EUA).  This EUA will remain in effect (meaning this test can  be used) for the duration of  the  COVID-19 declaration under Section 564(b)(1) of the Act, 21 U.S.C. section 360bbb-3(b)(1), unless the authorization is terminated or revoked sooner.     Influenza A by PCR NEGATIVE NEGATIVE Final   Influenza B by PCR NEGATIVE NEGATIVE Final    Comment: (NOTE) The Xpert Xpress SARS-CoV-2/FLU/RSV plus assay is intended as an aid in the diagnosis of influenza from Nasopharyngeal swab specimens and should not be used as a sole basis for treatment. Nasal washings and aspirates are unacceptable for Xpert Xpress SARS-CoV-2/FLU/RSV testing.  Fact Sheet for Patients: EntrepreneurPulse.com.au  Fact Sheet for Healthcare Providers: IncredibleEmployment.be  This test is not yet approved or cleared by the [place] FDA and has been authorized for detection and/or diagnosis of SARS-CoV-2 by FDA under an Emergency Use Authorization (EUA). This EUA will remain in effect (meaning this test can be used) for the duration of the COVID-19 declaration under Section 564(b)(1) of  the Act, 21 U.S.C. section 360bbb-3(b)(1), unless the authorization is terminated or revoked.  Performed at [facility], [place] [address], [place],  [address]     Radiology Studies: DG Chest 2 View  Result Date: 10/27/2020 CLINICAL DATA:  Weakness and shortness of breath, history of COVID-19 positivity EXAM: CHEST - 2 VIEW COMPARISON:  04/11/2017 FINDINGS: Cardiac shadow is within normal limits. Aortic calcifications are seen. Lung bases demonstrate some patchy changes consistent with atelectasis and likely related to the recent COVID infection. No sizable effusion is noted. No bony abnormality is seen. IMPRESSION: Bibasilar airspace opacities as described likely related to the recent infection. Electronically Signed   By: Inez Catalina M.D.   On: 10/27/2020 18:04    Scheduled Meds:  vitamin C  500 mg Oral Daily   atorvastatin  20 mg Oral Daily   carvedilol  12.5 mg Oral BID  WC   enoxaparin (LOVENOX) injection  40 mg Subcutaneous Q24H   fluticasone furoate-vilanterol  1 puff Inhalation Daily   levothyroxine  50 mcg Oral Daily   methylPREDNISolone (SOLU-MEDROL) injection  40 mg Intravenous Q12H   umeclidinium bromide  1 puff Inhalation Daily   zinc sulfate  220 mg Oral Daily   Continuous Infusions:  lactated ringers       LOS: 2 days   Time spent: 35mn  PDomenic Polite MD [facility]   10/29/2020, 1:23 PM

## 2020-10-29 NOTE — Progress Notes (Signed)
80- patient arrived into room 5W19 from ED. Ambulated from stretcher to bed. VSS. No signs of acute distress. Bed locked and in lowest position, non-skid footwear placed on patient, call light and personal belongings within reach

## 2020-10-29 NOTE — Progress Notes (Addendum)
Nutrition Brief Note  Patient identified on the Malnutrition Screening Tool (MST) Report  Pt reports not eating for a few days PTA due to not feeling well. Pt seen at lunch, pt eating and reports appetite is better. She likes the food she has received.  She currently has O2 sats of 93% on room air.  She report no recent weight loss in the last 6 months. Usually appetite is good.   Wt Readings from Last 15 Encounters:  10/29/20 108.9 kg  09/29/20 108.8 kg  08/20/20 109.3 kg  02/19/20 112 kg  09/10/19 112 kg  08/20/19 112.9 kg  08/20/19 113 kg  07/04/19 114.2 kg  03/02/19 117 kg  02/19/19 117 kg  08/18/18 124.6 kg  08/10/18 125.2 kg  02/15/18 116.1 kg  01/04/18 113.9 kg  08/10/17 110 kg    Body mass index is 39.95 kg/m. Patient meets criteria for obesity  based on current BMI.   Current diet order is Heart Healthy, patient is consuming approximately 100% of meals at this time. Labs and medications reviewed.   No nutrition interventions warranted at this time. If nutrition issues arise, please consult RD.   Lockie Pares., RD, LDN, CNSC See AMiON for contact information

## 2020-10-30 LAB — CBC WITH DIFFERENTIAL/PLATELET
Abs Immature Granulocytes: 0.35 10*3/uL — ABNORMAL HIGH (ref 0.00–0.07)
Basophils Absolute: 0 10*3/uL (ref 0.0–0.1)
Basophils Relative: 0 %
Eosinophils Absolute: 0 10*3/uL (ref 0.0–0.5)
Eosinophils Relative: 0 %
HCT: 30.4 % — ABNORMAL LOW (ref 36.0–46.0)
Hemoglobin: 10.7 g/dL — ABNORMAL LOW (ref 12.0–15.0)
Immature Granulocytes: 2 %
Lymphocytes Relative: 5 %
Lymphs Abs: 0.8 10*3/uL (ref 0.7–4.0)
MCH: 32.8 pg (ref 26.0–34.0)
MCHC: 35.2 g/dL (ref 30.0–36.0)
MCV: 93.3 fL (ref 80.0–100.0)
Monocytes Absolute: 0.5 10*3/uL (ref 0.1–1.0)
Monocytes Relative: 3 %
Neutro Abs: 13.1 10*3/uL — ABNORMAL HIGH (ref 1.7–7.7)
Neutrophils Relative %: 90 %
Platelets: 270 10*3/uL (ref 150–400)
RBC: 3.26 MIL/uL — ABNORMAL LOW (ref 3.87–5.11)
RDW: 14.2 % (ref 11.5–15.5)
WBC: 14.7 10*3/uL — ABNORMAL HIGH (ref 4.0–10.5)
nRBC: 0 % (ref 0.0–0.2)

## 2020-10-30 LAB — COMPREHENSIVE METABOLIC PANEL
ALT: 38 U/L (ref 0–44)
AST: 37 U/L (ref 15–41)
Albumin: 2.6 g/dL — ABNORMAL LOW (ref 3.5–5.0)
Alkaline Phosphatase: 65 U/L (ref 38–126)
Anion gap: 14 (ref 5–15)
BUN: 46 mg/dL — ABNORMAL HIGH (ref 8–23)
CO2: 25 mmol/L (ref 22–32)
Calcium: 8 mg/dL — ABNORMAL LOW (ref 8.9–10.3)
Chloride: 97 mmol/L — ABNORMAL LOW (ref 98–111)
Creatinine, Ser: 1.51 mg/dL — ABNORMAL HIGH (ref 0.44–1.00)
GFR, Estimated: 36 mL/min — ABNORMAL LOW (ref >60)
Glucose, Bld: 235 mg/dL — ABNORMAL HIGH (ref 70–99)
Potassium: 3.4 mmol/L — ABNORMAL LOW (ref 3.5–5.1)
Sodium: 136 mmol/L (ref 135–145)
Total Bilirubin: 0.2 mg/dL — ABNORMAL LOW (ref 0.3–1.2)
Total Protein: 5.9 g/dL — ABNORMAL LOW (ref 6.5–8.1)

## 2020-10-30 LAB — C-REACTIVE PROTEIN: CRP: 11.1 mg/dL — ABNORMAL HIGH (ref <1.0)

## 2020-10-30 LAB — HEMOGLOBIN A1C
Hgb A1c MFr Bld: 6.6 % — ABNORMAL HIGH (ref 4.8–5.6)
Mean Plasma Glucose: 142.72 mg/dL

## 2020-10-30 LAB — GLUCOSE, CAPILLARY
Glucose-Capillary: 299 mg/dL — ABNORMAL HIGH (ref 70–99)
Glucose-Capillary: 288 mg/dL — ABNORMAL HIGH (ref 70–99)

## 2020-10-30 MED ORDER — DEXAMETHASONE 6 MG PO TABS
6.0000 mg | ORAL_TABLET | Freq: Every day | ORAL | Status: DC
  Administered 2020-10-31 – 2020-11-01 (×2): 6 mg via ORAL
  Filled 2020-10-30 (×2): qty 1

## 2020-10-30 MED ORDER — FUROSEMIDE 10 MG/ML IJ SOLN
20.0000 mg | Freq: Once | INTRAMUSCULAR | Status: AC
  Administered 2020-10-30: 20 mg via INTRAVENOUS
  Filled 2020-10-30: qty 2

## 2020-10-30 MED ORDER — METHYLPREDNISOLONE SODIUM SUCC 40 MG IJ SOLR
40.0000 mg | Freq: Two times a day (BID) | INTRAMUSCULAR | Status: AC
Start: 2020-10-30 — End: 2020-10-30
  Administered 2020-10-30: 40 mg via INTRAVENOUS
  Filled 2020-10-30: qty 1

## 2020-10-30 MED ORDER — INSULIN ASPART 100 UNIT/ML IJ SOLN
0.0000 [IU] | Freq: Three times a day (TID) | INTRAMUSCULAR | Status: DC
  Administered 2020-10-31: 5 [IU] via SUBCUTANEOUS
  Administered 2020-10-31: 3 [IU] via SUBCUTANEOUS
  Administered 2020-10-31: 8 [IU] via SUBCUTANEOUS
  Administered 2020-11-01 (×2): 2 [IU] via SUBCUTANEOUS

## 2020-10-30 NOTE — Progress Notes (Addendum)
PROGRESS NOTE    Zienna Inboden  [id number] DOB: [birth year] DOA: 10/27/2020 PCP: Binnie Rail, MD   Brief Narrative:  April Combs is a 75 y.o. female with medical history significant for HFpEF, HTN, HLD, COPD, hypothyroidism, obesity who presents for evaluation of shortness of breath.  She states that she tested positive for COVID-19 on October 21, 2020 and was treated with a course of Molnupivar which she completed the day prior to admission. Acutely worsening symptoms over the past 48h with notable hypoxia in the 80s at intake - admitted for oxygen supplementation and Paxlovid.  Assessment & Plan:  Acute hypoxic respiratory failure  COVID19 pneumonia Patient noted to be hypoxic at intake sats in the 80s on room air, improved with oxygen supplementation -Progressed despite 5-day course of molnupiravir -Discontinued Paxlovid, start remdesivir yesterday and IV Solu-Medrol -Continue supportive care, early ambulation, incentive spirometry  -Inflammatory markers are improving  -Continue airborne precautions   Essential hypertension -Continue lisinopril HCTZ carvedilol   COPD (chronic obstructive pulmonary disease)  Cannot rule out concurrent exacerbation given above Continue home Trelegy, steroids as above   Acute chronic diastolic CHF  -IV Lasix x1 today  -continue coreg,  -lisinopril and HCTZ are on hold   AKI -Discontinued IV fluids, Lasix x1   Hypothyroidism Continue synthroid.   Hyperglycemia -Likely from steroids, add sliding scale insulin,    DVT prophylaxis:      Lovenox for DVT prophylaxis Code Status:                         Full Code  Family Communication:      None present  Status is: Inpatient  Dispo: The patient is from: Home              Anticipated d/c is to: Home              Anticipated d/c date is: >72h              Patient currently not medically stable for discharge  Consultants:  None  Procedures:  None  Antimicrobials:   None  Subjective: -Feels better overall, some cough and congestion, dyspnea with activity  Objective: Vitals:   10/29/20 1613 10/29/20 2036 10/30/20 0400 10/30/20 0747  BP: (!) 141/69 (!) 161/87 (!) 148/67 137/84  Pulse: 79 78 80 88  Resp: 19 19  (!) 22  Temp: 98.2 F (36.8 C) 98.8 F (37.1 C) 97.9 F (36.6 C) 97.7 F (36.5 C)  TempSrc: Oral Oral Oral Oral  SpO2: 93% 94% 94% 93%  Weight:      Height:        Intake/Output Summary (Last 24 hours) at 10/30/2020 1440 Last data filed at 10/29/2020 1712 Gross per 24 hour  Intake 325 ml  Output --  Net 325 ml   Filed Weights   10/29/20 0059  Weight: 108.9 kg    Examination:  General: Pleasant elderly female sitting up in the recliner, AAOx3, nondistressed HEENT: No JVD CVS: S1-S2, regular rate rhythm Lungs: Few scattered rhonchi and basilar Rales Abdomen: Soft, nontender, bowel sounds present extremities: No edema  Skin:  Warm and dry, no rashes on exposed skin   Data Reviewed: I have personally reviewed following labs and imaging studies  CBC: Recent Labs  Lab 10/27/20 1719 10/29/20 0107 10/30/20 0406  WBC 9.8 11.2* 14.7*  NEUTROABS 7.5 9.6* 13.1*  HGB 11.4* 11.1* 10.7*  HCT 35.0* 32.7* 30.4*  MCV 98.0 95.9  93.3  PLT 237 269 [id number]   Basic Metabolic Panel: Recent Labs  Lab 10/27/20 1719 10/29/20 0107 10/30/20 0406  NA 136 133* 136  K 3.5 3.8 3.4*  CL 98 98 97*  CO2 22 20* 25  GLUCOSE 133* 216* 235*  BUN 26* 51* 46*  CREATININE 1.19* 1.89* 1.51*  CALCIUM 8.4* 8.0* 8.0*   GFR: Estimated Creatinine Clearance: 40.1 mL/min (A) (by C-G formula based on SCr of 1.51 mg/dL (H)). Liver Function Tests: Recent Labs  Lab 10/27/20 1719 10/29/20 0107 10/30/20 0406  AST 46* 57* 37  ALT 33 46* 38  ALKPHOS 81 73 65  BILITOT 1.2 0.9 0.2*  PROT 7.0 6.5 5.9*  ALBUMIN 3.1* 2.8* 2.6*   No results for input(s): LIPASE, AMYLASE in the last 168 hours. No results for input(s): AMMONIA in the last 168  hours. Coagulation Profile: No results for input(s): INR, PROTIME in the last 168 hours. Cardiac Enzymes: No results for input(s): CKTOTAL, CKMB, CKMBINDEX, TROPONINI in the last 168 hours. BNP (last 3 results) No results for input(s): PROBNP in the last 8760 hours. HbA1C: No results for input(s): HGBA1C in the last 72 hours. CBG: No results for input(s): GLUCAP in the last 168 hours. Lipid Profile: No results for input(s): CHOL, HDL, LDLCALC, TRIG, CHOLHDL, LDLDIRECT in the last 72 hours. Thyroid Function Tests: No results for input(s): TSH, T4TOTAL, FREET4, T3FREE, THYROIDAB in the last 72 hours. Anemia Panel: No results for input(s): VITAMINB12, FOLATE, FERRITIN, TIBC, IRON, RETICCTPCT in the last 72 hours. Sepsis Labs: No results for input(s): PROCALCITON, LATICACIDVEN in the last 168 hours.  Recent Results (from the past 240 hour(s))  Resp Panel by RT-PCR (Flu A&B, Covid) Nasopharyngeal Swab     Status: Abnormal   Collection Time: 10/28/20  2:40 AM   Specimen: Nasopharyngeal Swab; Nasopharyngeal(NP) swabs in vial transport medium  Result Value Ref Range Status   SARS Coronavirus 2 by RT PCR POSITIVE (A) NEGATIVE Final    Comment: RESULT CALLED TO, READ BACK BY AND VERIFIED WITH: RN CRYSTAL BLACK 10/28/20'@03'$ :29 BY TW (NOTE) SARS-CoV-2 target nucleic acids are DETECTED.  The SARS-CoV-2 RNA is generally detectable in upper respiratory specimens during the acute phase of infection. Positive results are indicative of the presence of the identified virus, but do not rule out bacterial infection or co-infection with other pathogens not detected by the test. Clinical correlation with patient history and other diagnostic information is necessary to determine patient infection status. The expected result is Negative.  Fact Sheet for Patients: EntrepreneurPulse.com.au  Fact Sheet for Healthcare Providers: IncredibleEmployment.be  This test is  not yet approved or cleared by the [place] FDA and  has been authorized for detection and/or diagnosis of SARS-CoV-2 by FDA under an Emergency Use Authorization (EUA).  This EUA will remain in effect (meaning this test can  be used) for the duration of  the COVID-19 declaration under Section 564(b)(1) of the Act, 21 U.S.C. section 360bbb-3(b)(1), unless the authorization is terminated or revoked sooner.     Influenza A by PCR NEGATIVE NEGATIVE Final   Influenza B by PCR NEGATIVE NEGATIVE Final    Comment: (NOTE) The Xpert Xpress SARS-CoV-2/FLU/RSV plus assay is intended as an aid in the diagnosis of influenza from Nasopharyngeal swab specimens and should not be used as a sole basis for treatment. Nasal washings and aspirates are unacceptable for Xpert Xpress SARS-CoV-2/FLU/RSV testing.  Fact Sheet for Patients: EntrepreneurPulse.com.au  Fact Sheet for Healthcare Providers: IncredibleEmployment.be  This test is not  yet approved or cleared by the [place] and has been authorized for detection and/or diagnosis of SARS-CoV-2 by FDA under an Emergency Use Authorization (EUA). This EUA will remain in effect (meaning this test can be used) for the duration of the COVID-19 declaration under Section 564(b)(1) of the Act, 21 U.S.C. section 360bbb-3(b)(1), unless the authorization is terminated or revoked.  Performed at [facility], [place] [address], [place], [place] [address]     Radiology Studies: No results found.  Scheduled Meds:  vitamin C  500 mg Oral Daily   atorvastatin  20 mg Oral Daily   carvedilol  12.5 mg Oral BID WC   enoxaparin (LOVENOX) injection  40 mg Subcutaneous Q24H   fluticasone furoate-vilanterol  1 puff Inhalation Daily   levothyroxine  50 mcg Oral Daily   methylPREDNISolone (SOLU-MEDROL) injection  40 mg Intravenous Q12H   umeclidinium bromide  1 puff Inhalation Daily   zinc sulfate  220 mg Oral Daily    Continuous Infusions:  remdesivir 100 mg in NS 100 mL 100 mg (10/30/20 0905)     LOS: 3 days   Time spent: 15mn  PDomenic Polite MD [facility]   10/30/2020, 2:40 PM

## 2020-10-31 LAB — COMPREHENSIVE METABOLIC PANEL
ALT: 32 U/L (ref 0–44)
AST: 28 U/L (ref 15–41)
Albumin: 2.8 g/dL — ABNORMAL LOW (ref 3.5–5.0)
Alkaline Phosphatase: 71 U/L (ref 38–126)
Anion gap: 10 (ref 5–15)
BUN: 42 mg/dL — ABNORMAL HIGH (ref 8–23)
CO2: 28 mmol/L (ref 22–32)
Calcium: 7.5 mg/dL — ABNORMAL LOW (ref 8.9–10.3)
Chloride: 99 mmol/L (ref 98–111)
Creatinine, Ser: 1.2 mg/dL — ABNORMAL HIGH (ref 0.44–1.00)
GFR, Estimated: 48 mL/min — ABNORMAL LOW (ref >60)
Glucose, Bld: 263 mg/dL — ABNORMAL HIGH (ref 70–99)
Potassium: 3.1 mmol/L — ABNORMAL LOW (ref 3.5–5.1)
Sodium: 137 mmol/L (ref 135–145)
Total Bilirubin: <0.1 mg/dL — ABNORMAL LOW (ref 0.3–1.2)
Total Protein: 5.9 g/dL — ABNORMAL LOW (ref 6.5–8.1)

## 2020-10-31 LAB — CBC WITH DIFFERENTIAL/PLATELET
Abs Immature Granulocytes: 0.71 10*3/uL — ABNORMAL HIGH (ref 0.00–0.07)
Basophils Absolute: 0.1 10*3/uL (ref 0.0–0.1)
Basophils Relative: 0 %
Eosinophils Absolute: 0 10*3/uL (ref 0.0–0.5)
Eosinophils Relative: 0 %
HCT: 32.3 % — ABNORMAL LOW (ref 36.0–46.0)
Hemoglobin: 11 g/dL — ABNORMAL LOW (ref 12.0–15.0)
Immature Granulocytes: 5 %
Lymphocytes Relative: 5 %
Lymphs Abs: 0.7 10*3/uL (ref 0.7–4.0)
MCH: 32.4 pg (ref 26.0–34.0)
MCHC: 34.1 g/dL (ref 30.0–36.0)
MCV: 95 fL (ref 80.0–100.0)
Monocytes Absolute: 0.6 10*3/uL (ref 0.1–1.0)
Monocytes Relative: 4 %
Neutro Abs: 12.2 10*3/uL — ABNORMAL HIGH (ref 1.7–7.7)
Neutrophils Relative %: 86 %
Platelets: 303 10*3/uL (ref 150–400)
RBC: 3.4 MIL/uL — ABNORMAL LOW (ref 3.87–5.11)
RDW: 14.4 % (ref 11.5–15.5)
WBC: 14.2 10*3/uL — ABNORMAL HIGH (ref 4.0–10.5)
nRBC: 0.1 % (ref 0.0–0.2)

## 2020-10-31 LAB — C-REACTIVE PROTEIN: CRP: 7.8 mg/dL — ABNORMAL HIGH (ref <1.0)

## 2020-10-31 LAB — GLUCOSE, CAPILLARY
Glucose-Capillary: 270 mg/dL — ABNORMAL HIGH (ref 70–99)
Glucose-Capillary: 235 mg/dL — ABNORMAL HIGH (ref 70–99)
Glucose-Capillary: 170 mg/dL — ABNORMAL HIGH (ref 70–99)
Glucose-Capillary: 234 mg/dL — ABNORMAL HIGH (ref 70–99)

## 2020-10-31 MED ORDER — POTASSIUM CHLORIDE CRYS ER 20 MEQ PO TBCR
40.0000 meq | EXTENDED_RELEASE_TABLET | Freq: Two times a day (BID) | ORAL | Status: AC
  Administered 2020-10-31: 40 meq via ORAL
  Filled 2020-10-31: qty 2

## 2020-10-31 MED ORDER — MENTHOL 3 MG MT LOZG
1.0000 | LOZENGE | OROMUCOSAL | Status: DC | PRN
  Filled 2020-10-31: qty 9

## 2020-10-31 MED ORDER — POTASSIUM CHLORIDE CRYS ER 20 MEQ PO TBCR
40.0000 meq | EXTENDED_RELEASE_TABLET | Freq: Once | ORAL | Status: AC
  Administered 2020-10-31: 40 meq via ORAL
  Filled 2020-10-31: qty 2

## 2020-10-31 MED ORDER — FUROSEMIDE 10 MG/ML IJ SOLN
20.0000 mg | Freq: Once | INTRAMUSCULAR | Status: AC
  Administered 2020-10-31: 20 mg via INTRAVENOUS
  Filled 2020-10-31: qty 2

## 2020-10-31 MED ORDER — POTASSIUM CHLORIDE CRYS ER 20 MEQ PO TBCR
40.0000 meq | EXTENDED_RELEASE_TABLET | Freq: Every day | ORAL | Status: DC
  Administered 2020-10-31: 40 meq via ORAL
  Filled 2020-10-31: qty 2

## 2020-10-31 NOTE — Plan of Care (Signed)

## 2020-10-31 NOTE — Progress Notes (Signed)
PROGRESS NOTE    April Combs  [id number] DOB: 1945/09/07 DOA: 10/27/2020 PCP: Binnie Rail, MD  Brief Narrative:  April Combs is a 75 y.o. female with medical history significant for HFpEF, HTN, HLD, COPD, hypothyroidism, obesity who presents for evaluation of shortness of breath.  She states that she tested positive for COVID-19 on October 21, 2020 and was treated with a course of Molnupivar which she completed the day prior to admission. Acutely worsening symptoms over the past 48h with notable hypoxia in the 80s at intake - admitted for oxygen supplementation and Paxlovid.  Assessment & Plan:  Acute hypoxic respiratory failure  COVID19 pneumonia Patient noted to be hypoxic at intake sats in the 80s on room air, improved with oxygen supplementation -Progressed despite 5-day course of molnupiravir -Discontinued Paxlovid, start remdesivir 8/17 and IV Solu-Medrol, now on oral Decadron -Continue supportive care, early ambulation, incentive spirometry  -Inflammatory markers are improving  -Continue airborne precautions   Essential hypertension -Continue lisinopril HCTZ carvedilol   COPD (chronic obstructive pulmonary disease)  Cannot rule out concurrent exacerbation given above Continue home Trelegy, steroids as above   Acute chronic diastolic CHF  -Repeat IV Lasix today -continue coreg,  -lisinopril and HCTZ are on hold   AKI -Improving, lisinopril on hold   Hypothyroidism Continue synthroid.   Hyperglycemia Borderline diabetes -Hemoglobin A1c is 6.6, technically consistent with diabetes however in the setting of high-dose steroids this is difficult to differentiate, recommend rechecking A1c in 2 to 3 months    DVT prophylaxis:      Lovenox for DVT prophylaxis Code Status:                         Full Code  Family Communication:      None present  Status is: Inpatient  Dispo: The patient is from: Home              Anticipated d/c is to: Home               Anticipated d/c date is: Possibly tomorrow              Patient currently not medically stable for discharge  Consultants:  None  Procedures:  None  Antimicrobials:  None  Subjective: -Feels better overall, some cough and congestion, dyspnea with activity  Objective: Vitals:   10/30/20 2012 10/30/20 2300 10/31/20 0440 10/31/20 1235  BP: (!) 128/53 (!) 163/86 (!) 145/84 (!) 148/76  Pulse: 87 72 66 67  Resp: 12 17 (!) 25 (!) 25  Temp: 98.8 F (37.1 C) 98.6 F (37 C) 98 F (36.7 C) 98.1 F (36.7 C)  TempSrc: Oral Oral Oral Oral  SpO2: 92% 92% 92% 94%  Weight:      Height:        Intake/Output Summary (Last 24 hours) at 10/31/2020 1359 Last data filed at 10/31/2020 0500 Gross per 24 hour  Intake 480 ml  Output --  Net 480 ml   Filed Weights   10/29/20 0059  Weight: 108.9 kg    Examination:  General: Pleasant elderly female sitting up in the recliner, AAOx3, nondistressed HEENT: No JVD CVS: S1-S2, regular rate rhythm Lungs: Few scattered rhonchi and basilar Rales Abdomen: Soft, nontender, bowel sounds present extremities: No edema  Skin:  Warm and dry, no rashes on exposed skin   Data Reviewed: I have personally reviewed following labs and imaging studies  CBC: Recent Labs  Lab 10/27/20 1719 10/29/20  0107 10/30/20 0406 10/31/20 0100  WBC 9.8 11.2* 14.7* 14.2*  NEUTROABS 7.5 9.6* 13.1* 12.2*  HGB 11.4* 11.1* 10.7* 11.0*  HCT 35.0* 32.7* 30.4* 32.3*  MCV 98.0 95.9 93.3 95.0  PLT 237 269 270 [id number]   Basic Metabolic Panel: Recent Labs  Lab 10/27/20 1719 10/29/20 0107 10/30/20 0406 10/31/20 0100  NA 136 133* 136 137  K 3.5 3.8 3.4* 3.1*  CL 98 98 97* 99  CO2 22 20* 25 28  GLUCOSE 133* 216* 235* 263*  BUN 26* 51* 46* 42*  CREATININE 1.19* 1.89* 1.51* 1.20*  CALCIUM 8.4* 8.0* 8.0* 7.5*   GFR: Estimated Creatinine Clearance: 50.5 mL/min (A) (by C-G formula based on SCr of 1.2 mg/dL (H)). Liver Function Tests: Recent Labs  Lab 10/27/20 1719  10/29/20 0107 10/30/20 0406 10/31/20 0100  AST 46* 57* 37 28  ALT 33 46* 38 32  ALKPHOS 81 73 65 71  BILITOT 1.2 0.9 0.2* <0.1*  PROT 7.0 6.5 5.9* 5.9*  ALBUMIN 3.1* 2.8* 2.6* 2.8*   No results for input(s): LIPASE, AMYLASE in the last 168 hours. No results for input(s): AMMONIA in the last 168 hours. Coagulation Profile: No results for input(s): INR, PROTIME in the last 168 hours. Cardiac Enzymes: No results for input(s): CKTOTAL, CKMB, CKMBINDEX, TROPONINI in the last 168 hours. BNP (last 3 results) No results for input(s): PROBNP in the last 8760 hours. HbA1C: Recent Labs    10/30/20 0406  HGBA1C 6.6*   CBG: Recent Labs  Lab 10/30/20 1736 10/30/20 2116 10/31/20 0830 10/31/20 1204  GLUCAP 299* 288* 270* 235*   Lipid Profile: No results for input(s): CHOL, HDL, LDLCALC, TRIG, CHOLHDL, LDLDIRECT in the last 72 hours. Thyroid Function Tests: No results for input(s): TSH, T4TOTAL, FREET4, T3FREE, THYROIDAB in the last 72 hours. Anemia Panel: No results for input(s): VITAMINB12, FOLATE, FERRITIN, TIBC, IRON, RETICCTPCT in the last 72 hours. Sepsis Labs: No results for input(s): PROCALCITON, LATICACIDVEN in the last 168 hours.  Recent Results (from the past 240 hour(s))  Resp Panel by RT-PCR (Flu A&B, Covid) Nasopharyngeal Swab     Status: Abnormal   Collection Time: 10/28/20  2:40 AM   Specimen: Nasopharyngeal Swab; Nasopharyngeal(NP) swabs in vial transport medium  Result Value Ref Range Status   SARS Coronavirus 2 by RT PCR POSITIVE (A) NEGATIVE Final    Comment: RESULT CALLED TO, READ BACK BY AND VERIFIED WITH: RN CRYSTAL BLACK 10/28/20'@03'$ :29 BY TW (NOTE) SARS-CoV-2 target nucleic acids are DETECTED.  The SARS-CoV-2 RNA is generally detectable in upper respiratory specimens during the acute phase of infection. Positive results are indicative of the presence of the identified virus, but do not rule out bacterial infection or co-infection with other pathogens  not detected by the test. Clinical correlation with patient history and other diagnostic information is necessary to determine patient infection status. The expected result is Negative.  Fact Sheet for Patients: EntrepreneurPulse.com.au  Fact Sheet for Healthcare Providers: IncredibleEmployment.be  This test is not yet approved or cleared by the [place] FDA and  has been authorized for detection and/or diagnosis of SARS-CoV-2 by FDA under an Emergency Use Authorization (EUA).  This EUA will remain in effect (meaning this test can  be used) for the duration of  the COVID-19 declaration under Section 564(b)(1) of the Act, 21 U.S.C. section 360bbb-3(b)(1), unless the authorization is terminated or revoked sooner.     Influenza A by PCR NEGATIVE NEGATIVE Final   Influenza B by PCR NEGATIVE NEGATIVE Final  Comment: (NOTE) The Xpert Xpress SARS-CoV-2/FLU/RSV plus assay is intended as an aid in the diagnosis of influenza from Nasopharyngeal swab specimens and should not be used as a sole basis for treatment. Nasal washings and aspirates are unacceptable for Xpert Xpress SARS-CoV-2/FLU/RSV testing.  Fact Sheet for Patients: EntrepreneurPulse.com.au  Fact Sheet for Healthcare Providers: IncredibleEmployment.be  This test is not yet approved or cleared by the [place] FDA and has been authorized for detection and/or diagnosis of SARS-CoV-2 by FDA under an Emergency Use Authorization (EUA). This EUA will remain in effect (meaning this test can be used) for the duration of the COVID-19 declaration under Section 564(b)(1) of the Act, 21 U.S.C. section 360bbb-3(b)(1), unless the authorization is terminated or revoked.  Performed at [facility], [place] [address] [place], [place] [address]     Radiology Studies: No results found.  Scheduled Meds:  vitamin C  500 mg Oral Daily   atorvastatin   20 mg Oral Daily   carvedilol  12.5 mg Oral BID WC   dexamethasone  6 mg Oral Daily   enoxaparin (LOVENOX) injection  40 mg Subcutaneous Q24H   fluticasone furoate-vilanterol  1 puff Inhalation Daily   insulin aspart  0-15 Units Subcutaneous TID WC   levothyroxine  50 mcg Oral Daily   potassium chloride  40 mEq Oral Daily   umeclidinium bromide  1 puff Inhalation Daily   zinc sulfate  220 mg Oral Daily   Continuous Infusions:  remdesivir 100 mg in NS 100 mL 100 mg (10/31/20 0955)     LOS: 4 days   Time spent: 29mn  PDomenic Polite MD [facility]   10/31/2020, 1:59 PM

## 2020-11-01 DIAGNOSIS — J1282 Pneumonia due to coronavirus disease 2019: Secondary | ICD-10-CM

## 2020-11-01 DIAGNOSIS — U071 COVID-19: Principal | ICD-10-CM

## 2020-11-01 LAB — GLUCOSE, CAPILLARY
Glucose-Capillary: 136 mg/dL — ABNORMAL HIGH (ref 70–99)
Glucose-Capillary: 130 mg/dL — ABNORMAL HIGH (ref 70–99)

## 2020-11-01 LAB — COMPREHENSIVE METABOLIC PANEL
ALT: 27 U/L (ref 0–44)
AST: 23 U/L (ref 15–41)
Albumin: 2.6 g/dL — ABNORMAL LOW (ref 3.5–5.0)
Alkaline Phosphatase: 62 U/L (ref 38–126)
Anion gap: 11 (ref 5–15)
BUN: 38 mg/dL — ABNORMAL HIGH (ref 8–23)
CO2: 26 mmol/L (ref 22–32)
Calcium: 7 mg/dL — ABNORMAL LOW (ref 8.9–10.3)
Chloride: 98 mmol/L (ref 98–111)
Creatinine, Ser: 1.3 mg/dL — ABNORMAL HIGH (ref 0.44–1.00)
GFR, Estimated: 43 mL/min — ABNORMAL LOW (ref >60)
Glucose, Bld: 219 mg/dL — ABNORMAL HIGH (ref 70–99)
Potassium: 3.7 mmol/L (ref 3.5–5.1)
Sodium: 135 mmol/L (ref 135–145)
Total Bilirubin: 0.6 mg/dL (ref 0.3–1.2)
Total Protein: 5.5 g/dL — ABNORMAL LOW (ref 6.5–8.1)

## 2020-11-01 LAB — CBC WITH DIFFERENTIAL/PLATELET
Abs Immature Granulocytes: 0.94 10*3/uL — ABNORMAL HIGH (ref 0.00–0.07)
Basophils Absolute: 0.1 10*3/uL (ref 0.0–0.1)
Basophils Relative: 1 %
Eosinophils Absolute: 0 10*3/uL (ref 0.0–0.5)
Eosinophils Relative: 0 %
HCT: 31.7 % — ABNORMAL LOW (ref 36.0–46.0)
Hemoglobin: 10.8 g/dL — ABNORMAL LOW (ref 12.0–15.0)
Immature Granulocytes: 6 %
Lymphocytes Relative: 7 %
Lymphs Abs: 1 10*3/uL (ref 0.7–4.0)
MCH: 32.2 pg (ref 26.0–34.0)
MCHC: 34.1 g/dL (ref 30.0–36.0)
MCV: 94.6 fL (ref 80.0–100.0)
Monocytes Absolute: 0.9 10*3/uL (ref 0.1–1.0)
Monocytes Relative: 6 %
Neutro Abs: 12.6 10*3/uL — ABNORMAL HIGH (ref 1.7–7.7)
Neutrophils Relative %: 80 %
Platelets: 274 10*3/uL (ref 150–400)
RBC: 3.35 MIL/uL — ABNORMAL LOW (ref 3.87–5.11)
RDW: 14.5 % (ref 11.5–15.5)
WBC: 15.2 10*3/uL — ABNORMAL HIGH (ref 4.0–10.5)
nRBC: 0.2 % (ref 0.0–0.2)

## 2020-11-01 LAB — C-REACTIVE PROTEIN: CRP: 4.3 mg/dL — ABNORMAL HIGH (ref <1.0)

## 2020-11-01 MED ORDER — HYDRALAZINE HCL 25 MG PO TABS
25.0000 mg | ORAL_TABLET | Freq: Four times a day (QID) | ORAL | Status: DC | PRN
  Administered 2020-11-01: 25 mg via ORAL
  Filled 2020-11-01: qty 1

## 2020-11-01 MED ORDER — ACETAMINOPHEN 325 MG PO TABS: 650.0000 mg | ORAL_TABLET | Freq: Four times a day (QID) | ORAL | Status: DC | PRN

## 2020-11-01 MED ORDER — DEXAMETHASONE 4 MG PO TABS: 4.0000 mg | ORAL_TABLET | Freq: Every day | ORAL | 0 refills | Status: AC

## 2020-11-01 NOTE — Progress Notes (Signed)
[facility] night shift MedSurg coverage note.  The nursing staff reported that the patient's systolic blood pressure has been consistently elevated over 170 mmHg on multiple measurements.  Hydralazine 25 mg p.o. every 6 hours as needed for SBP over 149 mmHg ordered.  Tennis Must, MD.

## 2020-11-01 NOTE — Plan of Care (Signed)

## 2020-11-01 NOTE — Discharge Summary (Signed)
Physician Discharge Summary  April Combs [id number] DOB: [birth year] DOA: 10/27/2020  PCP: Binnie Rail, MD  Admit date: 10/27/2020 Discharge date: 11/01/2020  Time spent: 61mnutes  Recommendations for Outpatient Follow-up:  PCP in 1 to 2 weeks, please check hemoglobin A1c in 2 months   Discharge Diagnoses:  Principal Problem:   Pneumonia due to COVID-19 virus Active Problems:   Essential hypertension   Hypothyroidism   COPD (ch[place] disease[place]   Chronic heart failure with preserved ejection fraction (HFpEF) ([place] [place]   Dyspnea Borderline diabetes  Discharge Condition: Stable  Diet recommendation: Carb modified  Filed Weights   10/29/20 0059 11/01/20 0427 11/01/20 0439  Weight: 108.9 kg 111.8 kg 111.8 kg    History of present illness:  BBradley Voorheisis a 75y.o. female with medical history significant for HFpEF, HTN, HLD, COPD, hypothyroidism, obesity who presents for evaluation of shortness of breath.  She states that she tested positive for COVID-19 on October 21, 2020 and was treated with a course of Molnupivar which she completed the day prior to admission. Acutely worsening symptoms over the past 48h with notable hypoxia in the 80s at intake - admitted for oxygen supplementation and Paxlovid.  Hospital Course:   Acute hypoxic respiratory failure  COVID19 pneumonia Patient noted to be hypoxic at intake sats in the 80s on room air, improved with oxygen supplementation -Progressed despite 5-day course of molnupiravir prior to admission -Treated with IV remdesivir and steroids, clinically improved wean off oxygen inflammatory markers are improving as well -Discharged home on oral Decadron for 3 more days   Essential hypertension -Continue lisinopril HCTZ carvedilol   COPD (chronic obstructive pulmonary disease)  Cannot rule out concurrent exacerbation given above Continue home Trelegy, steroids as above   Acute chronic diastolic CHF   -Given a dose of IV Lasix this admission -continue coreg,  -lisinopril and HCTZ resumed at discharge   AKI -Improving, lisinopril resumed at discharge   Hypothyroidism Continue synthroid.    Hyperglycemia Borderline diabetes -Hemoglobin A1c is 6.6, technically consistent with diabetes however in the setting of high-dose steroids this is difficult to differentiate, recommend rechecking A1c in 2 to 3 months    Discharge Exam: Vitals:   11/01/20 0803 11/01/20 1200  BP: (!) 159/80 135/74  Pulse: 85 75  Resp: 18 16  Temp: 98 F (36.7 C) 97.9 F (36.6 C)  SpO2: 93% 94%    General: AAOx3 Cardiovascular: S1-S2, regular rate rhythm Respiratory: Few scattered rhonchi, improving  Discharge Instructions   Discharge Instructions     Diet - low sodium heart healthy   Complete by: As directed    Diet Carb Modified   Complete by: As directed    Increase activity slowly   Complete by: As directed       Allergies as of 11/01/2020   No Known Allergies      Medication List     STOP taking these medications    Molnupiravir 200 MG Caps       TAKE these medications    acetaminophen 325 MG tablet Commonly known as: TYLENOL Take 2 tablets (650 mg total) by mouth every 6 (six) hours as needed for mild pain (or Fever >/= 101).   albuterol 108 (90 Base) MCG/ACT inhaler Commonly known as: VENTOLIN HFA Inhale 2 puffs into the lungs every 6 (six) hours as needed for wheezing or shortness of breath.   allopurinol 300 MG tablet Commonly known as: ZYLOPRIM Take 1 tablet (300 mg  total) by mouth daily.   atorvastatin 20 MG tablet Commonly known as: LIPITOR Take 1 tablet (20 mg total) by mouth daily.   carvedilol 12.5 MG tablet Commonly known as: COREG TAKE 1 AND 1/2 TABLETS BY  MOUTH TWICE DAILY WITH A  MEAL What changed: See the new instructions.   dexamethasone 4 MG tablet Commonly known as: DECADRON Take 1 tablet (4 mg total) by mouth daily for 3 days. Start  taking on: November 02, 2020   furosemide 20 MG tablet Commonly known as: LASIX Take 1 tablet (20 mg total) by mouth daily as needed. What changed: reasons to take this   gabapentin 100 MG capsule Commonly known as: NEURONTIN TAKE 1 CAPSULE BY MOUTH IN  THE MORNING AND 2 CAPSULES  BY MOUTH AT BEDTIME   levothyroxine 50 MCG tablet Commonly known as: SYNTHROID Take 1 tablet by mouth once daily   lisinopril-hydrochlorothiazide 20-25 MG tablet Commonly known as: ZESTORETIC TAKE 1 TABLET BY MOUTH  DAILY   naproxen sodium 220 MG tablet Commonly known as: ALEVE Take 220 mg by mouth daily as needed (pain).   omeprazole 20 MG capsule Commonly known as: PRILOSEC Take 1 capsule (20 mg total) by mouth 2 (two) times daily before a meal.   Trelegy Ellipta 100-62.5-25 MCG/INH Aepb Generic drug: Fluticasone-Umeclidin-Vilant Inhale 1 puff into the lungs daily as needed (shortness of breath).       No Known Allergies  Follow-up Information     Binnie Rail, MD. Schedule an appointment as soon as possible for a visit in 1 week(s).   Specialty: Internal Medicine Contact information: [place] [address] [phone]                  The results of significant diagnostics from this hospitalization (including imaging, microbiology, ancillary and laboratory) are listed below for reference.    Significant Diagnostic Studies: DG Chest 2 View  Result Date: 10/27/2020 CLINICAL DATA:  Weakness and shortness of breath, history of COVID-19 positivity EXAM: CHEST - 2 VIEW COMPARISON:  04/11/2017 FINDINGS: Cardiac shadow is within normal limits. Aortic calcifications are seen. Lung bases demonstrate some patchy changes consistent with atelectasis and likely related to the recent COVID infection. No sizable effusion is noted. No bony abnormality is seen. IMPRESSION: Bibasilar airspace opacities as described likely related to the recent infection. Electronically Signed    By: Inez Catalina M.D.   On: 10/27/2020 18:04    Microbiology: Recent Results (from the past 240 hour(s))  Resp Panel by RT-PCR (Flu A&B, Covid) Nasopharyngeal Swab     Status: Abnormal   Collection Time: 10/28/20  2:40 AM   Specimen: Nasopharyngeal Swab; Nasopharyngeal(NP) swabs in vial transport medium  Result Value Ref Range Status   SARS Coronavirus 2 by RT PCR POSITIVE (A) NEGATIVE Final    Comment: RESULT CALLED TO, READ BACK BY AND VERIFIED WITH: RN CRYSTAL BLACK 10/28/20'@03'$ :29 BY TW (NOTE) SARS-CoV-2 target nucleic acids are DETECTED.  The SARS-CoV-2 RNA is generally detectable in upper respiratory specimens during the acute phase of infection. Positive results are indicative of the presence of the identified virus, but do not rule out bacterial infection or co-infection with other pathogens not detected by the test. Clinical correlation with patient history and other diagnostic information is necessary to determine patient infection status. The expected result is Negative.  Fact Sheet for Patients: EntrepreneurPulse.com.au  Fact Sheet for Healthcare Providers: IncredibleEmployment.be  This test is not yet approved or cleared by the  [place] Architectural technologist and  has been authorized for detection and/or diagnosis of SARS-CoV-2 by FDA under an Print production planner (EUA).  This EUA will remain in effect (meaning this test can  be used) for the duration of  the COVID-19 declaration under Section 564(b)(1) of the Act, 21 U.S.C. section 360bbb-3(b)(1), unless the authorization is terminated or revoked sooner.     Influenza A by PCR NEGATIVE NEGATIVE Final   Influenza B by PCR NEGATIVE NEGATIVE Final    Comment: (NOTE) The Xpert Xpress SARS-CoV-2/FLU/RSV plus assay is intended as an aid in the diagnosis of influenza from Nasopharyngeal swab specimens and should not be used as a sole basis for treatment. Nasal washings and aspirates are  unacceptable for Xpert Xpress SARS-CoV-2/FLU/RSV testing.  Fact Sheet for Patients: EntrepreneurPulse.com.au  Fact Sheet for Healthcare Providers: IncredibleEmployment.be  This test is not yet approved or cleared by the [place] FDA and has been authorized for detection and/or diagnosis of SARS-CoV-2 by FDA under an Emergency Use Authorization (EUA). This EUA will remain in effect (meaning this test can be used) for the duration of the COVID-19 declaration under Section 564(b)(1) of the Act, 21 U.S.C. section 360bbb-3(b)(1), unless the authorization is terminated or revoked.  Performed at [facility], [place] [address] [place], [place] [address]      Labs: Basic Metabolic Panel: Recent Labs  Lab 10/27/20 1719 10/29/20 0107 10/30/20 0406 10/31/20 0100 11/01/20 0105  NA 136 133* 136 137 135  K 3.5 3.8 3.4* 3.1* 3.7  CL 98 98 97* 99 98  CO2 22 20* '25 28 26  '$ GLUCOSE 133* 216* 235* 263* 219*  BUN 26* 51* 46* 42* 38*  CREATININE 1.19* 1.89* 1.51* 1.20* 1.30*  CALCIUM 8.4* 8.0* 8.0* 7.5* 7.0*   Liver Function Tests: Recent Labs  Lab 10/27/20 1719 10/29/20 0107 10/30/20 0406 10/31/20 0100 11/01/20 0105  AST 46* 57* 37 28 23  ALT 33 46* 38 32 27  ALKPHOS 81 73 65 71 62  BILITOT 1.2 0.9 0.2* <0.1* 0.6  PROT 7.0 6.5 5.9* 5.9* 5.5*  ALBUMIN 3.1* 2.8* 2.6* 2.8* 2.6*   No results for input(s): LIPASE, AMYLASE in the last 168 hours. No results for input(s): AMMONIA in the last 168 hours. CBC: Recent Labs  Lab 10/27/20 1719 10/29/20 0107 10/30/20 0406 10/31/20 0100 11/01/20 0105  WBC 9.8 11.2* 14.7* 14.2* 15.2*  NEUTROABS 7.5 9.6* 13.1* 12.2* 12.6*  HGB 11.4* 11.1* 10.7* 11.0* 10.8*  HCT 35.0* 32.7* 30.4* 32.3* 31.7*  MCV 98.0 95.9 93.3 95.0 94.6  PLT 237 269 270 303 274   Cardiac Enzymes: No results for input(s): CKTOTAL, CKMB, CKMBINDEX, TROPONINI in the last 168 hours. BNP: BNP (last 3 results) No results for  input(s): BNP in the last 8760 hours.  ProBNP (last 3 results) No results for input(s): PROBNP in the last 8760 hours.  CBG: Recent Labs  Lab 10/31/20 1204 10/31/20 1620 10/31/20 2121 11/01/20 0806 11/01/20 1203  GLUCAP 235* 170* 234* 136* 130*       Signed:  Domenic Polite MD.  [facility] 11/01/2020, 1:36 PM

## 2020-11-01 NOTE — Plan of Care (Signed)
  Problem: Education: Goal: Knowledge of General Education information will improve Description Including pain rating scale, medication(s)/side effects and non-pharmacologic comfort measures Outcome: Progressing   Problem: Health Behavior/Discharge Planning: Goal: Ability to manage health-related needs will improve Outcome: Progressing   

## 2020-11-03 ENCOUNTER — Telehealth: Payer: Self-pay | Admitting: Internal Medicine

## 2020-11-03 NOTE — Telephone Encounter (Signed)
Requesting cough medicine for chronic cough from COVID. She was admitted in the hospital on 10/27/20 and the cough has gotten worse since and caused her to be hoarse.   Please advise  Preferred pharmacy:  [facility] [address] [place]), [place] Phone:  [phone]  Fax:  [phone]

## 2020-11-04 MED ORDER — BENZONATATE 200 MG PO CAPS: 200.0000 mg | ORAL_CAPSULE | Freq: Three times a day (TID) | ORAL | 1 refills | Status: DC | PRN

## 2020-11-04 NOTE — Telephone Encounter (Signed)
Tessalon sent

## 2020-11-04 NOTE — Telephone Encounter (Signed)
Spoke with patient today. 

## 2020-11-09 ENCOUNTER — Other Ambulatory Visit: Payer: Self-pay

## 2020-11-09 NOTE — Progress Notes (Signed)
Subjective:    Patient ID: April Combs, female    DOB: 26-Aug-1945, 75 y.o.   MRN: [id number]  HPI The patient is here for follow up of from the hospital  Admitted 8/15 - 8/20 for pneumonia due to covid  She tested positive for COVID 8/9 and was treated with molnupivar, which she completed the day prior to admission.  48 hours before admission she started having increased shortness of breath and had hypoxia in the 80s in the ED.  She was found to have pneumonia.  Oxygen saturation improved with oxygen supplementation.  She was initially started on paxlovid which was stopped after two days when she was not improving.  She was started on IV remdesivir and steroids.  She was discharged home with oral Decadron for 3 additional days.  Her chronic medical conditions were stable and no changes in the medications were made.  She has been home from the hospital for just over a week.  She states slight improvement in her appetite.  There may be slight improvement in her cough and shortness of breath, but she is still experiencing shortness of breath with exertion.  She has noticed some wheezing at night and has tightness in her chest.  She is coughing and that is productive at times of brownish colored mucus.  She has not had any fevers, but has some headaches and lightheadedness.  She completed the 3 days of steroids when she got home.  She is using her Trelegy daily and the albuterol as needed.   Medications and allergies reviewed with patient and updated if appropriate.  Patient Active Problem List   Diagnosis Date Noted   COPD (chronic obstructive pulmonary disease) ([place]) 10/28/2020   Chronic heart failure with preserved ejection fraction (HFpEF) ([place]) 10/28/2020   Dyspnea 10/28/2020   Pneumonia due to COVID-19 virus 10/27/2020   Atherosclerosis of aorta ([place]) 12/31/2019   Physical deconditioning 08/18/2018   Bilateral leg weakness 02/15/2018   Prediabetes 08/10/2017   Hypertensive  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 ([place]) 04/26/2017   Gout 04/10/2017   Hypothyroidism 02/09/2017   Morbid obesity ([place]) 10/15/2015   Pulmonary emphysema ([place]) 04/29/2015   Lumbar radiculopathy 12/24/2014   Hx of tobacco use, presenting hazards to health 09/29/2014   Nail fungus 03/11/2014   FALSE POSITIVE NUCLEAR STRESS CHEST 01/28/2014   Chest pain on exertion 01/14/2014   GERD (gastroesophageal reflux disease) 05/15/2012   Positional vertigo 12/22/2011   Irregular heart beat 09/29/2011   Hyperlipidemia with target LDL less than 100 09/29/2011   SOB (shortness of breath) 08/23/2011   Essential hypertension 05/28/2008   OSTEOARTHRITIS 05/28/2008    Current Outpatient Medications on File Prior to Visit  Medication Sig Dispense Refill   acetaminophen (TYLENOL) 325 MG tablet Take 2 tablets (650 mg total) by mouth every 6 (six) hours as needed for mild pain (or Fever >/= 101).     albuterol (VENTOLIN HFA) 108 (90 Base) MCG/ACT inhaler Inhale 2 puffs into the lungs every 6 (six) hours as needed for wheezing or shortness of breath.     allopurinol (ZYLOPRIM) 300 MG tablet Take 1 tablet (300 mg total) by mouth daily. 90 tablet 3   atorvastatin (LIPITOR) 20 MG tablet Take 1 tablet (20 mg total) by mouth daily. 90 tablet 3   benzonatate (TESSALON) 200 MG capsule Take 1 capsule (200 mg total) by mouth 3 (three) times daily as needed for cough. 60 capsule 1   carvedilol (COREG) 12.5  MG tablet TAKE 1 AND 1/2 TABLETS BY  MOUTH TWICE DAILY WITH A  MEAL (Patient taking differently: Take 18.75 mg by mouth 2 (two) times daily with a meal.) 270 tablet 3   furosemide (LASIX) 20 MG tablet Take 1 tablet (20 mg total) by mouth daily as needed. (Patient taking differently: Take 20 mg by mouth daily as needed for fluid.) 45 tablet 3   gabapentin (NEURONTIN) 100 MG capsule TAKE 1 CAPSULE BY MOUTH IN  THE MORNING AND 2 CAPSULES  BY MOUTH AT BEDTIME 270 capsule 3   levothyroxine  (SYNTHROID) 50 MCG tablet Take 1 tablet by mouth once daily 90 tablet 0   lisinopril-hydrochlorothiazide (ZESTORETIC) 20-25 MG tablet TAKE 1 TABLET BY MOUTH  DAILY 90 tablet 3   naproxen sodium (ALEVE) 220 MG tablet Take 220 mg by mouth daily as needed (pain).     omeprazole (PRILOSEC) 20 MG capsule Take 1 capsule (20 mg total) by mouth 2 (two) times daily before a meal. 180 capsule 3   No current facility-administered medications on file prior to visit.    Past Medical History:  Diagnosis Date   Anemia    Anxiety    BRBPR (bright red blood per rectum) 02/05/2015   Coronary artery disease (CAD) excluded 01/2014   Low Risk Myoview (false positive) with possible inferior-inferolateral ischemia, but with nonsustained VT --> CATH --> Angiographically Normal Coronary Arteries; coronary CT angiogram showed mild approximately disease but otherwise no obstructive disease.  Coronary calcium score 40.   Depression    FALSE POSITIVE NUCLEAR STRESS CHEST 01/28/2014   Fungal dermatitis 09/29/2011   GERD (gastroesophageal reflux disease)    Gout 04/10/2017   First episode - Left foot 03/2017   Hyperlipidemia    Hyperlipidemia with target LDL less than 100 09/29/2011   Hyperplastic colon polyp    Hypertension    Hypertensive 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 ([place]) 04/26/2017   HYPERTRIGLYCERIDEMIA 07/12/2008   Qualifier: Diagnosis of  By: Birdie Riddle MD, Katherine     Hypothyroidism 02/09/2017   Irregular heart beat 09/29/2011   Lumbar radiculopathy 12/24/2014   Morbid obesity ([place]) 10/15/2015   NICM (nonischemic cardiomyopathy) ([place])    Essentially resolved. Echocardiogram July 2013 showed normal EF greater than 55%. Important septal motion. Normal LV filling pressures. Mild MR and mild anterior MVP   Obesity (BMI 30-39.9)    Osteoarthritis    Positional vertigo    Prediabetes 08/10/2017   Pulmonary emphysema ([place]) 04/29/2015   PFTs 2017 - moderate-severe obstructive disease,  some restrictive disease   SOB (shortness of breath) 08/23/2011    Past Surgical History:  Procedure Laterality Date   ABDOMINAL HYSTERECTOMY     APPENDECTOMY  as child   BREAST BIOPSY Right    benign   CHOLECYSTECTOMY     COLONOSCOPY     CORONARY CALCIUM SCORE/CT ANGIOGRAM  07/2017   Calcium score 40.  Mild LAD stenosis, but no other significant disease.   DOPPLER ECHOCARDIOGRAPHY  July 2013   09/20/11. normal Lv thickness and function with EF greater thatn 55%   Exercise tolerance test - CPET-MET-TEST  July 2013   Submaximal effort. Only 80% of her, therefore peak VO2 of 75% is likely an underestimate. High resting heart rate, achieving 86% of heart rate. --> Unable to interpret due to lack of effort.   GASTRIC BYPASS  1980   GASTRIC BYPASS     KIDNEY STONE SURGERY     LEFT HEART CATHETERIZATION WITH CORONARY ANGIOGRAM  N/A 02/01/2014   Procedure: LEFT HEART CATHETERIZATION WITH CORONARY ANGIOGRAM;  Surgeon: Leonie Man, MD;  Location[facility] CATH LAB;  Service: Cardiovascular: Angiographically normal coronaries   NM MYOVIEW LTD  November 2015   4:20 min, 4.6 METS --> DOE, but no chest discomfort; Significant + ST -T wave changes noted with NSVT & PVCs. Images suggest Inferior-inferolateral ischemia.  Low Risk. -- FALSE POSITIVE   Pulmonary Function Tests  July 2013   Increased densities, decreased FVC - consistent with obesity hypoventilation; FEV1 is 58%, FVC 60% of predicted.   TRANSTHORACIC ECHOCARDIOGRAM  04/2017   Mildly reduced EF of 45%.  No regional wall motion normality.  GR 1 DD   UPPER GASTROINTESTINAL ENDOSCOPY      Social History   Socioeconomic History   Marital status: Married    Spouse name: Not on file   Number of children: 3   Years of education: Not on file   Highest education level: Not on file  Occupational History   Occupation: Retired  Tobacco Use   Smoking status: Former    Packs/day: 1.00    Years: 30.00    Pack years: 30.00    Types: Cigarettes     Quit date: 01/18/2003    Years since quitting: 17.8   Smokeless tobacco: Never  Substance and Sexual Activity   Alcohol use: Yes    Alcohol/week: 3.0 standard drinks    Types: 3 Cans of beer per week    Comment: drinks 2-3 beers a week   Drug use: No   Sexual activity: Not on file  Other Topics Concern   Not on file  Social History Narrative   She is married to [name], also patient of Dr. Ellyn Hack.   Mother of 3, grandmother 5.   Quit smoking in 2005. Social alcohol.   No routine exercise.   Social Determinants of Health   Financial Resource Strain: Medium Risk   Difficulty of Paying Living Expenses: Somewhat hard  Food Insecurity: Not on file  Transportation Needs: Not on file  Physical Activity: Not on file  Stress: Not on file  Social Connections: Not on file    Family History  Problem Relation Age of Onset   Kidney disease Daughter    Multiple sclerosis Daughter    Heart disease Father    Heart disease Mother    Thyroid disease Mother    Heart disease Paternal Grandfather    Colon cancer Other        early 95's.  Genetic testing from maternal side of family.   Colon cancer Maternal Aunt    Breast cancer Maternal Aunt    Lung cancer Sister        smoker   Diabetes Sister    Colon cancer Maternal Uncle    Diabetes Daughter        x3   Heart disease Maternal Aunt    Colon polyps Neg Hx    Esophageal cancer Neg Hx    Gallbladder disease Neg Hx    Rectal cancer Neg Hx    Stomach cancer Neg Hx     Review of Systems  Constitutional:  Positive for appetite change (decreased). Negative for fever.  HENT:  Negative for congestion, ear pain, sinus pain and sore throat.        No loss of taste or smell  Respiratory:  Positive for cough (brownish thick mucus), chest tightness, shortness of breath (slightly better) and wheezing (at night).   Cardiovascular:  Positive  for palpitations (occ). Negative for chest pain and leg swelling.  Gastrointestinal:   Negative for diarrhea and nausea.       Gerd  Neurological:  Positive for light-headedness and headaches (relieved by aleve).      Objective:   Vitals:   11/10/20 1019  BP: 112/76  Pulse: 78  Temp: 97.9 F (36.6 C)  SpO2: 97%   BP Readings from Last 3 Encounters:  11/10/20 112/76  11/01/20 135/74  10/27/20 115/64   Wt Readings from Last 3 Encounters:  11/10/20 233 lb (105.7 kg)  11/01/20 246 lb 7.6 oz (111.8 kg)  09/29/20 239 lb 12.8 oz (108.8 kg)   Body mass index is 38.77 kg/m.   Physical Exam    Constitutional: Appears well-developed and well-nourished. No distress.  HENT:  Head: Normocephalic and atraumatic.  Neck: Neck supple. No tracheal deviation present. No thyromegaly present.  No cervical lymphadenopathy Cardiovascular: Normal rate, regular rhythm and normal heart sounds.   No murmur heard. No carotid bruit .  No edema Pulmonary/Chest: Effort normal. No respiratory distress.  Diffuse expiratory wheezing. No rales.  Skin: Skin is warm and dry. Not diaphoretic.  Psychiatric: Normal mood and affect. Behavior is normal.      Assessment & Plan:    See Problem List for Assessment and Plan of chronic medical problems.    This visit occurred during the SARS-CoV-2 public health emergency.  Safety protocols were in place, including screening questions prior to the visit, additional usage of staff PPE, and extensive cleaning of exam room while observing appropriate contact time as indicated for disinfecting solutions.

## 2020-11-10 ENCOUNTER — Ambulatory Visit (INDEPENDENT_AMBULATORY_CARE_PROVIDER_SITE_OTHER): Payer: Medicare Other | Admitting: Internal Medicine

## 2020-11-10 ENCOUNTER — Encounter: Payer: Self-pay | Admitting: Internal Medicine

## 2020-11-10 VITALS — BP 112/76 | HR 78 | Temp 97.9°F | Ht 65.0 in | Wt 233.0 lb

## 2020-11-10 DIAGNOSIS — B9689 Other specified bacterial agents as the cause of diseases classified elsewhere: Secondary | ICD-10-CM | POA: Insufficient documentation

## 2020-11-10 DIAGNOSIS — J208 Acute bronchitis due to other specified organisms: Secondary | ICD-10-CM

## 2020-11-10 DIAGNOSIS — U071 COVID-19: Secondary | ICD-10-CM

## 2020-11-10 DIAGNOSIS — J1282 Pneumonia due to coronavirus disease 2019: Secondary | ICD-10-CM | POA: Diagnosis not present

## 2020-11-10 DIAGNOSIS — J441 Chronic obstructive pulmonary disease with (acute) exacerbation: Secondary | ICD-10-CM | POA: Diagnosis not present

## 2020-11-10 MED ORDER — PREDNISONE 20 MG PO TABS: 40.0000 mg | ORAL_TABLET | Freq: Every day | ORAL | 0 refills | Status: DC

## 2020-11-10 MED ORDER — DOXYCYCLINE HYCLATE 100 MG PO TABS: 100.0000 mg | ORAL_TABLET | Freq: Two times a day (BID) | ORAL | 0 refills | Status: AC

## 2020-11-10 MED ORDER — BEVESPI AEROSPHERE 9-4.8 MCG/ACT IN AERO: 1.0000 | INHALATION_SPRAY | Freq: Every day | RESPIRATORY_TRACT | 11 refills | Status: DC

## 2020-11-10 NOTE — Assessment & Plan Note (Signed)
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

## 2020-11-10 NOTE — Assessment & Plan Note (Signed)
Hospitalized 8/15-8/20 for pneumonia due to [place] Initially treated with molnupiravir as an outpatient and initially Paxlovid as an inpatient without improvement and was changed to remdesivir and IV steroids, oxygen supplementation Symptoms improved and she was discharged home Oxygen saturation is good here 97% on room air, but still experiencing shortness of breath, productive cough, wheeze and chest tightness COVID has been successfully treated, but now is experiencing COPD exacerbation with possible bacterial bronchitis Start doxycycline 100 mg twice daily x10 days, prednisone 40 mg daily x5 days Continue Trelegy daily and albuterol as needed Continue [employer]

## 2020-11-10 NOTE — Assessment & Plan Note (Signed)
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

## 2020-11-10 NOTE — Patient Instructions (Signed)
     Medications changes include :   prednisone 40 mg daily x 5 days, doxycycline twice daily x 10 days    Your prescription(s) have been submitted to your pharmacy. Please take as directed and contact our office if you believe you are having problem(s) with the medication(s).

## 2020-12-05 ENCOUNTER — Ambulatory Visit (INDEPENDENT_AMBULATORY_CARE_PROVIDER_SITE_OTHER): Payer: Medicare Other

## 2020-12-05 DIAGNOSIS — Z0000 Encounter for general adult medical examination without abnormal findings: Secondary | ICD-10-CM

## 2020-12-05 NOTE — Patient Instructions (Signed)
April Combs , Thank you for taking time to come for your Medicare Wellness Visit. I appreciate your ongoing commitment to your health goals. Please review the following plan we discussed and let me know if I can assist you in the future.   Screening recommendations/referrals: Colonoscopy: 01/26/2012; due every 10 years Mammogram: patient declined Bone Density: patient declined Recommended yearly ophthalmology/optometry visit for glaucoma screening and checkup Recommended yearly dental visit for hygiene and checkup  Vaccinations: Influenza vaccine: 12/28/2019; due Fall 2022 Pneumococcal vaccine: 09/19/2013, 09/25/2014 Tdap vaccine: 02/29/2008; due every 10 years (overdue) Shingles vaccine: never done   Covid-19: 06/01/2019, 12/28/2019  Advanced directives: Please bring a copy of your health care power of attorney and living will to the office at your convenience.  Conditions/risks identified: Yes; no healthcare goals at this time.  Next appointment: 12/07/2021 at 1:00 pm telephone visit with [facility] Wellness Nurse.   Preventive Care 77 Years and Older, Female Preventive care refers to lifestyle choices and visits with your health care provider that can promote health and wellness. What does preventive care include? A yearly physical exam. This is also called an annual well check. Dental exams once or twice a year. Routine eye exams. Ask your health care provider how often you should have your eyes checked. Personal lifestyle choices, including: Daily care of your teeth and gums. Regular physical activity. Eating a healthy diet. Avoiding tobacco and drug use. Limiting alcohol use. Practicing safe sex. Taking low-dose aspirin every day. Taking vitamin and mineral supplements as recommended by your health care provider. What happens during an annual well check? The services and screenings done by your health care provider during your annual well check will depend on your age, overall  health, lifestyle risk factors, and family history of disease. Counseling  Your health care provider may ask you questions about your: Alcohol use. Tobacco use. Drug use. Emotional well-being. Home and relationship well-being. Sexual activity. Eating habits. History of falls. Memory and ability to understand (cognition). Work and work Statistician. Reproductive health. Screening  You may have the following tests or measurements: Height, weight, and BMI. Blood pressure. Lipid and cholesterol levels. These may be checked every 5 years, or more frequently if you are over 84 years old. Skin check. Lung cancer screening. You may have this screening every year starting at age 74 if you have a 30-pack-year history of smoking and currently smoke or have quit within the past 15 years. Fecal occult blood test (FOBT) of the stool. You may have this test every year starting at age 52. Flexible sigmoidoscopy or colonoscopy. You may have a sigmoidoscopy every 5 years or a colonoscopy every 10 years starting at age 11. Hepatitis C blood test. Hepatitis B blood test. Sexually transmitted disease (STD) testing. Diabetes screening. This is done by checking your blood sugar (glucose) after you have not eaten for a while (fasting). You may have this done every 1-3 years. Bone density scan. This is done to screen for osteoporosis. You may have this done starting at age 70. Mammogram. This may be done every 1-2 years. Talk to your health care provider about how often you should have regular mammograms. Talk with your health care provider about your test results, treatment options, and if necessary, the need for more tests. Vaccines  Your health care provider may recommend certain vaccines, such as: Influenza vaccine. This is recommended every year. Tetanus, diphtheria, and acellular pertussis (Tdap, Td) vaccine. You may need a Td booster every 10 years. Zoster vaccine.  You may need this after age  5. Pneumococcal 13-valent conjugate (PCV13) vaccine. One dose is recommended after age 38. Pneumococcal polysaccharide (PPSV23) vaccine. One dose is recommended after age 93. Talk to your health care provider about which screenings and vaccines you need and how often you need them. This information is not intended to replace advice given to you by your health care provider. Make sure you discuss any questions you have with your health care provider. Document Released: 03/28/2015 Document Revised: 11/19/2015 Document Reviewed: 12/31/2014 Elsevier Interactive Patient Education  2017 [place] Prevention in the Home Falls can cause injuries. They can happen to people of all ages. There are many things you can do to make your home safe and to help prevent falls. What can I do on the outside of my home? Regularly fix the edges of walkways and driveways and fix any cracks. Remove anything that might make you trip as you walk through a door, such as a raised step or threshold. Trim any bushes or trees on the path to your home. Use bright outdoor lighting. Clear any walking paths of anything that might make someone trip, such as rocks or tools. Regularly check to see if handrails are loose or broken. Make sure that both sides of any steps have handrails. Any raised decks and porches should have guardrails on the edges. Have any leaves, snow, or ice cleared regularly. Use sand or salt on walking paths during winter. Clean up any spills in your garage right away. This includes oil or grease spills. What can I do in the bathroom? Use night lights. Install grab bars by the toilet and in the tub and shower. Do not use towel bars as grab bars. Use non-skid mats or decals in the tub or shower. If you need to sit down in the shower, use a plastic, non-slip stool. Keep the floor dry. Clean up any water that spills on the floor as soon as it happens. Remove soap buildup in the tub or shower  regularly. Attach bath mats securely with double-sided non-slip rug tape. Do not have throw rugs and other things on the floor that can make you trip. What can I do in the bedroom? Use night lights. Make sure that you have a light by your bed that is easy to reach. Do not use any sheets or blankets that are too big for your bed. They should not hang down onto the floor. Have a firm chair that has side arms. You can use this for support while you get dressed. Do not have throw rugs and other things on the floor that can make you trip. What can I do in the kitchen? Clean up any spills right away. Avoid walking on wet floors. Keep items that you use a lot in easy-to-reach places. If you need to reach something above you, use a strong step stool that has a grab bar. Keep electrical cords out of the way. Do not use floor polish or wax that makes floors slippery. If you must use wax, use non-skid floor wax. Do not have throw rugs and other things on the floor that can make you trip. What can I do with my stairs? Do not leave any items on the stairs. Make sure that there are handrails on both sides of the stairs and use them. Fix handrails that are broken or loose. Make sure that handrails are as long as the stairways. Check any carpeting to make sure that it is  firmly attached to the stairs. Fix any carpet that is loose or worn. Avoid having throw rugs at the top or bottom of the stairs. If you do have throw rugs, attach them to the floor with carpet tape. Make sure that you have a light switch at the top of the stairs and the bottom of the stairs. If you do not have them, ask someone to add them for you. What else can I do to help prevent falls? Wear shoes that: Do not have high heels. Have rubber bottoms. Are comfortable and fit you well. Are closed at the toe. Do not wear sandals. If you use a stepladder: Make sure that it is fully opened. Do not climb a closed stepladder. Make sure that  both sides of the stepladder are locked into place. Ask someone to hold it for you, if possible. Clearly mark and make sure that you can see: Any grab bars or handrails. First and last steps. Where the edge of each step is. Use tools that help you move around (mobility aids) if they are needed. These include: Canes. Walkers. Scooters. Crutches. Turn on the lights when you go into a dark area. Replace any light bulbs as soon as they burn out. Set up your furniture so you have a clear path. Avoid moving your furniture around. If any of your floors are uneven, fix them. If there are any pets around you, be aware of where they are. Review your medicines with your doctor. Some medicines can make you feel dizzy. This can increase your chance of falling. Ask your doctor what other things that you can do to help prevent falls. This information is not intended to replace advice given to you by your health care provider. Make sure you discuss any questions you have with your health care provider. Document Released: 12/26/2008 Document Revised: 08/07/2015 Document Reviewed: 04/05/2014 Elsevier Interactive Patient Education  2017 [employer].

## 2020-12-05 NOTE — Progress Notes (Signed)
I connected with April Combs today by telephone and verified that I am speaking with the correct person using two identifiers. Location patient: home Location provider: work Persons participating in the virtual visit: patient, provider.   I discussed the limitations, risks, security and privacy concerns of performing an evaluation and management service by telephone and the availability of in person appointments. I also discussed with the patient that there may be a patient responsible charge related to this service. The patient expressed understanding and verbally consented to this telephonic visit.    Interactive audio and video telecommunications were attempted between this provider and patient, however failed, due to patient having technical difficulties OR patient did not have access to video capability.  We continued and completed visit with audio only.  Some vital signs may be absent or patient reported.   Time Spent with patient on telephone encounter: 40 minutes  Subjective:   April Combs is a 75 y.o. female who presents for Medicare Annual (Subsequent) preventive examination.  Review of Systems     Cardiac Risk Factors include: advanced age (>37mn, >>57women);dyslipidemia;family history of premature cardiovascular disease;hypertension;obesity (BMI >30kg/m2)     Objective:    There were no vitals filed for this visit. There is no height or weight on file to calculate BMI.  Advanced Directives 12/05/2020 10/29/2020 08/20/2019 04/06/2017 04/05/2015 02/01/2014  Does Patient Have a Medical Advance Directive? Yes No Yes Yes Yes Yes  Type of Advance Directive Living will - - Living will - HAnetaLiving will  Does patient want to make changes to medical advance directive? No - Patient declined - No - Patient declined - - No - Patient declined  Would patient like information on creating a medical advance directive? - No - Patient declined - - - -     Current Medications (verified) Outpatient Encounter Medications as of 12/05/2020  Medication Sig   acetaminophen (TYLENOL) 325 MG tablet Take 2 tablets (650 mg total) by mouth every 6 (six) hours as needed for mild pain (or Fever >/= 101).   albuterol (VENTOLIN HFA) 108 (90 Base) MCG/ACT inhaler Inhale 2 puffs into the lungs every 6 (six) hours as needed for wheezing or shortness of breath.   allopurinol (ZYLOPRIM) 300 MG tablet Take 1 tablet (300 mg total) by mouth daily.   atorvastatin (LIPITOR) 20 MG tablet Take 1 tablet (20 mg total) by mouth daily.   benzonatate (TESSALON) 200 MG capsule Take 1 capsule (200 mg total) by mouth 3 (three) times daily as needed for cough.   carvedilol (COREG) 12.5 MG tablet TAKE 1 AND 1/2 TABLETS BY  MOUTH TWICE DAILY WITH A  MEAL (Patient taking differently: Take 18.75 mg by mouth 2 (two) times daily with a meal.)   furosemide (LASIX) 20 MG tablet Take 1 tablet (20 mg total) by mouth daily as needed. (Patient taking differently: Take 20 mg by mouth daily as needed for fluid.)   gabapentin (NEURONTIN) 100 MG capsule TAKE 1 CAPSULE BY MOUTH IN  THE MORNING AND 2 CAPSULES  BY MOUTH AT BEDTIME   Glycopyrrolate-Formoterol (BEVESPI AEROSPHERE) 9-4.8 MCG/ACT AERO Inhale 1 Act into the lungs daily.   levothyroxine (SYNTHROID) 50 MCG tablet Take 1 tablet by mouth once daily   lisinopril-hydrochlorothiazide (ZESTORETIC) 20-25 MG tablet TAKE 1 TABLET BY MOUTH  DAILY   naproxen sodium (ALEVE) 220 MG tablet Take 220 mg by mouth daily as needed (pain).   omeprazole (PRILOSEC) 20 MG capsule Take 1 capsule (20 mg  total) by mouth 2 (two) times daily before a meal.   predniSONE (DELTASONE) 20 MG tablet Take 2 tablets (40 mg total) by mouth daily with breakfast.   No facility-administered encounter medications on file as of 12/05/2020.    Allergies (verified) Patient has no known allergies.   History: Past Medical History:  Diagnosis Date   Anemia    Anxiety    BRBPR  (bright red blood per rectum) 02/05/2015   Coronary artery disease (CAD) excluded 01/2014   Low Risk Myoview (false positive) with possible inferior-inferolateral ischemia, but with nonsustained VT --> CATH --> Angiographically Normal Coronary Arteries; coronary CT angiogram showed mild approximately disease but otherwise no obstructive disease.  Coronary calcium score 40.   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 Echocardiogram July 2013 showed normal EF greater than 55%. Important septal motion. Normal LV filling pressures. Mild MR and mild anterior MVP   Obesity (BMI 30-39.9)    Osteoarthritis    Positional vertigo    Prediabetes 08/10/2017   Pulmonary emphysema ([place]) 04/29/2015   PFTs 2017 - moderate-severe obstructive disease, some restrictive disease   SOB (shortness of breath) 08/23/2011   Past Surgical History:  Procedure Laterality Date   ABDOMINAL HYSTERECTOMY     APPENDECTOMY  as child   BREAST BIOPSY Right    benign   CHOLECYSTECTOMY     COLONOSCOPY     CORONARY CALCIUM SCORE/CT ANGIOGRAM  07/2017   Calcium score 40.  Mild LAD stenosis, but no other significant disease.   DOPPLER ECHOCARDIOGRAPHY  July 2013   09/20/11. normal Lv thickness and function with EF greater thatn 55%   Exercise tolerance test - CPET-MET-TEST  July 2013   Submaximal  effort. Only 80% of her, therefore peak VO2 of 75% is likely an underestimate. High resting heart rate, achieving 86% of heart rate. --> Unable to interpret due to lack of effort.   GASTRIC BYPASS  1980   GASTRIC BYPASS     KIDNEY STONE SURGERY     LEFT HEART CATHETERIZATION WITH CORONARY ANGIOGRAM N/A 02/01/2014   Procedure: LEFT HEART CATHETERIZATION WITH CORONARY ANGIOGRAM;  Surgeon: Leonie Man, MD;  Location: [facility] CATH LAB;  Service: Cardiovascular: Angiographically normal coronaries   NM MYOVIEW LTD  November 2015   4:20 min, 4.6 METS --> DOE, but no chest discomfort; Significant + ST -T wave changes noted with NSVT & PVCs. Images suggest Inferior-inferolateral ischemia.  Low Risk. -- FALSE POSITIVE   Pulmonary Function Tests  July 2013   Increased densities, decreased FVC - consistent with obesity hypoventilation; FEV1 is 58%, FVC 60% of predicted.   TRANSTHORACIC ECHOCARDIOGRAM  04/2017   Mildly reduced EF of 45%.  No regional wall motion normality.  GR 1 DD   UPPER GASTROINTESTINAL ENDOSCOPY     Family History  Problem Relation Age of Onset  Kidney disease Daughter    Multiple sclerosis Daughter    Heart disease Father    Heart disease Mother    Thyroid disease Mother    Heart disease Paternal Grandfather    Colon cancer Other        early 26's.  Genetic testing from maternal side of family.   Colon cancer Maternal Aunt    Breast cancer Maternal Aunt    Lung cancer Sister        smoker   Diabetes Sister    Colon cancer Maternal Uncle    Diabetes Daughter        x3   Heart disease Maternal Aunt    Colon polyps Neg Hx    Esophageal cancer Neg Hx    Gallbladder disease Neg Hx    Rectal cancer Neg Hx    Stomach cancer Neg Hx    Social History   Socioeconomic History   Marital status: Married    Spouse name: Not on file   Number of children: 3   Years of education: Not on file   Highest education level: Not on file  Occupational History   Occupation: Retired   Tobacco Use   Smoking status: Former    Packs/day: 1.00    Years: 30.00    Pack years: 30.00    Types: Cigarettes    Quit date: 01/18/2003    Years since quitting: 17.8   Smokeless tobacco: Never  Substance and Sexual Activity   Alcohol use: Yes    Alcohol/week: 3.0 standard drinks    Types: 3 Cans of beer per week    Comment: drinks 2-3 beers a week   Drug use: No   Sexual activity: Not on file  Other Topics Concern   Not on file  Social History Narrative   She is married to [name], also patient of Dr. Ellyn Hack.   Mother of 3, grandmother 33.   Quit smoking in 2005. Social alcohol.   No routine exercise.   Social Determinants of Health   Financial Resource Strain: Low Risk    Difficulty of Paying Living Expenses: Not hard at all  Food Insecurity: No Food Insecurity   Worried About Charity fundraiser in the Last Year: Never true   [place] in the Last Year: Never true  Transportation Needs: No Transportation Needs   Lack of Transportation (Medical): No   Lack of Transportation (Non-Medical): No  Physical Activity: Sufficiently Active   Days of Exercise per Week: 5 days   Minutes of Exercise per Session: 30 min  Stress: No Stress Concern Present   Feeling of Stress : Not at all  Social Connections: Socially Isolated   Frequency of Communication with Friends and Family: More than three times a week   Frequency of Social Gatherings with Friends and Family: Once a week   Attends Religious Services: Never   Marine scientist or Organizations: No   Attends Archivist Meetings: Never   Marital Status: Widowed    Tobacco Counseling Counseling given: Not Answered   Clinical Intake:  Pre-visit preparation completed: Yes  Pain : No/denies pain     Nutritional Risks: None Diabetes: No  How often do you need to have someone help you when you read instructions, pamphlets, or other written materials from your doctor or pharmacy?: 1 -  Never What is the last grade level you completed in school?: HSG  Diabetic? no  Interpreter Needed?: No  Information entered  by :: Lisette Abu, LPN.   Activities of Daily Living In your present state of health, do you have any difficulty performing the following activities: 12/05/2020 10/29/2020  Hearing? N N  Vision? N N  Difficulty concentrating or making decisions? N N  Walking or climbing stairs? N N  Dressing or bathing? N N  Doing errands, shopping? N N  Preparing Food and eating ? N -  Using the Toilet? N -  In the past six months, have you accidently leaked urine? N -  Do you have problems with loss of bowel control? N -  Managing your Medications? N -  Managing your Finances? N -  Housekeeping or managing your Housekeeping? N -  Some recent data might be hidden    Patient Care Team: Binnie Rail, MD as PCP - General (Internal Medicine) Leonie Man, MD as PCP - Cardiology (Cardiology) Leonie Man, MD as Consulting Physician (Cardiology) Lafayette Dragon, MD (Inactive) as Consulting Physician (Gastroenterology) Brand Males, MD as Consulting Physician (Pulmonary Disease) Charlton Haws, [facility] as Pharmacist (Pharmacist)  Indicate any recent Medical Services you may have received from other than [facility] providers in the past year (date may be approximate).     Assessment:   This is a routine wellness examination for Lanetta.  Hearing/Vision screen Hearing Screening - Comments:: Patient denied any hearing difficulty.   No hearing aids.  Vision Screening - Comments:: Patient wears corrective glasses/contacts.  Eye exam done annually by: [facility]  Dietary issues and exercise activities discussed: Current Exercise Habits: Home exercise routine, Type of exercise: walking, Time (Minutes): 30, Frequency (Times/Week): 5, Weekly Exercise (Minutes/Week): 150, Intensity: Mild, Exercise limited by: respiratory conditions(s)   Goals Addressed   None    Depression Screen PHQ 2/9 Scores 12/05/2020 08/20/2019 08/18/2018 12/13/2016 09/08/2016 10/15/2015 09/25/2014  PHQ - 2 Score 0 0 0 0 0 0 0  PHQ- 9 Score - - - - 0 - -    Fall Risk Fall Risk  12/05/2020 08/20/2020 08/20/2019 02/19/2019 08/18/2018  Falls in the past year? 0 0 1 1 0  Comment - - - - -  Number falls in past yr: 0 0 1 1 0  Injury with Fall? 0 0 0 0 -  Risk for fall due to : No Fall Risks No Fall Risks Orthopedic patient;Impaired balance/gait - -  Follow up - Falls evaluation completed Falls evaluation completed;Education provided;Falls prevention discussed - -    FALL RISK PREVENTION PERTAINING TO THE HOME:  Any stairs in or around the home? No  If so, are there any without handrails? No  Home free of loose throw rugs in walkways, pet beds, electrical cords, etc? Yes  Adequate lighting in your home to reduce risk of falls? Yes   ASSISTIVE DEVICES UTILIZED TO PREVENT FALLS:  Life alert? No  Use of a cane, walker or w/c? No  Grab bars in the bathroom? No  Shower chair or bench in shower? Yes  Elevated toilet seat or a handicapped toilet? No   TIMED UP AND GO:  Was the test performed? No .  Length of time to ambulate 10 feet: n/a sec.   Gait steady and fast without use of assistive device  Cognitive Function: Normal cognitive status assessed by direct observation by this Nurse Health Advisor. No abnormalities found.          Immunizations Immunization History  Administered Date(s) Administered   Fluad Quad(high Dose 65+) 11/09/2018   Influenza, High Dose  Seasonal PF 01/28/2015, 12/24/2015, 12/13/2016, 11/16/2017, 12/28/2019   Influenza,inj,Quad PF,6+ Mos 12/24/2015   Janssen (J&J) SARS-COV-2 Vaccination 06/01/2019, 12/28/2019   Pneumococcal Conjugate-13 09/19/2013   Pneumococcal Polysaccharide-23 09/25/2014   Tdap 02/29/2008    TDAP status: Due, Education has been provided regarding the importance of this vaccine. Advised may receive this vaccine at local pharmacy or  Health Dept. Aware to provide a copy of the vaccination record if obtained from local pharmacy or Health Dept. Verbalized acceptance and understanding.  Flu Vaccine status: Up to date  Pneumococcal vaccine status: Up to date  Covid-19 vaccine status: Completed vaccines  Qualifies for Shingles Vaccine? Yes   Zostavax completed No   Shingrix Completed?: No.    Education has been provided regarding the importance of this vaccine. Patient has been advised to call insurance company to determine out of pocket expense if they have not yet received this vaccine. Advised may also receive vaccine at local pharmacy or Health Dept. Verbalized acceptance and understanding.  Screening Tests Health Maintenance  Topic Date Due   Zoster Vaccines- Shingrix (1 of 2) Never done   COVID-19 Vaccine (3 - Booster for Janssen series) 04/29/2020   INFLUENZA VACCINE  06/12/2021 (Originally 10/13/2020)   DEXA SCAN  08/20/2021 (Originally 03/15/2017)   TETANUS/TDAP  08/20/2021 (Originally 02/28/2018)   COLONOSCOPY (Pts 45-7yr Insurance coverage will need to be confirmed)  01/25/2022   Hepatitis C Screening  Completed   HPV VACCINES  Aged Out    Health Maintenance  Health Maintenance Due  Topic Date Due   Zoster Vaccines- Shingrix (1 of 2) Never done   COVID-19 Vaccine (3 - Booster for Janssen series) 04/29/2020    Colorectal cancer screening: Type of screening: Colonoscopy. Completed 01/26/2012. Repeat every 10 years  Mammogram status: No longer required due to patient refusal.  Lung Cancer Screening: (Low Dose CT Chest recommended if Age 74-80years, 30 pack-year currently smoking OR have quit w/in 15years.) does not qualify.   Lung Cancer Screening Referral: no  Additional Screening:  Hepatitis C Screening: does qualify; Completed: yes  Vision Screening: Recommended annual ophthalmology exams for early detection of glaucoma and other disorders of the eye. Is the patient up to date with their annual  eye exam?  No  Who is the provider or what is the name of the office in which the patient attends annual eye exams[place] not established with a provider, would they like to be referred to a provider to establish care? No .   Dental Screening: Recommended annual dental exams for proper oral hygiene  Community Resource Referral / Chronic Care Management: CRR required this visit?  No   CCM required this visit?  No      Plan:     I have personally reviewed and noted the following in the patient's chart:   Medical and social history Use of alcohol, tobacco or illicit drugs  Current medications and supplements including opioid prescriptions.  Functional ability and status Nutritional status Physical activity Advanced directives List of other physicians Hospitalizations, surgeries, and ER visits in previous 12 months Vitals Screenings to include cognitive, depression, and falls Referrals and appointments  In addition, I have reviewed and discussed with patient certain preventive protocols, quality metrics, and best practice recommendations. A written personalized care plan for preventive services as well as general preventive health recommendations were provided to patient.     SSheral Flow LPN   [id number]  Nurse Notes:  Patient is cogitatively  intact. There were no vitals filed for this visit. There is no height or weight on file to calculate BMI. Patient stated that she has no issues with gait or balance; does not use any assistive devices. Medications reviewed with patient; no opioid use noted. Hearing Screening - Comments:: Patient denied any hearing difficulty.   No hearing aids.  Vision Screening - Comments:: Patient wears corrective glasses/contacts.  Eye exam done annually by: [facility]

## 2020-12-31 ENCOUNTER — Other Ambulatory Visit: Payer: Self-pay | Admitting: Internal Medicine

## 2021-01-04 ENCOUNTER — Other Ambulatory Visit: Payer: Self-pay | Admitting: Internal Medicine

## 2021-01-19 ENCOUNTER — Other Ambulatory Visit: Payer: Self-pay | Admitting: Internal Medicine

## 2021-01-20 ENCOUNTER — Other Ambulatory Visit: Payer: Self-pay | Admitting: *Deleted

## 2021-01-20 DIAGNOSIS — Z87891 Personal history of nicotine dependence: Secondary | ICD-10-CM

## 2021-02-11 ENCOUNTER — Other Ambulatory Visit: Payer: Self-pay

## 2021-02-11 ENCOUNTER — Ambulatory Visit
Admission: RE | Admit: 2021-02-11 | Discharge: 2021-02-11 | Disposition: A | Discharge status: Home / Self Care | Payer: Medicare Other | Source: Ambulatory Visit | Attending: Acute Care | Admitting: Acute Care

## 2021-02-11 DIAGNOSIS — I2510 Atherosclerotic heart disease of native coronary artery without angina pectoris: Secondary | ICD-10-CM | POA: Diagnosis not present

## 2021-02-11 DIAGNOSIS — Z87891 Personal history of nicotine dependence: Secondary | ICD-10-CM

## 2021-02-11 DIAGNOSIS — J432 Centrilobular emphysema: Secondary | ICD-10-CM | POA: Diagnosis not present

## 2021-02-11 DIAGNOSIS — J929 Pleural plaque without asbestos: Secondary | ICD-10-CM | POA: Diagnosis not present

## 2021-02-18 DIAGNOSIS — N183 Chronic kidney disease, stage 3 unspecified: Secondary | ICD-10-CM | POA: Insufficient documentation

## 2021-02-18 DIAGNOSIS — N1831 Chronic kidney disease, stage 3a: Secondary | ICD-10-CM | POA: Insufficient documentation

## 2021-02-18 NOTE — Patient Instructions (Addendum)
   Flu immunization administered today.     Blood work was ordered.     Medications changes include :   rybelsus 3 mg daily   Your prescription(s) have been submitted to your pharmacy. Please take as directed and contact our office if you believe you are having problem(s) with the medication(s).    Please followup in 6 months

## 2021-02-18 NOTE — Progress Notes (Signed)
Subjective:    Patient ID: April Combs, female    DOB: 1946-03-03, 75 y.o.   MRN: [id number]  This visit occurred during the SARS-CoV-2 public health emergency.  Safety protocols were in place, including screening questions prior to the visit, additional usage of staff PPE, and extensive cleaning of exam room while observing appropriate contact time as indicated for disinfecting solutions.     HPI The patient is here for follow up of their chronic medical problems, including htn, hld, prediabetes, gout, gerd, hypothyroid, COPD, ckd, chronic back/left leg pain, morbid obesity   She has cut down on her mac and cheese and potatoes.  She is trying to eat less sugars and carbohydrates.  Medications and allergies reviewed with patient and updated if appropriate.  Patient Active Problem List   Diagnosis Date Noted   CKD (chronic kidney disease) stage 3, GFR 30-59 ml/min (HCC) 02/18/2021   COPD exacerbation ([place]) 11/10/2020   Acute bacterial bronchitis 11/10/2020   COPD (chronic obstructive pulmonary disease) ([place]) 10/28/2020   Chronic heart failure with preserved ejection fraction (HFpEF) ([place]) 10/28/2020   Dyspnea 10/28/2020   Pneumonia due to COVID-19 virus 10/27/2020   Atherosclerosis of aorta ([place]) 12/31/2019   Physical deconditioning 08/18/2018   Bilateral leg weakness 02/15/2018   Prediabetes 08/10/2017   Hypertensive 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 ([place]) 04/26/2017   Gout 04/10/2017   Hypothyroidism 02/09/2017   Morbid obesity ([place]) 10/15/2015   Pulmonary emphysema ([place]) 04/29/2015   Lumbar radiculopathy 12/24/2014   Hx of tobacco use, presenting hazards to health 09/29/2014   Nail fungus 03/11/2014   FALSE POSITIVE NUCLEAR STRESS CHEST 01/28/2014   Chest pain on exertion 01/14/2014   GERD (gastroesophageal reflux disease) 05/15/2012   Positional vertigo 12/22/2011   Irregular heart beat 09/29/2011   Hyperlipidemia with target LDL  less than 100 09/29/2011   SOB (shortness of breath) 08/23/2011   Essential hypertension 05/28/2008   OSTEOARTHRITIS 05/28/2008    Current Outpatient Medications on File Prior to Visit  Medication Sig Dispense Refill   acetaminophen (TYLENOL) 325 MG tablet Take 2 tablets (650 mg total) by mouth every 6 (six) hours as needed for mild pain (or Fever >/= 101).     albuterol (VENTOLIN HFA) 108 (90 Base) MCG/ACT inhaler Inhale 2 puffs into the lungs every 6 (six) hours as needed for wheezing or shortness of breath.     allopurinol (ZYLOPRIM) 300 MG tablet TAKE 1 TABLET BY MOUTH  DAILY 90 tablet 3   atorvastatin (LIPITOR) 20 MG tablet TAKE 1 TABLET BY MOUTH  DAILY 90 tablet 3   carvedilol (COREG) 12.5 MG tablet TAKE 1 AND 1/2 TABLETS BY  MOUTH TWICE DAILY WITH A  MEAL (Patient taking differently: Take 18.75 mg by mouth 2 (two) times daily with a meal.) 270 tablet 3   furosemide (LASIX) 20 MG tablet Take 1 tablet (20 mg total) by mouth daily as needed. (Patient taking differently: Take 20 mg by mouth daily as needed for fluid.) 45 tablet 3   gabapentin (NEURONTIN) 100 MG capsule TAKE 1 CAPSULE BY MOUTH IN  THE MORNING AND 2 CAPSULES  BY MOUTH AT BEDTIME 270 capsule 3   Glycopyrrolate-Formoterol (BEVESPI AEROSPHERE) 9-4.8 MCG/ACT AERO Inhale 1 Act into the lungs daily. 10.7 g 11   levothyroxine (SYNTHROID) 50 MCG tablet Take 1 tablet by mouth once daily 90 tablet 0   lisinopril-hydrochlorothiazide (ZESTORETIC) 20-25 MG tablet TAKE 1 TABLET BY MOUTH  DAILY 90 tablet  3   naproxen sodium (ALEVE) 220 MG tablet Take 220 mg by mouth daily as needed (pain).     omeprazole (PRILOSEC) 20 MG capsule TAKE 1 CAPSULE BY MOUTH  TWICE DAILY BEFORE A MEAL 180 capsule 3   No current facility-administered medications on file prior to visit.    Past Medical History:  Diagnosis Date   Anemia    Anxiety    BRBPR (bright red blood per rectum) 02/05/2015   Coronary artery disease (CAD) excluded 01/2014   Low Risk  Myoview (false positive) with possible inferior-inferolateral ischemia, but with nonsustained VT --> CATH --> Angiographically Normal Coronary Arteries; coronary CT angiogram showed mild approximately disease but otherwise no obstructive disease.  Coronary calcium score 40.   Depression    FALSE POSITIVE NUCLEAR STRESS CHEST 01/28/2014   Fungal dermatitis 09/29/2011   GERD (gastroesophageal reflux disease)    Gout 04/10/2017   First episode - Left foot 03/2017   Hyperlipidemia    Hyperlipidemia with target LDL less than 100 09/29/2011   Hyperplastic colon polyp    Hypertension    Hypertensive 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 ([place]) 04/26/2017   HYPERTRIGLYCERIDEMIA 07/12/2008   Qualifier: Diagnosis of  By: Birdie Riddle MD, Katherine     Hypothyroidism 02/09/2017   Irregular heart beat 09/29/2011   Lumbar radiculopathy 12/24/2014   Morbid obesity ([place]) 10/15/2015   NICM (nonischemic cardiomyopathy) ([place])    Essentially resolved. Echocardiogram July 2013 showed normal EF greater than 55%. Important septal motion. Normal LV filling pressures. Mild MR and mild anterior MVP   Obesity (BMI 30-39.9)    Osteoarthritis    Positional vertigo    Prediabetes 08/10/2017   Pulmonary emphysema ([place]) 04/29/2015   PFTs 2017 - moderate-severe obstructive disease, some restrictive disease   SOB (shortness of breath) 08/23/2011    Past Surgical History:  Procedure Laterality Date   ABDOMINAL HYSTERECTOMY     APPENDECTOMY  as child   BREAST BIOPSY Right    benign   CHOLECYSTECTOMY     COLONOSCOPY     CORONARY CALCIUM SCORE/CT ANGIOGRAM  07/2017   Calcium score 40.  Mild LAD stenosis, but no other significant disease.   DOPPLER ECHOCARDIOGRAPHY  July 2013   09/20/11. normal Lv thickness and function with EF greater thatn 55%   Exercise tolerance test - CPET-MET-TEST  July 2013   Submaximal effort. Only 80% of her, therefore peak VO2 of 75% is likely an underestimate. High resting heart  rate, achieving 86% of heart rate. --> Unable to interpret due to lack of effort.   GASTRIC BYPASS  1980   GASTRIC BYPASS     KIDNEY STONE SURGERY     LEFT HEART CATHETERIZATION WITH CORONARY ANGIOGRAM N/A 02/01/2014   Procedure: LEFT HEART CATHETERIZATION WITH CORONARY ANGIOGRAM;  Surgeon: Leonie Man, MD;  Location: [facility] CATH LAB;  Service: Cardiovascular: Angiographically normal coronaries   NM MYOVIEW LTD  November 2015   4:20 min, 4.6 METS --> DOE, but no chest discomfort; Significant + ST -T wave changes noted with NSVT & PVCs. Images suggest Inferior-inferolateral ischemia.  Low Risk. -- FALSE POSITIVE   Pulmonary Function Tests  July 2013   Increased densities, decreased FVC - consistent with obesity hypoventilation; FEV1 is 58%, FVC 60% of predicted.   TRANSTHORACIC ECHOCARDIOGRAM  04/2017   Mildly reduced EF of 45%.  No regional wall motion normality.  GR 1 DD   UPPER GASTROINTESTINAL ENDOSCOPY      Social History  Socioeconomic History   Marital status: Married    Spouse name: Not on file   Number of children: 3   Years of education: Not on file   Highest education level: Not on file  Occupational History   Occupation: Retired  Tobacco Use   Smoking status: Former    Packs/day: 1.00    Years: 30.00    Pack years: 30.00    Types: Cigarettes    Quit date: 01/18/2003    Years since quitting: 18.1   Smokeless tobacco: Never  Substance and Sexual Activity   Alcohol use: Yes    Alcohol/week: 3.0 standard drinks    Types: 3 Cans of beer per week    Comment: drinks 2-3 beers a week   Drug use: No   Sexual activity: Not on file  Other Topics Concern   Not on file  Social History Narrative   She is married to [name], also patient of Dr. Ellyn Hack.   Mother of 3, grandmother 78.   Quit smoking in 2005. Social alcohol.   No routine exercise.   Social Determinants of Health   Financial Resource Strain: Low Risk    Difficulty of Paying Living Expenses: Not hard  at all  Food Insecurity: No Food Insecurity   Worried About Charity fundraiser in the Last Year: Never true   [place] in the Last Year: Never true  Transportation Needs: No Transportation Needs   Lack of Transportation (Medical): No   Lack of Transportation (Non-Medical): No  Physical Activity: Sufficiently Active   Days of Exercise per Week: 5 days   Minutes of Exercise per Session: 30 min  Stress: No Stress Concern Present   Feeling of Stress : Not at all  Social Connections: Socially Isolated   Frequency of Communication with Friends and Family: More than three times a week   Frequency of Social Gatherings with Friends and Family: Once a week   Attends Religious Services: Never   Marine scientist or Organizations: No   Attends Archivist Meetings: Never   Marital Status: Widowed    Family History  Problem Relation Age of Onset   Kidney disease Daughter    Multiple sclerosis Daughter    Heart disease Father    Heart disease Mother    Thyroid disease Mother    Heart disease Paternal Grandfather    Colon cancer Other        early 58's.  Genetic testing from maternal side of family.   Colon cancer Maternal Aunt    Breast cancer Maternal Aunt    Lung cancer Sister        smoker   Diabetes Sister    Colon cancer Maternal Uncle    Diabetes Daughter        x3   Heart disease Maternal Aunt    Colon polyps Neg Hx    Esophageal cancer Neg Hx    Gallbladder disease Neg Hx    Rectal cancer Neg Hx    Stomach cancer Neg Hx     Review of Systems  Constitutional:  Negative for chills and fever.  Respiratory:  Positive for cough and shortness of breath (with exertion). Negative for wheezing.   Cardiovascular:  Positive for palpitations (chronic - no change). Negative for chest pain and leg swelling.  Skin:        Hair loss  Neurological:  Positive for light-headedness (with getting up fast) and headaches (sinus related).  Objective:   Vitals:    02/19/21 0819  BP: 120/74  Pulse: (!) 56  Temp: 98 F (36.7 C)  SpO2: 95%   BP Readings from Last 3 Encounters:  02/19/21 120/74  11/10/20 112/76  11/01/20 135/74   Wt Readings from Last 3 Encounters:  02/19/21 236 lb (107 kg)  11/10/20 233 lb (105.7 kg)  11/01/20 246 lb 7.6 oz (111.8 kg)   Body mass index is 39.27 kg/m.   Physical Exam    Constitutional: Appears well-developed and well-nourished. No distress.  HENT:  Head: Normocephalic and atraumatic.  Neck: Neck supple. No tracheal deviation present. No thyromegaly present.  No cervical lymphadenopathy Cardiovascular: Normal rate, regular rhythm and normal heart sounds.  No murmur heard. No carotid bruit .  No edema Pulmonary/Chest: Effort normal and breath sounds normal. No respiratory distress. No rales.  Expiratory wheezing.   Skin: Skin is warm and dry. Not diaphoretic.  Psychiatric: Normal mood and affect. Behavior is normal.      Assessment & Plan:    See Problem List for Assessment and Plan of chronic medical problems.

## 2021-02-19 ENCOUNTER — Ambulatory Visit (INDEPENDENT_AMBULATORY_CARE_PROVIDER_SITE_OTHER): Payer: Medicare Other | Admitting: Internal Medicine

## 2021-02-19 ENCOUNTER — Other Ambulatory Visit: Payer: Self-pay | Admitting: Acute Care

## 2021-02-19 ENCOUNTER — Encounter: Payer: Self-pay | Admitting: Internal Medicine

## 2021-02-19 ENCOUNTER — Other Ambulatory Visit: Payer: Self-pay

## 2021-02-19 VITALS — BP 120/74 | HR 56 | Temp 98.0°F | Ht 65.0 in | Wt 236.0 lb

## 2021-02-19 DIAGNOSIS — R202 Paresthesia of skin: Secondary | ICD-10-CM | POA: Insufficient documentation

## 2021-02-19 DIAGNOSIS — I10 Essential (primary) hypertension: Secondary | ICD-10-CM | POA: Diagnosis not present

## 2021-02-19 DIAGNOSIS — R7303 Prediabetes: Secondary | ICD-10-CM

## 2021-02-19 DIAGNOSIS — M5416 Radiculopathy, lumbar region: Secondary | ICD-10-CM | POA: Diagnosis not present

## 2021-02-19 DIAGNOSIS — N1832 Chronic kidney disease, stage 3b: Secondary | ICD-10-CM | POA: Diagnosis not present

## 2021-02-19 DIAGNOSIS — L659 Nonscarring hair loss, unspecified: Secondary | ICD-10-CM | POA: Insufficient documentation

## 2021-02-19 DIAGNOSIS — E785 Hyperlipidemia, unspecified: Secondary | ICD-10-CM

## 2021-02-19 DIAGNOSIS — K219 Gastro-esophageal reflux disease without esophagitis: Secondary | ICD-10-CM | POA: Diagnosis not present

## 2021-02-19 DIAGNOSIS — E039 Hypothyroidism, unspecified: Secondary | ICD-10-CM

## 2021-02-19 DIAGNOSIS — M109 Gout, unspecified: Secondary | ICD-10-CM | POA: Diagnosis not present

## 2021-02-19 DIAGNOSIS — Z87891 Personal history of nicotine dependence: Secondary | ICD-10-CM

## 2021-02-19 DIAGNOSIS — E119 Type 2 diabetes mellitus without complications: Secondary | ICD-10-CM | POA: Diagnosis not present

## 2021-02-19 LAB — LIPID PANEL
Cholesterol: 171 mg/dL (ref 0–200)
HDL: 58.7 mg/dL (ref >39.00)
LDL Cholesterol: 78 mg/dL (ref 0–99)
NonHDL: 112.17
Total CHOL/HDL Ratio: 3
Triglycerides: 171 mg/dL — ABNORMAL HIGH (ref 0.0–149.0)
VLDL: 34.2 mg/dL (ref 0.0–40.0)

## 2021-02-19 LAB — COMPREHENSIVE METABOLIC PANEL
ALT: 13 U/L (ref 0–35)
AST: 20 U/L (ref 0–37)
Albumin: 4.2 g/dL (ref 3.5–5.2)
Alkaline Phosphatase: 73 U/L (ref 39–117)
BUN: 22 mg/dL (ref 6–23)
CO2: 28 mEq/L (ref 19–32)
Calcium: 9.3 mg/dL (ref 8.4–10.5)
Chloride: 104 mEq/L (ref 96–112)
Creatinine, Ser: 0.77 mg/dL (ref 0.40–1.20)
GFR: 75.94 mL/min (ref >60.00)
Glucose, Bld: 100 mg/dL — ABNORMAL HIGH (ref 70–99)
Potassium: 3.9 mEq/L (ref 3.5–5.1)
Sodium: 140 mEq/L (ref 135–145)
Total Bilirubin: 0.5 mg/dL (ref 0.2–1.2)
Total Protein: 6.9 g/dL (ref 6.0–8.3)

## 2021-02-19 LAB — CBC WITH DIFFERENTIAL/PLATELET
Basophils Absolute: 0 10*3/uL (ref 0.0–0.1)
Basophils Relative: 0.9 % (ref 0.0–3.0)
Eosinophils Absolute: 0.2 10*3/uL (ref 0.0–0.7)
Eosinophils Relative: 4.2 % (ref 0.0–5.0)
HCT: 38 % (ref 36.0–46.0)
Hemoglobin: 12.7 g/dL (ref 12.0–15.0)
Lymphocytes Relative: 28 % (ref 12.0–46.0)
Lymphs Abs: 1.3 10*3/uL (ref 0.7–4.0)
MCHC: 33.4 g/dL (ref 30.0–36.0)
MCV: 98.3 fl (ref 78.0–100.0)
Monocytes Absolute: 0.4 10*3/uL (ref 0.1–1.0)
Monocytes Relative: 8.4 % (ref 3.0–12.0)
Neutro Abs: 2.7 10*3/uL (ref 1.4–7.7)
Neutrophils Relative %: 58.5 % (ref 43.0–77.0)
Platelets: 158 10*3/uL (ref 150.0–400.0)
RBC: 3.87 Mil/uL (ref 3.87–5.11)
RDW: 14.9 % (ref 11.5–15.5)
WBC: 4.6 10*3/uL (ref 4.0–10.5)

## 2021-02-19 LAB — VITAMIN B12: Vitamin B-12: 232 pg/mL (ref 211–911)

## 2021-02-19 LAB — TSH: TSH: 4.51 u[IU]/mL (ref 0.35–5.50)

## 2021-02-19 LAB — HEMOGLOBIN A1C: Hgb A1c MFr Bld: 6 % (ref 4.6–6.5)

## 2021-02-19 MED ORDER — RYBELSUS 3 MG PO TABS: 3.0000 mg | ORAL_TABLET | Freq: Every day | ORAL | 5 refills | Status: DC

## 2021-02-19 NOTE — Assessment & Plan Note (Signed)
occ Ck B12

## 2021-02-19 NOTE — Assessment & Plan Note (Signed)
Chronic Limited in her ability to exercise secondary to chronic back pain Discussed decrease portions, healthy diet

## 2021-02-19 NOTE — Assessment & Plan Note (Signed)
Chronic Blood pressure well controlled CMP Continue Coreg 18.75 twice daily, lisinopril-hydrochlorothiazide 20-25 mg daily

## 2021-02-19 NOTE — Assessment & Plan Note (Signed)
Chronic CMP 

## 2021-02-19 NOTE — Assessment & Plan Note (Signed)
Chronic She is limited in how long she can stand and walk Continue gabapentin 100 mg in a.m., 200 mg at bedtime

## 2021-02-19 NOTE — Assessment & Plan Note (Signed)
Chronic  Clinically euthyroid Currently taking levothyroxine 50 mcg daily Check tsh  Titrate med dose if needed  

## 2021-02-19 NOTE — Assessment & Plan Note (Addendum)
Chronic Start rybelsus 3 mg daily-hopefully that will help her lose weight as well, which will help many other things as well as keep her sugars controlled Check a1c Low sugar / carb diet Stressed regular exercise

## 2021-02-19 NOTE — Assessment & Plan Note (Signed)
Chronic GERD controlled Continue omeprazole 20 mg twice daily 

## 2021-02-19 NOTE — Assessment & Plan Note (Signed)
Chronic Denies flares since her last visit Continue allopurinol 300 mg daily

## 2021-02-19 NOTE — Assessment & Plan Note (Signed)
New Has had hair loss since having had covid in august - does not seem to be improving Will ck B12,iron levels

## 2021-02-19 NOTE — Assessment & Plan Note (Signed)
Chronic °Regular exercise and healthy diet encouraged °Check lipid panel  °Continue atorvastatin 20 mg daily °

## 2021-02-20 LAB — IRON,TIBC AND FERRITIN PANEL
%SAT: 24 % (calc) (ref 16–45)
Ferritin: 20 ng/mL (ref 16–288)
Iron: 111 ug/dL (ref 45–160)
TIBC: 468 mcg/dL (calc) — ABNORMAL HIGH (ref 250–450)

## 2021-02-22 ENCOUNTER — Encounter: Payer: Self-pay | Admitting: Internal Medicine

## 2021-02-22 DIAGNOSIS — E538 Deficiency of other specified B group vitamins: Secondary | ICD-10-CM | POA: Insufficient documentation

## 2021-03-14 ENCOUNTER — Emergency Department (HOSPITAL_BASED_OUTPATIENT_CLINIC_OR_DEPARTMENT_OTHER): Payer: Medicare Other

## 2021-03-14 ENCOUNTER — Encounter (HOSPITAL_BASED_OUTPATIENT_CLINIC_OR_DEPARTMENT_OTHER): Payer: Self-pay | Admitting: Emergency Medicine

## 2021-03-14 ENCOUNTER — Other Ambulatory Visit: Payer: Self-pay

## 2021-03-14 ENCOUNTER — Emergency Department (HOSPITAL_BASED_OUTPATIENT_CLINIC_OR_DEPARTMENT_OTHER)
Admission: EM | Admit: 2021-03-14 | Discharge: 2021-03-14 | Disposition: A | Discharge status: Home / Self Care | Payer: Medicare Other | Attending: Emergency Medicine | Admitting: Emergency Medicine

## 2021-03-14 DIAGNOSIS — E039 Hypothyroidism, unspecified: Secondary | ICD-10-CM | POA: Diagnosis not present

## 2021-03-14 DIAGNOSIS — I2510 Atherosclerotic heart disease of native coronary artery without angina pectoris: Secondary | ICD-10-CM | POA: Insufficient documentation

## 2021-03-14 DIAGNOSIS — Z87891 Personal history of nicotine dependence: Secondary | ICD-10-CM | POA: Insufficient documentation

## 2021-03-14 DIAGNOSIS — U071 COVID-19: Secondary | ICD-10-CM | POA: Diagnosis not present

## 2021-03-14 DIAGNOSIS — Z79899 Other long term (current) drug therapy: Secondary | ICD-10-CM | POA: Insufficient documentation

## 2021-03-14 DIAGNOSIS — R0602 Shortness of breath: Secondary | ICD-10-CM | POA: Diagnosis not present

## 2021-03-14 DIAGNOSIS — Z7951 Long term (current) use of inhaled steroids: Secondary | ICD-10-CM | POA: Insufficient documentation

## 2021-03-14 DIAGNOSIS — R059 Cough, unspecified: Secondary | ICD-10-CM | POA: Diagnosis not present

## 2021-03-14 DIAGNOSIS — Z7984 Long term (current) use of oral hypoglycemic drugs: Secondary | ICD-10-CM | POA: Insufficient documentation

## 2021-03-14 DIAGNOSIS — J441 Chronic obstructive pulmonary disease with (acute) exacerbation: Secondary | ICD-10-CM | POA: Diagnosis not present

## 2021-03-14 DIAGNOSIS — Z20822 Contact with and (suspected) exposure to covid-19: Secondary | ICD-10-CM | POA: Insufficient documentation

## 2021-03-14 DIAGNOSIS — I130 Hypertensive heart and chronic kidney disease with heart failure and stage 1 through stage 4 chronic kidney disease, or unspecified chronic kidney disease: Secondary | ICD-10-CM | POA: Insufficient documentation

## 2021-03-14 DIAGNOSIS — I5032 Chronic diastolic (congestive) heart failure: Secondary | ICD-10-CM | POA: Diagnosis not present

## 2021-03-14 DIAGNOSIS — N183 Chronic kidney disease, stage 3 unspecified: Secondary | ICD-10-CM | POA: Insufficient documentation

## 2021-03-14 DIAGNOSIS — Z8616 Personal history of COVID-19: Secondary | ICD-10-CM | POA: Insufficient documentation

## 2021-03-14 DIAGNOSIS — E1122 Type 2 diabetes mellitus with diabetic chronic kidney disease: Secondary | ICD-10-CM | POA: Insufficient documentation

## 2021-03-14 LAB — CBC WITH DIFFERENTIAL/PLATELET
Abs Immature Granulocytes: 0.08 10*3/uL — ABNORMAL HIGH (ref 0.00–0.07)
Basophils Absolute: 0 10*3/uL (ref 0.0–0.1)
Basophils Relative: 0 %
Eosinophils Absolute: 0.1 10*3/uL (ref 0.0–0.5)
Eosinophils Relative: 1 %
HCT: 33.7 % — ABNORMAL LOW (ref 36.0–46.0)
Hemoglobin: 11.4 g/dL — ABNORMAL LOW (ref 12.0–15.0)
Immature Granulocytes: 1 %
Lymphocytes Relative: 12 %
Lymphs Abs: 1.1 10*3/uL (ref 0.7–4.0)
MCH: 32.3 pg (ref 26.0–34.0)
MCHC: 33.8 g/dL (ref 30.0–36.0)
MCV: 95.5 fL (ref 80.0–100.0)
Monocytes Absolute: 0.9 10*3/uL (ref 0.1–1.0)
Monocytes Relative: 9 %
Neutro Abs: 7.6 10*3/uL (ref 1.7–7.7)
Neutrophils Relative %: 77 %
Platelets: 218 10*3/uL (ref 150–400)
RBC: 3.53 MIL/uL — ABNORMAL LOW (ref 3.87–5.11)
RDW: 13.9 % (ref 11.5–15.5)
WBC: 9.9 10*3/uL (ref 4.0–10.5)
nRBC: 0 % (ref 0.0–0.2)

## 2021-03-14 LAB — RESP PANEL BY RT-PCR (FLU A&B, COVID) ARPGX2
Influenza A by PCR: NEGATIVE
Influenza B by PCR: NEGATIVE
SARS Coronavirus 2 by RT PCR: NEGATIVE

## 2021-03-14 LAB — BASIC METABOLIC PANEL
Anion gap: 14 (ref 5–15)
BUN: 27 mg/dL — ABNORMAL HIGH (ref 8–23)
CO2: 22 mmol/L (ref 22–32)
Calcium: 8.8 mg/dL — ABNORMAL LOW (ref 8.9–10.3)
Chloride: 101 mmol/L (ref 98–111)
Creatinine, Ser: 1.19 mg/dL — ABNORMAL HIGH (ref 0.44–1.00)
GFR, Estimated: 48 mL/min — ABNORMAL LOW (ref >60)
Glucose, Bld: 115 mg/dL — ABNORMAL HIGH (ref 70–99)
Potassium: 3.7 mmol/L (ref 3.5–5.1)
Sodium: 137 mmol/L (ref 135–145)

## 2021-03-14 LAB — BRAIN NATRIURETIC PEPTIDE: B Natriuretic Peptide: 150.6 pg/mL — ABNORMAL HIGH (ref 0.0–100.0)

## 2021-03-14 MED ORDER — METHYLPREDNISOLONE SODIUM SUCC 125 MG IJ SOLR
125.0000 mg | Freq: Once | INTRAMUSCULAR | Status: AC
  Administered 2021-03-14: 125 mg via INTRAVENOUS
  Filled 2021-03-14: qty 2

## 2021-03-14 MED ORDER — MAGNESIUM SULFATE 2 GM/50ML IV SOLN
2.0000 g | Freq: Once | INTRAVENOUS | Status: AC
  Administered 2021-03-14: 2 g via INTRAVENOUS
  Filled 2021-03-14: qty 50

## 2021-03-14 MED ORDER — ALBUTEROL SULFATE (2.5 MG/3ML) 0.083% IN NEBU
5.0000 mg | INHALATION_SOLUTION | Freq: Once | RESPIRATORY_TRACT | Status: AC
  Administered 2021-03-14: 5 mg via RESPIRATORY_TRACT
  Filled 2021-03-14: qty 6

## 2021-03-14 MED ORDER — IPRATROPIUM BROMIDE 0.02 % IN SOLN
0.5000 mg | Freq: Once | RESPIRATORY_TRACT | Status: AC
  Administered 2021-03-14: 0.5 mg via RESPIRATORY_TRACT
  Filled 2021-03-14: qty 2.5

## 2021-03-14 MED ORDER — METHYLPREDNISOLONE 4 MG PO TBPK: ORAL_TABLET | ORAL | 0 refills | Status: DC

## 2021-03-14 MED ORDER — ALBUTEROL SULFATE HFA 108 (90 BASE) MCG/ACT IN AERS
2.0000 | INHALATION_SPRAY | RESPIRATORY_TRACT | Status: DC | PRN
  Filled 2021-03-14: qty 6.7

## 2021-03-14 MED ORDER — AEROCHAMBER PLUS FLO-VU MEDIUM MISC: 1.0000 | Freq: Once | Status: DC

## 2021-03-14 NOTE — ED Triage Notes (Signed)
Pt reports tested positive for COVID this morning and is having difficulty breathing.

## 2021-03-14 NOTE — Discharge Instructions (Addendum)
If you begin feeling short of breath like he did today, you may use up to 4 puffs of your albuterol inhaler at one time.   Get help right away if: You have shortness of breath while you are resting. You have shortness of breath that prevents you from: Being able to talk. Performing your usual physical activities. You have chest pain lasting longer than 5 minutes. Your skin color is more blue (cyanotic) than usual. You measure low oxygen saturations for longer than 5 minutes with a pulse oximeter. You have a fever. You feel too tired to breathe normally.

## 2021-03-14 NOTE — Progress Notes (Signed)
Ambulating on ra the Pt's O2 SAT was 94%

## 2021-03-14 NOTE — ED Provider Notes (Signed)
[facility] Provider Note   CSN: [id number] Arrival date & time: 03/14/21  1231     History Chief Complaint  Patient presents with   Shortness of Breath    April Combs is a 75 y.o. female.  The past medical history of CAD, hyperlipidemia, hypertension, diastolic CHF, COPD.  She has had 6 days of URI symptoms.  For the last 24 hours she has had progressive worsening shortness of breath.  She is better at rest, worse with exertion.  She states that she gets extremely winded when she is walking or exerting herself.  She feels like it is her COPD and has been using her inhaler more at home without significant improvement in her symptoms.  She does not use supplemental oxygen.  She denies hemoptysis, weight gain or swelling in her lower extremities.    Shortness of Breath     Past Medical History:  Diagnosis Date   Anemia    Anxiety    BRBPR (bright red blood per rectum) 02/05/2015   Coronary artery disease (CAD) excluded 01/2014   Low Risk Myoview (false positive) with possible inferior-inferolateral ischemia, but with nonsustained VT --> CATH --> Angiographically Normal Coronary Arteries; coronary CT angiogram showed mild approximately disease but otherwise no obstructive disease.  Coronary calcium score 40.   Depression    FALSE POSITIVE NUCLEAR STRESS CHEST 01/28/2014   Fungal dermatitis 09/29/2011   GERD (gastroesophageal reflux disease)    Gout 04/10/2017   First episode - Left foot 03/2017   Hyperlipidemia    Hyperlipidemia with target LDL less than 100 09/29/2011   Hyperplastic colon polyp    Hypertension    Hypertensive 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 ([place]) 04/26/2017   HYPERTRIGLYCERIDEMIA 07/12/2008   Qualifier: Diagnosis of  By: Birdie Riddle MD, Katherine     Hypothyroidism 02/09/2017   Irregular heart beat 09/29/2011   Lumbar radiculopathy 12/24/2014   Morbid obesity ([place]) 10/15/2015   NICM (nonischemic  cardiomyopathy) ([place])    Essentially resolved. Echocardiogram July 2013 showed normal EF greater than 55%. Important septal motion. Normal LV filling pressures. Mild MR and mild anterior MVP   Obesity (BMI 30-39.9)    Osteoarthritis    Positional vertigo    Prediabetes 08/10/2017   Pulmonary emphysema ([place]) 04/29/2015   PFTs 2017 - moderate-severe obstructive disease, some restrictive disease   SOB (shortness of breath) 08/23/2011    Patient Active Problem List   Diagnosis Date Noted   B12 deficiency 02/22/2021   Hair loss 02/19/2021   Tingling in extremities 02/19/2021   CKD (chronic kidney disease) stage 3, GFR 30-59 ml/min ([place]) 02/18/2021   COPD exacerbation ([place]) 11/10/2020   Acute bacterial bronchitis 11/10/2020   COPD (chronic obstructive pulmonary disease) ([place]) 10/28/2020   Chronic heart failure with preserved ejection fraction (HFpEF) ([place]) 10/28/2020   Dyspnea 10/28/2020   Pneumonia due to COVID-19 virus 10/27/2020   Atherosclerosis of aorta ([place]) 12/31/2019   Physical deconditioning 08/18/2018   Bilateral leg weakness 02/15/2018   Diabetes ([place]) 08/10/2017   Hypertensive 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 ([place]) 04/26/2017   Gout 04/10/2017   Hypothyroidism 02/09/2017   Morbid obesity ([place]) 10/15/2015   Pulmonary emphysema ([place]) 04/29/2015   Lumbar radiculopathy 12/24/2014   Hx of tobacco use, presenting hazards to health 09/29/2014   Nail fungus 03/11/2014   FALSE POSITIVE NUCLEAR STRESS CHEST 01/28/2014   Chest pain on exertion 01/14/2014   GERD (gastroesophageal reflux disease) 05/15/2012  Positional vertigo 12/22/2011   Irregular heart beat 09/29/2011   Hyperlipidemia with target LDL less than 100 09/29/2011   SOB (shortness of breath) 08/23/2011   Essential hypertension 05/28/2008   OSTEOARTHRITIS 05/28/2008    Past Surgical History:  Procedure Laterality Date   ABDOMINAL HYSTERECTOMY     APPENDECTOMY  as child   BREAST BIOPSY  Right    benign   CHOLECYSTECTOMY     COLONOSCOPY     CORONARY CALCIUM SCORE/CT ANGIOGRAM  07/2017   Calcium score 40.  Mild LAD stenosis, but no other significant disease.   DOPPLER ECHOCARDIOGRAPHY  July 2013   09/20/11. normal Lv thickness and function with EF greater thatn 55%   Exercise tolerance test - CPET-MET-TEST  July 2013   Submaximal effort. Only 80% of her, therefore peak VO2 of 75% is likely an underestimate. High resting heart rate, achieving 86% of heart rate. --> Unable to interpret due to lack of effort.   GASTRIC BYPASS  1980   GASTRIC BYPASS     KIDNEY STONE SURGERY     LEFT HEART CATHETERIZATION WITH CORONARY ANGIOGRAM N/A 02/01/2014   Procedure: LEFT HEART CATHETERIZATION WITH CORONARY ANGIOGRAM;  Surgeon: Leonie Man, MD;  Location: [facility] CATH LAB;  Service: Cardiovascular: Angiographically normal coronaries   NM MYOVIEW LTD  November 2015   4:20 min, 4.6 METS --> DOE, but no chest discomfort; Significant + ST -T wave changes noted with NSVT & PVCs. Images suggest Inferior-inferolateral ischemia.  Low Risk. -- FALSE POSITIVE   Pulmonary Function Tests  July 2013   Increased densities, decreased FVC - consistent with obesity hypoventilation; FEV1 is 58%, FVC 60% of predicted.   TRANSTHORACIC ECHOCARDIOGRAM  04/2017   Mildly reduced EF of 45%.  No regional wall motion normality.  GR 1 DD   UPPER GASTROINTESTINAL ENDOSCOPY       OB History   No obstetric history on file.     Family History  Problem Relation Age of Onset   Kidney disease Daughter    Multiple sclerosis Daughter    Heart disease Father    Heart disease Mother    Thyroid disease Mother    Heart disease Paternal Grandfather    Colon cancer Other        early 26's.  Genetic testing from maternal side of family.   Colon cancer Maternal Aunt    Breast cancer Maternal Aunt    Lung cancer Sister        smoker   Diabetes Sister    Colon cancer Maternal Uncle    Diabetes Daughter        x3    Heart disease Maternal Aunt    Colon polyps Neg Hx    Esophageal cancer Neg Hx    Gallbladder disease Neg Hx    Rectal cancer Neg Hx    Stomach cancer Neg Hx     Social History   Tobacco Use   Smoking status: Former    Packs/day: 1.00    Years: 30.00    Pack years: 30.00    Types: Cigarettes    Quit date: 01/18/2003    Years since quitting: 18.1   Smokeless tobacco: Never  Vaping Use   Vaping Use: Never used  Substance Use Topics   Alcohol use: Yes    Alcohol/week: 3.0 standard drinks    Types: 3 Cans of beer per week    Comment: drinks 2-3 beers a week   Drug use: No    Home  Medications Prior to Admission medications   Medication Sig Start Date End Date Taking? Authorizing Provider  acetaminophen (TYLENOL) 325 MG tablet Take 2 tablets (650 mg total) by mouth every 6 (six) hours as needed for mild pain (or Fever >/= 101). 11/01/20   Domenic Polite, MD  albuterol (VENTOLIN HFA) 108 (90 Base) MCG/ACT inhaler Inhale 2 puffs into the lungs every 6 (six) hours as needed for wheezing or shortness of breath.    [provider]  allopurinol (ZYLOPRIM) 300 MG tablet TAKE 1 TABLET BY MOUTH  DAILY 01/01/21   Binnie Rail, MD  atorvastatin (LIPITOR) 20 MG tablet TAKE 1 TABLET BY MOUTH  DAILY 01/20/21   Binnie Rail, MD  carvedilol (COREG) 12.5 MG tablet TAKE 1 AND 1/2 TABLETS BY  MOUTH TWICE DAILY WITH A  MEAL Patient taking differently: Take 18.75 mg by mouth 2 (two) times daily with a meal. 07/21/20   Leonie Man, MD  furosemide (LASIX) 20 MG tablet Take 1 tablet (20 mg total) by mouth daily as needed. Patient taking differently: Take 20 mg by mouth daily as needed for fluid. 09/29/20   Leonie Man, MD  gabapentin (NEURONTIN) 100 MG capsule TAKE 1 CAPSULE BY MOUTH IN  THE MORNING AND 2 CAPSULES  BY MOUTH AT BEDTIME 07/21/20   Burns, Claudina Lick, MD  Glycopyrrolate-Formoterol (BEVESPI AEROSPHERE) 9-4.8 MCG/ACT AERO Inhale 1 Act into the lungs daily. 11/10/20   Binnie Rail,  MD  levothyroxine (SYNTHROID) 50 MCG tablet Take 1 tablet by mouth once daily 01/05/21   Binnie Rail, MD  lisinopril-hydrochlorothiazide (ZESTORETIC) 20-25 MG tablet TAKE 1 TABLET BY MOUTH  DAILY 10/23/20   Leonie Man, MD  naproxen sodium (ALEVE) 220 MG tablet Take 220 mg by mouth daily as needed (pain).    [provider]  omeprazole (PRILOSEC) 20 MG capsule TAKE 1 CAPSULE BY MOUTH  TWICE DAILY BEFORE A MEAL 01/01/21   Burns, Claudina Lick, MD  Semaglutide (RYBELSUS) 3 MG TABS Take 3 mg by mouth daily. 02/19/21   Binnie Rail, MD    Allergies    Patient has no known allergies.  Review of Systems   Review of Systems  Respiratory:  Positive for shortness of breath.   Ten systems reviewed and are negative for acute change, except as noted in the HPI.   Physical Exam Updated Vital Signs BP 127/71 (BP Location: Right Arm)    Pulse 98    Temp 98.3 F (36.8 C) (Oral)    Resp (!) 22    Ht _0  (1.651 m)    Wt 106.6 kg    SpO2 98%    BMI 39.11 kg/m   Physical Exam Vitals and nursing note reviewed.  Constitutional:      General: She is not in acute distress.    Appearance: She is well-developed. She is not diaphoretic.  HENT:     Head: Normocephalic and atraumatic.     Right Ear: External ear normal.     Left Ear: External ear normal.     Nose: Nose normal.     Mouth/Throat:     Mouth: Mucous membranes are moist.  Eyes:     General: No scleral icterus.    Conjunctiva/sclera: Conjunctivae normal.  Cardiovascular:     Rate and Rhythm: Normal rate and regular rhythm.     Heart sounds: Normal heart sounds. No murmur heard.   No friction rub. No gallop.  Pulmonary:  Effort: Tachypnea and accessory muscle usage present. No respiratory distress.     Breath sounds: Examination of the right-upper field reveals wheezing. Examination of the left-upper field reveals wheezing. Examination of the right-middle field reveals wheezing. Examination of the left-middle field reveals  wheezing. Examination of the right-lower field reveals wheezing. Examination of the left-lower field reveals wheezing. Wheezing present. No rhonchi.  Abdominal:     General: Bowel sounds are normal. There is no distension.     Palpations: Abdomen is soft. There is no mass.     Tenderness: There is no abdominal tenderness. There is no guarding.  Musculoskeletal:     Cervical back: Normal range of motion.  Skin:    General: Skin is warm and dry.  Neurological:     Mental Status: She is alert and oriented to person, place, and time.  Psychiatric:        Behavior: Behavior normal.    ED Results / Procedures / Treatments   Labs (all labs ordered are listed, but only abnormal results are displayed) Labs Reviewed  RESP PANEL BY RT-PCR (FLU A&B, COVID) [id number]    EKG None  Radiology DG Chest 2 View  Result Date: 03/14/2021 CLINICAL DATA:  Cough and shortness of breath.  COVID positive. EXAM: CHEST - 2 VIEW COMPARISON:  Chest radiographs 10/27/2020 and CT 02/11/2021 FINDINGS: The cardiomediastinal silhouette is unchanged with normal heart size. Mild interstitial type opacities are present in the left greater than right lung bases, slightly greater than on the prior radiographs. No pleural effusion or pneumothorax is identified. Surgical clips are noted in the left upper abdomen. No acute osseous abnormality is seen. IMPRESSION: Mild bibasilar opacities which may reflect atelectasis or infection. Electronically Signed   By: Logan Bores M.D.   On: 03/14/2021 14:31    Procedures Procedures   Medications Ordered in ED Medications - No data to display  ED Course  I have reviewed the triage vital signs and the nursing notes.  Pertinent labs & imaging results that were available during my care of the patient were reviewed by me and considered in my medical decision making (see chart for details).  Clinical Course as of 03/17/21 2205  Sat Mar 14, 2021  1809 Patient states " I feel like a  new person, I feel like I could do anything." On repeat exam she has No wheezing and excellent air movement. Will ambulate with pulse ox. [AH]    Clinical Course User Index [AH] Margarita Mail, PA-C   MDM Rules/Calculators/A&P                         This is a 75 year old female here with complaint of shortness of breath. The emergent differential diagnosis for shortness of breath includes, but is not limited to, Pulmonary edema, bronchoconstriction, Pneumonia, Pulmonary embolism, Pneumotherax/ Hemothorax, Dysrythmia, ACS.   Having severe wheezing.  I ordered and reviewed labs include CBC without elevated white blood cell count, BNP only mildly elevated, BMP with baseline renal insufficiency.  Respiratory panel negative for COVID influenza .  I ordered and reviewed a 2 view chest x-ray which shows no acute abnormalities  Patient treated in the emergency department for COPD exacerbation.  She has no wheezing on examination.  She feels greatly improved.  She ambulated in the emergency department on room air with an oxygen saturation of 94%.  Patient will be discharged with albuterol and a Medrol Dosepak.  Seen and shared visit with Dr. Doren Custard.  Patient appears appropriate for discharge Final Clinical Impression(s) / ED Diagnoses Final diagnoses:  None    Rx / DC Orders ED Discharge Orders     None        Margarita Mail, PA-C 03/17/21 2208    Godfrey Pick, MD 03/18/21 [phone]

## 2021-03-14 NOTE — ED Notes (Signed)
Pt states she feels the same as when originally triaged..c/o increased shortness of breath when up and moving around.

## 2021-03-17 ENCOUNTER — Other Ambulatory Visit: Payer: Self-pay | Admitting: Internal Medicine

## 2021-03-17 MED ORDER — ALBUTEROL SULFATE (2.5 MG/3ML) 0.083% IN NEBU: 2.5000 mg | INHALATION_SOLUTION | Freq: Four times a day (QID) | RESPIRATORY_TRACT | 1 refills | Status: DC | PRN

## 2021-03-29 ENCOUNTER — Other Ambulatory Visit: Payer: Self-pay | Admitting: Internal Medicine

## 2021-04-07 ENCOUNTER — Encounter: Payer: Self-pay | Admitting: Internal Medicine

## 2021-04-07 ENCOUNTER — Telehealth: Payer: Medicare Other

## 2021-04-13 DIAGNOSIS — R197 Diarrhea, unspecified: Secondary | ICD-10-CM | POA: Insufficient documentation

## 2021-04-13 NOTE — Progress Notes (Signed)
Subjective:    Patient ID: April Combs, female    DOB: 16-Mar-1945, 76 y.o.   MRN: [id number]  This visit occurred during the SARS-CoV-2 public health emergency.  Safety protocols were in place, including screening questions prior to the visit, additional usage of staff PPE, and extensive cleaning of exam room while observing appropriate contact time as indicated for disinfecting solutions.    HPI The patient is here for an acute visit.  12/23 - was very nauseous x few days   12/24 started getting sick with cold symptoms  12/25 diarrhea started.  12/31 - ED - COPD exac.  Flu /covid eg. Had a breathing treatment in the emergency room, which helped a lot.  Prescribed medrol dose pak, albuterol neb.  Felt better for a day or 2 only  She is still coughing - sometimes productive.  She is SOB - more than usual.  She denies any wheezing or chest tightness.  She has decreased appetite, headaches and some lightheadedness and dizziness.  She denies any typical cold symptoms.  Using neb treatment twice daily.  She often uses the inhaler midday.  Her diarrhea is better, but still have frequent-soft or loose stools.  She is going 5-6 times a day now, but was going much more.  She stopped the Rybelsus 1/23  Medications and allergies reviewed with patient and updated if appropriate.  Patient Active Problem List   Diagnosis Date Noted   Diarrhea 04/13/2021   B12 deficiency 02/22/2021   Hair loss 02/19/2021   Tingling in extremities 02/19/2021   CKD (chronic kidney disease) stage 3, GFR 30-59 ml/min (HCC) 02/18/2021   COPD exacerbation ([place]) 11/10/2020   Acute bacterial bronchitis 11/10/2020   COPD (chronic obstructive pulmonary disease) ([place]) 10/28/2020   Chronic heart failure with preserved ejection fraction (HFpEF) ([place]) 10/28/2020   Dyspnea 10/28/2020   Pneumonia due to COVID-19 virus 10/27/2020   Atherosclerosis of aorta ([place]) 12/31/2019   Physical deconditioning 08/18/2018    Bilateral leg weakness 02/15/2018   Diabetes ([place]) 08/10/2017   Hypertensive 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 ([place]) 04/26/2017   Gout 04/10/2017   Hypothyroidism 02/09/2017   Morbid obesity ([place]) 10/15/2015   Pulmonary emphysema ([place]) 04/29/2015   Lumbar radiculopathy 12/24/2014   Hx of tobacco use, presenting hazards to health 09/29/2014   Nail fungus 03/11/2014   FALSE POSITIVE NUCLEAR STRESS CHEST 01/28/2014   Chest pain on exertion 01/14/2014   GERD (gastroesophageal reflux disease) 05/15/2012   Positional vertigo 12/22/2011   Irregular heart beat 09/29/2011   Hyperlipidemia with target LDL less than 100 09/29/2011   SOB (shortness of breath) 08/23/2011   Essential hypertension 05/28/2008   OSTEOARTHRITIS 05/28/2008    Current Outpatient Medications on File Prior to Visit  Medication Sig Dispense Refill   acetaminophen (TYLENOL) 325 MG tablet Take 2 tablets (650 mg total) by mouth every 6 (six) hours as needed for mild pain (or Fever >/= 101).     albuterol (PROVENTIL) (2.5 MG/3ML) 0.083% nebulizer solution Take 3 mLs (2.5 mg total) by nebulization every 6 (six) hours as needed for wheezing or shortness of breath. 150 mL 1   albuterol (VENTOLIN HFA) 108 (90 Base) MCG/ACT inhaler Inhale 2 puffs into the lungs every 6 (six) hours as needed for wheezing or shortness of breath.     allopurinol (ZYLOPRIM) 300 MG tablet TAKE 1 TABLET BY MOUTH  DAILY 90 tablet 3   atorvastatin (LIPITOR) 20 MG tablet TAKE 1 TABLET BY  MOUTH  DAILY 90 tablet 3   carvedilol (COREG) 12.5 MG tablet TAKE 1 AND 1/2 TABLETS BY  MOUTH TWICE DAILY WITH A  MEAL (Patient taking differently: Take 18.75 mg by mouth 2 (two) times daily with a meal.) 270 tablet 3   furosemide (LASIX) 20 MG tablet Take 1 tablet (20 mg total) by mouth daily as needed. (Patient taking differently: Take 20 mg by mouth daily as needed for fluid.) 45 tablet 3   gabapentin (NEURONTIN) 100 MG capsule TAKE 1 CAPSULE BY  MOUTH IN  THE MORNING AND 2 CAPSULES  BY MOUTH AT BEDTIME 270 capsule 3   Glycopyrrolate-Formoterol (BEVESPI AEROSPHERE) 9-4.8 MCG/ACT AERO Inhale 1 Act into the lungs daily. 10.7 g 11   levothyroxine (SYNTHROID) 50 MCG tablet Take 1 tablet by mouth once daily 90 tablet 0   lisinopril-hydrochlorothiazide (ZESTORETIC) 20-25 MG tablet TAKE 1 TABLET BY MOUTH  DAILY 90 tablet 3   naproxen sodium (ALEVE) 220 MG tablet Take 220 mg by mouth daily as needed (pain).     omeprazole (PRILOSEC) 20 MG capsule TAKE 1 CAPSULE BY MOUTH  TWICE DAILY BEFORE A MEAL 180 capsule 3   Semaglutide (RYBELSUS) 3 MG TABS Take 3 mg by mouth daily. 30 tablet 5   No current facility-administered medications on file prior to visit.    Past Medical History:  Diagnosis Date   Anemia    Anxiety    BRBPR (bright red blood per rectum) 02/05/2015   Coronary artery disease (CAD) excluded 01/2014   Low Risk Myoview (false positive) with possible inferior-inferolateral ischemia, but with nonsustained VT --> CATH --> Angiographically Normal Coronary Arteries; coronary CT angiogram showed mild approximately disease but otherwise no obstructive disease.  Coronary calcium score 40.   Depression    FALSE POSITIVE NUCLEAR STRESS CHEST 01/28/2014   Fungal dermatitis 09/29/2011   GERD (gastroesophageal reflux disease)    Gout 04/10/2017   First episode - Left foot 03/2017   Hyperlipidemia    Hyperlipidemia with target LDL less than 100 09/29/2011   Hyperplastic colon polyp    Hypertension    Hypertensive 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 ([place]) 04/26/2017   HYPERTRIGLYCERIDEMIA 07/12/2008   Qualifier: Diagnosis of  By: Birdie Riddle MD, Katherine     Hypothyroidism 02/09/2017   Irregular heart beat 09/29/2011   Lumbar radiculopathy 12/24/2014   Morbid obesity ([place]) 10/15/2015   NICM (nonischemic cardiomyopathy) ([place])    Essentially resolved. Echocardiogram July 2013 showed normal EF greater than 55%. Important septal  motion. Normal LV filling pressures. Mild MR and mild anterior MVP   Obesity (BMI 30-39.9)    Osteoarthritis    Positional vertigo    Prediabetes 08/10/2017   Pulmonary emphysema ([place]) 04/29/2015   PFTs 2017 - moderate-severe obstructive disease, some restrictive disease   SOB (shortness of breath) 08/23/2011    Past Surgical History:  Procedure Laterality Date   ABDOMINAL HYSTERECTOMY     APPENDECTOMY  as child   BREAST BIOPSY Right    benign   CHOLECYSTECTOMY     COLONOSCOPY     CORONARY CALCIUM SCORE/CT ANGIOGRAM  07/2017   Calcium score 40.  Mild LAD stenosis, but no other significant disease.   DOPPLER ECHOCARDIOGRAPHY  July 2013   09/20/11. normal Lv thickness and function with EF greater thatn 55%   Exercise tolerance test - CPET-MET-TEST  July 2013   Submaximal effort. Only 80% of her, therefore peak VO2 of 75% is likely an underestimate. High resting heart rate,  achieving 86% of heart rate. --> Unable to interpret due to lack of effort.   GASTRIC BYPASS  1980   GASTRIC BYPASS     KIDNEY STONE SURGERY     LEFT HEART CATHETERIZATION WITH CORONARY ANGIOGRAM N/A 02/01/2014   Procedure: LEFT HEART CATHETERIZATION WITH CORONARY ANGIOGRAM;  Surgeon: Leonie Man, MD;  Location: [facility] CATH LAB;  Service: Cardiovascular: Angiographically normal coronaries   NM MYOVIEW LTD  November 2015   4:20 min, 4.6 METS --> DOE, but no chest discomfort; Significant + ST -T wave changes noted with NSVT & PVCs. Images suggest Inferior-inferolateral ischemia.  Low Risk. -- FALSE POSITIVE   Pulmonary Function Tests  July 2013   Increased densities, decreased FVC - consistent with obesity hypoventilation; FEV1 is 58%, FVC 60% of predicted.   TRANSTHORACIC ECHOCARDIOGRAM  04/2017   Mildly reduced EF of 45%.  No regional wall motion normality.  GR 1 DD   UPPER GASTROINTESTINAL ENDOSCOPY      Social History   Socioeconomic History   Marital status: Married    Spouse name: Not on file   Number of  children: 3   Years of education: Not on file   Highest education level: Not on file  Occupational History   Occupation: Retired  Tobacco Use   Smoking status: Former    Packs/day: 1.00    Years: 30.00    Pack years: 30.00    Types: Cigarettes    Quit date: 01/18/2003    Years since quitting: 18.2   Smokeless tobacco: Never  Vaping Use   Vaping Use: Never used  Substance and Sexual Activity   Alcohol use: Yes    Alcohol/week: 3.0 standard drinks    Types: 3 Cans of beer per week    Comment: drinks 2-3 beers a week   Drug use: No   Sexual activity: Not on file  Other Topics Concern   Not on file  Social History Narrative   She is married to [name], also patient of Dr. Ellyn Hack.   Mother of 3, grandmother 83.   Quit smoking in 2005. Social alcohol.   No routine exercise.   Social Determinants of Health   Financial Resource Strain: Low Risk    Difficulty of Paying Living Expenses: Not hard at all  Food Insecurity: No Food Insecurity   Worried About Charity fundraiser in the Last Year: Never true   [place] in the Last Year: Never true  Transportation Needs: No Transportation Needs   Lack of Transportation (Medical): No   Lack of Transportation (Non-Medical): No  Physical Activity: Sufficiently Active   Days of Exercise per Week: 5 days   Minutes of Exercise per Session: 30 min  Stress: No Stress Concern Present   Feeling of Stress : Not at all  Social Connections: Socially Isolated   Frequency of Communication with Friends and Family: More than three times a week   Frequency of Social Gatherings with Friends and Family: Once a week   Attends Religious Services: Never   Marine scientist or Organizations: No   Attends Archivist Meetings: Never   Marital Status: Widowed    Family History  Problem Relation Age of Onset   Kidney disease Daughter    Multiple sclerosis Daughter    Heart disease Father    Heart disease Mother    Thyroid  disease Mother    Heart disease Paternal Grandfather    Colon cancer Other  early 38's.  Genetic testing from maternal side of family.   Colon cancer Maternal Aunt    Breast cancer Maternal Aunt    Lung cancer Sister        smoker   Diabetes Sister    Colon cancer Maternal Uncle    Diabetes Daughter        x3   Heart disease Maternal Aunt    Colon polyps Neg Hx    Esophageal cancer Neg Hx    Gallbladder disease Neg Hx    Rectal cancer Neg Hx    Stomach cancer Neg Hx     Review of Systems  Constitutional:  Positive for appetite change (decreased). Negative for fever.  HENT:  Negative for congestion, ear pain, sinus pain and sore throat.   Respiratory:  Positive for cough and shortness of breath. Negative for chest tightness and wheezing.   Cardiovascular:  Positive for chest pain (at times).  Gastrointestinal:  Positive for diarrhea (better  - 6 times now - was worse - starting to become more formed) and nausea (better but still has some). Negative for abdominal pain, blood in stool and vomiting.  Musculoskeletal:  Negative for myalgias.  Neurological:  Positive for dizziness, light-headedness and headaches.      Objective:   Vitals:   04/14/21 0758  BP: 106/78  Pulse: 70  Temp: 98 F (36.7 C)  SpO2: 97%   BP Readings from Last 3 Encounters:  04/14/21 106/78  03/14/21 114/67  02/19/21 120/74   Wt Readings from Last 3 Encounters:  04/14/21 219 lb (99.3 kg)  03/14/21 235 lb (106.6 kg)  02/19/21 236 lb (107 kg)   Body mass index is 36.44 kg/m.   Physical Exam    GENERAL APPEARANCE: Appears stated age, well appearing, NAD EYES: conjunctiva clear, no icterus HENT: bilateral tympanic membranes and ear canals normal, oropharynx with no erythema or exudates, trachea midline, no cervical or supraclavicular lymphadenopathy LUNGS: Unlabored breathing, good air entry bilaterally, clear to auscultation without wheeze or crackles CARDIOVASCULAR: Normal S1,S2 , no  edema Abdomen: Soft, nondistended, nontender SKIN: Warm, dry      Lab Results  Component Value Date   HGBA1C 6.0 02/19/2021     Assessment & Plan:    See Problem List for Assessment and Plan of chronic medical problems.

## 2021-04-14 ENCOUNTER — Ambulatory Visit (INDEPENDENT_AMBULATORY_CARE_PROVIDER_SITE_OTHER): Payer: Medicare Other | Admitting: Internal Medicine

## 2021-04-14 ENCOUNTER — Encounter: Payer: Self-pay | Admitting: Internal Medicine

## 2021-04-14 ENCOUNTER — Other Ambulatory Visit: Payer: Self-pay

## 2021-04-14 VITALS — BP 106/78 | HR 70 | Temp 98.0°F | Ht 65.0 in | Wt 219.0 lb

## 2021-04-14 DIAGNOSIS — E119 Type 2 diabetes mellitus without complications: Secondary | ICD-10-CM | POA: Diagnosis not present

## 2021-04-14 DIAGNOSIS — J441 Chronic obstructive pulmonary disease with (acute) exacerbation: Secondary | ICD-10-CM | POA: Diagnosis not present

## 2021-04-14 DIAGNOSIS — R197 Diarrhea, unspecified: Secondary | ICD-10-CM

## 2021-04-14 MED ORDER — AZITHROMYCIN 250 MG PO TABS: ORAL_TABLET | ORAL | 0 refills | Status: DC

## 2021-04-14 MED ORDER — HYDROCODONE BIT-HOMATROP MBR 5-1.5 MG/5ML PO SOLN: 5.0000 mL | Freq: Three times a day (TID) | ORAL | 0 refills | Status: DC | PRN

## 2021-04-14 MED ORDER — PREDNISONE 20 MG PO TABS: 40.0000 mg | ORAL_TABLET | Freq: Every day | ORAL | 0 refills | Status: AC

## 2021-04-14 NOTE — Assessment & Plan Note (Signed)
Acute Has been sick about 1 month Went to the emergency room about a month ago and was treated with oral steroids and has been using her nebulizer/inhaler Symptoms have not improved Has productive cough and increased shortness of breath Start prednisone 40 mg daily x5 days, Z-Pak, Hycodan cough syrup Continue nebulizer treatments/inhaler 3 times a day Call if no improvement

## 2021-04-14 NOTE — Assessment & Plan Note (Signed)
Acute Started about 1 month ago and was associated with nausea Likely side effect from Rybelsus-had not taken antibiotics in a long time and no other obvious cause Still having very loose stools several times a day, but this is improving since stopping the Rybelsus about a week ago Advised that she can take Imodium, which may help slow down the diarrhea Call if no improvement

## 2021-04-14 NOTE — Patient Instructions (Addendum)
° ° ° °  Medications changes include :   prednisone 40 mg daily x 5 days, zpak, hycodan cough syrup.   Take imodium as needed for the diarrhea  Your prescription(s) have been submitted to your pharmacy. Please take as directed and contact our office if you believe you are having problem(s) with the medication(s).

## 2021-04-14 NOTE — Assessment & Plan Note (Signed)
Chronic Sugars have been well controlled Started her on Rybelsus last month in hopes that it would help with weight loss, but she did not tolerate it secondary to nausea and diarrhea-she did stop this a week ago Once all of her symptoms have resolved and she is feeling better she will come back so we can discuss other options to replace the Rybelsus that may help with weight loss Sugars currently diet controlled

## 2021-05-14 ENCOUNTER — Encounter: Payer: Self-pay | Admitting: Internal Medicine

## 2021-05-14 NOTE — Progress Notes (Signed)
? ? ?Subjective:  ? ? Patient ID: April Combs, female    DOB: [birth year], 76 y.o.   MRN: [id number] ? ?This visit occurred during the SARS-CoV-2 public health emergency.  Safety protocols were in place, including screening questions prior to the visit, additional usage of staff PPE, and extensive cleaning of exam room while observing appropriate contact time as indicated for disinfecting solutions. ? ? ? ?HPI ?April Combs is here for  ?Chief Complaint  ?Patient presents with  ? Diabetes medication discussion  ? ? ? ?She is here to discuss what she can take for her diabetes. She did not tolerate rybelsus  - it caused nausea and diarrhea.  She did loose weight.  She wants to take something different for her diabetes.  She has heard about ozempic.  ? ?She is feeling better from her illness last month.  She said it took her a while but she does feel almost back to normal. ? ? ?Medications and allergies reviewed with patient and updated if appropriate. ? ?Current Outpatient Medications on File Prior to Visit  ?Medication Sig Dispense Refill  ? acetaminophen (TYLENOL) 325 MG tablet Take 2 tablets (650 mg total) by mouth every 6 (six) hours as needed for mild pain (or Fever >/= 101).    ? albuterol (PROVENTIL) (2.5 MG/3ML) 0.083% nebulizer solution Take 3 mLs (2.5 mg total) by nebulization every 6 (six) hours as needed for wheezing or shortness of breath. 150 mL 1  ? albuterol (VENTOLIN HFA) 108 (90 Base) MCG/ACT inhaler Inhale 2 puffs into the lungs every 6 (six) hours as needed for wheezing or shortness of breath.    ? allopurinol (ZYLOPRIM) 300 MG tablet TAKE 1 TABLET BY MOUTH  DAILY 90 tablet 3  ? atorvastatin (LIPITOR) 20 MG tablet TAKE 1 TABLET BY MOUTH  DAILY 90 tablet 3  ? carvedilol (COREG) 12.5 MG tablet TAKE 1 AND 1/2 TABLETS BY  MOUTH TWICE DAILY WITH A  MEAL (Patient taking differently: Take 18.75 mg by mouth 2 (two) times daily with a meal.) 270 tablet 3  ? furosemide (LASIX) 20 MG tablet Take 1 tablet (20 mg  total) by mouth daily as needed. (Patient taking differently: Take 20 mg by mouth daily as needed for fluid.) 45 tablet 3  ? gabapentin (NEURONTIN) 100 MG capsule TAKE 1 CAPSULE BY MOUTH IN  THE MORNING AND 2 CAPSULES  BY MOUTH AT BEDTIME 270 capsule 3  ? Glycopyrrolate-Formoterol (BEVESPI AEROSPHERE) 9-4.8 MCG/ACT AERO Inhale 1 Act into the lungs daily. 10.7 g 11  ? levothyroxine (SYNTHROID) 50 MCG tablet Take 1 tablet by mouth once daily 90 tablet 0  ? lisinopril-hydrochlorothiazide (ZESTORETIC) 20-25 MG tablet TAKE 1 TABLET BY MOUTH  DAILY 90 tablet 3  ? naproxen sodium (ALEVE) 220 MG tablet Take 220 mg by mouth daily as needed (pain).    ? omeprazole (PRILOSEC) 20 MG capsule TAKE 1 CAPSULE BY MOUTH  TWICE DAILY BEFORE A MEAL 180 capsule 3  ? ?No current facility-administered medications on file prior to visit.  ? ? ?Review of Systems ? ?   ?Objective:  ? ?Vitals:  ? 05/15/21 1303  ?BP: 116/82  ?Pulse: 80  ?Temp: 98.2 ?F (36.8 ?C)  ?SpO2: 95%  ? ?BP Readings from Last 3 Encounters:  ?05/15/21 116/82  ?04/14/21 106/78  ?03/14/21 114/67  ? ?Wt Readings from Last 3 Encounters:  ?05/15/21 218 lb 12.8 oz (99.2 kg)  ?04/14/21 219 lb (99.3 kg)  ?03/14/21 235 lb (106.6 kg)  ? ?Body  mass index is 36.41 kg/m?. ? ?  ?Physical Exam ?Constitutional:   ?   Appearance: Normal appearance.  ?HENT:  ?   Head: Normocephalic.  ?Skin: ?   General: Skin is warm and dry.  ?Neurological:  ?   Mental Status: She is alert. Mental status is at baseline.  ? ?   ? ? ? ? ? ?Assessment & Plan:  ? ? ?See Problem List for Assessment and Plan of chronic medical problems.  ? ? ? ? ?

## 2021-05-15 ENCOUNTER — Other Ambulatory Visit: Payer: Self-pay

## 2021-05-15 ENCOUNTER — Ambulatory Visit (INDEPENDENT_AMBULATORY_CARE_PROVIDER_SITE_OTHER): Payer: Medicare Other | Admitting: Internal Medicine

## 2021-05-15 DIAGNOSIS — I10 Essential (primary) hypertension: Secondary | ICD-10-CM

## 2021-05-15 DIAGNOSIS — E119 Type 2 diabetes mellitus without complications: Secondary | ICD-10-CM | POA: Diagnosis not present

## 2021-05-15 MED ORDER — OZEMPIC (0.25 OR 0.5 MG/DOSE) 2 MG/1.5ML ~~LOC~~ SOPN: PEN_INJECTOR | SUBCUTANEOUS | 0 refills | Status: DC

## 2021-05-15 NOTE — Assessment & Plan Note (Addendum)
Chronic ?Not currently on medication ?Sugars currently controlled, but the Rybelsus did help with that ?Rybelsus she did not tolerate secondary to nausea and diarrhea, but it was successful in helping her lose weight ?She was thinking about Ozempic.  Discussed that this is the same type of medication as Rybelsus and she may have the same side effect, but she would still like to try it ?Start Ozempic 0.25 mg injection weekly x4 weeks, then increase to 0.5 mg weekly-depending on how she does with this we can consider going up to higher dose for possibly continuing on the 0.5 mg dose ?Discussed Trulicity as a possible Plan B, which she may tolerate better ?Continue diabetic diet and continue to be as active as possible ?Has follow-up in June, but will contact me sooner if she has side effects to the Ozempic ? ?

## 2021-05-15 NOTE — Patient Instructions (Addendum)
? ? ? ? ?  Medications changes include :   ozempic 0.25 mg weekly for 4 weeks then increase to 0.5 mg weekly ? ? ?Your prescription(s) have been sent to your pharmacy.  ? ?

## 2021-05-15 NOTE — Assessment & Plan Note (Signed)
Chronic ?Blood pressure well controlled ?Continue Coreg 18.75 twice daily, lisinopril-HCTZ 20-25 mg daily ?

## 2021-05-21 ENCOUNTER — Ambulatory Visit (INDEPENDENT_AMBULATORY_CARE_PROVIDER_SITE_OTHER): Payer: Medicare Other

## 2021-05-21 DIAGNOSIS — R7303 Prediabetes: Secondary | ICD-10-CM

## 2021-05-21 DIAGNOSIS — E039 Hypothyroidism, unspecified: Secondary | ICD-10-CM

## 2021-05-21 DIAGNOSIS — I10 Essential (primary) hypertension: Secondary | ICD-10-CM

## 2021-05-21 DIAGNOSIS — I5032 Chronic diastolic (congestive) heart failure: Secondary | ICD-10-CM

## 2021-05-21 DIAGNOSIS — K219 Gastro-esophageal reflux disease without esophagitis: Secondary | ICD-10-CM

## 2021-05-21 DIAGNOSIS — E785 Hyperlipidemia, unspecified: Secondary | ICD-10-CM

## 2021-05-21 NOTE — Patient Instructions (Signed)
Visit Information ? ?Following are the goals we discussed today:  ? ?Manage My Medicine  ? ?Timeframe:  Long-Range Goal ?Priority:  Medium ?Start Date:  08/19/20                           ?Expected End Date:  05/22/2022                ? ?Follow Up Date Sept 2023 ?  ?- call for medicine refill 2 or 3 days before it runs out ?- call if I am sick and can't take my medicine ?- keep a list of all the medicines I take; vitamins and herbals too ?- use a pillbox to sort medicine  ?-Ask about Shingrix and COVID booster at local pharmacy ?  ?Why is this important?   ?These steps will help you keep on track with your medicines. ? ?Plan: Telephone follow up appointment with care management team member scheduled for:  6 months  ?The patient has been provided with contact information for the care management team and has been advised to call with any health related questions or concerns.  ? ?Tomasa Blase, PharmD ?Clinical Pharmacist, [place]  ? ?Please call the care guide team at [phone] if you need to cancel or reschedule your appointment.  ? ?Patient verbalizes understanding of instructions and care plan provided today and agrees to view in [place]. Active MyChart status confirmed with patient.   ? ?

## 2021-05-21 NOTE — Progress Notes (Signed)
Chronic Care Management Pharmacy Note  05/21/2021 Name:  April Combs MRN:  [id number] DOB:  [birth year]  Summary: -Patient has started ozempic - taken 1 x 0.24m dose - denies any issues since starting  -Notes that she continues trelegy at this time, plan to start bevespi once she has completed her remaining trelegy - has >3 months of trelegy left -Since she has been taking trelegy every day, reports breathing has improved - is using albuterol nebulizer TID, but rarely uses albuterol inhaler  -Does not check BP at home, in office has been well controlled  -LDL remains slightly elevated from goal  Recommendations/Changes made from today's visit: -Counseled patient on impact of dietary changes to BP and cholesterol (limiting sodium / fatty and high cholesterol foods)  -Recommended for patient to continue current medications, patient will make office aware when she switches to bevespi inhaler from trelegy - but will continue trelegy for now    Subjective: BJaskirat Zertucheis an 76y.o. year old female who is a primary patient of Burns, SClaudina Lick MD.  The CCM team was consulted for assistance with disease management and care coordination needs.    Engaged with patient by telephone for follow up visit in response to provider referral for pharmacy case management and/or care coordination services.   Consent to Services:  The patient was given the following information about Chronic Care Management services today, agreed to services, and gave verbal consent: 1. CCM service includes personalized support from designated clinical staff supervised by the primary care provider, including individualized plan of care and coordination with other care providers 2. 24/7 contact phone numbers for assistance for urgent and routine care needs. 3. Service will only be billed when office clinical staff spend 20 minutes or more in a month to coordinate care. 4. Only one practitioner may furnish and bill the  service in a calendar month. 5.The patient may stop CCM services at any time (effective at the end of the month) by phone call to the office staff. 6. The patient will be responsible for cost sharing (co-pay) of up to 20% of the service fee (after annual deductible is met). Patient agreed to services and consent obtained.  Patient Care Team: BBinnie Rail MD as PCP - General (Internal Medicine) HLeonie Man MD as PCP - Cardiology (Cardiology) HLeonie Man MD as Consulting Physician (Cardiology) BLafayette Dragon MD (Inactive) as Consulting Physician (Gastroenterology) RBrand Males MD as Consulting Physician (Pulmonary Disease) STomasa Blase [facility](Pharmacist)  Recent office visits: 05/15/2021 - Dr. BQuay Burow- stop rybelsus - started on ozempic f/u in 3 months  04/14/2021 - Dr. BQuay Burow- stopped rybelsus due to nausea  - COPD exacerbation - rx'd prednisone - Z-Pak and hycodan cough syrup 02/19/2021 - Dr. BQuay Burow- start rybelsus 371mdaily - f/u in 6 months   Recent consult visits: 09/10/19 Dr HaEllyn Hackcardiology): f/u HFpEF, hx palpitations. Dyspnea likely pulmonary/deconditioning. No med changes  Hospital visits: 03/14/2021 - ED visit due to COPD exacerbation - rx'd medrol dose pak on discharge   Objective:  Lab Results  Component Value Date   CREATININE 1.19 (H) 03/14/2021   BUN 27 (H) 03/14/2021   GFR 75.94 02/19/2021   GFRNONAA 48 (L) 03/14/2021   GFRAA 107 07/04/2017   NA 137 03/14/2021   K 3.7 03/14/2021   CALCIUM 8.8 (L) 03/14/2021   CO2 22 03/14/2021   GLUCOSE 115 (H) 03/14/2021    Lab Results  Component Value Date/Time  HGBA1C 6.0 02/19/2021 09:03 AM   HGBA1C 6.6 (H) 10/30/2020 04:06 AM   GFR 75.94 02/19/2021 09:03 AM   GFR 53.75 (L) 08/20/2020 08:57 AM    Last diabetic Eye exam: No results found for: HMDIABEYEEXA  Last diabetic Foot exam: No results found for: HMDIABFOOTEX   Lab Results  Component Value Date   CHOL 171 02/19/2021   HDL 58.70  02/19/2021   LDLCALC 78 02/19/2021   LDLDIRECT 74.0 08/20/2020   TRIG 171.0 (H) 02/19/2021   CHOLHDL 3 02/19/2021    Hepatic Function Latest Ref Rng & Units 02/19/2021 11/01/2020 10/31/2020  Total Protein 6.0 - 8.3 g/dL 6.9 5.5(L) 5.9(L)  Albumin 3.5 - 5.2 g/dL 4.2 2.6(L) 2.8(L)  AST 0 - 37 U/L 20 23 28   ALT 0 - 35 U/L 13 27 32  Alk Phosphatase 39 - 117 U/L 73 62 71  Total Bilirubin 0.2 - 1.2 mg/dL 0.5 0.6 <0.1(L)  Bilirubin, Direct 0.0 - 0.3 mg/dL - - -    Lab Results  Component Value Date/Time   TSH 4.51 02/19/2021 09:03 AM   TSH 3.57 08/20/2020 08:57 AM   FREET4 0.69 02/15/2018 09:22 AM    CBC Latest Ref Rng & Units 03/14/2021 02/19/2021 11/01/2020  WBC 4.0 - 10.5 K/uL 9.9 4.6 15.2(H)  Hemoglobin 12.0 - 15.0 g/dL 11.4(L) 12.7 10.8(L)  Hematocrit 36.0 - 46.0 % 33.7(L) 38.0 31.7(L)  Platelets 150 - 400 K/uL 218 158.0 274    No results found for: VD25OH  Clinical ASCVD: Yes  - aortic atherosclerosis The 10-year ASCVD risk score (Arnett DK, et al., 2019) is: 27.9%   Values used to calculate the score:     Age: 86 years     Sex: Female     Is Non-Hispanic African American: No     Diabetic: Yes     Tobacco smoker: No     Systolic Blood Pressure: 811 mmHg     Is BP treated: Yes     HDL Cholesterol: 58.7 mg/dL     Total Cholesterol: 171 mg/dL    Depression screen [facility] 2/9 12/05/2020 08/20/2019 08/18/2018  Decreased Interest 0 0 0  Down, Depressed, Hopeless 0 0 0  PHQ - 2 Score 0 0 0  Altered sleeping - - -  Tired, decreased energy - - -  Change in appetite - - -  Feeling bad or failure about yourself  - - -  Trouble concentrating - - -  Moving slowly or fidgety/restless - - -  Suicidal thoughts - - -  PHQ-9 Score - - -    Social History   Tobacco Use  Smoking Status Former   Packs/day: 1.00   Years: 30.00   Pack years: 30.00   Types: Cigarettes   Quit date: 01/18/2003   Years since quitting: 18.3  Smokeless Tobacco Never   BP Readings from Last 3 Encounters:   05/15/21 116/82  04/14/21 106/78  03/14/21 114/67   Pulse Readings from Last 3 Encounters:  05/15/21 80  04/14/21 70  03/14/21 96   Wt Readings from Last 3 Encounters:  05/15/21 218 lb 12.8 oz (99.2 kg)  04/14/21 219 lb (99.3 kg)  03/14/21 235 lb (106.6 kg)   BMI Readings from Last 3 Encounters:  05/15/21 36.41 kg/m  04/14/21 36.44 kg/m  03/14/21 39.11 kg/m    Assessment/Interventions: Review of patient past medical history, allergies, medications, health status, including review of consultants reports, laboratory and other test data, was performed as part of comprehensive evaluation and  provision of chronic care management services.   SDOH:  (Social Determinants of Health) assessments and interventions performed: Yes   SDOH Screenings   Alcohol Screen: Low Risk    Last Alcohol Screening Score (AUDIT): 0  Depression (PHQ2-9): Low Risk    PHQ-2 Score: 0  Financial Resource Strain: Low Risk    Difficulty of Paying Living Expenses: Not hard at all  Food Insecurity: No Food Insecurity   Worried About Charity fundraiser in the Last Year: Never true   Ran Out of Food in the Last Year: Never true  Housing: Low Risk    Last Housing Risk Score: 0  Physical Activity: Sufficiently Active   Days of Exercise per Week: 5 days   Minutes of Exercise per Session: 30 min  Social Connections: Socially Isolated   Frequency of Communication with Friends and Family: More than three times a week   Frequency of Social Gatherings with Friends and Family: Once a week   Attends Religious Services: Never   Marine scientist or Organizations: No   Attends Archivist Meetings: Never   Marital Status: Widowed  Stress: No Stress Concern Present   Feeling of Stress : Not at all  Tobacco Use: Medium Risk   Smoking Tobacco Use: Former   Smokeless Tobacco Use: Never   Passive Exposure: Not on file  Transportation Needs: No Transportation Needs   Lack of Transportation  (Medical): No   Lack of Transportation (Non-Medical): No    CCM Care Plan  Allergies  Allergen Reactions   Semaglutide Diarrhea    Rybelsus - nausea and diarrhea    Medications Reviewed Today     Reviewed by Tomasa Blase, [facility] (Pharmacist) on 05/21/21 at 1326  Med List Status: <None>   Medication Order Taking? Sig Documenting Provider Last Dose Status Informant  acetaminophen (TYLENOL) 325 MG tablet [id number] Yes Take 2 tablets (650 mg total) by mouth every 6 (six) hours as needed for mild pain (or Fever >/= 101). Domenic Polite, MD Taking Active   albuterol (PROVENTIL) (2.5 MG/3ML) 0.083% nebulizer solution [id number] Yes Take 3 mLs (2.5 mg total) by nebulization every 6 (six) hours as needed for wheezing or shortness of breath. Binnie Rail, MD Taking Active   albuterol (VENTOLIN HFA) 108 (90 Base) MCG/ACT inhaler [id number] Yes Inhale 2 puffs into the lungs every 6 (six) hours as needed for wheezing or shortness of breath. [provider] Taking Active Self  allopurinol (ZYLOPRIM) 300 MG tablet [id number] Yes TAKE 1 TABLET BY MOUTH  DAILY Burns, Claudina Lick, MD Taking Active   atorvastatin (LIPITOR) 20 MG tablet [id number] Yes TAKE 1 TABLET BY MOUTH  DAILY Burns, Claudina Lick, MD Taking Active   carvedilol (COREG) 12.5 MG tablet [id number] Yes TAKE 1 AND 1/2 TABLETS BY  MOUTH TWICE DAILY WITH A  MEAL  Patient taking differently: Take 18.75 mg by mouth 2 (two) times daily with a meal.   Leonie Man, MD Taking Active   Fluticasone-Umeclidin-Vilant (TRELEGY ELLIPTA) 100-62.5-25 MCG/ACT AEPB [id number] Yes Inhale 1 puff into the lungs daily. Plans to start bevespi after completing rx for trelegy [provider] Taking Active   furosemide (LASIX) 20 MG tablet [id number] Yes Take 1 tablet (20 mg total) by mouth daily as needed.  Patient taking differently: Take 20 mg by mouth daily as needed for fluid.   Leonie Man, MD Taking Active Self  gabapentin (NEURONTIN) 100 MG  capsule [id number] Yes TAKE 1 CAPSULE BY  MOUTH IN  THE MORNING AND 2 CAPSULES  BY MOUTH AT BEDTIME Binnie Rail, MD Taking Active Self  Glycopyrrolate-Formoterol (BEVESPI AEROSPHERE) 9-4.8 MCG/ACT AERO [id number] No Inhale 1 Act into the lungs daily.  Patient not taking: Reported on 05/21/2021   Binnie Rail, MD Not Taking Active   levothyroxine (SYNTHROID) 50 MCG tablet [id number] Yes Take 1 tablet by mouth once daily Binnie Rail, MD Taking Active   lisinopril-hydrochlorothiazide (ZESTORETIC) 20-25 MG tablet [id number] Yes TAKE 1 TABLET BY MOUTH  DAILY Leonie Man, MD Taking Active Self  naproxen sodium (ALEVE) 220 MG tablet [id number] Yes Take 220 mg by mouth daily as needed (pain). [provider] Taking Active Self  omeprazole (PRILOSEC) 20 MG capsule [id number] Yes TAKE 1 CAPSULE BY MOUTH  TWICE DAILY BEFORE A MEAL Burns, Claudina Lick, MD Taking Active   Semaglutide,0.25 or 0.5MG/DOS, (OZEMPIC, 0.25 OR 0.5 MG/DOSE,) 2 MG/1.5ML SOPN [id number] Yes Inject 0.25 mg into the skin once a week for 30 days, THEN 0.5 mg once a week. Binnie Rail, MD Taking Active             Patient Active Problem List   Diagnosis Date Noted   Diarrhea 04/13/2021   B12 deficiency 02/22/2021   Hair loss 02/19/2021   Tingling in extremities 02/19/2021   CKD (chronic kidney disease) stage 3, GFR 30-59 ml/min (HCC) 02/18/2021   COPD exacerbation ([place]) 11/10/2020   Acute bacterial bronchitis 11/10/2020   COPD (chronic obstructive pulmonary disease) ([place]) 10/28/2020   Chronic heart failure with preserved ejection fraction (HFpEF) ([place]) 10/28/2020   Dyspnea 10/28/2020   Pneumonia due to COVID-19 virus 10/27/2020   Atherosclerosis of aorta ([place]) 12/31/2019   Physical deconditioning 08/18/2018   Bilateral leg weakness 02/15/2018   Diabetes ([place]) 08/10/2017   Hypertensive 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 ([place]) 04/26/2017   Gout 04/10/2017   Hypothyroidism 02/09/2017    Morbid obesity ([place]) 10/15/2015   Pulmonary emphysema ([place]) 04/29/2015   Lumbar radiculopathy 12/24/2014   Hx of tobacco use, presenting hazards to health 09/29/2014   Nail fungus 03/11/2014   FALSE POSITIVE NUCLEAR STRESS CHEST 01/28/2014   Chest pain on exertion 01/14/2014   GERD (gastroesophageal reflux disease) 05/15/2012   Positional vertigo 12/22/2011   Irregular heart beat 09/29/2011   Hyperlipidemia with target LDL less than 100 09/29/2011   SOB (shortness of breath) 08/23/2011   Essential hypertension 05/28/2008   OSTEOARTHRITIS 05/28/2008    Immunization History  Administered Date(s) Administered   Fluad Quad(high Dose 65+) 11/09/2018   Influenza, High Dose Seasonal PF 01/28/2015, 12/24/2015, 12/13/2016, 11/16/2017, 12/28/2019   Influenza,inj,Quad PF,6+ Mos 12/24/2015   Janssen (J&J) SARS-COV-2 Vaccination 06/01/2019, 12/28/2019   Pneumococcal Conjugate-13 09/19/2013   Pneumococcal Polysaccharide-23 09/25/2014   Tdap 02/29/2008    Conditions to be addressed/monitored:  Hypertension, Hyperlipidemia, Heart Failure, Coronary Artery Disease, GERD, COPD, Hypothyroidism, Osteoarthritis and Gout  Care Plan : CCM Pharmacy Care Plan  Updates made by Tomasa Blase, RPH since 05/21/2021 12:00 AM     Problem: Hypertension, Hyperlipidemia, Heart Failure, Coronary Artery Disease, GERD, COPD, Hypothyroidism, Osteoarthritis and Gout   Priority: High     Long-Range Goal: Disease management   Start Date: 08/19/2020  Expected End Date: 05/22/2022  This Visit's Progress: On track  Recent Progress: On track  Priority: High  Note:   Current Barriers:  Unable to independently afford treatment regimen Unable to independently monitor therapeutic efficacy  Pharmacist Clinical Goal(s):  Patient will verbalize ability  to afford treatment regimen achieve adherence to monitoring guidelines and medication adherence to achieve therapeutic efficacy through collaboration with PharmD and  provider.   Interventions: 1:1 collaboration with Binnie Rail, MD regarding development and update of comprehensive plan of care as evidenced by provider attestation and co-signature Inter-disciplinary care team collaboration (see longitudinal plan of care) Comprehensive medication review performed; medication list updated in electronic medical record  Hypertension / diastolic HF (BP goal <494/49) -Controlled - BP in office is at goal; pt does not monitor routinely at home; uses furosemide as needed for fluid retention  -Last ejection fraction: 45% (04/2017) -Current treatment: Carvedilol 12.5 mg - 1.5 tab BID Furosemide 20 mg daily PRN - couple days a week Lisinopril-HCTZ 20-25 mg daily AM -Medications previously tried:   -Current home readings: does not monitor at home  BP Readings from Last 3 Encounters:  05/15/21 116/82  04/14/21 106/78  03/14/21 114/67  -Current dietary habits: no limitations -Current exercise habits: none -Denies hypotensive/hypertensive symptoms -Educated on BP goals and benefits of medications for prevention of heart attack, stroke and kidney damage; Daily salt intake goal < 2300 mg; Exercise goal of 150 minutes per week; -Counseled to monitor BP at home as needed, document, and provide log at future appointments -Recommended to continue current medication  Hyperlipidemia: (LDL goal < 70) -Not ideally controlled - most recent LDL was slightly above goal of < 70; pt is non-compliant with low-cholesterol diet  Lab Results  Component Value Date   LDLCALC 78 02/19/2021  -aortic atherosclerosis on CT (12/2019) -Current treatment: Atorvastatin 20 mg daily Nitroglycerin 0.4 mg SL prn  -Educated on Cholesterol goals; Benefits of statin for ASCVD risk reduction; Importance of limiting foods high in cholesterol; -Recommended to continue current medication - believe that with changes to diet and exercise LDL goal can be reached - recheck LDL with next PCP visit -  if remains elevated could increase atorvastatin dose   COPD (Goal: control symptoms and prevent exacerbations) -Controlled - improved since starting to use trelegy as directed - has been written rx for bevespi but has not yet started because she has remaining trelegy inhalers - will start bevespi once completed (has >1month of trelegy remaining) -Gold Grade: Gold 2 (FEV1 50-79%) -Current COPD Classification:  April (high sx, <2 exacerbations/yr) -MMRC/CAT score: not on file -Pulmonary function testing: 06/02/2015- FEV1 56% predicted, FEV1/FVC 0.66 -Exacerbations requiring treatment in last 6 months: none -Current treatment  Trelegy - 1 puff daily   Albuterol HFA q6h prn  Albuterol nebulizer soln  - uses 1 vial 3 times daily  -Patient denies consistent use of maintenance inhaler -Frequency of rescue inhaler use: daily -Counseled on Benefits of consistent maintenance inhaler use -Patient does not qualify for trelegy PAP due to OOP requirement - will switch to bevespi in future - plan to reassess COPD control at that time  -Recommend to continue current medication  Prediabetes (A1c goal <6.5%) -Diet-controlled - A1c has been stable for past several years Lab Results  Component Value Date   HGBA1C 6.0 02/19/2021  -Current Treatment  Ozmepic 0.263mweekly - has taken 1 dose - denies issues (previously unable to tolerate rybelsus) - patient aware that she is to increase to 0.45m72mose after 4 weeks of 0.245m24mCounseled on diet and exercise extensively -Continue current medication - reach out to clinic with any issues or concerns regarding medication / side effects   Gout (Goal: uric acid < 6, prevent flares) -Controlled - pt has gout-like symptoms  every few months but symptoms typically resolve within a day with naproxen; most recent uric acid was at goal; pt acknowledges red meat can lead to gout flares Lab Results  Component Value Date   LABURIC 4.9 02/19/2020  -Current treatment   Allopurinol 300 mg daily Naproxen 220 mg PRN - rare use  -Counseled on low purine diet - limiting high-fat foods, yeast, beer/alcohol, high fructose corn syrup -Recommend to continue current medication -educated patient on using NSAID only as needed due her kidney function - patient using APAP first and using naproxen if pain persists   Pain (Goal: manage symptoms) -lumbar radiculopathy, osteoarthritis -Controlled - Patient is satisfied with current regimen and denies issues -Current treatment  Gabapentin 100 mg - 1 AM, 2 PM -Recommended to continue current medication  Hypothyroidism (Goal: maintain TSH in goal range) -Controlled - TSH has been at goal on current dose; pt does not separate levothyroxine from other medications Lab Results  Component Value Date   TSH 4.51 02/19/2021  -Current treatment  Levothyroxine 50 mcg daily  -Recommended to continue current medication  GERD (Goal: manage symptoms) -Controlled  -Current treatment  Omeprazole 20 mg BID -Recommended to continue current medication  Health Maintenance -Vaccine gaps: Shingrix, covid booster (J&J)  Patient Goals/Self-Care Activities Patient will:  - take medications as prescribed focus on medication adherence by pill box target a minimum of 150 minutes of moderate intensity exercise weekly engage in dietary modifications by reducing salt, increasing vegetables/lean meats Ask about Shingrix and COVID booster at local pharmacy       Medication Assistance: None required.  Patient affirms current coverage meets needs.  Compliance/Adherence/Medication fill history: Care Gaps: Shingrix Covid booster  Foot Exam  Ophthalmology Exam   Patient's preferred pharmacy is:  [facility] ([place]) - [place], [place] [address] [place] [address] Phone: [phone] Fax: [phone]  [place] [address] [place]), [place] [address] [place]  [address] [place] ([place]) [place] [address] Phone: [phone] Fax: [phone]  [facility] ([facility] ) - [place], [address] [place] [address] [place] [address] Phone: [phone] Fax: [phone]  Uses pill box? Yes Pt endorses 100% compliance  We discussed: Current pharmacy is preferred with insurance plan and patient is satisfied with pharmacy services Patient decided to: Continue current medication management strategy  Care Plan and Follow Up Patient Decision:  Patient agrees to Care Plan and Follow-up.  Plan: Telephone follow up appointment with care management team member scheduled for:  6 months  Tomasa Blase, PharmD Clinical Pharmacist, [place]

## 2021-06-01 ENCOUNTER — Other Ambulatory Visit: Payer: Self-pay | Admitting: Internal Medicine

## 2021-06-05 ENCOUNTER — Other Ambulatory Visit: Payer: Self-pay | Admitting: Internal Medicine

## 2021-06-12 DIAGNOSIS — I110 Hypertensive heart disease with heart failure: Secondary | ICD-10-CM

## 2021-06-12 DIAGNOSIS — J449 Chronic obstructive pulmonary disease, unspecified: Secondary | ICD-10-CM | POA: Diagnosis not present

## 2021-06-12 DIAGNOSIS — I5030 Unspecified diastolic (congestive) heart failure: Secondary | ICD-10-CM

## 2021-06-12 DIAGNOSIS — E785 Hyperlipidemia, unspecified: Secondary | ICD-10-CM

## 2021-06-12 DIAGNOSIS — E039 Hypothyroidism, unspecified: Secondary | ICD-10-CM

## 2021-06-18 ENCOUNTER — Encounter: Payer: Self-pay | Admitting: Internal Medicine

## 2021-06-19 ENCOUNTER — Other Ambulatory Visit: Payer: Self-pay | Admitting: Internal Medicine

## 2021-06-19 ENCOUNTER — Other Ambulatory Visit: Payer: Self-pay | Admitting: Cardiology

## 2021-06-19 DIAGNOSIS — I10 Essential (primary) hypertension: Secondary | ICD-10-CM

## 2021-06-21 MED ORDER — RYBELSUS 3 MG PO TABS: 3.0000 mg | ORAL_TABLET | Freq: Every day | ORAL | 5 refills | Status: DC

## 2021-07-06 ENCOUNTER — Other Ambulatory Visit: Payer: Self-pay | Admitting: Internal Medicine

## 2021-08-19 ENCOUNTER — Encounter: Payer: Self-pay | Admitting: Internal Medicine

## 2021-08-19 NOTE — Progress Notes (Signed)
Subjective:    Patient ID: April Combs, female    DOB: 09/24/1945, 76 y.o.   MRN: [id number]     HPI Alveta is here for follow up of her chronic medical problems, including htn, hld, DM, gout, gerd, hypothyroid, COPD, CKD, chronic back/left leg pain, morbid obesity  She is taking her medication daily as prescribed.  Her legs sometimes hurt, feels stiff - it will wake her up.  May 1st she woke up with upper lips swelling.  This only occurred once.   She has had intermittent tingling around her mouth.  This has happened a few times.  It then spreads to her whole face. It feels like her facial muscles are twitching.  Then it starting moving around her whole body - the whole body felt like it had a vibration in it.      Medications and allergies reviewed with patient and updated if appropriate.  Current Outpatient Medications on File Prior to Visit  Medication Sig Dispense Refill   acetaminophen (TYLENOL) 325 MG tablet Take 2 tablets (650 mg total) by mouth every 6 (six) hours as needed for mild pain (or Fever >/= 101).     albuterol (PROVENTIL) (2.5 MG/3ML) 0.083% nebulizer solution USE 1 VIAL IN NEBULIZER EVERY 6 HOURS AS NEEDED FOR WHEEZING FOR SHORTNESS OF BREATH 150 mL 0   albuterol (VENTOLIN HFA) 108 (90 Base) MCG/ACT inhaler Inhale 2 puffs into the lungs every 6 (six) hours as needed for wheezing or shortness of breath.     allopurinol (ZYLOPRIM) 300 MG tablet TAKE 1 TABLET BY MOUTH  DAILY 90 tablet 3   atorvastatin (LIPITOR) 20 MG tablet TAKE 1 TABLET BY MOUTH  DAILY 90 tablet 3   carvedilol (COREG) 12.5 MG tablet TAKE 1 AND 1/2 TABLETS BY  MOUTH TWICE DAILY WITH A  MEAL 270 tablet 3   Fluticasone-Umeclidin-Vilant (TRELEGY ELLIPTA) 100-62.5-25 MCG/ACT AEPB Inhale 1 puff into the lungs daily. Plans to start bevespi after completing rx for trelegy     furosemide (LASIX) 20 MG tablet Take 1 tablet (20 mg total) by mouth daily as needed. (Patient taking differently: Take 20  mg by mouth daily as needed for fluid.) 45 tablet 3   gabapentin (NEURONTIN) 100 MG capsule TAKE 1 CAPSULE BY MOUTH IN  THE MORNING AND 2 CAPSULES  BY MOUTH AT BEDTIME 270 capsule 3   Glycopyrrolate-Formoterol (BEVESPI AEROSPHERE) 9-4.8 MCG/ACT AERO Inhale 1 Act into the lungs daily. 10.7 g 11   levothyroxine (SYNTHROID) 50 MCG tablet Take 1 tablet by mouth once daily 90 tablet 0   lisinopril-hydrochlorothiazide (ZESTORETIC) 20-25 MG tablet TAKE 1 TABLET BY MOUTH  DAILY 90 tablet 3   naproxen sodium (ALEVE) 220 MG tablet Take 220 mg by mouth daily as needed (pain).     omeprazole (PRILOSEC) 20 MG capsule TAKE 1 CAPSULE BY MOUTH  TWICE DAILY BEFORE A MEAL 180 capsule 3   Semaglutide (RYBELSUS) 3 MG TABS Take 3 mg by mouth daily. 30 tablet 5   No current facility-administered medications on file prior to visit.     Review of Systems  Constitutional:  Negative for chills and fever.  Respiratory:  Positive for cough, shortness of breath and wheezing.   Cardiovascular:  Positive for palpitations (occ). Negative for chest pain and leg swelling.  Neurological:  Positive for headaches. Negative for light-headedness.       Objective:   Vitals:   08/20/21 0749  BP: 116/70  Pulse: (!) 58  Temp: 98 F (36.7 C)  SpO2: 95%   BP Readings from Last 3 Encounters:  08/20/21 116/70  05/15/21 116/82  04/14/21 106/78   Wt Readings from Last 3 Encounters:  08/20/21 208 lb (94.3 kg)  05/15/21 218 lb 12.8 oz (99.2 kg)  04/14/21 219 lb (99.3 kg)   Body mass index is 34.61 kg/m.    Physical Exam Constitutional:      General: She is not in acute distress.    Appearance: Normal appearance.  HENT:     Head: Normocephalic and atraumatic.  Eyes:     Conjunctiva/sclera: Conjunctivae normal.  Cardiovascular:     Rate and Rhythm: Normal rate and regular rhythm.     Heart sounds: Normal heart sounds. No murmur heard. Pulmonary:     Effort: Pulmonary effort is normal. No respiratory distress.      Breath sounds: Wheezing (scant exp wheeze) present.  Musculoskeletal:     Cervical back: Neck supple.     Right lower leg: No edema.     Left lower leg: No edema.  Lymphadenopathy:     Cervical: No cervical adenopathy.  Skin:    General: Skin is warm and dry.     Findings: No rash.  Neurological:     Mental Status: She is alert. Mental status is at baseline.     Sensory: No sensory deficit.  Psychiatric:        Mood and Affect: Mood normal.        Behavior: Behavior normal.        Lab Results  Component Value Date   WBC 9.9 03/14/2021   HGB 11.4 (L) 03/14/2021   HCT 33.7 (L) 03/14/2021   PLT 218 03/14/2021   GLUCOSE 115 (H) 03/14/2021   CHOL 171 02/19/2021   TRIG 171.0 (H) 02/19/2021   HDL 58.70 02/19/2021   LDLDIRECT 74.0 08/20/2020   LDLCALC 78 02/19/2021   ALT 13 02/19/2021   AST 20 02/19/2021   NA 137 03/14/2021   K 3.7 03/14/2021   CL 101 03/14/2021   CREATININE 1.19 (H) 03/14/2021   BUN 27 (H) 03/14/2021   CO2 22 03/14/2021   TSH 4.51 02/19/2021   INR 1.08 01/28/2014   HGBA1C 6.0 02/19/2021     Assessment & Plan:    See Problem List for Assessment and Plan of chronic medical problems.

## 2021-08-19 NOTE — Patient Instructions (Addendum)
     Blood work was ordered.      Medications changes include :   stop rybelsus     Return in about 6 months (around 02/19/2022) for follow up.

## 2021-08-20 ENCOUNTER — Ambulatory Visit (INDEPENDENT_AMBULATORY_CARE_PROVIDER_SITE_OTHER): Payer: Medicare Other | Admitting: Internal Medicine

## 2021-08-20 VITALS — BP 116/70 | HR 58 | Temp 98.0°F | Ht 65.0 in | Wt 208.0 lb

## 2021-08-20 DIAGNOSIS — N1832 Chronic kidney disease, stage 3b: Secondary | ICD-10-CM | POA: Diagnosis not present

## 2021-08-20 DIAGNOSIS — E039 Hypothyroidism, unspecified: Secondary | ICD-10-CM

## 2021-08-20 DIAGNOSIS — E538 Deficiency of other specified B group vitamins: Secondary | ICD-10-CM | POA: Diagnosis not present

## 2021-08-20 DIAGNOSIS — M109 Gout, unspecified: Secondary | ICD-10-CM | POA: Diagnosis not present

## 2021-08-20 DIAGNOSIS — M5416 Radiculopathy, lumbar region: Secondary | ICD-10-CM

## 2021-08-20 DIAGNOSIS — E785 Hyperlipidemia, unspecified: Secondary | ICD-10-CM

## 2021-08-20 DIAGNOSIS — E119 Type 2 diabetes mellitus without complications: Secondary | ICD-10-CM

## 2021-08-20 DIAGNOSIS — J449 Chronic obstructive pulmonary disease, unspecified: Secondary | ICD-10-CM | POA: Diagnosis not present

## 2021-08-20 DIAGNOSIS — K219 Gastro-esophageal reflux disease without esophagitis: Secondary | ICD-10-CM | POA: Diagnosis not present

## 2021-08-20 DIAGNOSIS — R202 Paresthesia of skin: Secondary | ICD-10-CM

## 2021-08-20 DIAGNOSIS — I10 Essential (primary) hypertension: Secondary | ICD-10-CM | POA: Diagnosis not present

## 2021-08-20 LAB — COMPREHENSIVE METABOLIC PANEL
ALT: 13 U/L (ref 0–35)
AST: 18 U/L (ref 0–37)
Albumin: 4.1 g/dL (ref 3.5–5.2)
Alkaline Phosphatase: 96 U/L (ref 39–117)
BUN: 26 mg/dL — ABNORMAL HIGH (ref 6–23)
CO2: 25 mEq/L (ref 19–32)
Calcium: 9.6 mg/dL (ref 8.4–10.5)
Chloride: 106 mEq/L (ref 96–112)
Creatinine, Ser: 0.99 mg/dL (ref 0.40–1.20)
GFR: 55.97 mL/min — ABNORMAL LOW (ref >60.00)
Glucose, Bld: 98 mg/dL (ref 70–99)
Potassium: 4 mEq/L (ref 3.5–5.1)
Sodium: 141 mEq/L (ref 135–145)
Total Bilirubin: 0.5 mg/dL (ref 0.2–1.2)
Total Protein: 7 g/dL (ref 6.0–8.3)

## 2021-08-20 LAB — MICROALBUMIN / CREATININE URINE RATIO
Creatinine,U: 50.2 mg/dL
Microalb Creat Ratio: 1.4 mg/g (ref 0.0–30.0)
Microalb, Ur: <0.7 mg/dL (ref 0.0–1.9)

## 2021-08-20 LAB — CBC WITH DIFFERENTIAL/PLATELET
Basophils Absolute: 0.1 10*3/uL (ref 0.0–0.1)
Basophils Relative: 1.1 % (ref 0.0–3.0)
Eosinophils Absolute: 0.3 10*3/uL (ref 0.0–0.7)
Eosinophils Relative: 4.3 % (ref 0.0–5.0)
HCT: 36.9 % (ref 36.0–46.0)
Hemoglobin: 12.2 g/dL (ref 12.0–15.0)
Lymphocytes Relative: 24.3 % (ref 12.0–46.0)
Lymphs Abs: 1.5 10*3/uL (ref 0.7–4.0)
MCHC: 33.1 g/dL (ref 30.0–36.0)
MCV: 96.4 fl (ref 78.0–100.0)
Monocytes Absolute: 0.4 10*3/uL (ref 0.1–1.0)
Monocytes Relative: 6.4 % (ref 3.0–12.0)
Neutro Abs: 4 10*3/uL (ref 1.4–7.7)
Neutrophils Relative %: 63.9 % (ref 43.0–77.0)
Platelets: 192 10*3/uL (ref 150.0–400.0)
RBC: 3.83 Mil/uL — ABNORMAL LOW (ref 3.87–5.11)
RDW: 16 % — ABNORMAL HIGH (ref 11.5–15.5)
WBC: 6.3 10*3/uL (ref 4.0–10.5)

## 2021-08-20 LAB — LIPID PANEL
Cholesterol: 159 mg/dL (ref 0–200)
HDL: 56.9 mg/dL (ref >39.00)
LDL Cholesterol: 69 mg/dL (ref 0–99)
NonHDL: 102.06
Total CHOL/HDL Ratio: 3
Triglycerides: 167 mg/dL — ABNORMAL HIGH (ref 0.0–149.0)
VLDL: 33.4 mg/dL (ref 0.0–40.0)

## 2021-08-20 LAB — VITAMIN D 25 HYDROXY (VIT D DEFICIENCY, FRACTURES): VITD: 16.57 ng/mL — ABNORMAL LOW (ref 30.00–100.00)

## 2021-08-20 LAB — TSH: TSH: 2.55 u[IU]/mL (ref 0.35–5.50)

## 2021-08-20 LAB — HEMOGLOBIN A1C: Hgb A1c MFr Bld: 6.3 % (ref 4.6–6.5)

## 2021-08-20 MED ORDER — VITAMIN B-12 1000 MCG PO TABS: 1000.0000 ug | ORAL_TABLET | Freq: Every day | ORAL | Status: DC

## 2021-08-20 NOTE — Assessment & Plan Note (Signed)
Chronic She is not able to exercise secondary to chronic back pain Again discussed the importance of decreased portions, high in vegetables, fruits, legumes and low in carbs/sugars

## 2021-08-20 NOTE — Assessment & Plan Note (Signed)
Chronic CMP Stable

## 2021-08-20 NOTE — Assessment & Plan Note (Addendum)
Chronic Some shortness of breath with exertion stable Continue albuterol inhaler or neb treatment as needed Continue maintenance inhaler - trelegy

## 2021-08-20 NOTE — Assessment & Plan Note (Signed)
Chronic Controlled, stable Continue gabapentin 100 mg morning, 200 mg at bedtime

## 2021-08-20 NOTE — Assessment & Plan Note (Signed)
Chronic °Regular exercise and healthy diet encouraged °Check lipid panel  °Continue atorvastatin 20 mg daily °

## 2021-08-20 NOTE — Assessment & Plan Note (Signed)
Chronic Blood pressure well controlled CMP Continue Coreg 18.75 mg twice daily, lisinopril-HCTZ 20-25 mg daily 

## 2021-08-20 NOTE — Assessment & Plan Note (Signed)
Chronic  Clinically euthyroid Currently taking levothyroxine 50 mcg qd Check tsh  Titrate med dose if needed

## 2021-08-20 NOTE — Assessment & Plan Note (Addendum)
Chronic  Lab Results  Component Value Date   HGBA1C 6.0 02/19/2021   Sugars well controlled Check A1c, urine microalbumin today stop rybelsus 3 mg qd - due to cost Will see how she does w/o medication Stressed regular exercise, diabetic diet

## 2021-08-20 NOTE — Assessment & Plan Note (Signed)
New Having tingling around mouth - often expanding to face and whole body, some mm twitching ? Related to low ca Check pth, ca, vit d level Will replete She will let me know if symptoms persist

## 2021-08-20 NOTE — Assessment & Plan Note (Signed)
Chronic Related to ? Low vitamin d Ck pth, calcium, vitamin d level Will replete if needed

## 2021-08-20 NOTE — Assessment & Plan Note (Signed)
Chronic GERD controlled Continue omeprazole 20 mg twice daily 

## 2021-08-20 NOTE — Assessment & Plan Note (Signed)
Chronic Taking B12 daily 

## 2021-08-20 NOTE — Assessment & Plan Note (Signed)
Chronic Denies issues with gout Continue allopurinol 300 mg daily

## 2021-08-22 LAB — PTH, INTACT AND CALCIUM
Calcium: 9.2 mg/dL (ref 8.6–10.4)
PTH: 77 pg/mL (ref 16–77)

## 2021-09-25 ENCOUNTER — Other Ambulatory Visit: Payer: Self-pay | Admitting: Internal Medicine

## 2021-09-29 ENCOUNTER — Other Ambulatory Visit: Payer: Self-pay | Admitting: Cardiology

## 2021-09-29 DIAGNOSIS — I10 Essential (primary) hypertension: Secondary | ICD-10-CM

## 2021-11-07 ENCOUNTER — Other Ambulatory Visit: Payer: Self-pay | Admitting: Internal Medicine

## 2021-11-19 ENCOUNTER — Telehealth: Payer: Medicare Other

## 2021-12-03 ENCOUNTER — Other Ambulatory Visit: Payer: Self-pay | Admitting: Internal Medicine

## 2021-12-07 ENCOUNTER — Ambulatory Visit (INDEPENDENT_AMBULATORY_CARE_PROVIDER_SITE_OTHER): Payer: Medicare Other | Admitting: *Deleted

## 2021-12-07 ENCOUNTER — Encounter: Payer: Self-pay | Admitting: *Deleted

## 2021-12-07 VITALS — Ht 65.0 in | Wt 208.0 lb

## 2021-12-07 DIAGNOSIS — Z0000 Encounter for general adult medical examination without abnormal findings: Secondary | ICD-10-CM | POA: Diagnosis not present

## 2021-12-07 NOTE — Progress Notes (Signed)
Subjective:   April Combs is a 76 y.o. female who presents for Medicare Annual (Subsequent) preventive examination. I connected with  Laroy Apple on 12/07/21 by a audio enabled telemedicine application and verified that I am speaking with the correct person using two identifiers.  Patient Location: Home  Provider Location: Office/Clinic  I discussed the limitations of evaluation and management by telemedicine. The patient expressed understanding and agreed to proceed.  Review of Systems    Defer to PCP   See problem list for additional risk factors        Objective:    Today's Vitals   12/07/21 1310  Weight: 208 lb (94.3 kg)  Height: _0  (1.651 m)   Body mass index is 34.61 kg/m.     12/07/2021    1:26 PM 03/14/2021    1:40 PM 12/05/2020    2:25 PM 10/29/2020   12:45 AM 08/20/2019    8:31 AM 04/06/2017   12:25 PM 04/05/2015   11:48 AM  Advanced Directives  Does Patient Have a Medical Advance Directive? Yes No Yes No Yes Yes Yes  Type of Advance Directive Living will  Living will   Living will   Does patient want to make changes to medical advance directive?   No - Patient declined  No - Patient declined    Would patient like information on creating a medical advance directive?  Yes (ED - Information included in AVS)  No - Patient declined       Current Medications (verified) Outpatient Encounter Medications as of 12/07/2021  Medication Sig   acetaminophen (TYLENOL) 325 MG tablet Take 2 tablets (650 mg total) by mouth every 6 (six) hours as needed for mild pain (or Fever >/= 101).   albuterol (PROVENTIL) (2.5 MG/3ML) 0.083% nebulizer solution USE 1 VIAL IN NEBULIZER EVERY 6 HOURS AS NEEDED FOR WHEEZING FOR SHORTNESS OF BREATH   albuterol (VENTOLIN HFA) 108 (90 Base) MCG/ACT inhaler Inhale 2 puffs into the lungs every 6 (six) hours as needed for wheezing or shortness of breath.   allopurinol (ZYLOPRIM) 300 MG tablet TAKE 1 TABLET BY MOUTH  DAILY    atorvastatin (LIPITOR) 20 MG tablet TAKE 1 TABLET BY MOUTH  DAILY   carvedilol (COREG) 12.5 MG tablet TAKE 1 AND 1/2 TABLETS BY  MOUTH TWICE DAILY WITH A  MEAL   Fluticasone-Umeclidin-Vilant (TRELEGY ELLIPTA) 100-62.5-25 MCG/ACT AEPB Inhale 1 puff into the lungs daily. Plans to start bevespi after completing rx for trelegy   furosemide (LASIX) 20 MG tablet Take 1 tablet (20 mg total) by mouth daily as needed. (Patient taking differently: Take 20 mg by mouth daily as needed for fluid.)   gabapentin (NEURONTIN) 100 MG capsule TAKE 1 CAPSULE BY MOUTH IN  THE MORNING AND 2 CAPSULES  BY MOUTH AT BEDTIME   levothyroxine (SYNTHROID) 50 MCG tablet Take 1 tablet by mouth once daily   lisinopril-hydrochlorothiazide (ZESTORETIC) 20-25 MG tablet TAKE 1 TABLET BY MOUTH  DAILY   omeprazole (PRILOSEC) 20 MG capsule TAKE 1 CAPSULE BY MOUTH  TWICE DAILY BEFORE A MEAL   vitamin B-12 (CYANOCOBALAMIN) 1000 MCG tablet Take 1 tablet (1,000 mcg total) by mouth daily.   No facility-administered encounter medications on file as of 12/07/2021.    Allergies (verified) Semaglutide   History: Past Medical History:  Diagnosis Date   Anemia    Anxiety    BRBPR (bright red blood per rectum) 02/05/2015   Coronary artery disease (CAD) excluded 01/2014   Low  Risk Myoview (false positive) with possible inferior-inferolateral ischemia, but with nonsustained VT --> CATH --> Angiographically Normal Coronary Arteries; coronary CT angiogram showed mild approximately disease but otherwise no obstructive disease.  Coronary calcium score 40.   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 Echocardiogram July 2013 showed normal EF greater than 55%. Important septal motion. Normal LV filling pressures. Mild MR and mild anterior MVP   Obesity (BMI 30-39.9)    Osteoarthritis    Positional vertigo    Prediabetes 08/10/2017   Pulmonary emphysema ([place]) 04/29/2015   PFTs 2017 - moderate-severe obstructive disease, some restrictive disease   SOB (shortness of breath) 08/23/2011   Past Surgical History:  Procedure Laterality Date   ABDOMINAL HYSTERECTOMY     APPENDECTOMY  as child   BREAST BIOPSY Right    benign   CHOLECYSTECTOMY     COLONOSCOPY     CORONARY CALCIUM SCORE/CT ANGIOGRAM  07/2017   Calcium score 40.  Mild LAD stenosis, but no other significant disease.   DOPPLER ECHOCARDIOGRAPHY  July 2013   09/20/11. normal Lv thickness and function with EF greater thatn 55%   Exercise tolerance test - CPET-MET-TEST  July 2013   Submaximal effort. Only 80% of her, therefore peak VO2 of 75% is likely an underestimate. High resting heart rate, achieving 86% of heart rate. --> Unable to interpret due to lack of effort.   GASTRIC BYPASS  1980   GASTRIC BYPASS     KIDNEY STONE SURGERY     LEFT HEART CATHETERIZATION WITH CORONARY ANGIOGRAM N/A 02/01/2014   Procedure: LEFT HEART CATHETERIZATION WITH CORONARY ANGIOGRAM;  Surgeon: Leonie Man, MD;  Location: [facility] CATH LAB;  Service: Cardiovascular: Angiographically normal coronaries   NM MYOVIEW LTD  November 2015   4:20 min, 4.6 METS --> DOE, but no chest discomfort; Significant + ST -T wave changes noted with NSVT & PVCs. Images suggest Inferior-inferolateral ischemia.  Low Risk. -- FALSE POSITIVE   Pulmonary Function Tests  July 2013   Increased  densities, decreased FVC - consistent with obesity hypoventilation; FEV1 is 58%, FVC 60% of predicted.   TRANSTHORACIC ECHOCARDIOGRAM  04/2017   Mildly reduced EF of 45%.  No regional wall motion normality.  GR 1 DD   UPPER GASTROINTESTINAL ENDOSCOPY     Family History  Problem Relation Age of Onset   Kidney disease Daughter    Multiple sclerosis Daughter    Heart disease Father    Heart disease Mother    Thyroid disease Mother    Heart disease Paternal Grandfather    Colon cancer Other        early 66's.  Genetic testing from maternal side of family.   Colon cancer Maternal Aunt    Breast cancer Maternal Aunt    Lung cancer Sister        smoker  Diabetes Sister    Colon cancer Maternal Uncle    Diabetes Daughter        x3   Heart disease Maternal Aunt    Colon polyps Neg Hx    Esophageal cancer Neg Hx    Gallbladder disease Neg Hx    Rectal cancer Neg Hx    Stomach cancer Neg Hx    Social History   Socioeconomic History   Marital status: Married    Spouse name: Not on file   Number of children: 3   Years of education: Not on file   Highest education level: Not on file  Occupational History   Occupation: Retired  Tobacco Use   Smoking status: Former    Packs/day: 1.00    Years: 30.00    Total pack years: 30.00    Types: Cigarettes    Quit date: 01/18/2003    Years since quitting: 18.8   Smokeless tobacco: Never  Vaping Use   Vaping Use: Never used  Substance and Sexual Activity   Alcohol use: Yes    Alcohol/week: 3.0 standard drinks of alcohol    Types: 3 Cans of beer per week    Comment: drinks 2-3 beers a week   Drug use: No   Sexual activity: Not on file  Other Topics Concern   Not on file  Social History Narrative   She is married to [name], also patient of Dr. Ellyn Hack.   Mother of 3, grandmother 79.   Quit smoking in 2005. Social alcohol.   No routine exercise.   Social Determinants of Health   Financial Resource Strain: Low Risk   (12/07/2021)   Overall Financial Resource Strain (CARDIA)    Difficulty of Paying Living Expenses: Not hard at all  Food Insecurity: No Food Insecurity (12/07/2021)   Hunger Vital Sign    Worried About Running Out of Food in the Last Year: Never true    Ran Out of Food in the Last Year: Never true  Transportation Needs: No Transportation Needs (12/05/2020)   PRAPARE - Hydrologist (Medical): No    Lack of Transportation (Non-Medical): No  Physical Activity: Inactive (12/07/2021)   Exercise Vital Sign    Days of Exercise per Week: 0 days    Minutes of Exercise per Session: 0 min  Stress: No Stress Concern Present (12/07/2021)   [place]    Feeling of Stress : Not at all  Social Connections: Socially Isolated (12/07/2021)   Social Connection and Isolation Panel [NHANES]    Frequency of Communication with Friends and Family: More than three times a week    Frequency of Social Gatherings with Friends and Family: More than three times a week    Attends Religious Services: Never    Marine scientist or Organizations: No    Attends Archivist Meetings: Never    Marital Status: Widowed    Tobacco Counseling Counseling given: Not Answered   Clinical Intake:  Pre-visit preparation completed: Yes  Pain : No/denies pain     Nutritional Status: BMI > 30  Obese Nutritional Risks: None Diabetes: No  How often do you need to have someone help you when you read instructions, pamphlets, or other written materials from your doctor or pharmacy?: 1 - Never What is the last grade level you completed in school?: 12 th grade  Diabetic?no  Interpreter Needed?: No      Activities of  Daily Living    12/07/2021    1:25 PM  In your present state of health, do you have any difficulty performing the following activities:  Hearing? 0  Vision? 1  Comment Pt has glasses  Difficulty  concentrating or making decisions? 0  Walking or climbing stairs? 0  Dressing or bathing? 0  Doing errands, shopping? 0    Patient Care Team: Binnie Rail, MD as PCP - General (Internal Medicine) Leonie Man, MD as PCP - Cardiology (Cardiology) Leonie Man, MD as Consulting Physician (Cardiology) Lafayette Dragon, MD (Inactive) as Consulting Physician (Gastroenterology) Brand Males, MD as Consulting Physician (Pulmonary Disease) [place], Darnelle Maffucci, [facility] (Inactive) (Pharmacist)  Indicate any recent Medical Services you may have received from other than [facility] providers in the past year (date may be approximate).     Assessment:   This is a routine wellness examination for [place].  Hearing/Vision screen No results found.  Dietary issues and exercise activities discussed: Current Exercise Habits: The patient does not participate in regular exercise at present, Exercise limited by: None identified   Goals Addressed   None   Depression Screen    12/07/2021    1:19 PM 08/20/2021    8:39 AM 12/05/2020    2:29 PM 08/20/2019    8:32 AM 08/18/2018    8:34 AM 12/13/2016    9:14 AM 09/08/2016    3:58 PM  PHQ 2/9 Scores  PHQ - 2 Score 0 0 0 0 0 0 0  PHQ- 9 Score  0     0    Fall Risk    12/07/2021    1:19 PM 08/20/2021    8:40 AM 12/05/2020    2:27 PM 08/20/2020    8:29 AM 08/20/2019    8:31 AM  [place] in the past year? 0 0 0 0 1  Number falls in past yr: 0 0 0 0 1  Injury with Fall? 0 0 0 0 0  Risk for fall due to : No Fall Risks No Fall Risks No Fall Risks No Fall Risks Orthopedic patient;Impaired balance/gait  Follow up Falls evaluation completed Falls evaluation completed  Falls evaluation completed Falls evaluation completed;Education provided;Falls prevention discussed    FALL RISK PREVENTION PERTAINING TO THE HOME:  Any stairs in or around the home? No  If so, are there any without handrails? No  Home free of loose throw rugs in walkways, pet beds,  electrical cords, etc? Yes  Adequate lighting in your home to reduce risk of falls? Yes   ASSISTIVE DEVICES UTILIZED TO PREVENT FALLS:  Life alert? No  Use of a cane, walker or w/c? No  Grab bars in the bathroom? No  Shower chair or bench in shower? No  Elevated toilet seat or a handicapped toilet? No   TIMED UP AND GO:  Was the test performed? No .  Length of time to ambulate 10 feet: n/a sec.     Cognitive Function:        12/07/2021    1:25 PM  6CIT Screen  What Year? 0 points  What month? 0 points  What time? 0 points  Count back from 20 0 points  Months in reverse 0 points  Repeat phrase 0 points  Total Score 0 points    Immunizations Immunization History  Administered Date(s) Administered   Fluad Quad(high Dose 65+) 11/09/2018   Influenza, High Dose Seasonal PF 01/28/2015, 12/24/2015, 12/13/2016, 11/16/2017, 12/28/2019   Influenza,inj,Quad  PF,6+ Mos 12/24/2015   Janssen (J&J) SARS-COV-2 Vaccination 06/01/2019, 12/28/2019   Pneumococcal Conjugate-13 09/19/2013   Pneumococcal Polysaccharide-23 09/25/2014   Tdap 02/29/2008    TDAP status: Due, Education has been provided regarding the importance of this vaccine. Advised may receive this vaccine at local pharmacy or Health Dept. Aware to provide a copy of the vaccination record if obtained from local pharmacy or Health Dept. Verbalized acceptance and understanding.  Flu Vaccine status: Due, Education has been provided regarding the importance of this vaccine. Advised may receive this vaccine at local pharmacy or Health Dept. Aware to provide a copy of the vaccination record if obtained from local pharmacy or Health Dept. Verbalized acceptance and understanding.  Pneumonia Vaccine- Completed series  Covid-19 vaccine status: Completed vaccines  Qualifies for Shingles Vaccine? Yes   Zostavax completed No   Shingrix Completed?: No.    Education has been provided regarding the importance of this vaccine. Patient  has been advised to call insurance company to determine out of pocket expense if they have not yet received this vaccine. Advised may also receive vaccine at local pharmacy or Health Dept. Verbalized acceptance and understanding.  Screening Tests Health Maintenance  Topic Date Due   FOOT EXAM  Never done   COVID-19 Vaccine (3 - Booster for Janssen series) 02/22/2020   INFLUENZA VACCINE  10/13/2021   OPHTHALMOLOGY EXAM  06/07/2022 (Originally 08/16/1955)   TETANUS/TDAP  06/07/2022 (Originally 02/28/2018)   Zoster Vaccines- Shingrix (1 of 2) 06/07/2022 (Originally 08/16/1996)   DEXA SCAN  12/08/2022 (Originally 03/15/2017)   COLONOSCOPY (Pts 45-42yrs Insurance coverage will need to be confirmed)  01/25/2022   HEMOGLOBIN A1C  02/19/2022   Diabetic kidney evaluation - GFR measurement  08/21/2022   Diabetic kidney evaluation - Urine ACR  08/21/2022   Pneumonia Vaccine 43+ Years old  Completed   Hepatitis C Screening  Completed   HPV VACCINES  Aged Out    Health Maintenance  Health Maintenance Due  Topic Date Due   FOOT EXAM  Never done   COVID-19 Vaccine (3 - Booster for Janssen series) 02/22/2020   INFLUENZA VACCINE  10/13/2021    Colorectal cancer screening: Type of screening: Colonoscopy. Completed 01/26/2012. Repeat every 10 years  Mammogram status: No longer required due to patient decline due to age.  Bone Density- patient decline   Lung Cancer Screening: (Low Dose CT Chest recommended if Age 19-80 years, 30 pack-year currently smoking OR have quit w/in 15years.) does not qualify.   Lung Cancer Screening Referral: n/a  Additional Screening:  Hepatitis C Screening: does qualify; Completed 02/09/2017  Vision Screening: Recommended annual ophthalmology exams for early detection of glaucoma and other disorders of the eye. Is the patient up to date with their annual eye exam?  Yes  Who is the provider or what is the name of the office in which the patient attends annual eye exams?  Patient does not know the name of her eye doctor If pt is not established with a provider, would they like to be referred to a provider to establish care? No .   Dental Screening: Recommended annual dental exams for proper oral hygiene  Community Resource Referral / Chronic Care Management: CRR required this visit?  No   CCM required this visit?  No      Plan:     I have personally reviewed and noted the following in the patient's chart:   Medical and social history Use of alcohol, tobacco or illicit drugs  Current medications  and supplements including opioid prescriptions. Patient is not currently taking opioid prescriptions. Functional ability and status Nutritional status Physical activity Advanced directives List of other physicians Hospitalizations, surgeries, and ER visits in previous 12 months Vitals Screenings to include cognitive, depression, and falls Referrals and appointments  In addition, I have reviewed and discussed with patient certain preventive protocols, quality metrics, and best practice recommendations. A written personalized care plan for preventive services as well as general preventive health recommendations were provided to patient.     Mylinda Latina, CMA   12/07/2021  Non face to face 30 minutes  Nurse Notes:  Ms. Oyer , Thank you for taking time to come for your Medicare Wellness Visit. I appreciate your ongoing commitment to your health goals. Please review the following plan we discussed and let me know if I can assist you in the future.   These are the goals we discussed:  Goals      Manage My Medicine     Timeframe:  Long-Range Goal Priority:  Medium Start Date:  08/19/20                           Expected End Date:  05/22/2022                 Follow Up Date Sept 2023   - call for medicine refill 2 or 3 days before it runs out - call if I am sick and can't take my medicine - keep a list of all the medicines I take; vitamins and  herbals too - use a pillbox to sort medicine  -Ask about Shingrix and COVID booster at local pharmacy   Why is this important?   These steps will help you keep on track with your medicines.   Notes: /        This is a list of the screening recommended for you and due dates:  Health Maintenance  Topic Date Due   Complete foot exam   Never done   COVID-19 Vaccine (3 - Booster for Janssen series) 02/22/2020   Flu Shot  10/13/2021   Eye exam for diabetics  06/07/2022*   Tetanus Vaccine  06/07/2022*   Zoster (Shingles) Vaccine (1 of 2) 06/07/2022*   DEXA scan (bone density measurement)  12/08/2022*   Colon Cancer Screening  01/25/2022   Hemoglobin A1C  02/19/2022   Yearly kidney function blood test for diabetes  08/21/2022   Yearly kidney health urinalysis for diabetes  08/21/2022   Pneumonia Vaccine  Completed   Hepatitis C Screening: USPSTF Recommendation to screen - Ages 20-79 yo.  Completed   HPV Vaccine  Aged Out  *Topic was postponed. The date shown is not the original due date.

## 2021-12-07 NOTE — Patient Instructions (Signed)

## 2021-12-10 DIAGNOSIS — Z23 Encounter for immunization: Secondary | ICD-10-CM | POA: Diagnosis not present

## 2021-12-20 ENCOUNTER — Other Ambulatory Visit: Payer: Self-pay | Admitting: Internal Medicine

## 2021-12-23 ENCOUNTER — Other Ambulatory Visit: Payer: Self-pay | Admitting: Internal Medicine

## 2021-12-23 ENCOUNTER — Other Ambulatory Visit: Payer: Self-pay | Admitting: Cardiology

## 2021-12-23 DIAGNOSIS — I10 Essential (primary) hypertension: Secondary | ICD-10-CM

## 2022-01-19 ENCOUNTER — Encounter: Payer: Self-pay | Admitting: Internal Medicine

## 2022-02-11 ENCOUNTER — Ambulatory Visit
Admission: RE | Admit: 2022-02-11 | Discharge: 2022-02-11 | Disposition: A | Discharge status: Home / Self Care | Payer: Medicare Other | Source: Ambulatory Visit | Attending: Internal Medicine | Admitting: Internal Medicine

## 2022-02-11 DIAGNOSIS — Z87891 Personal history of nicotine dependence: Secondary | ICD-10-CM

## 2022-02-12 ENCOUNTER — Telehealth: Payer: Self-pay | Admitting: Acute Care

## 2022-02-12 DIAGNOSIS — Z87891 Personal history of nicotine dependence: Secondary | ICD-10-CM

## 2022-02-12 DIAGNOSIS — R911 Solitary pulmonary nodule: Secondary | ICD-10-CM

## 2022-02-12 NOTE — Telephone Encounter (Signed)
See other telephone note from 02/12/22

## 2022-02-12 NOTE — Telephone Encounter (Signed)
Sarah please advise on [facility] CT call report:   CLINICAL DATA:  76 year old asymptomatic female former smoker with 30 pack-year smoking history, quit smoking 14 years prior.   EXAM: CT CHEST WITHOUT CONTRAST LOW-DOSE FOR LUNG CANCER SCREENING   TECHNIQUE: Multidetector CT imaging of the chest was performed following the standard protocol without IV contrast.   RADIATION DOSE REDUCTION: This exam was performed according to the departmental dose-optimization program which includes automated exposure control, adjustment of the mA and/or kV according to patient size and/or use of iterative reconstruction technique.   COMPARISON:  02/11/2021 screening chest CT.   FINDINGS: Substantially motion degraded scan, limiting assessment.   Cardiovascular: Normal heart size. No significant pericardial effusion/thickening. Left anterior descending and right coronary atherosclerosis. Atherosclerotic nonaneurysmal thoracic aorta. Normal caliber pulmonary arteries.   Mediastinum/Nodes: No significant thyroid nodules. Unremarkable esophagus. No pathologically enlarged axillary, mediastinal or hilar lymph nodes, noting limited sensitivity for the detection of hilar adenopathy on this noncontrast study.   Lungs/Pleura: No pneumothorax. No pleural effusion. Moderate centrilobular emphysema with diffuse bronchial wall thickening. No acute consolidative airspace disease or lung masses. No significant growth of previously visualized pulmonary nodules. Widespread new patchy regions of tree-in-bud opacity, centrilobular nodularity and nodular foci of consolidation in both lungs, largest 16.2 mm in volume derived mean diameter in the peripheral right lower lobe (series 3/image 177). Associated mild scattered regions of cylindrical bronchiectasis in both lungs at the areas of tree-in-bud opacity. Increased bandlike consolidation and volume loss in the medial left upper lobe.   Upper abdomen:  Cholecystectomy. Surgical clips surround proximal stomach with gastric suture line from apparent bypass surgery, poorly evaluated on this scan.   Musculoskeletal: No aggressive appearing focal osseous lesions. Mild thoracic spondylosis.   IMPRESSION: 1. Substantially motion degraded scan, limiting assessment. 2. Lung-RADS 4B, suspicious. Additional imaging evaluation or consultation with Pulmonology or Thoracic Surgery recommended. Widespread new patchy regions of tree-in-bud opacity, centrilobular nodularity and nodular foci of consolidation in both lungs, largest 16.2 mm in volume derived mean diameter in the peripheral right lower lobe. Associated mild scattered regions of cylindrical bronchiectasis at the areas of tree-in-bud opacity. Findings are most suggestive of infectious or inflammatory process such as due to atypical mycobacterial infection (MAI). Suggest pulmonology consultation and follow-up high-resolution chest CT in 3 months. 3. Two-vessel coronary atherosclerosis. 4. Aortic Atherosclerosis (ICD10-I70.0) and Emphysema (ICD10-J43.9).  Thank you

## 2022-02-12 NOTE — Telephone Encounter (Signed)
I have called the patient with the results of her low-dose screening CT.  Her scan was read as a lung RADS 4B. Additional imaging evaluation or consultation with Pulmonology or Thoracic Surgery recommended. Widespread new patchy regions of tree-in-bud opacity, centrilobular nodularity and nodular foci of consolidation in both lungs, largest 16.2 mm in volume derived mean diameter in the peripheral right lower lobe. Associated mild scattered regions of cylindrical bronchiectasis at the areas of tree-in-bud opacity. Findings are most suggestive of infectious or inflammatory process such as due to atypical mycobacterial infection (MAI). Suggest pulmonology consultation and follow-up high-resolution chest CT in 3 months.  When I spoke with the patient she was able to confirm that she has been sick, and was sick at the time of the scan.  She endorses increased mucus and secretions.  I explained that this has probably because of the 4B reading. My plan was to refer the patient to pulmonary and then do a follow-up HRCT as suggested.  However the patient refused pulmonary referral.  She has asked me to send this information to her primary care doctor Dr. Billey Gosling.  Patient states that she is followed by pulmonary at [facility] however was told she did not need to follow with her but allow her primary care to maintain her. I explained that this does show some changes in her scan based on previous scans, it could indeed be related to the fact that she was sick when she had the scan. I told her from my standpoint we will repeat the scan in 3 months with the hope that she is better.  She is in agreement with this plan.  Dr. [name], I am including you on this as patient has requested that you be the person to follow-up regarding any pulmonary needs.  I did offer to have her seen however she declined. I will place the order for the 41-monthfollow-up.  Please let me know if you have any questions or concerns.  Thank  you so much  DLangley Gaussplease place order for 327-monthollow-up low-dose screening CT. Thank you so much

## 2022-02-12 NOTE — Telephone Encounter (Signed)
Order placed for 3 mth nodule f/u low dose CT.

## 2022-02-17 ENCOUNTER — Encounter: Payer: Self-pay | Admitting: Internal Medicine

## 2022-02-17 NOTE — Progress Notes (Signed)
Subjective:    Patient ID: April Combs, female    DOB: Apr 18, 1945, 76 y.o.   MRN: [id number]     HPI April Combs is here for follow up of her chronic medical problems, including htn, hld, DM, gout, gerd, hypothyroid, COPD, CKD, chronic back/left leg pain, morbid obesity  Doing ok.  Had a Ct of her lungs recently - to be repeated in 3 months.   Medications and allergies reviewed with patient and updated if appropriate.  Current Outpatient Medications on File Prior to Visit  Medication Sig Dispense Refill   acetaminophen (TYLENOL) 325 MG tablet Take 2 tablets (650 mg total) by mouth every 6 (six) hours as needed for mild pain (or Fever >/= 101).     albuterol (PROVENTIL) (2.5 MG/3ML) 0.083% nebulizer solution USE 1 VIAL IN NEBULIZER EVERY 6 HOURS AS NEEDED FOR WHEEZING FOR SHORTNESS OF BREATH 150 mL 0   albuterol (VENTOLIN HFA) 108 (90 Base) MCG/ACT inhaler Inhale 2 puffs into the lungs every 6 (six) hours as needed for wheezing or shortness of breath.     allopurinol (ZYLOPRIM) 300 MG tablet TAKE 1 TABLET BY MOUTH  DAILY 90 tablet 3   atorvastatin (LIPITOR) 20 MG tablet TAKE 1 TABLET BY MOUTH ONCE  DAILY 90 tablet 3   carvedilol (COREG) 12.5 MG tablet TAKE 1 AND 1/2 TABLETS BY  MOUTH TWICE DAILY WITH A  MEAL 270 tablet 3   Fluticasone-Umeclidin-Vilant (TRELEGY ELLIPTA) 100-62.5-25 MCG/ACT AEPB Inhale 1 puff into the lungs daily. Plans to start bevespi after completing rx for trelegy     furosemide (LASIX) 20 MG tablet Take 1 tablet (20 mg total) by mouth daily as needed. (Patient taking differently: Take 20 mg by mouth daily as needed for fluid.) 45 tablet 3   gabapentin (NEURONTIN) 100 MG capsule TAKE 1 CAPSULE BY MOUTH IN  THE MORNING AND 2 CAPSULES  BY MOUTH AT BEDTIME 270 capsule 3   levothyroxine (SYNTHROID) 50 MCG tablet Take 1 tablet by mouth once daily 90 tablet 0   lisinopril-hydrochlorothiazide (ZESTORETIC) 20-25 MG tablet Take 1 tablet by mouth daily. Please keep scheduled  appointment 90 tablet 0   omeprazole (PRILOSEC) 20 MG capsule TAKE 1 CAPSULE BY MOUTH  TWICE DAILY BEFORE A MEAL 180 capsule 3   vitamin B-12 (CYANOCOBALAMIN) 1000 MCG tablet Take 1 tablet (1,000 mcg total) by mouth daily.     No current facility-administered medications on file prior to visit.     Review of Systems  Constitutional:  Negative for chills and fever.  Respiratory:  Positive for shortness of breath (chronic - stable). Negative for cough and wheezing.   Cardiovascular:  Positive for palpitations (chronic). Negative for chest pain and leg swelling.  Neurological:  Positive for headaches. Negative for light-headedness.       Objective:   Vitals:   02/19/22 0742  BP: 138/72  Pulse: (!) 50  Temp: 97.6 F (36.4 C)  SpO2: 92%   BP Readings from Last 3 Encounters:  02/19/22 138/72  08/20/21 116/70  05/15/21 116/82   Wt Readings from Last 3 Encounters:  02/19/22 208 lb (94.3 kg)  12/07/21 208 lb (94.3 kg)  08/20/21 208 lb (94.3 kg)   Body mass index is 34.61 kg/m.    Physical Exam Constitutional:      General: She is not in acute distress.    Appearance: Normal appearance.  HENT:     Head: Normocephalic and atraumatic.  Eyes:     Conjunctiva/sclera:  Conjunctivae normal.  Cardiovascular:     Rate and Rhythm: Normal rate and regular rhythm.     Heart sounds: Normal heart sounds. No murmur heard. Pulmonary:     Effort: Pulmonary effort is normal. No respiratory distress.     Breath sounds: Wheezing (ne wheeze right base - no other wheezes heard) present.  Musculoskeletal:     Cervical back: Neck supple.     Right lower leg: No edema.     Left lower leg: No edema.  Lymphadenopathy:     Cervical: No cervical adenopathy.  Skin:    General: Skin is warm and dry.     Findings: No rash.  Neurological:     Mental Status: She is alert. Mental status is at baseline.  Psychiatric:        Mood and Affect: Mood normal.        Behavior: Behavior normal.         Lab Results  Component Value Date   WBC 6.3 08/20/2021   HGB 12.2 08/20/2021   HCT 36.9 08/20/2021   PLT 192.0 08/20/2021   GLUCOSE 98 08/20/2021   CHOL 159 08/20/2021   TRIG 167.0 (H) 08/20/2021   HDL 56.90 08/20/2021   LDLDIRECT 74.0 08/20/2020   LDLCALC 69 08/20/2021   ALT 13 08/20/2021   AST 18 08/20/2021   NA 141 08/20/2021   K 4.0 08/20/2021   CL 106 08/20/2021   CREATININE 0.99 08/20/2021   BUN 26 (H) 08/20/2021   CO2 25 08/20/2021   TSH 2.55 08/20/2021   INR 1.08 01/28/2014   HGBA1C 6.3 08/20/2021   MICROALBUR <0.7 08/20/2021     Assessment & Plan:    See Problem List for Assessment and Plan of chronic medical problems.

## 2022-02-17 NOTE — Patient Instructions (Addendum)
      Blood work was ordered.   The lab is on the first floor.     Medications changes include :   none      Return in about 6 months (around 08/21/2022) for follow up.

## 2022-02-19 ENCOUNTER — Ambulatory Visit (INDEPENDENT_AMBULATORY_CARE_PROVIDER_SITE_OTHER): Payer: Medicare Other | Admitting: Internal Medicine

## 2022-02-19 VITALS — BP 138/72 | HR 50 | Temp 97.6°F | Ht 65.0 in | Wt 208.0 lb

## 2022-02-19 DIAGNOSIS — N1832 Chronic kidney disease, stage 3b: Secondary | ICD-10-CM | POA: Diagnosis not present

## 2022-02-19 DIAGNOSIS — M5416 Radiculopathy, lumbar region: Secondary | ICD-10-CM | POA: Diagnosis not present

## 2022-02-19 DIAGNOSIS — E119 Type 2 diabetes mellitus without complications: Secondary | ICD-10-CM

## 2022-02-19 DIAGNOSIS — E039 Hypothyroidism, unspecified: Secondary | ICD-10-CM | POA: Diagnosis not present

## 2022-02-19 DIAGNOSIS — J449 Chronic obstructive pulmonary disease, unspecified: Secondary | ICD-10-CM | POA: Diagnosis not present

## 2022-02-19 DIAGNOSIS — E785 Hyperlipidemia, unspecified: Secondary | ICD-10-CM

## 2022-02-19 DIAGNOSIS — M109 Gout, unspecified: Secondary | ICD-10-CM

## 2022-02-19 DIAGNOSIS — K219 Gastro-esophageal reflux disease without esophagitis: Secondary | ICD-10-CM | POA: Diagnosis not present

## 2022-02-19 DIAGNOSIS — I10 Essential (primary) hypertension: Secondary | ICD-10-CM | POA: Diagnosis not present

## 2022-02-19 LAB — COMPREHENSIVE METABOLIC PANEL
ALT: 11 U/L (ref 0–35)
AST: 18 U/L (ref 0–37)
Albumin: 4.2 g/dL (ref 3.5–5.2)
Alkaline Phosphatase: 78 U/L (ref 39–117)
BUN: 25 mg/dL — ABNORMAL HIGH (ref 6–23)
CO2: 26 mEq/L (ref 19–32)
Calcium: 9.6 mg/dL (ref 8.4–10.5)
Chloride: 107 mEq/L (ref 96–112)
Creatinine, Ser: 0.88 mg/dL (ref 0.40–1.20)
GFR: 64.25 mL/min (ref >60.00)
Glucose, Bld: 103 mg/dL — ABNORMAL HIGH (ref 70–99)
Potassium: 3.9 mEq/L (ref 3.5–5.1)
Sodium: 143 mEq/L (ref 135–145)
Total Bilirubin: 0.4 mg/dL (ref 0.2–1.2)
Total Protein: 7 g/dL (ref 6.0–8.3)

## 2022-02-19 LAB — HEMOGLOBIN A1C: Hgb A1c MFr Bld: 6 % (ref 4.6–6.5)

## 2022-02-19 LAB — LIPID PANEL
Cholesterol: 155 mg/dL (ref 0–200)
HDL: 65 mg/dL (ref >39.00)
LDL Cholesterol: 56 mg/dL (ref 0–99)
NonHDL: 90.37
Total CHOL/HDL Ratio: 2
Triglycerides: 172 mg/dL — ABNORMAL HIGH (ref 0.0–149.0)
VLDL: 34.4 mg/dL (ref 0.0–40.0)

## 2022-02-19 LAB — TSH: TSH: 3.2 u[IU]/mL (ref 0.35–5.50)

## 2022-02-19 LAB — VITAMIN D 25 HYDROXY (VIT D DEFICIENCY, FRACTURES): VITD: 16.56 ng/mL — ABNORMAL LOW (ref 30.00–100.00)

## 2022-02-19 NOTE — Assessment & Plan Note (Signed)
Chronic Blood pressure well controlled CMP Continue Coreg 18.75 mg twice daily, lisinopril-HCTZ 20-25 mg daily

## 2022-02-19 NOTE — Assessment & Plan Note (Signed)
Chronic  Clinically euthyroid Check tsh and will titrate med dose if needed Currently taking levothyroxine 50 mcg daily 

## 2022-02-19 NOTE — Assessment & Plan Note (Signed)
Chronic Stable, controlled Chronic shortness of breath Continue Trelegy daily, albuterol as needed

## 2022-02-19 NOTE — Assessment & Plan Note (Signed)
Chronic No recent gout flares Continue allopurinol 300 mg daily

## 2022-02-19 NOTE — Assessment & Plan Note (Signed)
Chronic Stable CMP, CBC 

## 2022-02-19 NOTE — Assessment & Plan Note (Signed)
Chronic Controlled, Stable Continue gabapentin 100 mg in the morning, 200 mg at bedtime

## 2022-02-19 NOTE — Assessment & Plan Note (Signed)
Chronic Encouraged weight loss Encouraged decrease portions, diabetic diet Limited in her ability to be active

## 2022-02-19 NOTE — Assessment & Plan Note (Signed)
Chronic GERD controlled Continue omeprazole 20 mg daily  

## 2022-02-19 NOTE — Assessment & Plan Note (Signed)
Chronic °Regular exercise and healthy diet encouraged °Check lipid panel  °Continue atorvastatin 20 mg daily °

## 2022-02-19 NOTE — Assessment & Plan Note (Signed)
Chronic   Lab Results  Component Value Date   HGBA1C 6.3 08/20/2021   Sugars well controlled Check A1c Continue lifestyle control-was on Rybelsus, but this was too expensive so we discontinued it Stressed regular exercise, diabetic diet

## 2022-02-21 MED ORDER — VITAMIN D (ERGOCALCIFEROL) 1.25 MG (50000 UNIT) PO CAPS: 50000.0000 [IU] | ORAL_CAPSULE | ORAL | 0 refills | Status: AC

## 2022-02-21 NOTE — Addendum Note (Signed)
Addended by: Binnie Rail on: 02/21/2022 11:25 AM   Modules accepted: Orders

## 2022-02-25 ENCOUNTER — Other Ambulatory Visit: Payer: Self-pay | Admitting: Cardiology

## 2022-02-25 DIAGNOSIS — I10 Essential (primary) hypertension: Secondary | ICD-10-CM

## 2022-03-20 ENCOUNTER — Other Ambulatory Visit: Payer: Self-pay | Admitting: Internal Medicine

## 2022-03-23 ENCOUNTER — Ambulatory Visit: Payer: Medicare Other | Attending: Cardiology | Admitting: Cardiology

## 2022-03-23 ENCOUNTER — Encounter: Payer: Self-pay | Admitting: Cardiology

## 2022-03-23 VITALS — BP 128/74 | HR 77 | Ht 65.0 in | Wt 208.0 lb

## 2022-03-23 DIAGNOSIS — I5032 Chronic diastolic (congestive) heart failure: Secondary | ICD-10-CM | POA: Diagnosis not present

## 2022-03-23 DIAGNOSIS — I499 Cardiac arrhythmia, unspecified: Secondary | ICD-10-CM | POA: Diagnosis not present

## 2022-03-23 DIAGNOSIS — I110 Hypertensive heart disease with heart failure: Secondary | ICD-10-CM | POA: Insufficient documentation

## 2022-03-23 NOTE — Patient Instructions (Signed)

## 2022-03-23 NOTE — Progress Notes (Signed)
Primary Care Provider: Binnie Rail, MD [place] Cardiologist: Glenetta Hew, MD Electrophysiologist: None  Clinic Note: No chief complaint on file. ==================================  ASSESSMENT/PLAN   Problem List Items Addressed This Visit       Cardiology Problems   Hypertensive 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 (HCC) (Chronic)    BP looks well-controlled on current dose of carvedilol and lisinopril and HCTZ.  Not requiring a significant amount of loop diuretic.  Maybe 1 or 2 times a month.      Relevant Orders   EKG 12-Lead (Completed)   Chronic heart failure with preserved ejection fraction (HFpEF) (HCC) - Primary (Chronic)    NYHA class II symptoms though I think most of April Combs exertional dyspnea is related to deconditioning, obesity and COPD more so than CHF.  No PND but does have some orthopnea.  No real edema.  April Combs is taking April Combs Lasix maybe 2 or 3 times a month.  Plan: Continue current regimen. On carvedilol 18.75 mg twice daily along with the lisinopril HCTZ 20-25 mg daily. PRN Lasix.        Other   Morbid obesity ([place]) (Chronic)    Notable weight loss.  Doing much better.  This has helped April Combs energy level as well as April Combs dyspnea.  Congratulated April Combs efforts.  April Combs still has a ways to go but doing well.      Irregular heart beat (Chronic)    Overall pretty well-controlled with Carvedilol.  April Combs just has short little bursts of fast heart rates that seem to be pretty short-lived.      Relevant Orders   EKG 12-Lead (Completed)   ===================================  HPI:    April Combs is a 77 y.o. female with a PMH Notable for Mild Hypertensive Heart Disease with Chronic HFpEF, COPD/emphysema, Obesity who presents today for 18 month f/u.  Patient is a well-developed month term patient of mine-Frank, who passed away in the summer 2020.  Bobby Ragan was last seen on September 29, 2020.  April Combs is try to work on  weight loss.  Blood pressure stable.  Lipids are well-controlled on atorvastatin.  NYHA class I-II symptoms of HFpEF with no orthopnea PND with occasional edema mostly exertional dyspnea.  Probably more related to obesity, deconditioning and lung disease.  Recent Hospitalizations: N/A  Reviewed  CV studies:    The following studies were reviewed today: (if available, images/films reviewed: From Epic Chart or Care Everywhere) None:  Interval History:   April Combs returns here today for delayed annual follow-up doing fairly well overall.  April Combs had a pretty long run with COVID in August 2022 and then again in February 2023.  April Combs had a pretty rough course with both of these.  April Combs has been on Ozempic, but then was probably stopped.  April Combs only took it for about a month or so.  But despite that, April Combs has lost significant weight.  April Combs is plateaued at about 208 pounds now.  Overall April Combs is doing fairly well although April Combs has off-and-on palpitations just feels like a brief little bursts.  Nothing prolonged, just feels like April Combs is running when April Combs is not.. April Combs sleeps on 4 pillows more because of GERD.  No PND with trivial edema.  April Combs mostly gets short of breath if April Combs is carrying something or if it is going up stairs or open incline.  Does not really have exertional dyspnea but simply walking around the house.  No chest pain or  pressure.  CV Review of Symptoms (Summary): positive for - dyspnea on exertion, orthopnea, palpitations, and rapid heart rate negative for - chest pain, edema, irregular heartbeat, paroxysmal nocturnal dyspnea, shortness of breath, or syncope or near syncope, TIA/amaurosis fugax, claudication  REVIEWED OF SYSTEMS   Review of Systems  Constitutional:  Positive for weight loss (30 pound weight loss since I last saw April Combs.  Dietary modification.  Short of Ozempic.). Negative for malaise/fatigue (Still has some fatigue, energy level definitely improving.).  HENT:  Negative for  congestion and nosebleeds.   Respiratory:  Negative for cough and shortness of breath (Exertional dyspnea per HPI).   Cardiovascular:  Negative for leg swelling.  Gastrointestinal:  Negative for abdominal pain, blood in stool and melena.  Genitourinary:  Negative for hematuria.  Musculoskeletal:  Positive for back pain and joint pain. Negative for falls.  Neurological:  Positive for dizziness (Poor balance with unsteady gait.  Doing better.). Negative for focal weakness and weakness.  Psychiatric/Behavioral:  Negative for depression (Actually not really having depression.  Maybe some mild dysthymia.) and memory loss. The patient is not nervous/anxious and does not have insomnia.    I have reviewed and (if needed) personally updated the patient's problem list, medications, allergies, past medical and surgical history, social and family history.   PAST MEDICAL HISTORY   Past Medical History:  Diagnosis Date   Anemia    Anxiety    BRBPR (bright red blood per rectum) 02/05/2015   Coronary artery disease (CAD) excluded 01/2014   Low Risk Myoview (false positive) with possible inferior-inferolateral ischemia, but with nonsustained VT --> CATH --> Angiographically Normal Coronary Arteries; coronary CT angiogram showed mild approximately disease but otherwise no obstructive disease.  Coronary calcium score 40.   Depression    FALSE POSITIVE NUCLEAR STRESS CHEST 01/28/2014   Fungal dermatitis 09/29/2011   GERD (gastroesophageal reflux disease)    Gout 04/10/2017   First episode - Left foot 03/2017   Hyperlipidemia    Hyperlipidemia with target LDL less than 100 09/29/2011   Hyperplastic colon polyp    Hypertension    Hypertensive 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 ([place]) 04/26/2017   HYPERTRIGLYCERIDEMIA 07/12/2008   Qualifier: Diagnosis of  By: Birdie Riddle MD, Katherine     Hypothyroidism 02/09/2017   Irregular heart beat 09/29/2011   Lumbar radiculopathy 12/24/2014   Morbid  obesity ([place]) 10/15/2015   NICM (nonischemic cardiomyopathy) ([place])    Essentially resolved. Echocardiogram July 2013 showed normal EF greater than 55%. Important septal motion. Normal LV filling pressures. Mild MR and mild anterior MVP   Obesity (BMI 30-39.9)    Osteoarthritis    Positional vertigo    Prediabetes 08/10/2017   Pulmonary emphysema ([place]) 04/29/2015   PFTs 2017 - moderate-severe obstructive disease, some restrictive disease   SOB (shortness of breath) 08/23/2011    PAST SURGICAL HISTORY   Past Surgical History:  Procedure Laterality Date   ABDOMINAL HYSTERECTOMY     APPENDECTOMY  as child   BREAST BIOPSY Right    benign   CHOLECYSTECTOMY     COLONOSCOPY     CORONARY CALCIUM SCORE/CT ANGIOGRAM  07/2017   Calcium score 40.  Mild LAD stenosis, but no other significant disease.   DOPPLER ECHOCARDIOGRAPHY  July 2013   09/20/11. normal Lv thickness and function with EF greater thatn 55%   Exercise tolerance test - CPET-MET-TEST  July 2013   Submaximal effort. Only 80% of April Combs, therefore peak VO2 of 75% is likely an  underestimate. High resting heart rate, achieving 86% of heart rate. --> Unable to interpret due to lack of effort.   GASTRIC BYPASS  1980   GASTRIC BYPASS     KIDNEY STONE SURGERY     LEFT HEART CATHETERIZATION WITH CORONARY ANGIOGRAM N/A 02/01/2014   Procedure: LEFT HEART CATHETERIZATION WITH CORONARY ANGIOGRAM;  Surgeon: Leonie Man, MD;  Location[facility] C[facility] LAB;  Service: Cardiovascular: Angiographically normal coronaries   NM MYOVIEW LTD  November 2015   4:20 min, 4.6 METS --> DOE, but no chest discomfort; Significant + ST -T wave changes noted with NSVT & PVCs. Images suggest Inferior-inferolateral ischemia.  Low Risk. -- FALSE POSITIVE   Pulmonary Function Tests  July 2013   Increased densities, decreased FVC - consistent with obesity hypoventilation; FEV1 is 58%, FVC 60% of predicted.   TRANSTHORACIC ECHOCARDIOGRAM  04/2017   Mildly reduced EF of 45%.  No  regional wall motion normality.  GR 1 DD   UPPER GASTROINTESTINAL ENDOSCOPY      Immunization History  Administered Date(s) Administered   Fluad Quad(high Dose 65+) 11/09/2018   Influenza, High Dose Seasonal PF 01/28/2015, 12/24/2015, 12/13/2016, 11/16/2017, 12/28/2019   Influenza,inj,Quad PF,6+ Mos 12/24/2015   Influenza-Unspecified 12/11/2021   Janssen (J&J) SARS-COV-2 Vaccination 06/01/2019, 12/28/2019   Pneumococcal Conjugate-13 09/19/2013   Pneumococcal Polysaccharide-23 09/25/2014   Tdap 02/29/2008    MEDICATIONS/ALLERGIES   Current Meds  Medication Sig   acetaminophen (TYLENOL) 325 MG tablet Take 2 tablets (650 mg total) by mouth every 6 (six) hours as needed for mild pain (or Fever >/= 101).   albuterol (PROVENTIL) (2.5 MG/3ML) 0.083% nebulizer solution USE 1 VIAL IN NEBULIZER EVERY 6 HOURS AS NEEDED FOR WHEEZING FOR SHORTNESS OF BREATH   albuterol (VENTOLIN HFA) 108 (90 Base) MCG/ACT inhaler Inhale 2 puffs into the lungs every 6 (six) hours as needed for wheezing or shortness of breath.   allopurinol (ZYLOPRIM) 300 MG tablet TAKE 1 TABLET BY MOUTH  DAILY   atorvastatin (LIPITOR) 20 MG tablet TAKE 1 TABLET BY MOUTH ONCE  DAILY   carvedilol (COREG) 12.5 MG tablet TAKE 1 AND 1/2 TABLETS BY  MOUTH TWICE DAILY WITH A  MEAL   Fluticasone-Umeclidin-Vilant (TRELEGY ELLIPTA) 100-62.5-25 MCG/ACT AEPB Inhale 1 puff into the lungs daily. Plans to start bevespi after completing rx for trelegy   furosemide (LASIX) 20 MG tablet Take 1 tablet (20 mg total) by mouth daily as needed. (Patient taking differently: Take 20 mg by mouth daily as needed for fluid.)   gabapentin (NEURONTIN) 100 MG capsule TAKE 1 CAPSULE BY MOUTH IN  THE MORNING AND 2 CAPSULES  BY MOUTH AT BEDTIME   levothyroxine (SYNTHROID) 50 MCG tablet Take 1 tablet by mouth once daily   lisinopril-hydrochlorothiazide (ZESTORETIC) 20-25 MG tablet TAKE 1 TABLET BY MOUTH DAILY   omeprazole (PRILOSEC) 20 MG capsule TAKE 1 CAPSULE BY  MOUTH  TWICE DAILY BEFORE A MEAL   vitamin B-12 (CYANOCOBALAMIN) 1000 MCG tablet Take 1 tablet (1,000 mcg total) by mouth daily.   Vitamin D, Ergocalciferol, (DRISDOL) 1.25 MG (50000 UNIT) CAPS capsule Take 1 capsule (50,000 Units total) by mouth every 7 (seven) days.    Allergies  Allergen Reactions   Semaglutide Diarrhea    Rybelsus - nausea and diarrhea    SOCIAL HISTORY/FAMILY HISTORY   Reviewed in Epic:  Pertinent findings:  Social History   Tobacco Use   Smoking status: Former    Packs/day: 1.00    Years: 30.00    Total pack  years: 30.00    Types: Cigarettes    Quit date: 01/18/2003    Years since quitting: 19.2   Smokeless tobacco: Never  Vaping Use   Vaping Use: Never used  Substance Use Topics   Alcohol use: Yes    Alcohol/week: 3.0 standard drinks of alcohol    Types: 3 Cans of beer per week    Comment: drinks 2-3 beers a week   Drug use: No   Social History   Social History Narrative   April Combs is widowed-husband, [name], was also a patient of Dr. Ellyn Hack.   Mother of 3, grandmother 29.   Quit smoking in 2005.    Social alcohol.   No routine exercise.    OBJCTIVE -PE, EKG, labs   Wt Readings from Last 3 Encounters:  03/23/22 208 lb (94.3 kg)  02/19/22 208 lb (94.3 kg)  12/07/21 208 lb (94.3 kg)  09/29/2020 BP 118/78 :  Pulse 86, Resp 18, Ht '5\' 5"'$  (1.651 m); Wt 239 lb 12.8 oz (108.8 kg), SpO2 97%, BMI 39.90 kg/m, BSA 2.23 m  Physical Exam: BP 128/74   Pulse 77   Ht '5\' 5"'$  (1.651 m)   Wt 208 lb (94.3 kg)   SpO2 96%   BMI 34.61 kg/m  Physical Exam Vitals reviewed.  Constitutional:      General: April Combs is not in acute distress.    Appearance: April Combs is obese. April Combs is not ill-appearing or toxic-appearing.     Comments: Obese but notably lighter than last time I saw April Combs.  Well-groomed.  HENT:     Head: Normocephalic and atraumatic.  Neck:     Vascular: No carotid bruit or JVD.  Cardiovascular:     Rate and Rhythm: Regular rhythm. No extrasystoles  are present.    Chest Wall: PMI is not displaced (Difficult to palpate).     Pulses: Normal pulses and intact distal pulses.     Heart sounds: S1 normal and S2 normal. Heart sounds are distant. Murmur (1/6 SEM at RUSB-neck.) heard.     No friction rub. No gallop.  Pulmonary:     Effort: Pulmonary effort is normal. No respiratory distress.     Breath sounds: No wheezing, rhonchi or rales.     Comments: This breath sounds.  Mild adventitious sounds but no wheezes rales or rhonchi. Chest:     Chest wall: No tenderness.  Musculoskeletal:        General: No swelling. Normal range of motion.     Cervical back: Normal range of motion and neck supple.  Skin:    General: Skin is warm and dry.     Coloration: Skin is not pale.  Neurological:     General: No focal deficit present.     Mental Status: April Combs is alert and oriented to person, place, and time.  Psychiatric:        Mood and Affect: Mood normal.        Behavior: Behavior normal.        Thought Content: Thought content normal.        Judgment: Judgment normal.     Adult ECG Report  Rate: 77 ;  Rhythm: normal sinus rhythm and normal axis, intervals & durations ;   Narrative Interpretation: n/a  Recent Labs:  reviewed  Lab Results  Component Value Date   CHOL 155 02/19/2022   HDL 65.00 02/19/2022   LDLCALC 56 02/19/2022   TRIG 172.0 (H) 02/19/2022   CHOLHDL 2 02/19/2022   Lab  Results  Component Value Date   CREATININE 0.88 02/19/2022   BUN 25 (H) 02/19/2022   NA 143 02/19/2022   K 3.9 02/19/2022   CL 107 02/19/2022   CO2 26 02/19/2022      Latest Ref Rng & Units 08/20/2021    8:21 AM 03/14/2021    5:02 PM 02/19/2021    9:03 AM  CBC  WBC 4.0 - 10.5 K/uL 6.3  9.9  4.6   Hemoglobin 12.0 - 15.0 g/dL 12.2  11.4  12.7   Hematocrit 36.0 - 46.0 % 36.9  33.7  38.0   Platelets 150.0 - 400.0 K/uL 192.0  218  158.0     Lab Results  Component Value Date   HGBA1C 6.0 02/19/2022   Lab Results  Component Value Date   TSH 3.20  02/19/2022    ================================================== I spent a total of 18 minutes with the patient spent in direct patient consultation.  Additional time spent with chart review  / charting (studies, outside notes, etc): 13 min Total Time: 31 min  Current medicines are reviewed at length with the patient today.  (+/- concerns) N/A  Notice: This dictation was prepared with Dragon dictation along with smart phrase technology. Any transcriptional errors that result from this process are unintentional and may not be corrected upon review.  Studies Ordered:   Orders Placed This Encounter  Procedures   EKG 12-Lead   No orders of the defined types were placed in this encounter.   Patient Instructions / Medication Changes & Studies & Tests Ordered   Patient Instructions  Medication Instructions:  No changes  *If you need a refill on your cardiac medications before your next appointment, please call your pharmacy*   Lab Work: Not needed    Testing/Procedures:  Not needed  Follow-Up: At [facility], you and your health needs are our priority.  As part of our continuing mission to provide you with exceptional heart care, we have created designated Provider Care Teams.  These Care Teams include your primary Cardiologist (physician) and Advanced Practice Providers (APPs -  Physician Assistants and Nurse Practitioners) who all work together to provide you with the care you need, when you need it.     Your next appointment:   12 month(s)  The format for your next appointment:   In Person  Provider:   Glenetta Hew, MD       Leonie Man, MD, MS Glenetta Hew, M.D., M.S. Interventional Cardiologist  [place]  Pager # [phone] Phone # [phone] [address]. [place], [place] [address]   Thank you for choosing [facility] [place]!!

## 2022-04-04 ENCOUNTER — Encounter: Payer: Self-pay | Admitting: Cardiology

## 2022-04-04 NOTE — Assessment & Plan Note (Signed)
Overall pretty well-controlled with Carvedilol.  She just has short little bursts of fast heart rates that seem to be pretty short-lived.

## 2022-04-04 NOTE — Assessment & Plan Note (Signed)
NYHA class II symptoms though I think most of her exertional dyspnea is related to deconditioning, obesity and COPD more so than CHF.  No PND but does have some orthopnea.  No real edema.  She is taking her Lasix maybe 2 or 3 times a month.  Plan: Continue current regimen. On carvedilol 18.75 mg twice daily along with the lisinopril HCTZ 20-25 mg daily. PRN Lasix.

## 2022-04-04 NOTE — Assessment & Plan Note (Signed)
>>  ASSESSMENT AND PLAN FOR CHRONIC HEART FAILURE WITH PRESERVED EJECTION FRACTION (HFPEF) (HCC) WRITTEN ON 04/04/2022 10:17 PM BY HARDING, DAVID W, MD  NYHA class II symptoms though I think most of her exertional dyspnea is related to deconditioning, obesity and COPD more so than CHF.  No PND but does have some orthopnea.  No real edema.  She is taking her Lasix  maybe 2 or 3 times a month.  Plan: Continue current regimen. On carvedilol  18.75 mg twice daily along with the lisinopril  HCTZ 20-25 mg daily. PRN Lasix .

## 2022-04-04 NOTE — Assessment & Plan Note (Signed)
BP looks well-controlled on current dose of carvedilol and lisinopril and HCTZ.  Not requiring a significant amount of loop diuretic.  Maybe 1 or 2 times a month.

## 2022-04-04 NOTE — Assessment & Plan Note (Signed)
Notable weight loss.  Doing much better.  This has helped her energy level as well as her dyspnea.  Congratulated her efforts.  She still has a ways to go but doing well.

## 2022-04-20 ENCOUNTER — Other Ambulatory Visit: Payer: Self-pay | Admitting: Internal Medicine

## 2022-05-14 ENCOUNTER — Ambulatory Visit
Admission: RE | Admit: 2022-05-14 | Discharge: 2022-05-14 | Disposition: A | Discharge status: Home / Self Care | Payer: Medicare Other | Source: Ambulatory Visit | Attending: Acute Care | Admitting: Acute Care

## 2022-05-14 DIAGNOSIS — J439 Emphysema, unspecified: Secondary | ICD-10-CM | POA: Diagnosis not present

## 2022-05-14 DIAGNOSIS — Z87891 Personal history of nicotine dependence: Secondary | ICD-10-CM

## 2022-05-14 DIAGNOSIS — R911 Solitary pulmonary nodule: Secondary | ICD-10-CM | POA: Diagnosis not present

## 2022-05-14 DIAGNOSIS — I700 Atherosclerosis of aorta: Secondary | ICD-10-CM | POA: Diagnosis not present

## 2022-05-21 ENCOUNTER — Other Ambulatory Visit: Payer: Self-pay | Admitting: Cardiology

## 2022-05-21 DIAGNOSIS — I10 Essential (primary) hypertension: Secondary | ICD-10-CM

## 2022-05-24 ENCOUNTER — Other Ambulatory Visit: Payer: Self-pay | Admitting: Internal Medicine

## 2022-05-24 ENCOUNTER — Encounter: Payer: Self-pay | Admitting: Internal Medicine

## 2022-05-24 DIAGNOSIS — R911 Solitary pulmonary nodule: Secondary | ICD-10-CM

## 2022-05-24 DIAGNOSIS — R918 Other nonspecific abnormal finding of lung field: Secondary | ICD-10-CM

## 2022-05-25 ENCOUNTER — Other Ambulatory Visit: Payer: Self-pay

## 2022-05-25 ENCOUNTER — Telehealth: Payer: Self-pay | Admitting: Acute Care

## 2022-05-25 DIAGNOSIS — Z87891 Personal history of nicotine dependence: Secondary | ICD-10-CM

## 2022-05-25 DIAGNOSIS — R911 Solitary pulmonary nodule: Secondary | ICD-10-CM

## 2022-05-25 MED ORDER — ALBUTEROL SULFATE (2.5 MG/3ML) 0.083% IN NEBU: 2.5000 mg | INHALATION_SOLUTION | Freq: Four times a day (QID) | RESPIRATORY_TRACT | 0 refills | Status: DC | PRN

## 2022-05-25 NOTE — Telephone Encounter (Signed)
Order placed for 6 months LDCT for nodule follow up and results/plan faxed to PCP

## 2022-05-25 NOTE — Telephone Encounter (Signed)
I have called the patient with the results of her low-dose screening CT.  I explained her scan was read as a lung RADS 3. This scan was a 87-monthfollow-up to 1 done in November which was read as a 4B. Patient has innumerable clustered tree-in-bud nodules and patchy linear consolidations that are not significantly changed when compared to her prior exam.  Radiology feels that this is related to aspiration or an atypical infection.  I offered the patient an appointment on Friday to come and see me to be evaluated and potentially treated with antibiotics.  She takes care of her grandchildren and states that she is unable to get any appointments until April 16. She has called her primary care for a new referral [facility] OCarita Pian and she is hoping to get into see them April 16. I discussed that I think she has bronchiectasis and that she could potentially be aspirating although she denies getting choked on her food I wonder if she is potentially aspirating on her secretions when she sleeps.  I am glad that she will be evaluated [facility]. Plan from our perspective is for a 666-monthollow-up low-dose screening CT. Hopefully she will have seen her pulmonary physician by that time, and neck scan will show some improvement. Denise 6-36-monthllow-up low-dose screening CT and fax results to PCP.  Let PCP know plan is for her to follow-up with her pulmonary doctor at [facility] she was unable to take the appointment offered in [place] much

## 2022-06-14 ENCOUNTER — Other Ambulatory Visit: Payer: Self-pay | Admitting: Internal Medicine

## 2022-06-15 ENCOUNTER — Other Ambulatory Visit: Payer: Self-pay | Admitting: Internal Medicine

## 2022-06-27 ENCOUNTER — Other Ambulatory Visit: Payer: Self-pay

## 2022-06-27 ENCOUNTER — Inpatient Hospital Stay (HOSPITAL_BASED_OUTPATIENT_CLINIC_OR_DEPARTMENT_OTHER)
Admission: EM | Admit: 2022-06-27 | Discharge: 2022-06-30 | DRG: 194 | Disposition: A | Discharge status: Home / Self Care | Payer: Medicare Other | Attending: Internal Medicine | Admitting: Internal Medicine

## 2022-06-27 ENCOUNTER — Emergency Department (HOSPITAL_BASED_OUTPATIENT_CLINIC_OR_DEPARTMENT_OTHER): Payer: Medicare Other

## 2022-06-27 ENCOUNTER — Encounter (HOSPITAL_BASED_OUTPATIENT_CLINIC_OR_DEPARTMENT_OTHER): Payer: Self-pay

## 2022-06-27 DIAGNOSIS — R7989 Other specified abnormal findings of blood chemistry: Secondary | ICD-10-CM | POA: Diagnosis present

## 2022-06-27 DIAGNOSIS — D649 Anemia, unspecified: Secondary | ICD-10-CM | POA: Diagnosis not present

## 2022-06-27 DIAGNOSIS — I2583 Coronary atherosclerosis due to lipid rich plaque: Secondary | ICD-10-CM | POA: Diagnosis not present

## 2022-06-27 DIAGNOSIS — I2510 Atherosclerotic heart disease of native coronary artery without angina pectoris: Secondary | ICD-10-CM | POA: Diagnosis not present

## 2022-06-27 DIAGNOSIS — Z1152 Encounter for screening for COVID-19: Secondary | ICD-10-CM

## 2022-06-27 DIAGNOSIS — J439 Emphysema, unspecified: Secondary | ICD-10-CM | POA: Diagnosis present

## 2022-06-27 DIAGNOSIS — Z79890 Hormone replacement therapy (postmenopausal): Secondary | ICD-10-CM

## 2022-06-27 DIAGNOSIS — Z87442 Personal history of urinary calculi: Secondary | ICD-10-CM

## 2022-06-27 DIAGNOSIS — E781 Pure hyperglyceridemia: Secondary | ICD-10-CM | POA: Diagnosis present

## 2022-06-27 DIAGNOSIS — I5042 Chronic combined systolic (congestive) and diastolic (congestive) heart failure: Secondary | ICD-10-CM | POA: Diagnosis present

## 2022-06-27 DIAGNOSIS — Z820 Family history of epilepsy and other diseases of the nervous system: Secondary | ICD-10-CM

## 2022-06-27 DIAGNOSIS — Z833 Family history of diabetes mellitus: Secondary | ICD-10-CM

## 2022-06-27 DIAGNOSIS — Z87891 Personal history of nicotine dependence: Secondary | ICD-10-CM

## 2022-06-27 DIAGNOSIS — Z8719 Personal history of other diseases of the digestive system: Secondary | ICD-10-CM

## 2022-06-27 DIAGNOSIS — R7303 Prediabetes: Secondary | ICD-10-CM | POA: Diagnosis present

## 2022-06-27 DIAGNOSIS — Z8249 Family history of ischemic heart disease and other diseases of the circulatory system: Secondary | ICD-10-CM

## 2022-06-27 DIAGNOSIS — R778 Other specified abnormalities of plasma proteins: Secondary | ICD-10-CM | POA: Diagnosis not present

## 2022-06-27 DIAGNOSIS — E785 Hyperlipidemia, unspecified: Secondary | ICD-10-CM | POA: Diagnosis not present

## 2022-06-27 DIAGNOSIS — Z79899 Other long term (current) drug therapy: Secondary | ICD-10-CM

## 2022-06-27 DIAGNOSIS — J441 Chronic obstructive pulmonary disease with (acute) exacerbation: Secondary | ICD-10-CM | POA: Diagnosis present

## 2022-06-27 DIAGNOSIS — I493 Ventricular premature depolarization: Secondary | ICD-10-CM | POA: Diagnosis not present

## 2022-06-27 DIAGNOSIS — I110 Hypertensive heart disease with heart failure: Secondary | ICD-10-CM | POA: Diagnosis not present

## 2022-06-27 DIAGNOSIS — I5033 Acute on chronic diastolic (congestive) heart failure: Secondary | ICD-10-CM | POA: Diagnosis not present

## 2022-06-27 DIAGNOSIS — D638 Anemia in other chronic diseases classified elsewhere: Secondary | ICD-10-CM | POA: Diagnosis present

## 2022-06-27 DIAGNOSIS — M1990 Unspecified osteoarthritis, unspecified site: Secondary | ICD-10-CM | POA: Diagnosis present

## 2022-06-27 DIAGNOSIS — I10 Essential (primary) hypertension: Secondary | ICD-10-CM | POA: Diagnosis present

## 2022-06-27 DIAGNOSIS — R0603 Acute respiratory distress: Secondary | ICD-10-CM | POA: Diagnosis present

## 2022-06-27 DIAGNOSIS — I509 Heart failure, unspecified: Secondary | ICD-10-CM | POA: Diagnosis not present

## 2022-06-27 DIAGNOSIS — Z90710 Acquired absence of both cervix and uterus: Secondary | ICD-10-CM

## 2022-06-27 DIAGNOSIS — I428 Other cardiomyopathies: Secondary | ICD-10-CM | POA: Diagnosis present

## 2022-06-27 DIAGNOSIS — Z888 Allergy status to other drugs, medicaments and biological substances status: Secondary | ICD-10-CM

## 2022-06-27 DIAGNOSIS — K219 Gastro-esophageal reflux disease without esophagitis: Secondary | ICD-10-CM | POA: Diagnosis not present

## 2022-06-27 DIAGNOSIS — J101 Influenza due to other identified influenza virus with other respiratory manifestations: Principal | ICD-10-CM | POA: Diagnosis present

## 2022-06-27 DIAGNOSIS — I5020 Unspecified systolic (congestive) heart failure: Secondary | ICD-10-CM | POA: Diagnosis present

## 2022-06-27 DIAGNOSIS — E039 Hypothyroidism, unspecified: Secondary | ICD-10-CM | POA: Diagnosis not present

## 2022-06-27 DIAGNOSIS — M109 Gout, unspecified: Secondary | ICD-10-CM | POA: Diagnosis present

## 2022-06-27 DIAGNOSIS — Z6835 Body mass index (BMI) 35.0-35.9, adult: Secondary | ICD-10-CM

## 2022-06-27 DIAGNOSIS — E1169 Type 2 diabetes mellitus with other specified complication: Secondary | ICD-10-CM | POA: Diagnosis present

## 2022-06-27 DIAGNOSIS — Z800 Family history of malignant neoplasm of digestive organs: Secondary | ICD-10-CM | POA: Diagnosis not present

## 2022-06-27 DIAGNOSIS — J449 Chronic obstructive pulmonary disease, unspecified: Secondary | ICD-10-CM | POA: Diagnosis present

## 2022-06-27 DIAGNOSIS — R0602 Shortness of breath: Secondary | ICD-10-CM | POA: Diagnosis not present

## 2022-06-27 DIAGNOSIS — Z801 Family history of malignant neoplasm of trachea, bronchus and lung: Secondary | ICD-10-CM | POA: Diagnosis not present

## 2022-06-27 DIAGNOSIS — Z8349 Family history of other endocrine, nutritional and metabolic diseases: Secondary | ICD-10-CM

## 2022-06-27 DIAGNOSIS — E669 Obesity, unspecified: Secondary | ICD-10-CM | POA: Diagnosis present

## 2022-06-27 DIAGNOSIS — Z9884 Bariatric surgery status: Secondary | ICD-10-CM

## 2022-06-27 DIAGNOSIS — E1159 Type 2 diabetes mellitus with other circulatory complications: Secondary | ICD-10-CM | POA: Diagnosis present

## 2022-06-27 DIAGNOSIS — Z803 Family history of malignant neoplasm of breast: Secondary | ICD-10-CM

## 2022-06-27 LAB — CBC WITH DIFFERENTIAL/PLATELET
Abs Immature Granulocytes: 0.05 10*3/uL (ref 0.00–0.07)
Basophils Absolute: 0 10*3/uL (ref 0.0–0.1)
Basophils Relative: 0 %
Eosinophils Absolute: 0.1 10*3/uL (ref 0.0–0.5)
Eosinophils Relative: 1 %
HCT: 34.9 % — ABNORMAL LOW (ref 36.0–46.0)
Hemoglobin: 11.7 g/dL — ABNORMAL LOW (ref 12.0–15.0)
Immature Granulocytes: 1 %
Lymphocytes Relative: 10 %
Lymphs Abs: 1 10*3/uL (ref 0.7–4.0)
MCH: 32.7 pg (ref 26.0–34.0)
MCHC: 33.5 g/dL (ref 30.0–36.0)
MCV: 97.5 fL (ref 80.0–100.0)
Monocytes Absolute: 0.7 10*3/uL (ref 0.1–1.0)
Monocytes Relative: 7 %
Neutro Abs: 8.3 10*3/uL — ABNORMAL HIGH (ref 1.7–7.7)
Neutrophils Relative %: 81 %
Platelets: 163 10*3/uL (ref 150–400)
RBC: 3.58 MIL/uL — ABNORMAL LOW (ref 3.87–5.11)
RDW: 14.4 % (ref 11.5–15.5)
WBC: 10.2 10*3/uL (ref 4.0–10.5)
nRBC: 0 % (ref 0.0–0.2)

## 2022-06-27 LAB — RESP PANEL BY RT-PCR (RSV, FLU A&B, COVID)  RVPGX2
Influenza A by PCR: POSITIVE — AB
Influenza B by PCR: NEGATIVE
Resp Syncytial Virus by PCR: NEGATIVE
SARS Coronavirus 2 by RT PCR: NEGATIVE

## 2022-06-27 LAB — TROPONIN I (HIGH SENSITIVITY)
Troponin I (High Sensitivity): 20 ng/L — ABNORMAL HIGH (ref <18)
Troponin I (High Sensitivity): 18 ng/L — ABNORMAL HIGH (ref <18)

## 2022-06-27 LAB — COMPREHENSIVE METABOLIC PANEL
ALT: 21 U/L (ref 0–44)
AST: 27 U/L (ref 15–41)
Albumin: 3.9 g/dL (ref 3.5–5.0)
Alkaline Phosphatase: 66 U/L (ref 38–126)
Anion gap: 11 (ref 5–15)
BUN: 23 mg/dL (ref 8–23)
CO2: 23 mmol/L (ref 22–32)
Calcium: 8.8 mg/dL — ABNORMAL LOW (ref 8.9–10.3)
Chloride: 101 mmol/L (ref 98–111)
Creatinine, Ser: 0.92 mg/dL (ref 0.44–1.00)
GFR, Estimated: >60 mL/min (ref >60)
Glucose, Bld: 128 mg/dL — ABNORMAL HIGH (ref 70–99)
Potassium: 4 mmol/L (ref 3.5–5.1)
Sodium: 135 mmol/L (ref 135–145)
Total Bilirubin: 0.5 mg/dL (ref 0.3–1.2)
Total Protein: 7.2 g/dL (ref 6.5–8.1)

## 2022-06-27 LAB — D-DIMER, QUANTITATIVE: D-Dimer, Quant: 0.45 ug/mL-FEU (ref 0.00–0.50)

## 2022-06-27 LAB — BRAIN NATRIURETIC PEPTIDE: B Natriuretic Peptide: 217.8 pg/mL — ABNORMAL HIGH (ref 0.0–100.0)

## 2022-06-27 LAB — TSH: TSH: 1.368 u[IU]/mL (ref 0.350–4.500)

## 2022-06-27 LAB — PROCALCITONIN: Procalcitonin: <0.1 ng/mL

## 2022-06-27 MED ORDER — UMECLIDINIUM BROMIDE 62.5 MCG/ACT IN AEPB
1.0000 | INHALATION_SPRAY | Freq: Every day | RESPIRATORY_TRACT | Status: DC
  Administered 2022-06-29 – 2022-06-30 (×2): 1 via RESPIRATORY_TRACT
  Filled 2022-06-27 (×2): qty 7

## 2022-06-27 MED ORDER — SODIUM CHLORIDE 0.9 % IV SOLN
2.0000 g | INTRAVENOUS | Status: DC
  Administered 2022-06-27 – 2022-06-28 (×2): 2 g via INTRAVENOUS
  Filled 2022-06-27 (×2): qty 20

## 2022-06-27 MED ORDER — LORAZEPAM 0.5 MG PO TABS: 0.5000 mg | ORAL_TABLET | Freq: Two times a day (BID) | ORAL | Status: DC | PRN

## 2022-06-27 MED ORDER — FLUTICASONE FUROATE-VILANTEROL 100-25 MCG/ACT IN AEPB
1.0000 | INHALATION_SPRAY | Freq: Every day | RESPIRATORY_TRACT | Status: DC
  Administered 2022-06-29 – 2022-06-30 (×2): 1 via RESPIRATORY_TRACT
  Filled 2022-06-27 (×2): qty 28

## 2022-06-27 MED ORDER — ALBUTEROL SULFATE (2.5 MG/3ML) 0.083% IN NEBU
2.5000 mg | INHALATION_SOLUTION | Freq: Four times a day (QID) | RESPIRATORY_TRACT | Status: DC
  Administered 2022-06-27 – 2022-06-28 (×2): 2.5 mg via RESPIRATORY_TRACT
  Filled 2022-06-27 (×2): qty 3

## 2022-06-27 MED ORDER — SODIUM CHLORIDE 0.9 % IV SOLN
500.0000 mg | Freq: Once | INTRAVENOUS | Status: AC
  Administered 2022-06-27: 500 mg via INTRAVENOUS
  Filled 2022-06-27: qty 5

## 2022-06-27 MED ORDER — ATORVASTATIN CALCIUM 10 MG PO TABS
20.0000 mg | ORAL_TABLET | Freq: Every day | ORAL | Status: DC
  Administered 2022-06-28 – 2022-06-29 (×2): 20 mg via ORAL
  Filled 2022-06-27 (×2): qty 2

## 2022-06-27 MED ORDER — HYDROCHLOROTHIAZIDE 25 MG PO TABS
25.0000 mg | ORAL_TABLET | Freq: Every day | ORAL | Status: DC
  Administered 2022-06-28 – 2022-06-30 (×3): 25 mg via ORAL
  Filled 2022-06-27 (×3): qty 1

## 2022-06-27 MED ORDER — IPRATROPIUM-ALBUTEROL 0.5-2.5 (3) MG/3ML IN SOLN
3.0000 mL | Freq: Once | RESPIRATORY_TRACT | Status: AC
  Administered 2022-06-27: 3 mL via RESPIRATORY_TRACT
  Filled 2022-06-27: qty 3

## 2022-06-27 MED ORDER — ASPIRIN 81 MG PO TBEC
81.0000 mg | DELAYED_RELEASE_TABLET | Freq: Every day | ORAL | Status: DC
  Administered 2022-06-28 – 2022-06-30 (×3): 81 mg via ORAL
  Filled 2022-06-27 (×3): qty 1

## 2022-06-27 MED ORDER — GUAIFENESIN ER 600 MG PO TB12
600.0000 mg | ORAL_TABLET | Freq: Two times a day (BID) | ORAL | Status: DC
  Administered 2022-06-27 – 2022-06-30 (×6): 600 mg via ORAL
  Filled 2022-06-27 (×6): qty 1

## 2022-06-27 MED ORDER — OSELTAMIVIR PHOSPHATE 75 MG PO CAPS
75.0000 mg | ORAL_CAPSULE | Freq: Once | ORAL | Status: AC
  Administered 2022-06-27: 75 mg via ORAL
  Filled 2022-06-27: qty 1

## 2022-06-27 MED ORDER — ACETAMINOPHEN 650 MG RE SUPP: 650.0000 mg | Freq: Four times a day (QID) | RECTAL | Status: DC | PRN

## 2022-06-27 MED ORDER — SODIUM CHLORIDE 0.9 % IV SOLN: INTRAVENOUS | Status: DC | PRN

## 2022-06-27 MED ORDER — LISINOPRIL-HYDROCHLOROTHIAZIDE 20-25 MG PO TABS: 1.0000 | ORAL_TABLET | Freq: Every day | ORAL | Status: DC

## 2022-06-27 MED ORDER — IPRATROPIUM-ALBUTEROL 0.5-2.5 (3) MG/3ML IN SOLN: 3.0000 mL | RESPIRATORY_TRACT | Status: DC | PRN

## 2022-06-27 MED ORDER — SODIUM CHLORIDE 0.9% FLUSH
3.0000 mL | Freq: Two times a day (BID) | INTRAVENOUS | Status: DC
  Administered 2022-06-27 – 2022-06-30 (×6): 3 mL via INTRAVENOUS

## 2022-06-27 MED ORDER — ACETAMINOPHEN 325 MG PO TABS
650.0000 mg | ORAL_TABLET | Freq: Four times a day (QID) | ORAL | Status: DC | PRN
  Administered 2022-06-29 (×2): 650 mg via ORAL
  Filled 2022-06-27 (×2): qty 2

## 2022-06-27 MED ORDER — FUROSEMIDE 10 MG/ML IJ SOLN
40.0000 mg | Freq: Once | INTRAMUSCULAR | Status: AC
  Administered 2022-06-27: 40 mg via INTRAVENOUS
  Filled 2022-06-27: qty 4

## 2022-06-27 MED ORDER — ASPIRIN 81 MG PO CHEW
324.0000 mg | CHEWABLE_TABLET | Freq: Once | ORAL | Status: AC
  Administered 2022-06-27: 324 mg via ORAL
  Filled 2022-06-27: qty 4

## 2022-06-27 MED ORDER — OSELTAMIVIR PHOSPHATE 75 MG PO CAPS
75.0000 mg | ORAL_CAPSULE | Freq: Two times a day (BID) | ORAL | Status: DC
  Administered 2022-06-27: 75 mg via ORAL
  Filled 2022-06-27 (×2): qty 1

## 2022-06-27 MED ORDER — ENOXAPARIN SODIUM 40 MG/0.4ML IJ SOSY
40.0000 mg | PREFILLED_SYRINGE | INTRAMUSCULAR | Status: DC
  Administered 2022-06-27 – 2022-06-29 (×3): 40 mg via SUBCUTANEOUS
  Filled 2022-06-27 (×3): qty 0.4

## 2022-06-27 MED ORDER — LISINOPRIL 20 MG PO TABS
20.0000 mg | ORAL_TABLET | Freq: Every day | ORAL | Status: DC
  Administered 2022-06-28 – 2022-06-30 (×3): 20 mg via ORAL
  Filled 2022-06-27 (×3): qty 1

## 2022-06-27 MED ORDER — SODIUM CHLORIDE 0.9 % IV SOLN: 1.0000 g | Freq: Once | INTRAVENOUS | Status: DC

## 2022-06-27 MED ORDER — METHYLPREDNISOLONE SODIUM SUCC 125 MG IJ SOLR
125.0000 mg | Freq: Once | INTRAMUSCULAR | Status: AC
  Administered 2022-06-27: 125 mg via INTRAVENOUS
  Filled 2022-06-27: qty 2

## 2022-06-27 MED ORDER — ALBUTEROL SULFATE (2.5 MG/3ML) 0.083% IN NEBU: 2.5000 mg | INHALATION_SOLUTION | RESPIRATORY_TRACT | Status: DC | PRN

## 2022-06-27 MED ORDER — LEVOTHYROXINE SODIUM 50 MCG PO TABS
50.0000 ug | ORAL_TABLET | Freq: Every day | ORAL | Status: DC
  Administered 2022-06-28 – 2022-06-30 (×3): 50 ug via ORAL
  Filled 2022-06-27 (×3): qty 1

## 2022-06-27 MED ORDER — CARVEDILOL 6.25 MG PO TABS
6.2500 mg | ORAL_TABLET | Freq: Two times a day (BID) | ORAL | Status: DC
  Administered 2022-06-28 – 2022-06-30 (×5): 6.25 mg via ORAL
  Filled 2022-06-27 (×5): qty 1

## 2022-06-27 MED ORDER — PREDNISONE 20 MG PO TABS
40.0000 mg | ORAL_TABLET | Freq: Every day | ORAL | Status: DC
  Administered 2022-06-28 – 2022-06-30 (×3): 40 mg via ORAL
  Filled 2022-06-27 (×3): qty 2

## 2022-06-27 MED ORDER — LORAZEPAM 0.5 MG PO TABS
0.5000 mg | ORAL_TABLET | Freq: Every evening | ORAL | Status: DC | PRN
  Administered 2022-06-27 – 2022-06-29 (×3): 0.5 mg via ORAL
  Filled 2022-06-27 (×3): qty 1

## 2022-06-27 NOTE — ED Notes (Signed)
[facility] at bedside to assume care of patient.

## 2022-06-27 NOTE — ED Triage Notes (Signed)
Pt accompanied by family. Reports SOB that started suddenly last night while walking to [place]. Daughter reports they have been cleaning attic out all week. Denies cough or CP

## 2022-06-27 NOTE — Plan of Care (Addendum)
Transfer from [facility] 77 y/o Female with pmh HTN, HLD, CAD, CHF last EF 45% with grade 1 diastolic dysfunction, emphysema, and hypothyroidism who presented with acute onset of shortness of breath.  Recent reports cough, orthopnea, leg swelling, subjective fever/chills.  Also just recently got back from a cruise.  O2 saturations were maintained on room air, but placed on 2 L nasal cannula oxygen for comfort.  Labs significant for WBC 10.2, D-dimer 0.45, BNP 217.8, and troponin 20. Chest x-ray noted mild bibasilar interstitial markings or pronounced on the left concerning for atelectasis versus developing infiltrate.   Patient has been treated with Lasix 40 mg IV, Solu-Medrol 125 mg IV, DuoNeb breathing treatment, aspirin, Rocephin, and azithromycin.  Orders placed for observation to a medical telemetry bed.  Addendum.  Respiratory virus panel came back positive for flu and she was started on Tamiflu.

## 2022-06-27 NOTE — ED Notes (Signed)
ED TO INPATIENT HANDOFF REPORT  ED Nurse Name and Phone #: Avanelle Pixley, MSN, RN, PCCN  S Name/Age/Gender April Combs 77 y.o. female Room/Bed: MH09/MH09  Code Status   Code Status: Prior  Home/SNF/Other Home Patient oriented to: self, place, time, and situation Is this baseline? Yes   Triage Complete: Triage complete  Chief Complaint Dyspnea [R06.00]  Triage Note Pt accompanied by family. Reports SOB that started suddenly last night while walking to [place]. Daughter reports they have been cleaning attic out all week. Denies cough or CP   Allergies Allergies  Allergen Reactions   Semaglutide Diarrhea    Rybelsus - nausea and diarrhea    Level of Care/Admitting Diagnosis ED Disposition     ED Disposition  Admit   Condition  --   Comment  Hospital Area: [facility] [[id number]]  Level of Care: Telemetry Medical [104]  Interfacility transfer: Yes  May place patient in observation at [facility] or [facility] if equivalent level of care is available:: Yes  Covid Evaluation: Asymptomatic - no recent exposure (last 10 days) testing not required  Diagnosis: Dyspnea [241871]  Admitting Physician: Clydie Braun [[id number]]  Attending Physician: Alvira Monday [[id number]]          B Medical/Surgery History Past Medical History:  Diagnosis Date   Anemia    Anxiety    BRBPR (bright red blood per rectum) 02/05/2015   Coronary artery disease (CAD) excluded 01/2014   Low Risk Myoview (false positive) with possible inferior-inferolateral ischemia, but with nonsustained VT --> CATH --> Angiographically Normal Coronary Arteries; coronary CT angiogram showed mild approximately disease but otherwise no obstructive disease.  Coronary calcium score 40.   Depression    FALSE POSITIVE NUCLEAR STRESS CHEST 01/28/2014   Fungal dermatitis 09/29/2011   GERD (gastroesophageal reflux disease)    Gout 04/10/2017   First episode - Left foot 03/2017    Hyperlipidemia    Hyperlipidemia with target LDL less than 100 09/29/2011   Hyperplastic colon polyp    Hypertension    Hypertensive 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 04/26/2017   HYPERTRIGLYCERIDEMIA 07/12/2008   Qualifier: Diagnosis of  By: Beverely Low MD, Katherine     Hypothyroidism 02/09/2017   Irregular heart beat 09/29/2011   Lumbar radiculopathy 12/24/2014   Morbid obesity 10/15/2015   NICM (nonischemic cardiomyopathy)    Essentially resolved. Echocardiogram July 2013 showed normal EF greater than 55%. Important septal motion. Normal LV filling pressures. Mild MR and mild anterior MVP   Obesity (BMI 30-39.9)    Osteoarthritis    Positional vertigo    Prediabetes 08/10/2017   Pulmonary emphysema 04/29/2015   PFTs 2017 - moderate-severe obstructive disease, some restrictive disease   SOB (shortness of breath) 08/23/2011   Past Surgical History:  Procedure Laterality Date   ABDOMINAL HYSTERECTOMY     APPENDECTOMY  as child   BREAST BIOPSY Right    benign   CHOLECYSTECTOMY     COLONOSCOPY     CORONARY CALCIUM SCORE/CT ANGIOGRAM  07/2017   Calcium score 40.  Mild LAD stenosis, but no other significant disease.   DOPPLER ECHOCARDIOGRAPHY  July 2013   09/20/11. normal Lv thickness and function with EF greater thatn 55%   Exercise tolerance test - CPET-MET-TEST  July 2013   Submaximal effort. Only 80% of her, therefore peak VO2 of 75% is likely an underestimate. High resting heart rate, achieving 86% of heart rate. --> Unable to interpret due to lack of effort.  GASTRIC BYPASS  1980   GASTRIC BYPASS     KIDNEY STONE SURGERY     LEFT HEART CATHETERIZATION WITH CORONARY ANGIOGRAM N/A 02/01/2014   Procedure: LEFT HEART CATHETERIZATION WITH CORONARY ANGIOGRAM;  Surgeon: Marykay Lex, MD;  Location: [facility] CATH LAB;  Service: Cardiovascular: Angiographically normal coronaries   NM MYOVIEW LTD  November 2015   4:20 min, 4.6 METS --> DOE, but no chest discomfort;  Significant + ST -T wave changes noted with NSVT & PVCs. Images suggest Inferior-inferolateral ischemia.  Low Risk. -- FALSE POSITIVE   Pulmonary Function Tests  July 2013   Increased densities, decreased FVC - consistent with obesity hypoventilation; FEV1 is 58%, FVC 60% of predicted.   TRANSTHORACIC ECHOCARDIOGRAM  04/2017   Mildly reduced EF of 45%.  No regional wall motion normality.  GR 1 DD   UPPER GASTROINTESTINAL ENDOSCOPY       A IV Location/Drains/Wounds Patient Lines/Drains/Airways Status     Active Line/Drains/Airways     Name Placement date Placement time Site Days   Peripheral IV 06/27/22 20 G Left;Upper Arm 06/27/22  0936  Arm  less than 1   Peripheral IV 06/27/22 22 G Anterior;Distal;Left Forearm 06/27/22  1215  Forearm  less than 1   External Urinary Catheter 06/27/22  1124  --  less than 1            Intake/Output Last 24 hours No intake or output data in the 24 hours ending 06/27/22 1322  Labs/Imaging Results for orders placed or performed during the hospital encounter of 06/27/22 (from the past 48 hour(s))  CBC with Differential     Status: Abnormal   Collection Time: 06/27/22  9:30 AM  Result Value Ref Range   WBC 10.2 4.0 - 10.5 K/uL   RBC 3.58 (L) 3.87 - 5.11 MIL/uL   Hemoglobin 11.7 (L) 12.0 - 15.0 g/dL   HCT 16.1 (L) 09.6 - 04.5 %   MCV 97.5 80.0 - 100.0 fL   MCH 32.7 26.0 - 34.0 pg   MCHC 33.5 30.0 - 36.0 g/dL   RDW 40.9 81.1 - 91.4 %   Platelets 163 150 - 400 K/uL   nRBC 0.0 0.0 - 0.2 %   Neutrophils Relative % 81 %   Neutro Abs 8.3 (H) 1.7 - 7.7 K/uL   Lymphocytes Relative 10 %   Lymphs Abs 1.0 0.7 - 4.0 K/uL   Monocytes Relative 7 %   Monocytes Absolute 0.7 0.1 - 1.0 K/uL   Eosinophils Relative 1 %   Eosinophils Absolute 0.1 0.0 - 0.5 K/uL   Basophils Relative 0 %   Basophils Absolute 0.0 0.0 - 0.1 K/uL   Immature Granulocytes 1 %   Abs Immature Granulocytes 0.05 0.00 - 0.07 K/uL    Comment: Performed at [facility], [address] [place], [place] [address]  Comprehensive metabolic panel     Status: Abnormal   Collection Time: 06/27/22  9:30 AM  Result Value Ref Range   Sodium 135 135 - 145 mmol/L   Potassium 4.0 3.5 - 5.1 mmol/L   Chloride 101 98 - 111 mmol/L   CO2 23 22 - 32 mmol/L   Glucose, Bld 128 (H) 70 - 99 mg/dL    Comment: Glucose reference range applies only to samples taken after fasting for at least 8 hours.   BUN 23 8 - 23 mg/dL   Creatinine, Ser 6.21 0.44 - 1.00 mg/dL   Calcium 8.8 (L) 8.9 -  10.3 mg/dL   Total Protein 7.2 6.5 - 8.1 g/dL   Albumin 3.9 3.5 - 5.0 g/dL   AST 27 15 - 41 U/L   ALT 21 0 - 44 U/L   Alkaline Phosphatase 66 38 - 126 U/L   Total Bilirubin 0.5 0.3 - 1.2 mg/dL   GFR, Estimated >16 >10 mL/min    Comment: (NOTE) Calculated using the CKD-EPI Creatinine Equation (2021)    Anion gap 11 5 - 15    Comment: Performed at [facility], [address] [place], [place] [address]  Troponin I (High Sensitivity)     Status: Abnormal   Collection Time: 06/27/22  9:30 AM  Result Value Ref Range   Troponin I (High Sensitivity) 20 (H) <18 ng/L    Comment: (NOTE) Elevated high sensitivity troponin I (hsTnI) values and significant  changes across serial measurements may suggest ACS but many other  chronic and acute conditions are known to elevate hsTnI results.  Refer to the "Links" section for chest pain algorithms and additional  guidance. Performed at [facility], [address] [place], [place] [address]   Brain natriuretic peptide     Status: Abnormal   Collection Time: 06/27/22  9:30 AM  Result Value Ref Range   B Natriuretic Peptide 217.8 (H) 0.0 - 100.0 pg/mL    Comment: Performed at [facility], [address] [place], [place] [address]  Resp panel by RT-PCR (RSV, Flu A&B, Covid) Anterior Nasal Swab     Status: Abnormal   Collection Time: 06/27/22  9:30 AM   Specimen: Anterior Nasal Swab  Result Value Ref Range   SARS  Coronavirus 2 by RT PCR NEGATIVE NEGATIVE    Comment: (NOTE) SARS-CoV-2 target nucleic acids are NOT DETECTED.  The SARS-CoV-2 RNA is generally detectable in upper respiratory specimens during the acute phase of infection. The lowest concentration of SARS-CoV-2 viral copies this assay can detect is 138 copies/mL. A negative result does not preclude SARS-Cov-2 infection and should not be used as the sole basis for treatment or other patient management decisions. A negative result may occur with  improper specimen collection/handling, submission of specimen other than nasopharyngeal swab, presence of viral mutation(s) within the areas targeted by this assay, and inadequate number of viral copies(<138 copies/mL). A negative result must be combined with clinical observations, patient history, and epidemiological information. The expected result is Negative.  Fact Sheet for Patients:  BloggerCourse.com  Fact Sheet for Healthcare Providers:  SeriousBroker.it  This test is no t yet approved or cleared by the [place] FDA and  has been authorized for detection and/or diagnosis of SARS-CoV-2 by FDA under an Emergency Use Authorization (EUA). This EUA will remain  in effect (meaning this test can be used) for the duration of the COVID-19 declaration under Section 564(b)(1) of the Act, 21 U.S.C.section 360bbb-3(b)(1), unless the authorization is terminated  or revoked sooner.       Influenza A by PCR POSITIVE (A) NEGATIVE   Influenza B by PCR NEGATIVE NEGATIVE    Comment: (NOTE) The Xpert Xpress SARS-CoV-2/FLU/RSV plus assay is intended as an aid in the diagnosis of influenza from Nasopharyngeal swab specimens and should not be used as a sole basis for treatment. Nasal washings and aspirates are unacceptable for Xpert Xpress SARS-CoV-2/FLU/RSV testing.  Fact Sheet for Patients: BloggerCourse.com  Fact  Sheet for Healthcare Providers: SeriousBroker.it  This test is not yet approved or cleared by the [place]  [place] FDA and has been authorized for detection and/or diagnosis of SARS-CoV-2 by FDA under an Emergency Use Authorization (EUA). This EUA will remain in effect (meaning this test can be used) for the duration of the COVID-19 declaration under Section 564(b)(1) of the Act, 21 U.S.C. section 360bbb-3(b)(1), unless the authorization is terminated or revoked.     Resp Syncytial Virus by PCR NEGATIVE NEGATIVE    Comment: (NOTE) Fact Sheet for Patients: BloggerCourse.com  Fact Sheet for Healthcare Providers: SeriousBroker.it  This test is not yet approved or cleared by the [place] FDA and has been authorized for detection and/or diagnosis of SARS-CoV-2 by FDA under an Emergency Use Authorization (EUA). This EUA will remain in effect (meaning this test can be used) for the duration of the COVID-19 declaration under Section 564(b)(1) of the Act, 21 U.S.C. section 360bbb-3(b)(1), unless the authorization is terminated or revoked.  Performed at [facility], [address] [place], [place] [address]   D-dimer, quantitative     Status: None   Collection Time: 06/27/22  9:30 AM  Result Value Ref Range   D-Dimer, Quant 0.45 0.00 - 0.50 ug/mL-FEU    Comment: (NOTE) At the manufacturer cut-off value of 0.5 g/mL FEU, this assay has a negative predictive value of 95-100%.This assay is intended for use in conjunction with a clinical pretest probability (PTP) assessment model to exclude pulmonary embolism (PE) and deep venous thrombosis (DVT) in outpatients suspected of PE or DVT. Results should be correlated with clinical presentation. Performed at [facility], [address] [place], [place] [address]   Troponin I (High Sensitivity)     Status: Abnormal   Collection Time:  06/27/22 11:46 AM  Result Value Ref Range   Troponin I (High Sensitivity) 18 (H) <18 ng/L    Comment: (NOTE) Elevated high sensitivity troponin I (hsTnI) values and significant  changes across serial measurements may suggest ACS but many other  chronic and acute conditions are known to elevate hsTnI results.  Refer to the "Links" section for chest pain algorithms and additional  guidance. Performed at [facility], [address] [place], [place] [address]    DG Chest [place] 1 View  Result Date: 06/27/2022 CLINICAL DATA:  Shortness of breath EXAM: PORTABLE CHEST 1 VIEW COMPARISON:  03/14/2021, 05/14/2022 FINDINGS: The heart size and mediastinal contours are within normal limits. Aortic atherosclerosis. Mildly prominent bibasilar interstitial markings more pronounced on the left. No pleural effusion or pneumothorax. The visualized skeletal structures are unremarkable. IMPRESSION: Mildly prominent bibasilar interstitial markings more pronounced on the left, which may reflect atelectasis versus developing infiltrate. Electronically Signed   By: Duanne Guess D.O.   On: 06/27/2022 09:59    Pending Labs Unresulted Labs (From admission, onward)    None       Vitals/Pain Today's Vitals   06/27/22 1100 06/27/22 1145 06/27/22 1200 06/27/22 1300  BP: (!) 140/63  139/89 116/63  Pulse: 98  97 94  Resp: (!) 21  (!) 26 (!) 26  Temp:      SpO2: 97%  93% 95%  Weight:      PainSc:  7       Isolation Precautions No active isolations  Medications Medications  cefTRIAXone (ROCEPHIN) 2 g in sodium chloride 0.9 % 100 mL IVPB (2 g Intravenous New Bag/Given 06/27/22 1157)  0.9 %  sodium chloride infusion ( Intravenous New Bag/Given 06/27/22 1216)  ipratropium-albuterol (DUONEB) 0.5-2.5 (3) MG/3ML nebulizer solution 3  mL (3 mLs Nebulization Given 06/27/22 1013)  methylPREDNISolone sodium succinate (SOLU-MEDROL) 125 mg/2 mL injection 125 mg (125 mg Intravenous Given 06/27/22 1033)   furosemide (LASIX) injection 40 mg (40 mg Intravenous Given 06/27/22 1155)  azithromycin (ZITHROMAX) 500 mg in sodium chloride 0.9 % 250 mL IVPB (500 mg Intravenous New Bag/Given 06/27/22 1217)  aspirin chewable tablet 324 mg (324 mg Oral Given 06/27/22 1148)  oseltamivir (TAMIFLU) capsule 75 mg (75 mg Oral Given 06/27/22 1148)    Mobility Bedrest, dyspnea upon exertion      Focused Assessments     R Recommendations: See Admitting Provider Note  Report given to:   Additional Notes:

## 2022-06-27 NOTE — ED Provider Notes (Signed)
[place] [facility] Provider Note   CSN: [id number] Arrival date & time: 06/27/22  0920     History  Chief Complaint  Patient presents with   Shortness of Breath    April Combs is a 77 y.o. female.  HPI     77 year old female with a history of hypertension, hypertensive 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, COPD, hyperlipidemia who presents with concern for shortness of breath.  Reports her shortness of breath began suddenly as she was walking to the bathroom last night.  Yesterday, she felt like she was in her normal state of health.  Reports hard to tell if her shortness of breath is related to her COPD or extra fluid.  She notes dyspnea on exertion.  Does note orthopnea last night, feels like increased leg swelling over last few days. Had cough this morning nonproductive and chills beginning last night--thinks she may have had a fever but didn't check.  Has some chest pressure but not significant pain.   Past Medical History:  Diagnosis Date   Anemia    Anxiety    BRBPR (bright red blood per rectum) 02/05/2015   Coronary artery disease (CAD) excluded 01/2014   Low Risk Myoview (false positive) with possible inferior-inferolateral ischemia, but with nonsustained VT --> CATH --> Angiographically Normal Coronary Arteries; coronary CT angiogram showed mild approximately disease but otherwise no obstructive disease.  Coronary calcium score 40.   Depression    FALSE POSITIVE NUCLEAR STRESS CHEST 01/28/2014   Fungal dermatitis 09/29/2011   GERD (gastroesophageal reflux disease)    Gout 04/10/2017   First episode - Left foot 03/2017   Hyperlipidemia    Hyperlipidemia with target LDL less than 100 09/29/2011   Hyperplastic colon polyp    Hypertension    Hypertensive 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 04/26/2017   HYPERTRIGLYCERIDEMIA 07/12/2008   Qualifier: Diagnosis of  By: Beverely Low MD,  Katherine     Hypothyroidism 02/09/2017   Irregular heart beat 09/29/2011   Lumbar radiculopathy 12/24/2014   Morbid obesity 10/15/2015   NICM (nonischemic cardiomyopathy)    Essentially resolved. Echocardiogram July 2013 showed normal EF greater than 55%. Important septal motion. Normal LV filling pressures. Mild MR and mild anterior MVP   Obesity (BMI 30-39.9)    Osteoarthritis    Positional vertigo    Prediabetes 08/10/2017   Pulmonary emphysema 04/29/2015   PFTs 2017 - moderate-severe obstructive disease, some restrictive disease   SOB (shortness of breath) 08/23/2011     Home Medications Prior to Admission medications   Medication Sig Start Date End Date Taking? Authorizing Provider  acetaminophen (TYLENOL) 325 MG tablet Take 2 tablets (650 mg total) by mouth every 6 (six) hours as needed for mild pain (or Fever >/= 101). 11/01/20   Zannie Cove, MD  albuterol (PROVENTIL) (2.5 MG/3ML) 0.083% nebulizer solution USE 1 VIAL IN NEBULIZER EVERY 6 HOURS AS NEEDED FOR WHEEZING FOR SHORTNESS OF BREATH 06/14/22   Pincus Sanes, MD  albuterol (VENTOLIN HFA) 108 (90 Base) MCG/ACT inhaler Inhale 2 puffs into the lungs every 6 (six) hours as needed for wheezing or shortness of breath.    [provider]  allopurinol (ZYLOPRIM) 300 MG tablet TAKE 1 TABLET BY MOUTH  DAILY 12/04/21   Pincus Sanes, MD  atorvastatin (LIPITOR) 20 MG tablet TAKE 1 TABLET BY MOUTH ONCE  DAILY 12/24/21   Pincus Sanes, MD  carvedilol (COREG) 12.5 MG tablet TAKE 1  AND 1/2 TABLETS BY MOUTH  TWICE DAILY WITH A MEAL 05/24/22   Marykay Lex, MD  Fluticasone-Umeclidin-Vilant (TRELEGY ELLIPTA) 100-62.5-25 MCG/ACT AEPB Inhale 1 puff into the lungs daily. Plans to start bevespi after completing rx for trelegy    [provider]  furosemide (LASIX) 20 MG tablet Take 1 tablet (20 mg total) by mouth daily as needed. Patient taking differently: Take 20 mg by mouth daily as needed for fluid. 09/29/20   Marykay Lex,  MD  gabapentin (NEURONTIN) 100 MG capsule TAKE 1 CAPSULE BY MOUTH IN  THE MORNING AND 2 CAPSULES  BY MOUTH AT BEDTIME 07/21/20   Pincus Sanes, MD  levothyroxine (SYNTHROID) 50 MCG tablet Take 1 tablet by mouth once daily 06/15/22   Pincus Sanes, MD  lisinopril-hydrochlorothiazide (ZESTORETIC) 20-25 MG tablet TAKE 1 TABLET BY MOUTH DAILY 02/26/22   Marykay Lex, MD  omeprazole (PRILOSEC) 20 MG capsule TAKE 1 CAPSULE BY MOUTH  TWICE DAILY BEFORE A MEAL 12/04/21   Burns, Bobette Mo, MD  vitamin B-12 (CYANOCOBALAMIN) 1000 MCG tablet Take 1 tablet (1,000 mcg total) by mouth daily. 08/20/21   Pincus Sanes, MD      Allergies    Semaglutide    Review of Systems   Review of Systems  Physical Exam Updated Vital Signs BP 126/81 (BP Location: Right Arm)   Pulse 98   Temp (!) 97.4 F (36.3 C)   Resp (!) 21   Wt 97.7 kg   SpO2 97%   BMI 35.83 kg/m  Physical Exam Vitals and nursing note reviewed.  Constitutional:      General: She is not in acute distress.    Appearance: She is well-developed. She is not diaphoretic.  HENT:     Head: Normocephalic and atraumatic.  Eyes:     Conjunctiva/sclera: Conjunctivae normal.  Neck:     Vascular: JVD present.  Cardiovascular:     Rate and Rhythm: Normal rate and regular rhythm.     Heart sounds: Normal heart sounds. No murmur heard.    No friction rub. No gallop.  Pulmonary:     Effort: Pulmonary effort is normal. No respiratory distress.     Breath sounds: Wheezing and rales (bibasilar) present.  Abdominal:     General: There is no distension.     Palpations: Abdomen is soft.     Tenderness: There is no abdominal tenderness. There is no guarding.  Musculoskeletal:        General: No tenderness.     Cervical back: Normal range of motion.     Right lower leg: Edema (1+ bilat) present.     Left lower leg: Edema present.  Skin:    General: Skin is warm and dry.     Findings: No erythema or rash.  Neurological:     Mental Status: She is  alert and oriented to person, place, and time.     ED Results / Procedures / Treatments   Labs (all labs ordered are listed, but only abnormal results are displayed) Labs Reviewed  CBC WITH DIFFERENTIAL/PLATELET - Abnormal; Notable for the following components:      Result Value   RBC 3.58 (*)    Hemoglobin 11.7 (*)    HCT 34.9 (*)    Neutro Abs 8.3 (*)    All other components within normal limits  COMPREHENSIVE METABOLIC PANEL - Abnormal; Notable for the following components:   Glucose, Bld 128 (*)    Calcium 8.8 (*)  All other components within normal limits  BRAIN NATRIURETIC PEPTIDE - Abnormal; Notable for the following components:   B Natriuretic Peptide 217.8 (*)    All other components within normal limits  TROPONIN I (HIGH SENSITIVITY) - Abnormal; Notable for the following components:   Troponin I (High Sensitivity) 20 (*)    All other components within normal limits  RESP PANEL BY RT-PCR (RSV, FLU A&B, COVID)  RVPGX2  D-DIMER, QUANTITATIVE    EKG EKG Interpretation  Date/Time:  Sunday June 27 2022 09:37:16 EDT Ventricular Rate:  96 PR Interval:  163 QRS Duration: 92 QT Interval:  346 QTC Calculation: 438 R Axis:   64 Text Interpretation: Sinus tachycardia Paired ventricular premature complexes No significant change since last tracing other than PVCs Confirmed by Garland Hincapie ([id number]) on 06/27/2022 9:56:19 AM  Radiology DG Chest Port 1 View  Result Date: 06/27/2022 CLINICAL DATA:  Shortness of breath EXAM: PORTABLE CHEST 1 VIEW COMPARISON:  03/14/2021, 05/14/2022 FINDINGS: The heart size and mediastinal contours are within normal limits. Aortic atherosclerosis. Mildly prominent bibasilar interstitial markings more pronounced on the left. No pleural effusion or pneumothorax. The visualized skeletal structures are unremarkable. IMPRESSION: Mildly prominent bibasilar interstitial markings more pronounced on the left, which may reflect atelectasis versus  developing infiltrate. Electronically Signed   By: Nicholas  Plundo D.O.   On: 06/27/2022 09:59    Procedures Procedures    Medications Ordered in ED Medications  furosemide (LASIX) injection 40 mg (has no administration in time range)  cefTRIAXone (ROCEPHIN) 1 g in sodium chloride 0.9 % 100 mL IVPB (has no administration in time range)  azithromycin (ZITHROMAX) 500 mg in sodium chloride 0.9 % 250 mL IVPB (has no administration in time range)  ipratropium-albuterol (DUONEB) 0.5-2.5 (3) MG/3ML nebulizer solution 3 mL (3 mLs Nebulization Given 06/27/22 1013)  methylPREDNISolone sodium succinate (SOLU-MEDROL) 125 mg/2 mL injection 125 mg (125 mg Intravenous Given 06/27/22 1033)    ED Course/ Medical Decision Making/ A&P                               76  year old female with a history of hypertension, hypertensive 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, COPD, hyperlipidemia who presents with concern for shortness of breath.  Differential diagnosis for dyspnea includes ACS, PE, COPD exacerbation, CHF exacerbation, anemia, pneumonia, viral etiology such as COVID 19 infection, metabolic abnormality.    Chest x-ray was done and personally evaluated by me and radiology which showed mildly prominent interstitial marking more prounced on the left, atelectasis versus developing infiltrate. .   EKG was evaluated by me which showed sinus rhythm with PVCs, frequent PVCs noted on monitor.    Labs personally evaluated by me show no clinically significant anemia, no significant electrolyte abnormalities.    She is low risk Wells with a negative D-dimer and have low suspicion for PE  BNP was 217, increased from prior.  She does have some mild leg swelling, report of orthopnea last night, suspect she does have element of congestive heart failure.  Troponin is mildly elevated at 20, will evaluate a delta troponin given sudden onset of symptoms early this AM.    Suspect combined  CHF, COPD exacerbation (possible triggers attic dust/possible viral exposures on cruise) however difficult to rule out early pneumonia in setting of chills, cough, xr.    Given solumedrol, duonebs, lasix, rocephin/azithromycin, aspirin.   Placed on O2 given work of breathing and saturations 91%,  will admit to hospital for continued care         Final Clinical Impression(s) / ED Diagnoses Final diagnoses:  COPD exacerbation  Acute on chronic diastolic congestive heart failure  Elevated troponin    Rx / DC Orders ED Discharge Orders     None         Alvira Monday, MD 06/27/22 1054

## 2022-06-27 NOTE — H&P (Signed)
History and Physical    Patient: April Combs WUX:[id number] DOB: 07-14-1945 DOA: 06/27/2022 DOS: the patient was seen and examined on 06/27/2022 PCP: Pincus Sanes, MD  Patient coming from: [facility]   Chief Complaint:  Chief Complaint  Patient presents with   Shortness of Breath   HPI: April Combs is a 77 y.o. female with medical history significant of  HTN, HLD, CAD, CHF last EF 45% with grade 1 diastolic dysfunction, emphysema, and hypothyroidism who presents with complaints of shortness of breath which started yesterday evening while walking to the bathroom.  Overnight she was unable to lay down, sit up, or do anything without feeling short of breath.  She reported having associated symptoms of subjective fever, chills, and chest pressure.  To her knowledge she had not had any recent sick contacts.  She had been having some leg swelling over the last couple days and had taken an extra dose of Lasix.  Denies having any wheezing, nausea, vomiting, or diarrhea symptoms.  She has been unable to sleep due to her breathing.  She reports prior history of tobacco abuse but quit back in 2008.  She thinks that she may have smoked 1 pack of cigarettes per day for approximately 20-30 years.  In the emergency department patient 's O2 saturations were maintained on room air, but placed on 2 L nasal cannula oxygen for comfort.  Labs significant for WBC 10.2, D-dimer 0.45, BNP 217.8, and troponin 20. Chest x-ray noted mild bibasilar interstitial markings or pronounced on the left concerning for atelectasis versus developing infiltrate.  Patient has been treated with Lasix 40 mg IV, Solu-Medrol 125 mg IV, DuoNeb breathing treatment, aspirin, Rocephin, and azithromycin.   Influenza screening came back positive and she had been started on Tamiflu. Review of Systems: As mentioned in the history of present illness. All other systems reviewed and are negative. Past Medical History:   Diagnosis Date   Anemia    Anxiety    BRBPR (bright red blood per rectum) 02/05/2015   Coronary artery disease (CAD) excluded 01/2014   Low Risk Myoview (false positive) with possible inferior-inferolateral ischemia, but with nonsustained VT --> CATH --> Angiographically Normal Coronary Arteries; coronary CT angiogram showed mild approximately disease but otherwise no obstructive disease.  Coronary calcium score 40.   Depression    FALSE POSITIVE NUCLEAR STRESS CHEST 01/28/2014   Fungal dermatitis 09/29/2011   GERD (gastroesophageal reflux disease)    Gout 04/10/2017   First episode - Left foot 03/2017   Hyperlipidemia    Hyperlipidemia with target LDL less than 100 09/29/2011   Hyperplastic colon polyp    Hypertension    Hypertensive 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 04/26/2017   HYPERTRIGLYCERIDEMIA 07/12/2008   Qualifier: Diagnosis of  By: Beverely Low MD, Katherine     Hypothyroidism 02/09/2017   Irregular heart beat 09/29/2011   Lumbar radiculopathy 12/24/2014   Morbid obesity 10/15/2015   NICM (nonischemic cardiomyopathy)    Essentially resolved. Echocardiogram July 2013 showed normal EF greater than 55%. Important septal motion. Normal LV filling pressures. Mild MR and mild anterior MVP   Obesity (BMI 30-39.9)    Osteoarthritis    Positional vertigo    Prediabetes 08/10/2017   Pulmonary emphysema 04/29/2015   PFTs 2017 - moderate-severe obstructive disease, some restrictive disease   SOB (shortness of breath) 08/23/2011   Past Surgical History:  Procedure Laterality Date   ABDOMINAL HYSTERECTOMY     APPENDECTOMY  as child  BREAST BIOPSY Right    benign   CHOLECYSTECTOMY     COLONOSCOPY     CORONARY CALCIUM SCORE/CT ANGIOGRAM  07/2017   Calcium score 40.  Mild LAD stenosis, but no other significant disease.   DOPPLER ECHOCARDIOGRAPHY  July 2013   09/20/11. normal Lv thickness and function with EF greater thatn 55%   Exercise tolerance test - CPET-MET-TEST   July 2013   Submaximal effort. Only 80% of her, therefore peak VO2 of 75% is likely an underestimate. High resting heart rate, achieving 86% of heart rate. --> Unable to interpret due to lack of effort.   GASTRIC BYPASS  1980   GASTRIC BYPASS     KIDNEY STONE SURGERY     LEFT HEART CATHETERIZATION WITH CORONARY ANGIOGRAM N/A 02/01/2014   Procedure: LEFT HEART CATHETERIZATION WITH CORONARY ANGIOGRAM;  Surgeon: Marykay Lex, MD;  Location: [facility] CATH LAB;  Service: Cardiovascular: Angiographically normal coronaries   NM MYOVIEW LTD  November 2015   4:20 min, 4.6 METS --> DOE, but no chest discomfort; Significant + ST -T wave changes noted with NSVT & PVCs. Images suggest Inferior-inferolateral ischemia.  Low Risk. -- FALSE POSITIVE   Pulmonary Function Tests  July 2013   Increased densities, decreased FVC - consistent with obesity hypoventilation; FEV1 is 58%, FVC 60% of predicted.   TRANSTHORACIC ECHOCARDIOGRAM  04/2017   Mildly reduced EF of 45%.  No regional wall motion normality.  GR 1 DD   UPPER GASTROINTESTINAL ENDOSCOPY     Social History:  reports that she quit smoking about 19 years ago. Her smoking use included cigarettes. She has a 30.00 pack-year smoking history. She has never used smokeless tobacco. She reports current alcohol use of about 3.0 standard drinks of alcohol per week. She reports that she does not use drugs.  Allergies  Allergen Reactions   Semaglutide Diarrhea    Rybelsus - nausea and diarrhea    Family History  Problem Relation Age of Onset   Kidney disease Daughter    Multiple sclerosis Daughter    Heart disease Father    Heart disease Mother    Thyroid disease Mother    Heart disease Paternal Grandfather    Colon cancer Other        early 76's.  Genetic testing from maternal side of family.   Colon cancer Maternal Aunt    Breast cancer Maternal Aunt    Lung cancer Sister        smoker   Diabetes Sister    Colon cancer Maternal Uncle    Diabetes  Daughter        x3   Heart disease Maternal Aunt    Colon polyps Neg Hx    Esophageal cancer Neg Hx    Gallbladder disease Neg Hx    Rectal cancer Neg Hx    Stomach cancer Neg Hx     Prior to Admission medications   Medication Sig Start Date End Date Taking? Authorizing Provider  acetaminophen (TYLENOL) 325 MG tablet Take 2 tablets (650 mg total) by mouth every 6 (six) hours as needed for mild pain (or Fever >/= 101). 11/01/20   Zannie Cove, MD  albuterol (PROVENTIL) (2.5 MG/3ML) 0.083% nebulizer solution USE 1 VIAL IN NEBULIZER EVERY 6 HOURS AS NEEDED FOR WHEEZING FOR SHORTNESS OF BREATH 06/14/22   Pincus Sanes, MD  albuterol (VENTOLIN HFA) 108 (90 Base) MCG/ACT inhaler Inhale 2 puffs into the lungs every 6 (six) hours as needed for wheezing or shortness of  breath.    [provider]  allopurinol (ZYLOPRIM) 300 MG tablet TAKE 1 TABLET BY MOUTH  DAILY 12/04/21   Pincus Sanes, MD  atorvastatin (LIPITOR) 20 MG tablet TAKE 1 TABLET BY MOUTH ONCE  DAILY 12/24/21   Pincus Sanes, MD  carvedilol (COREG) 12.5 MG tablet TAKE 1 AND 1/2 TABLETS BY MOUTH  TWICE DAILY WITH A MEAL 05/24/22   Marykay Lex, MD  Fluticasone-Umeclidin-Vilant (TRELEGY ELLIPTA) 100-62.5-25 MCG/ACT AEPB Inhale 1 puff into the lungs daily. Plans to start bevespi after completing rx for trelegy    [provider]  furosemide (LASIX) 20 MG tablet Take 1 tablet (20 mg total) by mouth daily as needed. Patient taking differently: Take 20 mg by mouth daily as needed for fluid. 09/29/20   Marykay Lex, MD  gabapentin (NEURONTIN) 100 MG capsule TAKE 1 CAPSULE BY MOUTH IN  THE MORNING AND 2 CAPSULES  BY MOUTH AT BEDTIME 07/21/20   Pincus Sanes, MD  levothyroxine (SYNTHROID) 50 MCG tablet Take 1 tablet by mouth once daily 06/15/22   Pincus Sanes, MD  lisinopril-hydrochlorothiazide (ZESTORETIC) 20-25 MG tablet TAKE 1 TABLET BY MOUTH DAILY 02/26/22   Marykay Lex, MD  omeprazole (PRILOSEC) 20 MG capsule TAKE  1 CAPSULE BY MOUTH  TWICE DAILY BEFORE A MEAL 12/04/21   Burns, Bobette Mo, MD  vitamin B-12 (CYANOCOBALAMIN) 1000 MCG tablet Take 1 tablet (1,000 mcg total) by mouth daily. 08/20/21   Pincus Sanes, MD    Physical Exam: Vitals:   06/27/22 1100 06/27/22 1200 06/27/22 1300 06/27/22 1326  BP: (!) 140/63 139/89 116/63   Pulse: 98 97 94   Resp: (!) 21 (!) 26 (!) 26   Temp:    97.8 F (36.6 C)  TempSrc:    Oral  SpO2: 97% 93% 95%   Weight:       Constitutional: Elderly female who appears to be ill but in no acute distress Eyes: PERRL, lids and conjunctivae normal ENMT: Mucous membranes are moist. Posterior pharynx clear of any exudate or lesions.  Neck: normal, supple.  No JVD appreciated at this time. Respiratory: Patient with some mild wheezes appreciated on physical exam with no significant rhonchi appreciated.  O2 saturation currently maintained on 2 L of nasal cannula oxygen. Cardiovascular: Regular rate and rhythm, no murmurs / rubs / gallops. No extremity edema. 2+ pedal pulses. No carotid bruits.  Abdomen: no tenderness, no masses palpated. No hepatosplenomegaly. Bowel sounds positive.  Musculoskeletal: no clubbing / cyanosis. No joint deformity upper and lower extremities. Good ROM, no contractures. Normal muscle tone.  Skin: no rashes, lesions, ulcers. No induration Neurologic: CN 2-12 grossly intact. Sensation intact, DTR normal. Strength 5/5 in all 4.  Psychiatric: Normal judgment and insight. Alert and oriented x 3. Normal mood.   Data Reviewed:  Sinus rhythm at 96 bpm with paired PVCs.  Reviewed labs, imaging, and pertinent records as noted above in HPI  Assessment and Plan: Acute respiratory distress secondary to influenza A infection COPD exacerbation Acute.  Patient presents with complaints of shortness of breath.  Patient was never noted to be hypoxic and had been placed on 2 L of nasal cannula oxygen for comfort..  Chest x-ray noted mildly prominent bibasilar interstitial  markings more pronounced on the left with concern for atelectasis versus developing infiltrate.  Noted to have some mild wheezes on physical exam.  Influenza A screening was positive.  Patient had initially been given Rocephin, azithromycin, Lasix 40 mg  IV, and steroids as the cause of symptoms is not totally clear. -Admit to a telemetry bed -Nasal cannula oxygen as needed to maintain O2 saturation greater than 92%. -Incentive spirometer and flutter valve -Check procalcitonin.  If procalcitonin elevated continue antibiotics of Rocephin and azithromycin -Continue Tamiflu -Prednisone 40 mg daily -Albuterol nebs scheduled and as needed -Pharmacy substitution for Trelegy -Acetaminophen as needed for fever -Mucinex  Elevated troponin Coronary artery disease Acute.  Patient did report having chest pressure yesterday evening.  High-sensitivity troponins 20-> 18.  Suspect more likely secondary to demand in setting of influenza infection.  Prior coronary CT from 2019 noted coronary artery calcium score of 40 with mild stenosis in the proximal LAD.  She had been given full dose aspirin due to being transferred to [facility]. -Check echocardiogram -Continue aspirin and statin  Heart failure with reduced EF Chronic.  Prior to arrival BNP was noted to be mildly elevated at 217.8.  Is not totally clear if patient was fluid overloaded and and complained of increased lower extremity swelling and was noted to have signs of JVD.  Patient has been given Lasix 40 mg IV prior to transfer.  On physical exam no lower extremity swelling or signs of JVD noted at this time.  Last EF was noted to be around 45% with grade 1 diastolic dysfunction in 2019. -Strict I&O's and daily weights -Follow-up echocardiogram  Essential hypertension Blood pressures currently maintained. -Continue home blood pressure regimen  Normocytic anemia Hemoglobin 11.7 which appears around patient's baseline looking at previous records.  No  reports of bleeding. -Continue to monitor  Hypothyroidism -Check TSH -Continue levothyroxine  Hyperlipidemia -Continue atorvastatin  Obesity BMI 35.83 kg/m  DVT prophylaxis: Lovenox Advance Care Planning:   Code Status: Full Code    Consults: None  Family Communication: Family request to be updated at this time  Severity of Illness: The appropriate patient status for this patient is INPATIENT. Inpatient status is judged to be reasonable and necessary in order to provide the required intensity of service to ensure the patient's safety. The patient's presenting symptoms, physical exam findings, and initial radiographic and laboratory data in the context of their chronic comorbidities is felt to place them at high risk for further clinical deterioration. Furthermore, it is not anticipated that the patient will be medically stable for discharge from the hospital within 2 midnights of admission.   * I certify that at the point of admission it is my clinical judgment that the patient will require inpatient hospital care spanning beyond 2 midnights from the point of admission due to high intensity of service, high risk for further deterioration and high frequency of surveillance required.*  Author: Clydie Braun, MD 06/27/2022 2:45 PM  For on call review [email or web address].

## 2022-06-28 ENCOUNTER — Inpatient Hospital Stay (HOSPITAL_COMMUNITY): Payer: Medicare Other

## 2022-06-28 DIAGNOSIS — I5033 Acute on chronic diastolic (congestive) heart failure: Secondary | ICD-10-CM | POA: Diagnosis not present

## 2022-06-28 DIAGNOSIS — J441 Chronic obstructive pulmonary disease with (acute) exacerbation: Secondary | ICD-10-CM | POA: Diagnosis not present

## 2022-06-28 DIAGNOSIS — R7989 Other specified abnormal findings of blood chemistry: Secondary | ICD-10-CM | POA: Diagnosis not present

## 2022-06-28 DIAGNOSIS — R0603 Acute respiratory distress: Secondary | ICD-10-CM | POA: Diagnosis not present

## 2022-06-28 LAB — CBC
HCT: 33.2 % — ABNORMAL LOW (ref 36.0–46.0)
Hemoglobin: 11 g/dL — ABNORMAL LOW (ref 12.0–15.0)
MCH: 32.3 pg (ref 26.0–34.0)
MCHC: 33.1 g/dL (ref 30.0–36.0)
MCV: 97.4 fL (ref 80.0–100.0)
Platelets: 145 10*3/uL — ABNORMAL LOW (ref 150–400)
RBC: 3.41 MIL/uL — ABNORMAL LOW (ref 3.87–5.11)
RDW: 14.4 % (ref 11.5–15.5)
WBC: 9.6 10*3/uL (ref 4.0–10.5)
nRBC: 0 % (ref 0.0–0.2)

## 2022-06-28 LAB — RESPIRATORY PANEL BY PCR

## 2022-06-28 LAB — BASIC METABOLIC PANEL
Anion gap: 12 (ref 5–15)
BUN: 48 mg/dL — ABNORMAL HIGH (ref 8–23)
CO2: 25 mmol/L (ref 22–32)
Calcium: 8.2 mg/dL — ABNORMAL LOW (ref 8.9–10.3)
Chloride: 99 mmol/L (ref 98–111)
Creatinine, Ser: 1.23 mg/dL — ABNORMAL HIGH (ref 0.44–1.00)
GFR, Estimated: 46 mL/min — ABNORMAL LOW (ref >60)
Glucose, Bld: 187 mg/dL — ABNORMAL HIGH (ref 70–99)
Potassium: 4.4 mmol/L (ref 3.5–5.1)
Sodium: 136 mmol/L (ref 135–145)

## 2022-06-28 MED ORDER — OSELTAMIVIR PHOSPHATE 30 MG PO CAPS
30.0000 mg | ORAL_CAPSULE | Freq: Two times a day (BID) | ORAL | Status: DC
  Administered 2022-06-28 – 2022-06-30 (×5): 30 mg via ORAL
  Filled 2022-06-28 (×5): qty 1

## 2022-06-28 MED ORDER — IPRATROPIUM-ALBUTEROL 0.5-2.5 (3) MG/3ML IN SOLN: 3.0000 mL | Freq: Four times a day (QID) | RESPIRATORY_TRACT | Status: DC

## 2022-06-28 MED ORDER — IPRATROPIUM-ALBUTEROL 0.5-2.5 (3) MG/3ML IN SOLN: 3.0000 mL | Freq: Two times a day (BID) | RESPIRATORY_TRACT | Status: DC

## 2022-06-28 MED ORDER — IPRATROPIUM-ALBUTEROL 0.5-2.5 (3) MG/3ML IN SOLN
3.0000 mL | Freq: Four times a day (QID) | RESPIRATORY_TRACT | Status: DC
  Administered 2022-06-28: 3 mL via RESPIRATORY_TRACT
  Filled 2022-06-28: qty 3

## 2022-06-28 NOTE — Progress Notes (Signed)
Mobility Specialist Progress Note:   06/28/22 1230  Mobility  Activity Ambulated with assistance in hallway  Level of Assistance Standby assist, set-up cues, supervision of patient - no hands on  Assistive Device None  Distance Ambulated (ft) 400 ft  Activity Response Tolerated well  Mobility Referral Yes  $Mobility charge 1 Mobility   Pt agreeable to mobility session. No supplemental O2 required to maintain SpO2 WFL. Pt c/o mild SOB throughout ambulation, encouraged frequent OOB mobility. Pt voices understanding. Left sitting EOB with all needs met.   Addison Lank Mobility Specialist Please contact via SecureChat or  Rehab office at [phone]

## 2022-06-28 NOTE — Care Management (Signed)
  Transition of Care [facility]) Screening Note   Patient Details  Name: Bobie Miltenberger Date of Birth: 02/15/1946   Transition of Care [facility]) CM/SW Contact:    Gala Lewandowsky, RN Phone Number: 06/28/2022, 4:13 PM    Transition of Care Department [facility]) has reviewed the patient and no TOC needs have been identified at this time. Patient presented as a transfer from [facility] for shortness of breath. Patient is from home alone, has insurance, and a PCP. We will continue to monitor patient advancement through interdisciplinary progression rounds. If new patient transition needs arise, please place a TOC consult.

## 2022-06-28 NOTE — Progress Notes (Signed)
Nurse requested Mobility Specialist to perform oxygen saturation test with pt which includes removing pt from oxygen both at rest and while ambulating.  Below are the results from that testing.     Patient Saturations on Room Air at Rest = spO2 90%  Patient Saturations on Room Air while Ambulating = sp02 90%   Patient Saturations on N/A Liters of oxygen while Ambulating = sp02 N/A%  Reported results to nurse.   April Combs Mobility Specialist Please contact via SecureChat or  Rehab office at [phone]

## 2022-06-28 NOTE — Progress Notes (Signed)
PROGRESS NOTE    April Combs  ZOX:[id number] DOB: 06-17-1945 DOA: 06/27/2022 PCP: Pincus Sanes, MD     Brief Narrative:   77 y.o. WF PMHx  HTN, HLD, CAD, CHF last EF 45% with grade 1 diastolic dysfunction, emphysema, and hypothyroidism   Pesents with complaints of shortness of breath which started yesterday evening while walking to the bathroom.  Overnight she was unable to lay down, sit up, or do anything without feeling short of breath.  She reported having associated symptoms of subjective fever, chills, and chest pressure.  To her knowledge she had not had any recent sick contacts.  She had been having some leg swelling over the last couple days and had taken an extra dose of Lasix.  Denies having any wheezing, nausea, vomiting, or diarrhea symptoms.  She has been unable to sleep due to her breathing.  She reports prior history of tobacco abuse but quit back in 2008.  She thinks that she may have smoked 1 pack of cigarettes per day for approximately 20-30 years.   In the emergency department patient 's O2 saturations were maintained on room air, but placed on 2 L nasal cannula oxygen for comfort.  Labs significant for WBC 10.2, D-dimer 0.45, BNP 217.8, and troponin 20. Chest x-ray noted mild bibasilar interstitial markings or pronounced on the left concerning for atelectasis versus developing infiltrate.  Patient has been treated with Lasix 40 mg IV, Solu-Medrol 125 mg IV, DuoNeb breathing treatment, aspirin, Rocephin, and azithromycin.   Influenza screening came back positive and she had been started on Tamiflu.   Subjective: A/O x 4, positive SOB, negative CP,   Assessment & Plan: Covid vaccination;   Principal Problem:   Acute respiratory distress Active Problems:   COPD with acute exacerbation   Influenza A   Elevated troponin   CAD (coronary artery disease)   HFrEF (heart failure with reduced ejection fraction)   Essential hypertension   Normocytic anemia    Hypothyroidism   Hyperlipidemia with target LDL less than 100   Obesity (BMI 30-39.9)   Acute respiratory distress secondary to influenza A infection/COPD exacerbation -Chest x-ray noted mildly prominent bibasilar interstitial markings more pronounced on the left with concern for atelectasis versus developing infiltrate.  -4/14 procalcitonin<0.10 DC antibiotics -Complete course of Tamiflu -Prednisone 40 mg daily - DuoNeb QID -Incentive spirometry - Flutter valve   Elevated troponin/CAD -Trend troponins  Latest Reference Range & Units 06/27/22 09:30 06/27/22 11:46  Troponin I (High Sensitivity) <18 ng/L 20 (H) 18 (H)  (H): Data is abnormally high -Not consistent with ACS  -Echocardiogram pending  -Chronic systolic and diastolic CHF - BMP upon admission= 217.8 -2019 echocardiogram LVEF= 45%, grade 1 diastolic dysfunction - Patient does not appear fluid overloaded -Coreg 6.25 mg BID - Lisinopril 20 mg daily -HCTZ 25 mg daily -Strict in and out - Daily weight   Essential hypertension -See CHF   Normocytic anemia (baselineHgB 11.7) -Anemia panel pending   Hypothyroidism -Check TSH -Synthroid 50 mcg daily   Hyperlipidemia -Lipitor 20 mg daily - Lipid panel pending   Obesity (BMI 35.83 kg/m)         Mobility Assessment (last 72 hours)     Mobility Assessment     Row Name 06/28/22 0815 06/27/22 1400         Does patient have an order for bedrest or is patient medically unstable No - Continue assessment No - Continue assessment      What is the highest  level of mobility based on the progressive mobility assessment? Level 6 (Walks independently in room and hall) - Balance while walking in room without assist - Complete Level 5 (Walks with assist in room/hall) - Balance while stepping forward/back and can walk in room with assist - Complete                     DVT prophylaxis: Lovenox Code Status: Full Family Communication:  Status is:  Inpatient    Dispo: The patient is from: Home              Anticipated d/c is to: Home              Anticipated d/c date is: 2 days              Patient currently is not medically stable to d/c.      Consultants:    Procedures/Significant Events:    I have personally reviewed and interpreted all radiology studies and my findings are as above.  VENTILATOR SETTINGS:    Cultures   Antimicrobials: Anti-infectives (From admission, onward)    Start     Dose/Rate Route Frequency Ordered Stop   06/28/22 1000  oseltamivir (TAMIFLU) capsule 30 mg        30 mg Oral 2 times daily 06/28/22 0751 07/02/22 2159   06/27/22 2200  oseltamivir (TAMIFLU) capsule 75 mg  Status:  Discontinued        75 mg Oral 2 times daily 06/27/22 1509 06/28/22 0751   06/27/22 1130  cefTRIAXone (ROCEPHIN) 2 g in sodium chloride 0.9 % 100 mL IVPB  Status:  Discontinued        2 g 200 mL/hr over 30 Minutes Intravenous Every 24 hours 06/27/22 1117 06/28/22 2032   06/27/22 1130  oseltamivir (TAMIFLU) capsule 75 mg        75 mg Oral  Once 06/27/22 1121 06/27/22 1148   06/27/22 1100  cefTRIAXone (ROCEPHIN) 1 g in sodium chloride 0.9 % 100 mL IVPB  Status:  Discontinued        1 g 200 mL/hr over 30 Minutes Intravenous  Once 06/27/22 1046 06/27/22 1117   06/27/22 1100  azithromycin (ZITHROMAX) 500 mg in sodium chloride 0.9 % 250 mL IVPB        500 mg 250 mL/hr over 60 Minutes Intravenous  Once 06/27/22 1046 06/27/22 1324         Devices    LINES / TUBES:      Continuous Infusions:  sodium chloride Stopped (06/27/22 1333)   cefTRIAXone (ROCEPHIN)  IV Stopped (06/27/22 1232)     Objective: Vitals:   06/27/22 2007 06/28/22 0024 06/28/22 0412 06/28/22 0815  BP: 125/63 107/68 127/63 125/79  Pulse: 92 72 94 100  Resp: 20 (!) 21 20 18   Temp: 97.8 F (36.6 C) 97.7 F (36.5 C) 97.9 F (36.6 C) 98.6 F (37 C)  TempSrc: Oral Axillary Oral Oral  SpO2: 96% 93% 98% 96%  Weight:         Intake/Output Summary (Last 24 hours) at 06/28/2022 0931 Last data filed at 06/28/2022 0800 Gross per 24 hour  Intake 592.7 ml  Output --  Net 592.7 ml   Filed Weights   06/27/22 1032  Weight: 97.7 kg    Examination:  General: A/O x 4, positive  acute respiratory distress Eyes: negative scleral hemorrhage, negative anisocoria, negative icterus ENT: Negative Runny nose, negative gingival bleeding, Neck:  Negative scars, masses, torticollis, lymphadenopathy,  JVD Lungs: decreased breath sounds bilateral, positive expiratory wheezing diffuse, negative crackles  Cardiovascular: Regular rate and rhythm without murmur gallop or rub normal S1 and S2 Abdomen: negative abdominal pain, nondistended, positive soft, bowel sounds, no rebound, no ascites, no appreciable mass Extremities: No significant cyanosis, clubbing, or edema bilateral lower extremities Skin: Negative rashes, lesions, ulcers Psychiatric:  Negative depression, negative anxiety, negative fatigue, negative mania  Central nervous system:  Cranial nerves II through XII intact, tongue/uvula midline, all extremities muscle strength 5/5, sensation intact throughout, negative dysarthria, negative expressive aphasia, negative receptive aphasia.  .     Data Reviewed: Care during the described time interval was provided by me .  I have reviewed this patient's available data, including medical history, events of note, physical examination, and all test results as part of my evaluation.  CBC: Recent Labs  Lab 06/27/22 0930 06/28/22 0332  WBC 10.2 9.6  NEUTROABS 8.3*  --   HGB 11.7* 11.0*  HCT 34.9* 33.2*  MCV 97.5 97.4  PLT 163 145*   Basic Metabolic Panel: Recent Labs  Lab 06/27/22 0930 06/28/22 0332  NA 135 136  K 4.0 4.4  CL 101 99  CO2 23 25  GLUCOSE 128* 187*  BUN 23 48*  CREATININE 0.92 1.23*  CALCIUM 8.8* 8.2*   GFR: Estimated Creatinine Clearance: 45.7 mL/min (A) (by C-G formula based on SCr of 1.23  mg/dL (H)). Liver Function Tests: Recent Labs  Lab 06/27/22 0930  AST 27  ALT 21  ALKPHOS 66  BILITOT 0.5  PROT 7.2  ALBUMIN 3.9   No results for input(s): "LIPASE", "AMYLASE" in the last 168 hours. No results for input(s): "AMMONIA" in the last 168 hours. Coagulation Profile: No results for input(s): "INR", "PROTIME" in the last 168 hours. Cardiac Enzymes: No results for input(s): "CKTOTAL", "CKMB", "CKMBINDEX", "TROPONINI" in the last 168 hours. BNP (last 3 results) No results for input(s): "PROBNP" in the last 8760 hours. HbA1C: No results for input(s): "HGBA1C" in the last 72 hours. CBG: No results for input(s): "GLUCAP" in the last 168 hours. Lipid Profile: No results for input(s): "CHOL", "HDL", "LDLCALC", "TRIG", "CHOLHDL", "LDLDIRECT" in the last 72 hours. Thyroid Function Tests: Recent Labs    06/27/22 1822  TSH 1.368   Anemia Panel: No results for input(s): "VITAMINB12", "FOLATE", "FERRITIN", "TIBC", "IRON", "RETICCTPCT" in the last 72 hours. Sepsis Labs: Recent Labs  Lab 06/27/22 1822  PROCALCITON <0.10    Recent Results (from the past 240 hour(s))  Resp panel by RT-PCR (RSV, Flu A&B, Covid) Anterior Nasal Swab     Status: Abnormal   Collection Time: 06/27/22  9:30 AM   Specimen: Anterior Nasal Swab  Result Value Ref Range Status   SARS Coronavirus 2 by RT PCR NEGATIVE NEGATIVE Final    Comment: (NOTE) SARS-CoV-2 target nucleic acids are NOT DETECTED.  The SARS-CoV-2 RNA is generally detectable in upper respiratory specimens during the acute phase of infection. The lowest concentration of SARS-CoV-2 viral copies this assay can detect is 138 copies/mL. A negative result does not preclude SARS-Cov-2 infection and should not be used as the sole basis for treatment or other patient management decisions. A negative result may occur with  improper specimen collection/handling, submission of specimen other than nasopharyngeal swab, presence of viral  mutation(s) within the areas targeted by this assay, and inadequate number of viral copies(<138 copies/mL). A negative result must be combined with clinical observations, patient history, and epidemiological information. The expected result is Negative.  Fact Sheet  for Patients:  BloggerCourse.com  Fact Sheet for Healthcare Providers:  SeriousBroker.it  This test is no t yet approved or cleared by the [place] FDA and  has been authorized for detection and/or diagnosis of SARS-CoV-2 by FDA under an Emergency Use Authorization (EUA). This EUA will remain  in effect (meaning this test can be used) for the duration of the COVID-19 declaration under Section 564(b)(1) of the Act, 21 U.S.C.section 360bbb-3(b)(1), unless the authorization is terminated  or revoked sooner.       Influenza A by PCR POSITIVE (A) NEGATIVE Final   Influenza B by PCR NEGATIVE NEGATIVE Final    Comment: (NOTE) The Xpert Xpress SARS-CoV-2/FLU/RSV plus assay is intended as an aid in the diagnosis of influenza from Nasopharyngeal swab specimens and should not be used as a sole basis for treatment. Nasal washings and aspirates are unacceptable for Xpert Xpress SARS-CoV-2/FLU/RSV testing.  Fact Sheet for Patients: BloggerCourse.com  Fact Sheet for Healthcare Providers: SeriousBroker.it  This test is not yet approved or cleared by the [place] FDA and has been authorized for detection and/or diagnosis of SARS-CoV-2 by FDA under an Emergency Use Authorization (EUA). This EUA will remain in effect (meaning this test can be used) for the duration of the COVID-19 declaration under Section 564(b)(1) of the Act, 21 U.S.C. section 360bbb-3(b)(1), unless the authorization is terminated or revoked.     Resp Syncytial Virus by PCR NEGATIVE NEGATIVE Final    Comment: (NOTE) Fact Sheet for  Patients: BloggerCourse.com  Fact Sheet for Healthcare Providers: SeriousBroker.it  This test is not yet approved or cleared by the [place] FDA and has been authorized for detection and/or diagnosis of SARS-CoV-2 by FDA under an Emergency Use Authorization (EUA). This EUA will remain in effect (meaning this test can be used) for the duration of the COVID-19 declaration under Section 564(b)(1) of the Act, 21 U.S.C. section 360bbb-3(b)(1), unless the authorization is terminated or revoked.  Performed at [facility], [address] [place], [place] [address]          Radiology Studies: DG Chest [place] 1 View  Result Date: 06/27/2022 CLINICAL DATA:  Shortness of breath EXAM: PORTABLE CHEST 1 VIEW COMPARISON:  03/14/2021, 05/14/2022 FINDINGS: The heart size and mediastinal contours are within normal limits. Aortic atherosclerosis. Mildly prominent bibasilar interstitial markings more pronounced on the left. No pleural effusion or pneumothorax. The visualized skeletal structures are unremarkable. IMPRESSION: Mildly prominent bibasilar interstitial markings more pronounced on the left, which may reflect atelectasis versus developing infiltrate. Electronically Signed   By: Duanne Guess D.O.   On: 06/27/2022 09:59        Scheduled Meds:  aspirin EC  81 mg Oral Daily   atorvastatin  20 mg Oral Daily   carvedilol  6.25 mg Oral BID WC   enoxaparin (LOVENOX) injection  40 mg Subcutaneous Q24H   fluticasone furoate-vilanterol  1 puff Inhalation Daily   And   umeclidinium bromide  1 puff Inhalation Daily   guaiFENesin  600 mg Oral BID   lisinopril  20 mg Oral Daily   And   hydrochlorothiazide  25 mg Oral Daily   ipratropium-albuterol  3 mL Nebulization Q6H   levothyroxine  50 mcg Oral Q0600   oseltamivir  30 mg Oral BID   predniSONE  40 mg Oral Q breakfast   sodium chloride flush  3 mL Intravenous Q12H    Continuous Infusions:  sodium chloride Stopped (06/27/22 1333)   cefTRIAXone (ROCEPHIN)  IV Stopped (06/27/22 1232)     LOS: 1 day    Time spent:40 min    Brogan Jon, Roselind Messier, MD [facility]   If 7PM-7AM, please contact night-coverage 06/28/2022, 9:31 AM

## 2022-06-28 NOTE — Progress Notes (Signed)
PHARMACY NOTE:  ANTIMICROBIAL RENAL DOSAGE ADJUSTMENT  Current antimicrobial regimen includes a mismatch between antimicrobial dosage and estimated renal function.  As per policy approved by the Pharmacy & Therapeutics and Medical Executive Committees, the antimicrobial dosage will be adjusted accordingly.  Current antimicrobial dosage:  oseltamivir 75mg  BID  Indication: flu  Renal Function:  Estimated Creatinine Clearance: 45.7 mL/min (A) (by C-G formula based on SCr of 1.23 mg/dL (H)). []      On intermittent HD, scheduled: []      On CRRT    Antimicrobial dosage has been changed to:  30mg  BID  Additional comments:   Thank you for allowing pharmacy to be a part of this patient's care.  Mosetta Anis, [facility] 06/28/2022 7:51 AM

## 2022-06-28 NOTE — Progress Notes (Signed)
Echo attempted; patient and nurse asked if this could be done later. Patient getting ready to eat her meal.  Dondra Prader RVT RCS

## 2022-06-29 ENCOUNTER — Inpatient Hospital Stay (HOSPITAL_COMMUNITY): Payer: Medicare Other

## 2022-06-29 DIAGNOSIS — R7989 Other specified abnormal findings of blood chemistry: Secondary | ICD-10-CM | POA: Diagnosis not present

## 2022-06-29 DIAGNOSIS — I5020 Unspecified systolic (congestive) heart failure: Secondary | ICD-10-CM | POA: Diagnosis not present

## 2022-06-29 DIAGNOSIS — J441 Chronic obstructive pulmonary disease with (acute) exacerbation: Secondary | ICD-10-CM | POA: Diagnosis not present

## 2022-06-29 DIAGNOSIS — I5033 Acute on chronic diastolic (congestive) heart failure: Secondary | ICD-10-CM | POA: Diagnosis not present

## 2022-06-29 DIAGNOSIS — R0603 Acute respiratory distress: Secondary | ICD-10-CM | POA: Diagnosis not present

## 2022-06-29 LAB — FOLATE: Folate: 16.9 ng/mL (ref >5.9)

## 2022-06-29 LAB — FERRITIN: Ferritin: 52 ng/mL (ref 11–307)

## 2022-06-29 LAB — LIPID PANEL
Cholesterol: 162 mg/dL (ref 0–200)
HDL: 68 mg/dL (ref >40)
LDL Cholesterol: 74 mg/dL (ref 0–99)
Total CHOL/HDL Ratio: 2.4 RATIO
Triglycerides: 100 mg/dL (ref <150)
VLDL: 20 mg/dL (ref 0–40)

## 2022-06-29 LAB — RETICULOCYTES
Immature Retic Fract: 29.1 % — ABNORMAL HIGH (ref 2.3–15.9)
RBC.: 3.43 MIL/uL — ABNORMAL LOW (ref 3.87–5.11)
Retic Count, Absolute: 54.5 10*3/uL (ref 19.0–186.0)
Retic Ct Pct: 1.6 % (ref 0.4–3.1)

## 2022-06-29 LAB — ECHOCARDIOGRAM COMPLETE
Area-P 1/2: 4.86 cm2
Calc EF: 42.8 %
S' Lateral: 4.1 cm
Single Plane A2C EF: 45.3 %
Single Plane A4C EF: 38.8 %
Weight: 3444.8 oz

## 2022-06-29 LAB — IRON AND TIBC
Iron: 31 ug/dL (ref 28–170)
Saturation Ratios: 8 % — ABNORMAL LOW (ref 10.4–31.8)
TIBC: 386 ug/dL (ref 250–450)
UIBC: 355 ug/dL

## 2022-06-29 LAB — VITAMIN B12: Vitamin B-12: 1312 pg/mL — ABNORMAL HIGH (ref 180–914)

## 2022-06-29 MED ORDER — ATORVASTATIN CALCIUM 40 MG PO TABS
40.0000 mg | ORAL_TABLET | Freq: Every day | ORAL | Status: DC
  Administered 2022-06-30: 40 mg via ORAL
  Filled 2022-06-29: qty 1

## 2022-06-29 MED ORDER — IPRATROPIUM-ALBUTEROL 0.5-2.5 (3) MG/3ML IN SOLN
3.0000 mL | Freq: Two times a day (BID) | RESPIRATORY_TRACT | Status: DC
  Administered 2022-06-29 – 2022-06-30 (×3): 3 mL via RESPIRATORY_TRACT
  Filled 2022-06-29 (×3): qty 3

## 2022-06-29 NOTE — Progress Notes (Signed)
PROGRESS NOTE    April Combs  ZOX:[id number] DOB: Nov 11, 1945 DOA: 06/27/2022 PCP: Pincus Sanes, MD     Brief Narrative:   77 y.o. WF PMHx  HTN, HLD, CAD, CHF last EF 45% with grade 1 diastolic dysfunction, emphysema, and hypothyroidism   Pesents with complaints of shortness of breath which started yesterday evening while walking to the bathroom.  Overnight she was unable to lay down, sit up, or do anything without feeling short of breath.  She reported having associated symptoms of subjective fever, chills, and chest pressure.  To her knowledge she had not had any recent sick contacts.  She had been having some leg swelling over the last couple days and had taken an extra dose of Lasix.  Denies having any wheezing, nausea, vomiting, or diarrhea symptoms.  She has been unable to sleep due to her breathing.  She reports prior history of tobacco abuse but quit back in 2008.  She thinks that she may have smoked 1 pack of cigarettes per day for approximately 20-30 years.   In the emergency department patient 's O2 saturations were maintained on room air, but placed on 2 L nasal cannula oxygen for comfort.  Labs significant for WBC 10.2, D-dimer 0.45, BNP 217.8, and troponin 20. Chest x-ray noted mild bibasilar interstitial markings or pronounced on the left concerning for atelectasis versus developing infiltrate.  Patient has been treated with Lasix 40 mg IV, Solu-Medrol 125 mg IV, DuoNeb breathing treatment, aspirin, Rocephin, and azithromycin.   Influenza screening came back positive and she had been started on Tamiflu.   Subjective: 4/16 afebrile overnight, on room air A/O x 4, states SOB improved.  Negative CP.  States had echocardiogram in the cardiology clinic~2021   Assessment & Plan: Covid vaccination;   Principal Problem:   Acute respiratory distress Active Problems:   COPD with acute exacerbation   Influenza A   Elevated troponin   CAD (coronary artery disease)   HFrEF  (heart failure with reduced ejection fraction)   Essential hypertension   Normocytic anemia   Hypothyroidism   Hyperlipidemia with target LDL less than 100   Obesity (BMI 30-39.9)   Acute respiratory distress secondary to influenza A infection/COPD exacerbation -Chest x-ray noted mildly prominent bibasilar interstitial markings more pronounced on the left with concern for atelectasis versus developing infiltrate.  -4/14 procalcitonin<0.10 DC antibiotics -Complete course of Tamiflu -Prednisone 40 mg daily - DuoNeb QID -Incentive spirometry - Flutter valve -4/16 ambulatory SpO2 pending for the a.m. depending upon findings could discharge   Elevated troponin/CAD -Trend troponins  Latest Reference Range & Units 06/27/22 09:30 06/27/22 11:46  Troponin I (High Sensitivity) <18 ng/L 20 (H) 18 (H)  (H): Data is abnormally high -Not consistent with ACS    -Chronic systolic and diastolic CHF - BMP upon admission= 217.8 -2019 echocardiogram LVEF= 45%, grade 1 diastolic dysfunction - Patient does not appear fluid overloaded -Coreg 6.25 mg BID - Lisinopril 20 mg daily -HCTZ 25 mg daily -Strict in and out - Daily weight - 4/16 Echocardiogram reduced LVEF = 40 - 45%, no significant decrease from 2019 echocardiogram.   Essential hypertension -See CHF   Normocytic anemia (baselineHgB 11.7) - 4/16 Anemia panel consistent with normocytic anemia   Hypothyroidism -Check TSH -Synthroid 50 mcg daily   Hyperlipidemia -Lipitor 20 mg daily - 4/16 LDL = 74 above goal of <70  -4/16 increase Lipitor 40 mg daily   Obesity (BMI 35.83 kg/m) -Discussed with PCP  Mobility Assessment (last 72 hours)     Mobility Assessment     Row Name 06/29/22 0900 06/28/22 2117 06/28/22 0815 06/27/22 1400     Does patient have an order for bedrest or is patient medically unstable Yes- Bedfast (Level 1) - Complete No - Continue assessment No - Continue assessment No - Continue assessment     What is the highest level of mobility based on the progressive mobility assessment? Level 6 (Walks independently in room and hall) - Balance while walking in room without assist - Complete Level 6 (Walks independently in room and hall) - Balance while walking in room without assist - Complete Level 6 (Walks independently in room and hall) - Balance while walking in room without assist - Complete Level 5 (Walks with assist in room/hall) - Balance while stepping forward/back and can walk in room with assist - Complete                   DVT prophylaxis: Lovenox Code Status: Full Family Communication:  Status is: Inpatient    Dispo: The patient is from: Home              Anticipated d/c is to: Home              Anticipated d/c date is: 2 days              Patient currently is not medically stable to d/c.      Consultants:    Procedures/Significant Events:  4/16 echocardiogram  Left Ventricle: LVEF= 40 to 45%. Left ventricular ejection fraction by 3D volume is 44 %.  -The left ventricle demonstrates global hypokinesis.  -Indeterminate diastolic filling due to E-A fusion.  -Pericardium: Trivial pericardial effusion is present. The pericardial effusion is circumferential.   Mitral Valve: Blunted systolic pulmonary vein flow. Mild to moderate mitral valve regurgitation.    I have personally reviewed and interpreted all radiology studies and my findings are as above.  VENTILATOR SETTINGS:    Cultures   Antimicrobials: Anti-infectives (From admission, onward)    Start     Ordered Stop   06/28/22 1000  oseltamivir (TAMIFLU) capsule 30 mg        06/28/22 0751 07/02/22 2159   06/27/22 2200  oseltamivir (TAMIFLU) capsule 75 mg  Status:  Discontinued        06/27/22 1509 06/28/22 0751   06/27/22 1130  cefTRIAXone (ROCEPHIN) 2 g in sodium chloride 0.9 % 100 mL IVPB  Status:  Discontinued        06/27/22 1117 06/28/22 2032   06/27/22 1130  oseltamivir (TAMIFLU) capsule 75  mg        06/27/22 1121 06/27/22 1148   06/27/22 1100  cefTRIAXone (ROCEPHIN) 1 g in sodium chloride 0.9 % 100 mL IVPB  Status:  Discontinued        06/27/22 1046 06/27/22 1117   06/27/22 1100  azithromycin (ZITHROMAX) 500 mg in sodium chloride 0.9 % 250 mL IVPB        06/27/22 1046 06/27/22 1324         Devices    LINES / TUBES:      Continuous Infusions:  sodium chloride Stopped (06/27/22 1333)     Objective: Vitals:   06/29/22 0558 06/29/22 0751 06/29/22 0840 06/29/22 0843  BP: (!) 150/84 139/67    Pulse: 83 (!) 105 85   Resp: 18 17 19    Temp: 97.6 F (36.4 C) 98.1 F (36.7 C)  TempSrc: Oral Oral    SpO2: 96% 98% 97% 98%  Weight:        Intake/Output Summary (Last 24 hours) at 06/29/2022 1624 Last data filed at 06/29/2022 1000 Gross per 24 hour  Intake 1105 ml  Output --  Net 1105 ml    Filed Weights   06/27/22 1032  Weight: 97.7 kg   Physical Exam:  General: A/O x 4, No acute respiratory distress Eyes: negative scleral hemorrhage, negative anisocoria, negative icterus ENT: Negative Runny nose, negative gingival bleeding, Neck:  Negative scars, masses, torticollis, lymphadenopathy, JVD Lungs: Clear to auscultation bilaterally without wheezes or crackles Cardiovascular: Regular rate and rhythm without murmur gallop or rub normal S1 and S2 Abdomen: negative abdominal pain, nondistended, positive soft, bowel sounds, no rebound, no ascites, no appreciable mass Extremities: No significant cyanosis, clubbing, or edema bilateral lower extremities Skin: Negative rashes, lesions, ulcers Psychiatric:  Negative depression, negative anxiety, negative fatigue, negative mania  Central nervous system:  Cranial nerves II through XII intact, tongue/uvula midline, all extremities muscle strength 5/5, sensation intact throughout,negative dysarthria, negative expressive aphasia, negative receptive aphasia.   .     Data Reviewed: Care during the described time  interval was provided by me .  I have reviewed this patient's available data, including medical history, events of note, physical examination, and all test results as part of my evaluation.  CBC: Recent Labs  Lab 06/27/22 0930 06/28/22 0332  WBC 10.2 9.6  NEUTROABS 8.3*  --   HGB 11.7* 11.0*  HCT 34.9* 33.2*  MCV 97.5 97.4  PLT 163 145*    Basic Metabolic Panel: Recent Labs  Lab 06/27/22 0930 06/28/22 0332  NA 135 136  K 4.0 4.4  CL 101 99  CO2 23 25  GLUCOSE 128* 187*  BUN 23 48*  CREATININE 0.92 1.23*  CALCIUM 8.8* 8.2*    GFR: Estimated Creatinine Clearance: 45.7 mL/min (A) (by C-G formula based on SCr of 1.23 mg/dL (H)). Liver Function Tests: Recent Labs  Lab 06/27/22 0930  AST 27  ALT 21  ALKPHOS 66  BILITOT 0.5  PROT 7.2  ALBUMIN 3.9    No results for input(s): "LIPASE", "AMYLASE" in the last 168 hours. No results for input(s): "AMMONIA" in the last 168 hours. Coagulation Profile: No results for input(s): "INR", "PROTIME" in the last 168 hours. Cardiac Enzymes: No results for input(s): "CKTOTAL", "CKMB", "CKMBINDEX", "TROPONINI" in the last 168 hours. BNP (last 3 results) No results for input(s): "PROBNP" in the last 8760 hours. HbA1C: No results for input(s): "HGBA1C" in the last 72 hours. CBG: No results for input(s): "GLUCAP" in the last 168 hours. Lipid Profile: Recent Labs    06/29/22 0214  CHOL 162  HDL 68  LDLCALC 74  TRIG 100  CHOLHDL 2.4   Thyroid Function Tests: Recent Labs    06/27/22 1822  TSH 1.368    Anemia Panel: Recent Labs    06/29/22 0214  VITAMINB12 1,312*  FOLATE 16.9  FERRITIN 52  TIBC 386  IRON 31  RETICCTPCT 1.6   Sepsis Labs: Recent Labs  Lab 06/27/22 1822  PROCALCITON <0.10     Recent Results (from the past 240 hour(s))  Resp panel by RT-PCR (RSV, Flu A&B, Covid) Anterior Nasal Swab     Status: Abnormal   Collection Time: 06/27/22  9:30 AM   Specimen: Anterior Nasal Swab  Result Value Ref  Range Status   SARS Coronavirus 2 by RT PCR NEGATIVE NEGATIVE Final    Comment: (  NOTE) SARS-CoV-2 target nucleic acids are NOT DETECTED.  The SARS-CoV-2 RNA is generally detectable in upper respiratory specimens during the acute phase of infection. The lowest concentration of SARS-CoV-2 viral copies this assay can detect is 138 copies/mL. A negative result does not preclude SARS-Cov-2 infection and should not be used as the sole basis for treatment or other patient management decisions. A negative result may occur with  improper specimen collection/handling, submission of specimen other than nasopharyngeal swab, presence of viral mutation(s) within the areas targeted by this assay, and inadequate number of viral copies(<138 copies/mL). A negative result must be combined with clinical observations, patient history, and epidemiological information. The expected result is Negative.  Fact Sheet for Patients:  BloggerCourse.com  Fact Sheet for Healthcare Providers:  SeriousBroker.it  This test is no t yet approved or cleared by the [place] FDA and  has been authorized for detection and/or diagnosis of SARS-CoV-2 by FDA under an Emergency Use Authorization (EUA). This EUA will remain  in effect (meaning this test can be used) for the duration of the COVID-19 declaration under Section 564(b)(1) of the Act, 21 U.S.C.section 360bbb-3(b)(1), unless the authorization is terminated  or revoked sooner.       Influenza A by PCR POSITIVE (A) NEGATIVE Final   Influenza B by PCR NEGATIVE NEGATIVE Final    Comment: (NOTE) The Xpert Xpress SARS-CoV-2/FLU/RSV plus assay is intended as an aid in the diagnosis of influenza from Nasopharyngeal swab specimens and should not be used as a sole basis for treatment. Nasal washings and aspirates are unacceptable for Xpert Xpress SARS-CoV-2/FLU/RSV testing.  Fact Sheet for  Patients: BloggerCourse.com  Fact Sheet for Healthcare Providers: SeriousBroker.it  This test is not yet approved or cleared by the [place] FDA and has been authorized for detection and/or diagnosis of SARS-CoV-2 by FDA under an Emergency Use Authorization (EUA). This EUA will remain in effect (meaning this test can be used) for the duration of the COVID-19 declaration under Section 564(b)(1) of the Act, 21 U.S.C. section 360bbb-3(b)(1), unless the authorization is terminated or revoked.     Resp Syncytial Virus by PCR NEGATIVE NEGATIVE Final    Comment: (NOTE) Fact Sheet for Patients: BloggerCourse.com  Fact Sheet for Healthcare Providers: SeriousBroker.it  This test is not yet approved or cleared by the [place] FDA and has been authorized for detection and/or diagnosis of SARS-CoV-2 by FDA under an Emergency Use Authorization (EUA). This EUA will remain in effect (meaning this test can be used) for the duration of the COVID-19 declaration under Section 564(b)(1) of the Act, 21 U.S.C. section 360bbb-3(b)(1), unless the authorization is terminated or revoked.  Performed at [facility], [address] [place], [place] [address]   Respiratory (~20 pathogens) panel by PCR     Status: Abnormal   Collection Time: 06/28/22  7:46 PM   Specimen: Nasopharyngeal Swab; Respiratory  Result Value Ref Range Status   Adenovirus NOT DETECTED NOT DETECTED Final   Coronavirus 229E NOT DETECTED NOT DETECTED Final    Comment: (NOTE) The Coronavirus on the Respiratory Panel, DOES NOT test for the novel  Coronavirus (2019 nCoV)    Coronavirus HKU1 NOT DETECTED NOT DETECTED Final   Coronavirus NL63 NOT DETECTED NOT DETECTED Final   Coronavirus OC43 NOT DETECTED NOT DETECTED Final   Metapneumovirus NOT DETECTED NOT DETECTED Final   Rhinovirus / Enterovirus NOT DETECTED  NOT DETECTED Final   Influenza A H3 DETECTED (A) NOT DETECTED Final   Influenza  B NOT DETECTED NOT DETECTED Final   Parainfluenza Virus 1 NOT DETECTED NOT DETECTED Final   Parainfluenza Virus 2 NOT DETECTED NOT DETECTED Final   Parainfluenza Virus 3 NOT DETECTED NOT DETECTED Final   Parainfluenza Virus 4 NOT DETECTED NOT DETECTED Final   Respiratory Syncytial Virus NOT DETECTED NOT DETECTED Final   Bordetella pertussis NOT DETECTED NOT DETECTED Final   Bordetella Parapertussis NOT DETECTED NOT DETECTED Final   Chlamydophila pneumoniae NOT DETECTED NOT DETECTED Final   Mycoplasma pneumoniae NOT DETECTED NOT DETECTED Final    Comment: Performed at [facility], [address], [place], [place] [address]         Radiology Studies: ECHOCARDIOGRAM COMPLETE  Result Date: 06/29/2022    ECHOCARDIOGRAM REPORT   Patient Name:   KAIAH HOSEA Date of Exam: 06/29/2022 Medical Rec #:  [id number]         Height:       65.0 in Accession #:    [id number]        Weight:       215.3 lb Date of Birth:  1945-08-02          BSA:          2.041 m Patient Age:    76 years          BP:           139/67 mmHg Patient Gender: F                 HR:           83 bpm. Exam Location:  Inpatient Procedure: 2D Echo, 3D Echo, Cardiac Doppler and Color Doppler Indications:    I50.40* Unspecified combined systolic (congestive) and diastolic                 (congestive) heart failure  History:        Patient has prior history of Echocardiogram examinations. COPD,                 Signs/Symptoms:Dyspnea, Chest Pain and Shortness of Breath; Risk                 Factors:Hypertension, Current Smoker and Diabetes.  Sonographer:    Sheralyn Boatman RDCS Referring Phys: [id number] RONDELL A SMITH IMPRESSIONS  1. Left ventricular ejection fraction, by estimation, is 40 to 45%. Left ventricular ejection fraction by 3D volume is 44 %. The left ventricle has mildly decreased function. The left ventricle demonstrates global hypokinesis.  Indeterminate diastolic filling due to E-A fusion.  2. Right ventricular systolic function is normal. The right ventricular size is normal. Tricuspid regurgitation signal is inadequate for assessing PA pressure.  3. Two jets not well visualized. Blunted systolic pulmonary vein flow. The mitral valve is normal in structure. Mild to moderate mitral valve regurgitation. No evidence of mitral stenosis.  4. The aortic valve is tricuspid. There is mild thickening of the aortic valve. Aortic valve regurgitation is not visualized. No aortic stenosis is present.  5. The inferior vena cava is normal in size with greater than 50% respiratory variability, suggesting right atrial pressure of 3 mmHg. Comparison(s): Similar to 2019 report. FINDINGS  Left Ventricle: Left ventricular ejection fraction, by estimation, is 40 to 45%. Left ventricular ejection fraction by 3D volume is 44 %. The left ventricle has mildly decreased function. The left ventricle demonstrates global hypokinesis. The left ventricular internal cavity size was normal in size. There is no left ventricular hypertrophy. Indeterminate diastolic  filling due to E-A fusion. Right Ventricle: The right ventricular size is normal. No increase in right ventricular wall thickness. Right ventricular systolic function is normal. Tricuspid regurgitation signal is inadequate for assessing PA pressure. Left Atrium: Left atrial size was normal in size. Right Atrium: Right atrial size was normal in size. Pericardium: Trivial pericardial effusion is present. The pericardial effusion is circumferential. Mitral Valve: Two jets not well visualized. Blunted systolic pulmonary vein flow. The mitral valve is normal in structure. Mild to moderate mitral valve regurgitation. No evidence of mitral valve stenosis. Tricuspid Valve: The tricuspid valve is grossly normal. Tricuspid valve regurgitation is not demonstrated. Aortic Valve: The aortic valve is tricuspid. There is mild thickening of  the aortic valve. Aortic valve regurgitation is not visualized. No aortic stenosis is present. Pulmonic Valve: The pulmonic valve was normal in structure. Pulmonic valve regurgitation is mild. No evidence of pulmonic stenosis. Aorta: The aortic root and ascending aorta are structurally normal, with no evidence of dilitation. Venous: The inferior vena cava is normal in size with greater than 50% respiratory variability, suggesting right atrial pressure of 3 mmHg. IAS/Shunts: No atrial level shunt detected by color flow Doppler.  LEFT VENTRICLE PLAX 2D LVIDd:         5.20 cm         Diastology LVIDs:         4.10 cm         LV e' medial:    5.44 cm/s LV PW:         1.00 cm         LV E/e' medial:  16.1 LV IVS:        1.00 cm         LV e' lateral:   9.57 cm/s LVOT diam:     2.30 cm         LV E/e' lateral: 9.1 LV SV:         72 LV SV Index:   35 LVOT Area:     4.15 cm        3D Volume EF                                LV 3D EF:    Left                                             ventricul LV Volumes (MOD)                            ar LV vol d, MOD    119.0 ml                   ejection A2C:                                        fraction LV vol d, MOD    113.5 ml                   by 3D A4C:  volume is LV vol s, MOD    65.1 ml                    44 %. A2C: LV vol s, MOD    69.5 ml A4C:                           3D Volume EF: LV SV MOD A2C:   53.9 ml       3D EF:        44 % LV SV MOD A4C:   113.5 ml      LV EDV:       155 ml LV SV MOD BP:    51.1 ml       LV ESV:       87 ml                                LV SV:        67 ml RIGHT VENTRICLE             IVC RV S prime:     13.20 cm/s  IVC diam: 1.70 cm TAPSE (M-mode): 2.0 cm LEFT ATRIUM           Index        RIGHT ATRIUM           Index LA diam:      3.20 cm 1.57 cm/m   RA Area:     11.70 cm LA Vol (A2C): 55.8 ml 27.34 ml/m  RA Volume:   25.70 ml  12.59 ml/m LA Vol (A4C): 31.1 ml 15.24 ml/m  AORTIC VALVE              PULMONIC VALVE LVOT Vmax:   93.60 cm/s  PR End Diast Vel: 1.26 msec LVOT Vmean:  63.300 cm/s LVOT VTI:    0.173 m  AORTA Ao Root diam: 3.20 cm Ao Asc diam:  3.20 cm MITRAL VALVE MV Area (PHT): 4.86 cm     SHUNTS MV Decel Time: 156 msec     Systemic VTI:  0.17 m MV E velocity: 87.53 cm/s   Systemic Diam: 2.30 cm MV A velocity: 113.33 cm/s MV E/A ratio:  0.77 Riley Lam MD Electronically signed by Riley Lam MD Signature Date/Time: 06/29/2022/2:23:25 PM    Final         Scheduled Meds:  aspirin EC  81 mg Oral Daily   atorvastatin  20 mg Oral Daily   carvedilol  6.25 mg Oral BID WC   enoxaparin (LOVENOX) injection  40 mg Subcutaneous Q24H   fluticasone furoate-vilanterol  1 puff Inhalation Daily   And   umeclidinium bromide  1 puff Inhalation Daily   guaiFENesin  600 mg Oral BID   lisinopril  20 mg Oral Daily   And   hydrochlorothiazide  25 mg Oral Daily   ipratropium-albuterol  3 mL Nebulization BID   levothyroxine  50 mcg Oral Q0600   oseltamivir  30 mg Oral BID   predniSONE  40 mg Oral Q breakfast   sodium chloride flush  3 mL Intravenous Q12H   Continuous Infusions:  sodium chloride Stopped (06/27/22 1333)     LOS: 2 days    Time spent:40 min    Najir Roop, Roselind Messier, MD [facility]   If 7PM-7AM, please contact night-coverage 06/29/2022, 4:24 PM

## 2022-06-29 NOTE — Progress Notes (Signed)
  Echocardiogram 2D Echocardiogram has been performed.  April Combs 06/29/2022, 2:11 PM

## 2022-06-29 NOTE — Progress Notes (Signed)
Mobility Specialist Progress Note:   06/29/22 1150  Mobility  Activity Ambulated with assistance in hallway;Ambulated independently in hallway  Level of Assistance Standby assist, set-up cues, supervision of patient - no hands on  Assistive Device None  Distance Ambulated (ft) 400 ft  Activity Response Tolerated fair  Mobility Referral Yes  $Mobility charge 1 Mobility   Pt agreeable to mobility session. Required only supervision for safety. Pt displayed mild SOB throughout ambulation, however SpO2 WFL on RA. Pt back in bed with all needs met. Encouraged frequent OOB mobility.   Addison Lank Mobility Specialist Please contact via SecureChat or  Rehab office at [phone]

## 2022-06-30 DIAGNOSIS — R0603 Acute respiratory distress: Secondary | ICD-10-CM | POA: Diagnosis not present

## 2022-06-30 DIAGNOSIS — J101 Influenza due to other identified influenza virus with other respiratory manifestations: Secondary | ICD-10-CM | POA: Diagnosis not present

## 2022-06-30 DIAGNOSIS — I10 Essential (primary) hypertension: Secondary | ICD-10-CM | POA: Diagnosis not present

## 2022-06-30 DIAGNOSIS — J441 Chronic obstructive pulmonary disease with (acute) exacerbation: Secondary | ICD-10-CM | POA: Diagnosis not present

## 2022-06-30 LAB — BASIC METABOLIC PANEL
Anion gap: 12 (ref 5–15)
BUN: 38 mg/dL — ABNORMAL HIGH (ref 8–23)
CO2: 23 mmol/L (ref 22–32)
Calcium: 8.2 mg/dL — ABNORMAL LOW (ref 8.9–10.3)
Chloride: 101 mmol/L (ref 98–111)
Creatinine, Ser: 1.01 mg/dL — ABNORMAL HIGH (ref 0.44–1.00)
GFR, Estimated: 58 mL/min — ABNORMAL LOW (ref >60)
Glucose, Bld: 136 mg/dL — ABNORMAL HIGH (ref 70–99)
Potassium: 3.4 mmol/L — ABNORMAL LOW (ref 3.5–5.1)
Sodium: 136 mmol/L (ref 135–145)

## 2022-06-30 MED ORDER — GUAIFENESIN ER 600 MG PO TB12: 600.0000 mg | ORAL_TABLET | Freq: Two times a day (BID) | ORAL | Status: AC

## 2022-06-30 MED ORDER — PREDNISONE 20 MG PO TABS: 40.0000 mg | ORAL_TABLET | Freq: Every day | ORAL | 0 refills | Status: AC

## 2022-06-30 MED ORDER — ATORVASTATIN CALCIUM 40 MG PO TABS: 40.0000 mg | ORAL_TABLET | Freq: Every day | ORAL | 1 refills | Status: DC

## 2022-06-30 MED ORDER — ALBUTEROL SULFATE HFA 108 (90 BASE) MCG/ACT IN AERS: 2.0000 | INHALATION_SPRAY | Freq: Four times a day (QID) | RESPIRATORY_TRACT | 0 refills | Status: DC | PRN

## 2022-06-30 MED ORDER — ASPIRIN 81 MG PO TBEC: 81.0000 mg | DELAYED_RELEASE_TABLET | Freq: Every day | ORAL | 12 refills | Status: AC

## 2022-06-30 MED ORDER — CARVEDILOL 6.25 MG PO TABS: 6.2500 mg | ORAL_TABLET | Freq: Two times a day (BID) | ORAL | 1 refills | Status: DC

## 2022-06-30 MED ORDER — OSELTAMIVIR PHOSPHATE 30 MG PO CAPS: 30.0000 mg | ORAL_CAPSULE | Freq: Two times a day (BID) | ORAL | 0 refills | Status: AC

## 2022-06-30 MED ORDER — POTASSIUM CHLORIDE CRYS ER 20 MEQ PO TBCR
40.0000 meq | EXTENDED_RELEASE_TABLET | Freq: Once | ORAL | Status: AC
  Administered 2022-06-30: 40 meq via ORAL
  Filled 2022-06-30: qty 2

## 2022-06-30 NOTE — Care Management Important Message (Signed)
Important Message  Patient Details  Name: April Combs MRN: [id number] Date of Birth: [birth year]   Medicare Important Message Given:  Yes     Renie Ora 06/30/2022, 10:15 AM

## 2022-06-30 NOTE — Discharge Summary (Signed)
Physician Discharge Summary   Patient: April Combs MRN: [id number] DOB: 02-Jun-1945  Admit date:     06/27/2022  Discharge date: 06/30/22  Discharge Physician: Kathlen Mody   PCP: Pincus Sanes, MD   Recommendations at discharge:  Please follow up with PCP in one week.   Discharge Diagnoses: Principal Problem:   Acute respiratory distress Active Problems:   COPD with acute exacerbation   Influenza A   Elevated troponin   CAD (coronary artery disease)   HFrEF (heart failure with reduced ejection fraction)   Essential hypertension   Normocytic anemia   Hypothyroidism   Hyperlipidemia with target LDL less than 100   Obesity (BMI 30-39.9)    Hospital Course: 77 y.o. WF PMHx  HTN, HLD, CAD, CHF last EF 45% with grade 1 diastolic dysfunction, emphysema, and hypothyroidism Presents with complaints of shortness of breath which started one day prior to admission.   Chest x-ray noted mild bibasilar interstitial markings or pronounced on the left concerning for atelectasis versus developing infiltrate.  Patient has been treated with Lasix 40 mg IV, Solu-Medrol 125 mg IV, DuoNeb breathing treatment, aspirin, Rocephin, and azithromycin.   Influenza screening came back positive and she had been started on Tamiflu.    Assessment and Plan:  Acute respiratory distress secondary to influenza A infection and COPD exacerbation Improvement in breathing and occasional cough. Continue with Tamiflu and prednisone on discharge and resume home inhalers.   Elevated troponins Not consistent with ACS. Patient currently denies any chest pain.    Chronic systolic and diastolic heart failure Echocardiogram this admission similar to the one in 2019 with left ventricular ejection fraction of 45% and grade 1 diastolic dysfunction. Patient does not appear to be fluid overloaded and in distress at this time. Resume home medication and follow-up with cardiology as scheduled.    Essential  hypertension Blood pressure parameters appear to be optimal    Anemia of chronic disease Normocytic anemia and hemoglobin stable around 11    Hypothyroidism Continue with Synthroid    Hyperlipidemia Continue with statin    Obesity/ Body mass index is 35.83 kg/m. Recommend outpatient follow-up with PCP for weight loss     Consultants: none.  Procedures performed: echo  Disposition: Home Diet recommendation:  Discharge Diet Orders (From admission, onward)     Start     Ordered   06/30/22 0000  Diet - low sodium heart healthy        06/30/22 1000           Regular diet DISCHARGE MEDICATION: Allergies as of 06/30/2022       Reactions   Semaglutide Diarrhea   Rybelsus - nausea and diarrhea        Medication List     STOP taking these medications    cyanocobalamin 1000 MCG tablet Commonly known as: VITAMIN B12       TAKE these medications    acetaminophen 325 MG tablet Commonly known as: TYLENOL Take 2 tablets (650 mg total) by mouth every 6 (six) hours as needed for mild pain (or Fever >/= 101).   albuterol (2.5 MG/3ML) 0.083% nebulizer solution Commonly known as: PROVENTIL USE 1 VIAL IN NEBULIZER EVERY 6 HOURS AS NEEDED FOR WHEEZING FOR SHORTNESS OF BREATH What changed: See the new instructions.   albuterol 108 (90 Base) MCG/ACT inhaler Commonly known as: VENTOLIN HFA Inhale 2 puffs into the lungs every 6 (six) hours as needed for wheezing or shortness of breath. What changed: Another medication  with the same name was changed. Make sure you understand how and when to take each.   allopurinol 300 MG tablet Commonly known as: ZYLOPRIM TAKE 1 TABLET BY MOUTH  DAILY   aspirin EC 81 MG tablet Take 1 tablet (81 mg total) by mouth daily. Swallow whole. Start taking on: July 01, 2022   atorvastatin 40 MG tablet Commonly known as: LIPITOR Take 1 tablet (40 mg total) by mouth daily. Start taking on: July 01, 2022 What changed:  medication  strength how much to take   carvedilol 12.5 MG tablet Commonly known as: COREG TAKE 1 AND 1/2 TABLETS BY MOUTH  TWICE DAILY WITH A MEAL What changed: See the new instructions.   furosemide 20 MG tablet Commonly known as: LASIX Take 1 tablet (20 mg total) by mouth daily as needed. What changed: reasons to take this   gabapentin 100 MG capsule Commonly known as: NEURONTIN TAKE 1 CAPSULE BY MOUTH IN  THE MORNING AND 2 CAPSULES  BY MOUTH AT BEDTIME What changed:  how much to take how to take this when to take this additional instructions   guaiFENesin 600 MG 12 hr tablet Commonly known as: MUCINEX Take 1 tablet (600 mg total) by mouth 2 (two) times daily.   levothyroxine 50 MCG tablet Commonly known as: SYNTHROID Take 1 tablet by mouth once daily   lisinopril-hydrochlorothiazide 20-25 MG tablet Commonly known as: ZESTORETIC TAKE 1 TABLET BY MOUTH DAILY   omeprazole 20 MG capsule Commonly known as: PRILOSEC TAKE 1 CAPSULE BY MOUTH  TWICE DAILY BEFORE A MEAL What changed: See the new instructions.   oseltamivir 30 MG capsule Commonly known as: TAMIFLU Take 1 capsule (30 mg total) by mouth 2 (two) times daily for 3 days.   predniSONE 20 MG tablet Commonly known as: DELTASONE Take 2 tablets (40 mg total) by mouth daily with breakfast for 3 days. Start taking on: July 01, 2022   Trelegy Ellipta 100-62.5-25 MCG/ACT Aepb Generic drug: Fluticasone-Umeclidin-Vilant Inhale 1 puff into the lungs daily. Plans to start bevespi after completing rx for trelegy        Follow-up Information     Pincus Sanes, MD. Schedule an appointment as soon as possible for a visit in 1 week(s).   Specialty: Internal Medicine Contact information: [address] [place] [address] [phone]                Discharge Exam: Ceasar Mons Weights   06/27/22 1032  Weight: 97.7 kg   General exam: Appears calm and comfortable  Respiratory system: Clear to auscultation.  Respiratory effort normal. Cardiovascular system: S1 & S2 heard, RRR. No JVD, murmurs, rubs, gallops or clicks. No pedal edema. Gastrointestinal system: Abdomen is nondistended, soft and nontender. No organomegaly or masses felt. Normal bowel sounds heard. Central nervous system: Alert and oriented. No focal neurological deficits. Extremities: Symmetric 5 x 5 power. Skin: No rashes, lesions or ulcers Psychiatry: Judgement and insight appear normal. Mood & affect appropriate.    Condition at discharge: fair  The results of significant diagnostics from this hospitalization (including imaging, microbiology, ancillary and laboratory) are listed below for reference.   Imaging Studies: ECHOCARDIOGRAM COMPLETE  Result Date: 06/29/2022    ECHOCARDIOGRAM REPORT   Patient Name:   SHARY LAMOS Date of Exam: 06/29/2022 Medical Rec #:  [id number]         Height:       65.0 in Accession #:    [id number]  Weight:       215.3 lb Date of Birth:  1945/07/14          BSA:          2.041 m Patient Age:    76 years          BP:           139/67 mmHg Patient Gender: F                 HR:           83 bpm. Exam Location:  Inpatient Procedure: 2D Echo, 3D Echo, Cardiac Doppler and Color Doppler Indications:    I50.40* Unspecified combined systolic (congestive) and diastolic                 (congestive) heart failure  History:        Patient has prior history of Echocardiogram examinations. COPD,                 Signs/Symptoms:Dyspnea, Chest Pain and Shortness of Breath; Risk                 Factors:Hypertension, Current Smoker and Diabetes.  Sonographer:    Sheralyn Boatman RDCS Referring Phys: [id number] RONDELL A SMITH IMPRESSIONS  1. Left ventricular ejection fraction, by estimation, is 40 to 45%. Left ventricular ejection fraction by 3D volume is 44 %. The left ventricle has mildly decreased function. The left ventricle demonstrates global hypokinesis. Indeterminate diastolic filling due to E-A fusion.  2. Right  ventricular systolic function is normal. The right ventricular size is normal. Tricuspid regurgitation signal is inadequate for assessing PA pressure.  3. Two jets not well visualized. Blunted systolic pulmonary vein flow. The mitral valve is normal in structure. Mild to moderate mitral valve regurgitation. No evidence of mitral stenosis.  4. The aortic valve is tricuspid. There is mild thickening of the aortic valve. Aortic valve regurgitation is not visualized. No aortic stenosis is present.  5. The inferior vena cava is normal in size with greater than 50% respiratory variability, suggesting right atrial pressure of 3 mmHg. Comparison(s): Similar to 2019 report. FINDINGS  Left Ventricle: Left ventricular ejection fraction, by estimation, is 40 to 45%. Left ventricular ejection fraction by 3D volume is 44 %. The left ventricle has mildly decreased function. The left ventricle demonstrates global hypokinesis. The left ventricular internal cavity size was normal in size. There is no left ventricular hypertrophy. Indeterminate diastolic filling due to E-A fusion. Right Ventricle: The right ventricular size is normal. No increase in right ventricular wall thickness. Right ventricular systolic function is normal. Tricuspid regurgitation signal is inadequate for assessing PA pressure. Left Atrium: Left atrial size was normal in size. Right Atrium: Right atrial size was normal in size. Pericardium: Trivial pericardial effusion is present. The pericardial effusion is circumferential. Mitral Valve: Two jets not well visualized. Blunted systolic pulmonary vein flow. The mitral valve is normal in structure. Mild to moderate mitral valve regurgitation. No evidence of mitral valve stenosis. Tricuspid Valve: The tricuspid valve is grossly normal. Tricuspid valve regurgitation is not demonstrated. Aortic Valve: The aortic valve is tricuspid. There is mild thickening of the aortic valve. Aortic valve regurgitation is not  visualized. No aortic stenosis is present. Pulmonic Valve: The pulmonic valve was normal in structure. Pulmonic valve regurgitation is mild. No evidence of pulmonic stenosis. Aorta: The aortic root and ascending aorta are structurally normal, with no evidence of dilitation. Venous: The inferior vena cava is normal in  size with greater than 50% respiratory variability, suggesting right atrial pressure of 3 mmHg. IAS/Shunts: No atrial level shunt detected by color flow Doppler.  LEFT VENTRICLE PLAX 2D LVIDd:         5.20 cm         Diastology LVIDs:         4.10 cm         LV e' medial:    5.44 cm/s LV PW:         1.00 cm         LV E/e' medial:  16.1 LV IVS:        1.00 cm         LV e' lateral:   9.57 cm/s LVOT diam:     2.30 cm         LV E/e' lateral: 9.1 LV SV:         72 LV SV Index:   35 LVOT Area:     4.15 cm        3D Volume EF                                LV 3D EF:    Left                                             ventricul LV Volumes (MOD)                            ar LV vol d, MOD    119.0 ml                   ejection A2C:                                        fraction LV vol d, MOD    113.5 ml                   by 3D A4C:                                        volume is LV vol s, MOD    65.1 ml                    44 %. A2C: LV vol s, MOD    69.5 ml A4C:                           3D Volume EF: LV SV MOD A2C:   53.9 ml       3D EF:        44 % LV SV MOD A4C:   113.5 ml      LV EDV:       155 ml LV SV MOD BP:    51.1 ml       LV ESV:       87 ml  LV SV:        67 ml RIGHT VENTRICLE             IVC RV S prime:     13.20 cm/s  IVC diam: 1.70 cm TAPSE (M-mode): 2.0 cm LEFT ATRIUM           Index        RIGHT ATRIUM           Index LA diam:      3.20 cm 1.57 cm/m   RA Area:     11.70 cm LA Vol (A2C): 55.8 ml 27.34 ml/m  RA Volume:   25.70 ml  12.59 ml/m LA Vol (A4C): 31.1 ml 15.24 ml/m  AORTIC VALVE             PULMONIC VALVE LVOT Vmax:   93.60 cm/s  PR End Diast Vel:  1.26 msec LVOT Vmean:  63.300 cm/s LVOT VTI:    0.173 m  AORTA Ao Root diam: 3.20 cm Ao Asc diam:  3.20 cm MITRAL VALVE MV Area (PHT): 4.86 cm     SHUNTS MV Decel Time: 156 msec     Systemic VTI:  0.17 m MV E velocity: 87.53 cm/s   Systemic Diam: 2.30 cm MV A velocity: 113.33 cm/s MV E/A ratio:  0.77 Riley Lam MD Electronically signed by Riley Lam MD Signature Date/Time: 06/29/2022/2:23:25 PM    Final    DG Chest Port 1 View  Result Date: 06/27/2022 CLINICAL DATA:  Shortness of breath EXAM: PORTABLE CHEST 1 VIEW COMPARISON:  03/14/2021, 05/14/2022 FINDINGS: The heart size and mediastinal contours are within normal limits. Aortic atherosclerosis. Mildly prominent bibasilar interstitial markings more pronounced on the left. No pleural effusion or pneumothorax. The visualized skeletal structures are unremarkable. IMPRESSION: Mildly prominent bibasilar interstitial markings more pronounced on the left, which may reflect atelectasis versus developing infiltrate. Electronically Signed   By: Duanne Guess D.O.   On: 06/27/2022 09:59    Microbiology: Results for orders placed or performed during the hospital encounter of 06/27/22  Resp panel by RT-PCR (RSV, Flu A&B, Covid) Anterior Nasal Swab     Status: Abnormal   Collection Time: 06/27/22  9:30 AM   Specimen: Anterior Nasal Swab  Result Value Ref Range Status   SARS Coronavirus 2 by RT PCR NEGATIVE NEGATIVE Final    Comment: (NOTE) SARS-CoV-2 target nucleic acids are NOT DETECTED.  The SARS-CoV-2 RNA is generally detectable in upper respiratory specimens during the acute phase of infection. The lowest concentration of SARS-CoV-2 viral copies this assay can detect is 138 copies/mL. A negative result does not preclude SARS-Cov-2 infection and should not be used as the sole basis for treatment or other patient management decisions. A negative result may occur with  improper specimen collection/handling, submission of specimen  other than nasopharyngeal swab, presence of viral mutation(s) within the areas targeted by this assay, and inadequate number of viral copies(<138 copies/mL). A negative result must be combined with clinical observations, patient history, and epidemiological information. The expected result is Negative.  Fact Sheet for Patients:  BloggerCourse.com  Fact Sheet for Healthcare Providers:  SeriousBroker.it  This test is no t yet approved or cleared by the [place] FDA and  has been authorized for detection and/or diagnosis of SARS-CoV-2 by FDA under an Emergency Use Authorization (EUA). This EUA will remain  in effect (meaning this test can be used) for the duration of the COVID-19 declaration under Section 564(b)(1) of the Act, 21 U.S.C.section 360bbb-3(b)(1),  unless the authorization is terminated  or revoked sooner.       Influenza A by PCR POSITIVE (A) NEGATIVE Final   Influenza B by PCR NEGATIVE NEGATIVE Final    Comment: (NOTE) The Xpert Xpress SARS-CoV-2/FLU/RSV plus assay is intended as an aid in the diagnosis of influenza from Nasopharyngeal swab specimens and should not be used as a sole basis for treatment. Nasal washings and aspirates are unacceptable for Xpert Xpress SARS-CoV-2/FLU/RSV testing.  Fact Sheet for Patients: BloggerCourse.com  Fact Sheet for Healthcare Providers: SeriousBroker.it  This test is not yet approved or cleared by the [place] FDA and has been authorized for detection and/or diagnosis of SARS-CoV-2 by FDA under an Emergency Use Authorization (EUA). This EUA will remain in effect (meaning this test can be used) for the duration of the COVID-19 declaration under Section 564(b)(1) of the Act, 21 U.S.C. section 360bbb-3(b)(1), unless the authorization is terminated or revoked.     Resp Syncytial Virus by PCR NEGATIVE NEGATIVE Final     Comment: (NOTE) Fact Sheet for Patients: BloggerCourse.com  Fact Sheet for Healthcare Providers: SeriousBroker.it  This test is not yet approved or cleared by the [place] FDA and has been authorized for detection and/or diagnosis of SARS-CoV-2 by FDA under an Emergency Use Authorization (EUA). This EUA will remain in effect (meaning this test can be used) for the duration of the COVID-19 declaration under Section 564(b)(1) of the Act, 21 U.S.C. section 360bbb-3(b)(1), unless the authorization is terminated or revoked.  Performed at [facility], [address] [place], [place] [address]   Respiratory (~20 pathogens) panel by PCR     Status: Abnormal   Collection Time: 06/28/22  7:46 PM   Specimen: Nasopharyngeal Swab; Respiratory  Result Value Ref Range Status   Adenovirus NOT DETECTED NOT DETECTED Final   Coronavirus 229E NOT DETECTED NOT DETECTED Final    Comment: (NOTE) The Coronavirus on the Respiratory Panel, DOES NOT test for the novel  Coronavirus (2019 nCoV)    Coronavirus HKU1 NOT DETECTED NOT DETECTED Final   Coronavirus NL63 NOT DETECTED NOT DETECTED Final   Coronavirus OC43 NOT DETECTED NOT DETECTED Final   Metapneumovirus NOT DETECTED NOT DETECTED Final   Rhinovirus / Enterovirus NOT DETECTED NOT DETECTED Final   Influenza A H3 DETECTED (A) NOT DETECTED Final   Influenza B NOT DETECTED NOT DETECTED Final   Parainfluenza Virus 1 NOT DETECTED NOT DETECTED Final   Parainfluenza Virus 2 NOT DETECTED NOT DETECTED Final   Parainfluenza Virus 3 NOT DETECTED NOT DETECTED Final   Parainfluenza Virus 4 NOT DETECTED NOT DETECTED Final   Respiratory Syncytial Virus NOT DETECTED NOT DETECTED Final   Bordetella pertussis NOT DETECTED NOT DETECTED Final   Bordetella Parapertussis NOT DETECTED NOT DETECTED Final   Chlamydophila pneumoniae NOT DETECTED NOT DETECTED Final   Mycoplasma pneumoniae NOT DETECTED NOT  DETECTED Final    Comment: Performed at [facility], [address], [place], [place] [address]    Labs: CBC: Recent Labs  Lab 06/27/22 0930 06/28/22 0332  WBC 10.2 9.6  NEUTROABS 8.3*  --   HGB 11.7* 11.0*  HCT 34.9* 33.2*  MCV 97.5 97.4  PLT 163 145*   Basic Metabolic Panel: Recent Labs  Lab 06/27/22 0930 06/28/22 0332 06/30/22 0848  NA 135 136 136  K 4.0 4.4 3.4*  CL 101 99 101  CO2 23 25 23   GLUCOSE 128* 187* 136*  BUN 23 48* 38*  CREATININE  0.92 1.23* 1.01*  CALCIUM 8.8* 8.2* 8.2*   Liver Function Tests: Recent Labs  Lab 06/27/22 0930  AST 27  ALT 21  ALKPHOS 66  BILITOT 0.5  PROT 7.2  ALBUMIN 3.9   CBG: No results for input(s): "GLUCAP" in the last 168 hours.  Discharge time spent: 41 minutes.   Signed: Kathlen Mody, MD [facility] 06/30/2022

## 2022-07-01 ENCOUNTER — Encounter: Payer: Self-pay | Admitting: *Deleted

## 2022-07-01 ENCOUNTER — Telehealth: Payer: Self-pay | Admitting: *Deleted

## 2022-07-01 ENCOUNTER — Ambulatory Visit: Payer: Self-pay

## 2022-07-01 NOTE — Transitions of Care (Post Inpatient/ED Visit) (Signed)
   07/01/2022  Name: April Combs MRN: [id number] DOB: 1945-03-25  Today's TOC FU Call Status: Today's TOC FU Call Status:: Successful TOC FU Call Competed TOC FU Call Complete Date: 07/01/22  Transition Care Management Follow-up Telephone Call Date of Discharge: 06/30/22 Discharge Facility: [facility]) Type of Discharge: Inpatient Admission Primary Inpatient Discharge Diagnosis:: Acute respiratory Distress secondary to Influenza A; COPD exacerbation How have you been since you were released from the hospital?: Better ("I am much better; things seem to be back to normal and I am feeling good, I got all the new medication and am taking it like I am supposed to.  I am independent and able to do everything on my own") Any questions or concerns?: No  Items Reviewed: Did you receive and understand the discharge instructions provided?: Yes (thoroughly reviewed with patient who verbalizes good understanding of same) Medications obtained and verified?: Yes (Medications Reviewed) (Full medication reconciliation/ review completed; no concerns or discrepancies identified; confirmed patient obtained/ is taking all newly Rx'd medications as instructed; self-manages medications and denies questions/ concerns around medications today) Any new allergies since your discharge?: No Dietary orders reviewed?: Yes Type of Diet Ordered:: "Healthy" Do you have support at home?: Yes People in Home: alone Name of Support/Comfort Primary Source: Resides alone; independent in self-care activities; local daughters/ family live nearby and assist as/ if indicated  Home Care and Equipment/Supplies: Were Home Health Services Ordered?: No Any new equipment or medical supplies ordered?: No  Functional Questionnaire: Do you need assistance with bathing/showering or dressing?: No Do you need assistance with meal preparation?: No Do you need assistance with eating?: No Do you have difficulty maintaining  continence: No Do you need assistance with getting out of bed/getting out of a chair/moving?: No Do you have difficulty managing or taking your medications?: No  Follow up appointments reviewed: PCP Follow-up appointment confirmed?: Yes Date of PCP follow-up appointment?: 07/07/22 Follow-up Provider: PCP Specialist Hospital Follow-up appointment confirmed?: NA (verified not indicated per hospital discharging provider discharge notes) Do you need transportation to your follow-up appointment?: No Do you understand care options if your condition(s) worsen?: Yes-patient verbalized understanding  SDOH Interventions Today    Flowsheet Row Most Recent Value  SDOH Interventions   Food Insecurity Interventions Intervention Not Indicated  Transportation Interventions Intervention Not Indicated  [drives self]      TOC Interventions Today    Flowsheet Row Most Recent Value  TOC Interventions   TOC Interventions Discussed/Reviewed TOC Interventions Discussed      Interventions Today    Flowsheet Row Most Recent Value  Chronic Disease   Chronic disease during today's visit Other, Chronic Obstructive Pulmonary Disease (COPD)  [Acute Respiratory distress secondary to influenza A]  General Interventions   General Interventions Discussed/Reviewed Doctor Visits, General Interventions Discussed  Doctor Visits Discussed/Reviewed PCP, Doctor Visits Discussed  PCP/Specialist Visits Compliance with follow-up visit  Nutrition Interventions   Nutrition Discussed/Reviewed Nutrition Discussed  Pharmacy Interventions   Pharmacy Dicussed/Reviewed Pharmacy Topics Discussed  [Full medication review with updating medication list in EHR per patient report]  Safety Interventions   Safety Discussed/Reviewed Safety Discussed  [confirmed not currently requiring/ using assistive devices ,  does not use at baseline]      Caryl Pina, RN, BSN, CCRN Alumnus RN CM Care Coordination/ Transition of  Care- [facility] Care Management [phone]: direct office

## 2022-07-01 NOTE — Chronic Care Management (AMB) (Signed)
   07/01/2022  April Combs February 23, 1946 [id number]   Reason for Encounter: Patient is not currently enrolled in the CCM program. CCM status changed to previously enrolled.   Katha Cabal RN Care Manager/Chronic Care Management [phone]

## 2022-07-06 ENCOUNTER — Encounter: Payer: Self-pay | Admitting: Internal Medicine

## 2022-07-06 DIAGNOSIS — I5042 Chronic combined systolic (congestive) and diastolic (congestive) heart failure: Secondary | ICD-10-CM | POA: Insufficient documentation

## 2022-07-06 NOTE — Progress Notes (Unsigned)
Subjective:    Patient ID: April Combs, female    DOB: September 10, 1945, 77 y.o.   MRN: [id number]     HPI April Combs is here for follow up from the hospital.  Admitted 4/14 - 4/17 for SOB  SOB started the day prior while walking to the bathroom.  She was SOB with all activities - sitting, laying.  No F/C, CP, wheeze.  She did have leg edema and took an extra lasix.    In ED O2 was nml but placed on 2 L Fidelis for comfort.  WBC  10.2, d-dimer 0.45, BNP 217.8, troponin 20.  CXR mild bibasilar interstitial marking, pronounced on the left concerning for atelectasis vs developing infiltrate.  Had lasix 40 mg IV, solu-medrol 125 mg IV, Duoneb treatment, ASA, Rochephin, azithromycin. Influenza positive - started on tamiflu.     Acute respiratory distress secondary to influenza A and copd exacerbation Improvement in breathing, occ cough Continue tamiflu and prednisone on d/c Resume home inhalers  Elevated troponins: Not consistent with ACS Denied any chest pain  Chronic systolic and diastolic heart failure: Echo this admission EF 45%, grade 1 DD No fluid overload on dc Resumed home meds  Htn, anemia, hypothyroid, hld  - stable Continued on home meds  Medications and allergies reviewed with patient and updated if appropriate.  Current Outpatient Medications on File Prior to Visit  Medication Sig Dispense Refill   acetaminophen (TYLENOL) 325 MG tablet Take 2 tablets (650 mg total) by mouth every 6 (six) hours as needed for mild pain (or Fever >/= 101).     albuterol (PROVENTIL) (2.5 MG/3ML) 0.083% nebulizer solution USE 1 VIAL IN NEBULIZER EVERY 6 HOURS AS NEEDED FOR WHEEZING FOR SHORTNESS OF BREATH (Patient taking differently: Take 2.5 mg by nebulization in the morning and at bedtime.) 150 mL 0   albuterol (VENTOLIN HFA) 108 (90 Base) MCG/ACT inhaler Inhale 2 puffs into the lungs every 6 (six) hours as needed for wheezing or shortness of breath. 18 g 0   allopurinol (ZYLOPRIM) 300 MG  tablet TAKE 1 TABLET BY MOUTH  DAILY (Patient taking differently: Take 300 mg by mouth daily.) 90 tablet 3   aspirin EC 81 MG tablet Take 1 tablet (81 mg total) by mouth daily. Swallow whole. 30 tablet 12   atorvastatin (LIPITOR) 40 MG tablet Take 1 tablet (40 mg total) by mouth daily. 30 tablet 1   carvedilol (COREG) 12.5 MG tablet TAKE 1 AND 1/2 TABLETS BY MOUTH  TWICE DAILY WITH A MEAL (Patient taking differently: Take 18.75 mg by mouth 2 (two) times daily with a meal.) 270 tablet 3   Fluticasone-Umeclidin-Vilant (TRELEGY ELLIPTA) 100-62.5-25 MCG/ACT AEPB Inhale 1 puff into the lungs daily. Plans to start bevespi after completing rx for trelegy     furosemide (LASIX) 20 MG tablet Take 1 tablet (20 mg total) by mouth daily as needed. (Patient taking differently: Take 20 mg by mouth daily as needed for fluid.) 45 tablet 3   gabapentin (NEURONTIN) 100 MG capsule TAKE 1 CAPSULE BY MOUTH IN  THE MORNING AND 2 CAPSULES  BY MOUTH AT BEDTIME (Patient taking differently: Take 100-200 mg by mouth See admin instructions. Take 100 mg by mouth in the morning and 200 mg by mouth at night) 270 capsule 3   guaiFENesin (MUCINEX) 600 MG 12 hr tablet Take 1 tablet (600 mg total) by mouth 2 (two) times daily.     levothyroxine (SYNTHROID) 50 MCG tablet Take 1 tablet by  mouth once daily (Patient taking differently: Take 50 mcg by mouth daily.) 90 tablet 0   lisinopril-hydrochlorothiazide (ZESTORETIC) 20-25 MG tablet TAKE 1 TABLET BY MOUTH DAILY 90 tablet 1   omeprazole (PRILOSEC) 20 MG capsule TAKE 1 CAPSULE BY MOUTH  TWICE DAILY BEFORE A MEAL (Patient taking differently: Take 20 mg by mouth 2 (two) times daily before a meal.) 180 capsule 3   No current facility-administered medications on file prior to visit.     Review of Systems     Objective:  There were no vitals filed for this visit. BP Readings from Last 3 Encounters:  06/30/22 139/81  03/23/22 128/74  02/19/22 138/72   Wt Readings from Last 3  Encounters:  06/27/22 215 lb 4.8 oz (97.7 kg)  03/23/22 208 lb (94.3 kg)  02/19/22 208 lb (94.3 kg)   There is no height or weight on file to calculate BMI.    Physical Exam     Lab Results  Component Value Date   WBC 9.6 06/28/2022   HGB 11.0 (L) 06/28/2022   HCT 33.2 (L) 06/28/2022   PLT 145 (L) 06/28/2022   GLUCOSE 136 (H) 06/30/2022   CHOL 162 06/29/2022   TRIG 100 06/29/2022   HDL 68 06/29/2022   LDLDIRECT 74.0 08/20/2020   LDLCALC 74 06/29/2022   ALT 21 06/27/2022   AST 27 06/27/2022   NA 136 06/30/2022   K 3.4 (L) 06/30/2022   CL 101 06/30/2022   CREATININE 1.01 (H) 06/30/2022   BUN 38 (H) 06/30/2022   CO2 23 06/30/2022   TSH 1.368 06/27/2022   INR 1.08 01/28/2014   HGBA1C 6.0 02/19/2022   MICROALBUR <0.7 08/20/2021    ECHOCARDIOGRAM COMPLETE    ECHOCARDIOGRAM REPORT       Patient Name:   April Combs Date of Exam: 06/29/2022 Medical Rec #:  [id number]         Height:       65.0 in Accession #:    [id number]        Weight:       215.3 lb Date of Birth:  15-Feb-1946          BSA:          2.041 m Patient Age:    76 years          BP:           139/67 mmHg Patient Gender: F                 HR:           83 bpm. Exam Location:  Inpatient  Procedure: 2D Echo, 3D Echo, Cardiac Doppler and Color Doppler  Indications:    I50.40* Unspecified combined systolic (congestive) and diastolic                 (congestive) heart failure   History:        Patient has prior history of Echocardiogram examinations. COPD,                 Signs/Symptoms:Dyspnea, Chest Pain and Shortness of Breath; Risk                 Factors:Hypertension, Current Smoker and Diabetes.   Sonographer:    Sheralyn Boatman RDCS Referring Phys: [id number] April Combs  IMPRESSIONS   1. Left ventricular ejection fraction, by estimation, is 40 to 45%. Left ventricular ejection fraction by 3D volume is 44 %. The left ventricle has mildly  decreased function. The left ventricle demonstrates global  hypokinesis. Indeterminate diastolic  filling due to E-A fusion.  2. Right ventricular systolic function is normal. The right ventricular size is normal. Tricuspid regurgitation signal is inadequate for assessing PA pressure.  3. Two jets not well visualized. Blunted systolic pulmonary vein flow. The mitral valve is normal in structure. Mild to moderate mitral valve regurgitation. No evidence of mitral stenosis.  4. The aortic valve is tricuspid. There is mild thickening of the aortic valve. Aortic valve regurgitation is not visualized. No aortic stenosis is present.  5. The inferior vena cava is normal in size with greater than 50% respiratory variability, suggesting right atrial pressure of 3 mmHg.  Comparison(s): Similar to 2019 report.  FINDINGS  Left Ventricle: Left ventricular ejection fraction, by estimation, is 40 to 45%. Left ventricular ejection fraction by 3D volume is 44 %. The left ventricle has mildly decreased function. The left ventricle demonstrates global hypokinesis. The left  ventricular internal cavity size was normal in size. There is no left ventricular hypertrophy. Indeterminate diastolic filling due to E-A fusion.  Right Ventricle: The right ventricular size is normal. No increase in right ventricular wall thickness. Right ventricular systolic function is normal. Tricuspid regurgitation signal is inadequate for assessing PA pressure.  Left Atrium: Left atrial size was normal in size.  Right Atrium: Right atrial size was normal in size.  Pericardium: Trivial pericardial effusion is present. The pericardial effusion is circumferential.  Mitral Valve: Two jets not well visualized. Blunted systolic pulmonary vein flow. The mitral valve is normal in structure. Mild to moderate mitral valve regurgitation. No evidence of mitral valve stenosis.  Tricuspid Valve: The tricuspid valve is grossly normal. Tricuspid valve regurgitation is not demonstrated.  Aortic Valve: The  aortic valve is tricuspid. There is mild thickening of the aortic valve. Aortic valve regurgitation is not visualized. No aortic stenosis is present.  Pulmonic Valve: The pulmonic valve was normal in structure. Pulmonic valve regurgitation is mild. No evidence of pulmonic stenosis.  Aorta: The aortic root and ascending aorta are structurally normal, with no evidence of dilitation.  Venous: The inferior vena cava is normal in size with greater than 50% respiratory variability, suggesting right atrial pressure of 3 mmHg.  IAS/Shunts: No atrial level shunt detected by color flow Doppler.    LEFT VENTRICLE PLAX 2D LVIDd:         5.20 cm         Diastology LVIDs:         4.10 cm         LV e' medial:    5.44 cm/s LV PW:         1.00 cm         LV E/e' medial:  16.1 LV IVS:        1.00 cm         LV e' lateral:   9.57 cm/s LVOT diam:     2.30 cm         LV E/e' lateral: 9.1 LV SV:         72 LV SV Index:   35 LVOT Area:     4.15 cm        3D Volume EF                                LV 3D EF:    Left  ventricul LV Volumes (MOD)                            ar LV vol d, MOD    119.0 ml                   ejection A2C:                                        fraction LV vol d, MOD    113.5 ml                   by 3D A4C:                                        volume is LV vol s, MOD    65.1 ml                    44 %. A2C: LV vol s, MOD    69.5 ml A4C:                           3D Volume EF: LV SV MOD A2C:   53.9 ml       3D EF:        44 % LV SV MOD A4C:   113.5 ml      LV EDV:       155 ml LV SV MOD BP:    51.1 ml       LV ESV:       87 ml                                LV SV:        67 ml  RIGHT VENTRICLE             IVC RV S prime:     13.20 cm/s  IVC diam: 1.70 cm TAPSE (M-mode): 2.0 cm  LEFT ATRIUM           Index        RIGHT ATRIUM           Index LA diam:      3.20 cm 1.57 cm/m   RA Area:     11.70 cm LA Vol (A2C): 55.8 ml 27.34  ml/m  RA Volume:   25.70 ml  12.59 ml/m LA Vol (A4C): 31.1 ml 15.24 ml/m  AORTIC VALVE             PULMONIC VALVE LVOT Vmax:   93.60 cm/s  PR End Diast Vel: 1.26 msec LVOT Vmean:  63.300 cm/s LVOT VTI:    0.173 m   AORTA Ao Root diam: 3.20 cm Ao Asc diam:  3.20 cm  MITRAL VALVE MV Area (PHT): 4.86 cm     SHUNTS MV Decel Time: 156 msec     Systemic VTI:  0.17 m MV E velocity: 87.53 cm/s   Systemic Diam: 2.30 cm MV A velocity: 113.33 cm/s MV E/A ratio:  0.77  Riley Lam MD Electronically signed by Riley Lam MD Signature Date/Time: 06/29/2022/2:23:25 PM      Final  Assessment & Plan:    See Problem List for Assessment and Plan of chronic medical problems.

## 2022-07-06 NOTE — Patient Instructions (Addendum)
        Medications changes include :   jardiance 10 mg daily        Return for follow up as scheduled.

## 2022-07-07 ENCOUNTER — Ambulatory Visit (INDEPENDENT_AMBULATORY_CARE_PROVIDER_SITE_OTHER): Payer: Medicare Other | Admitting: Internal Medicine

## 2022-07-07 VITALS — BP 128/84 | HR 85 | Temp 97.8°F | Ht 65.0 in | Wt 210.1 lb

## 2022-07-07 DIAGNOSIS — J101 Influenza due to other identified influenza virus with other respiratory manifestations: Secondary | ICD-10-CM

## 2022-07-07 DIAGNOSIS — I5042 Chronic combined systolic (congestive) and diastolic (congestive) heart failure: Secondary | ICD-10-CM

## 2022-07-07 DIAGNOSIS — N1832 Chronic kidney disease, stage 3b: Secondary | ICD-10-CM | POA: Diagnosis not present

## 2022-07-07 DIAGNOSIS — I10 Essential (primary) hypertension: Secondary | ICD-10-CM | POA: Diagnosis not present

## 2022-07-07 DIAGNOSIS — J441 Chronic obstructive pulmonary disease with (acute) exacerbation: Secondary | ICD-10-CM

## 2022-07-07 DIAGNOSIS — R0603 Acute respiratory distress: Secondary | ICD-10-CM | POA: Diagnosis not present

## 2022-07-07 DIAGNOSIS — E785 Hyperlipidemia, unspecified: Secondary | ICD-10-CM | POA: Diagnosis not present

## 2022-07-07 MED ORDER — EMPAGLIFLOZIN 10 MG PO TABS
10.0000 mg | ORAL_TABLET | Freq: Every day | ORAL | 5 refills | Status: DC
Start: 2022-07-07 — End: 2023-01-06

## 2022-07-07 NOTE — Assessment & Plan Note (Signed)
Chronic Continue atorvastatin 40 mg daily 

## 2022-07-07 NOTE — Assessment & Plan Note (Signed)
Chronic Stable We will start Jardiance 10 mg daily

## 2022-07-07 NOTE — Assessment & Plan Note (Addendum)
Chronic Most recent echo with EF 45%, grade 1 DD Euvolemic today-no edema and shortness of breath is at her baseline and likely related to her COPD Continue Lasix 20 mg daily as needed, Coreg 18.75 mg twice daily Start Jardiance 10 mg daily

## 2022-07-07 NOTE — Assessment & Plan Note (Addendum)
Recent hospitalization with acute respiratory distress secondary to influenza A and COPD exacerbation Resolved Oxygen saturation here on room air is 95% Has chronic shortness of breath and her shortness of breath is at her baseline Seldom dry cough, no wheeze Continue home inhalers

## 2022-07-07 NOTE — Assessment & Plan Note (Addendum)
Recent hospitalization with acute respiratory failure and COPD exacerbation Completed Tamiflu Resolved

## 2022-07-07 NOTE — Assessment & Plan Note (Addendum)
Acute respiratory failure secondary to influenza A and COPD exacerbation Completed Tamiflu and prednisone taper She is using her inhalers as prescribed-Trelegy once a day and albuterol every 6 hours as needed Abnormal CT of the chest-seen pulmonary next week at [facility] Oxygen saturation on room air 95% Cellulitis dry cough, no wheeze

## 2022-07-07 NOTE — Assessment & Plan Note (Signed)
Chronic Blood pressure well controlled Continue Coreg 18.75 mg twice daily, lisinopril-HCTZ 20-25 mg daily

## 2022-07-14 DIAGNOSIS — R918 Other nonspecific abnormal finding of lung field: Secondary | ICD-10-CM | POA: Diagnosis not present

## 2022-07-14 DIAGNOSIS — J449 Chronic obstructive pulmonary disease, unspecified: Secondary | ICD-10-CM | POA: Diagnosis not present

## 2022-07-14 NOTE — Progress Notes (Signed)
 Pulmonary Return Visit Note:   Problem List: - COPD - Combined Systolic and Diastolic CHF - Prior tobacco abuse (quit 2009) - Morbid Obesity (BMI 41)  CC: routine COPD follow up   SUBJECTIVE:  April Combs is a pleasant 77 y.o. female who I saw in initial consultation on 09/19/2017 for dyspnea on exertion and untreated COPD.  She had previously avoided filling any inhalers because of fear of cost. After meeting with our financial counselor she was able to confirm her cost for Trelegy was only $40 per month which she said she could do.  She started noticing benefits from her inhaler almost immediately and has not had any exacerbations while on Trelegy and has had significant improvement in her symptoms.   Interval History:   Since her last visit she has done well and denies any respiratory limitations.  No exacerbations, no prednisone  and no Abx in the last year.  Unfortunately she lost her husband last June who suffered from a number of medical issues but she reports a good social network of family around herself.  She notes continued improvement in her exertional capacity.   Denies AECOPD requiring urgent care, ER unscheduled MD visit, abx or prednisone .   Uses her rescue inhaler usually about once at night.   Doing much more activity than she used to be able to do.    Current respiratory medications: Trelegy 1 puff daily, Albuterol  prn   - Receives Trelegy through patient assistance program partially when she goes into donut hole  Wt Readings from Last 3 Encounters:  07/14/22 95.3 kg (210 lb)  05/24/19 (!) 115 kg (253 lb 9.6 oz)  05/08/18 (!) 119.6 kg (263 lb 10.7 oz)    Smoking Hx: Started smoking as a teenager, 1.5ppd-2ppd, periodically quit, longest 14 years, quit in 2009.  Interval History - 07/14/2022  She came down with flu in April and was hospitalized for this  Hospital Course: 77 y.o. WF PMHx  HTN, HLD, CAD, CHF last EF 45% with grade 1 diastolic dysfunction, emphysema,  and hypothyroidism Presents with complaints of shortness of breath which started one day prior to admission.   Chest x-ray noted mild bibasilar interstitial markings or pronounced on the left concerning for atelectasis versus developing infiltrate.  Patient has been treated with Lasix  40 mg IV, Solu-Medrol  125 mg IV, DuoNeb breathing treatment, aspirin , Rocephin , and azithromycin .   Influenza screening came back positive and she had been started on Tamiflu .      She feels almost back to her baseline.  She was using her nebulized albuterol .  Continues on Trelegy 100 1 puff daily.  She uses lasix  prn for volume management.   Currently denies any cough, wheeze, chest tightness or shortness of breath.  Denies fevers, chills sweats or weight loss.   Most concerned about imaging she has had done locally which has shown tree in bud opacities, first noted in November and persistent on March 2024 study. Images are not available for review.  Next scan is scheduled is in September.       COPD Assessment Test Score  PRIOR Score  Cough 0   Phlegm 0   Chest Tightness 0   Dyspnea on walking 4   Activity Limitation 0   Confidence 0   Sleep quality 0   Energy level 3   Total 7   Score of over 10 suggests significant symptoms. A change in 2 or more suggests a change in health status.   Allergies:  No Known  Allergies  Current Medications (including changes made at this visit) Current Outpatient Medications  Medication Sig Dispense Refill  . allopurinol  (ZYLOPRIM ) 100 MG tablet Take 200 mg by mouth once daily  11  . atorvastatin  (LIPITOR) 20 MG tablet Take 20 mg by mouth once daily    . carvedilol  (COREG ) 12.5 MG tablet Take by mouth as directed 2 tablets in the AM, and 1 tablet in the PM.    . empagliflozin  (JARDIANCE ) 10 mg tablet Take 10 mg by mouth every morning before breakfast    . FUROsemide  (LASIX ) 20 MG tablet Take 1 tablet by mouth as directed  4  . inhalational spacer (AEROCHAMBER) spacer  Use as instructed. 1 each 0  . lisinopril -hydrochlorothiazide  (PRINZIDE ,ZESTORETIC ) 20-25 mg tablet Take 1 tablet by mouth once daily  3  . PROVENTIL  HFA 90 mcg/actuation inhaler INHALE TWO PUFFS BY MOUTH EVERY 6 HOURS AS NEEDED 1 each 2  . ranitidine  (ZANTAC ) 300 MG tablet Take 1 tablet by mouth nightly as needed      . fluticasone -umeclidinium-vilanterol (TRELEGY ELLIPTA ) 100-62.5-25 mcg inhaler Inhale 1 inhalation into the lungs once daily 1 Disk 11  . nitroGLYcerin  (NITROSTAT ) 0.4 MG SL tablet Place 0.4 mg under the tongue as needed (Patient not taking: Reported on 07/14/2022)  6   No current facility-administered medications for this visit.     OBJECTIVE: Vitals:   07/14/22 1017  BP: (!) 149/74  Pulse: 67  Resp: 18  SpO2: 98%  Weight: 95.3 kg (210 lb)  Height: 163.8 cm (5' 4.49)   Body mass index is 35.5 kg/m. 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 Innumerable clustered tree-in-bud nodules and patchy linear  consolidations, not significantly changed when compared with the  prior exam, likely due to aspiration or atypical infection.  Lung-RADS 3, probably benign findings. Short-term follow-up in 6  months is recommended with repeat low-dose chest CT without contrast  (please use the following order, CT CHEST LCS NODULE FOLLOW-UP W/O  CM).  2. Aortic Atherosclerosis (ICD10-I70.0) and Emphysema (ICD10-J43.9).   Pulmonary Function Testing (05/24/2019): Personally interpreted. Moderate airflow obstruction which has improved from prior.  Reviewed results with patient.       Latest Ref  Rng & Units 09/19/2017   12:47 AM 05/24/2019   11:46 AM  Pulmonary Function Test (PFT)  FVC Pre L 1.83  2.22   FVC_%PRED % 62.6  79 %   FVC_Z-SCORE   -1.24    -1.24   FEV1 Pre L 1.11  1.44   FEV1_%PRED % 49.9  66 %   FEV1/FVC Pre % 60.47  64.79   FEV1_Z-SCORE   -1.97    -1.97   FEF25-75% Pre L/s 0.54  0.74   FEF25-75%_%PRED % 28  41 %   FEF25-75%_Z-SCORE   -1.79    -1.79   TLC Pre L 6.14    TLC_%PRED % 119.5    RV Pre L 4.22    RV_%PRED % 188.6    DLCO Pre ml/(min*mmHg) 14.84    DLCOSINGLEBREATH_%PRED % 83.7      ASSESSMENT: - COPD, well controlled on triple inhaled therapy  -  Prior tobacco abuse, quit 2009 - Morbid obesity - Systolic and Diastolic CHF (EF 54% - Pulmonary nodules   Discussion: I haven't seen in her in about 3 years but comes in today to re-establish care, primarily due to concerns over nodules found on recent CT chest imaging.  She came down with flu last month but has nearly completely recovered and outside of this hasn't had any flare ups of her COPD.   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  She is completely asymptomatic at this time from that perspective in that she denies any chronic bronchitis symptoms, no constitutional symptoms.  I will request images to be uploaded to our system and will follow up with her after her next scan is completed.    PLAN: - Will request outside images - Continue Trelegy 100 1 inhalation daily  - Continue Albuterol  prn - RTC in ~5 months to review CT scan  Diagnoses and all orders for this visit:  Chronic obstructive pulmonary disease, unspecified COPD type (CMS/HHS-HCC)  Pulmonary nodules     Attestation Statement:   I personally performed the service. (TP)  AMBER HART DARBY, MD  I spent a total of 40 minutes in both face-to-face and non-face-to-face activities, excluding procedures performed, for this visit  on the date of this encounter.

## 2022-07-23 ENCOUNTER — Other Ambulatory Visit: Payer: Self-pay | Admitting: Cardiology

## 2022-07-23 DIAGNOSIS — I10 Essential (primary) hypertension: Secondary | ICD-10-CM

## 2022-08-24 DIAGNOSIS — E559 Vitamin D deficiency, unspecified: Secondary | ICD-10-CM | POA: Insufficient documentation

## 2022-08-24 NOTE — Patient Instructions (Addendum)
      Blood work was ordered.   The lab is on the first floor.    Medications changes include :       A referral was ordered and someone will call you to schedule an appointment.     Return in about 6 months (around 02/24/2023) for follow up.  

## 2022-08-24 NOTE — Progress Notes (Unsigned)
Subjective:    Patient ID: April Combs, female    DOB: [birth year], 77 y.o.   MRN: [id number]     HPI April Combs is here for follow up of her chronic medical problems.  Tickle in throat at night > day.  PND -   Medications and allergies reviewed with patient and updated if appropriate.  Current Outpatient Medications on File Prior to Visit  Medication Sig Dispense Refill   acetaminophen (TYLENOL) 325 MG tablet Take 2 tablets (650 mg total) by mouth every 6 (six) hours as needed for mild pain (or Fever >/= 101).     albuterol (PROVENTIL) (2.5 MG/3ML) 0.083% nebulizer solution USE 1 VIAL IN NEBULIZER EVERY 6 HOURS AS NEEDED FOR WHEEZING FOR SHORTNESS OF BREATH (Patient taking differently: Take 2.5 mg by nebulization in the morning and at bedtime.) 150 mL 0   albuterol (VENTOLIN HFA) 108 (90 Base) MCG/ACT inhaler Inhale 2 puffs into the lungs every 6 (six) hours as needed for wheezing or shortness of breath. 18 g 0   aspirin EC 81 MG tablet Take 1 tablet (81 mg total) by mouth daily. Swallow whole. 30 tablet 12   atorvastatin (LIPITOR) 40 MG tablet Take 1 tablet (40 mg total) by mouth daily. 30 tablet 1   carvedilol (COREG) 12.5 MG tablet TAKE 1 AND 1/2 TABLETS BY MOUTH  TWICE DAILY WITH A MEAL (Patient taking differently: Take 18.75 mg by mouth 2 (two) times daily with a meal.) 270 tablet 3   empagliflozin (JARDIANCE) 10 MG TABS tablet Take 1 tablet (10 mg total) by mouth daily before breakfast. 30 tablet 5   Fluticasone-Umeclidin-Vilant (TRELEGY ELLIPTA) 100-62.5-25 MCG/ACT AEPB Inhale 1 puff into the lungs daily. Plans to start bevespi after completing rx for trelegy     furosemide (LASIX) 20 MG tablet Take 1 tablet (20 mg total) by mouth daily as needed. (Patient taking differently: Take 20 mg by mouth daily as needed for fluid.) 45 tablet 3   guaiFENesin (MUCINEX) 600 MG 12 hr tablet Take 1 tablet (600 mg total) by mouth 2 (two) times daily.     levothyroxine (SYNTHROID) 50 MCG  tablet Take 1 tablet by mouth once daily (Patient taking differently: Take 50 mcg by mouth daily.) 90 tablet 0   lisinopril-hydrochlorothiazide (ZESTORETIC) 20-25 MG tablet TAKE 1 TABLET BY MOUTH DAILY 90 tablet 3   No current facility-administered medications on file prior to visit.     Review of Systems  Constitutional:  Negative for fever.  HENT:  Positive for postnasal drip. Negative for congestion.   Respiratory:  Positive for cough and shortness of breath (chronic - varies  - longer to go up stairs). Negative for chest tightness and wheezing.   Cardiovascular:  Negative for chest pain, palpitations and leg swelling.  Neurological:  Positive for headaches. Negative for dizziness and light-headedness.       Objective:   Vitals:   08/25/22 0746  BP: 114/74  Pulse: 67  Temp: 98 F (36.7 C)  SpO2: 95%   BP Readings from Last 3 Encounters:  08/25/22 114/74  07/07/22 128/84  06/30/22 139/81   Wt Readings from Last 3 Encounters:  08/25/22 212 lb 6.4 oz (96.3 kg)  07/07/22 210 lb 2 oz (95.3 kg)  06/27/22 215 lb 4.8 oz (97.7 kg)   Body mass index is 35.35 kg/m.    Physical Exam Constitutional:      General: She is not in acute distress.    Appearance: Normal  appearance.  HENT:     Head: Normocephalic and atraumatic.  Eyes:     Conjunctiva/sclera: Conjunctivae normal.  Cardiovascular:     Rate and Rhythm: Normal rate and regular rhythm.     Heart sounds: Normal heart sounds.  Pulmonary:     Effort: Pulmonary effort is normal. No respiratory distress.     Breath sounds: Normal breath sounds. No wheezing.  Musculoskeletal:     Cervical back: Neck supple.     Right lower leg: No edema.     Left lower leg: No edema.  Lymphadenopathy:     Cervical: No cervical adenopathy.  Skin:    General: Skin is warm and dry.     Findings: No rash.  Neurological:     Mental Status: She is alert. Mental status is at baseline.  Psychiatric:        Mood and Affect: Mood normal.         Behavior: Behavior normal.        Lab Results  Component Value Date   WBC 9.6 06/28/2022   HGB 11.0 (L) 06/28/2022   HCT 33.2 (L) 06/28/2022   PLT 145 (L) 06/28/2022   GLUCOSE 136 (H) 06/30/2022   CHOL 162 06/29/2022   TRIG 100 06/29/2022   HDL 68 06/29/2022   LDLDIRECT 74.0 08/20/2020   LDLCALC 74 06/29/2022   ALT 21 06/27/2022   AST 27 06/27/2022   NA 136 06/30/2022   K 3.4 (L) 06/30/2022   CL 101 06/30/2022   CREATININE 1.01 (H) 06/30/2022   BUN 38 (H) 06/30/2022   CO2 23 06/30/2022   TSH 1.368 06/27/2022   INR 1.08 01/28/2014   HGBA1C 6.0 02/19/2022   MICROALBUR <0.7 08/20/2021     Assessment & Plan:    See Problem List for Assessment and Plan of chronic medical problems.

## 2022-08-25 ENCOUNTER — Ambulatory Visit (INDEPENDENT_AMBULATORY_CARE_PROVIDER_SITE_OTHER): Payer: Medicare Other | Admitting: Internal Medicine

## 2022-08-25 ENCOUNTER — Encounter: Payer: Self-pay | Admitting: Internal Medicine

## 2022-08-25 VITALS — BP 114/74 | HR 67 | Temp 98.0°F | Ht 65.0 in | Wt 212.4 lb

## 2022-08-25 DIAGNOSIS — E785 Hyperlipidemia, unspecified: Secondary | ICD-10-CM

## 2022-08-25 DIAGNOSIS — I10 Essential (primary) hypertension: Secondary | ICD-10-CM

## 2022-08-25 DIAGNOSIS — E559 Vitamin D deficiency, unspecified: Secondary | ICD-10-CM | POA: Diagnosis not present

## 2022-08-25 DIAGNOSIS — E039 Hypothyroidism, unspecified: Secondary | ICD-10-CM | POA: Diagnosis not present

## 2022-08-25 DIAGNOSIS — I5042 Chronic combined systolic (congestive) and diastolic (congestive) heart failure: Secondary | ICD-10-CM

## 2022-08-25 DIAGNOSIS — M5416 Radiculopathy, lumbar region: Secondary | ICD-10-CM | POA: Diagnosis not present

## 2022-08-25 DIAGNOSIS — N1832 Chronic kidney disease, stage 3b: Secondary | ICD-10-CM

## 2022-08-25 DIAGNOSIS — M109 Gout, unspecified: Secondary | ICD-10-CM | POA: Diagnosis not present

## 2022-08-25 DIAGNOSIS — Z7984 Long term (current) use of oral hypoglycemic drugs: Secondary | ICD-10-CM

## 2022-08-25 DIAGNOSIS — R0982 Postnasal drip: Secondary | ICD-10-CM | POA: Diagnosis not present

## 2022-08-25 DIAGNOSIS — E1169 Type 2 diabetes mellitus with other specified complication: Secondary | ICD-10-CM | POA: Diagnosis not present

## 2022-08-25 DIAGNOSIS — K219 Gastro-esophageal reflux disease without esophagitis: Secondary | ICD-10-CM

## 2022-08-25 LAB — LIPID PANEL
Cholesterol: 161 mg/dL (ref 0–200)
HDL: 66.8 mg/dL (ref >39.00)
LDL Cholesterol: 65 mg/dL (ref 0–99)
NonHDL: 94.09
Total CHOL/HDL Ratio: 2
Triglycerides: 146 mg/dL (ref 0.0–149.0)
VLDL: 29.2 mg/dL (ref 0.0–40.0)

## 2022-08-25 LAB — COMPREHENSIVE METABOLIC PANEL
ALT: 14 U/L (ref 0–35)
AST: 19 U/L (ref 0–37)
Albumin: 4.3 g/dL (ref 3.5–5.2)
Alkaline Phosphatase: 61 U/L (ref 39–117)
BUN: 39 mg/dL — ABNORMAL HIGH (ref 6–23)
CO2: 23 mEq/L (ref 19–32)
Calcium: 9.2 mg/dL (ref 8.4–10.5)
Chloride: 109 mEq/L (ref 96–112)
Creatinine, Ser: 1.04 mg/dL (ref 0.40–1.20)
GFR: 52.39 mL/min — ABNORMAL LOW (ref >60.00)
Glucose, Bld: 96 mg/dL (ref 70–99)
Potassium: 4.1 mEq/L (ref 3.5–5.1)
Sodium: 142 mEq/L (ref 135–145)
Total Bilirubin: 0.3 mg/dL (ref 0.2–1.2)
Total Protein: 7.1 g/dL (ref 6.0–8.3)

## 2022-08-25 LAB — TSH: TSH: 4.58 u[IU]/mL (ref 0.35–5.50)

## 2022-08-25 LAB — CBC WITH DIFFERENTIAL/PLATELET
Basophils Absolute: 0.1 10*3/uL (ref 0.0–0.1)
Basophils Relative: 1.1 % (ref 0.0–3.0)
Eosinophils Absolute: 0.2 10*3/uL (ref 0.0–0.7)
Eosinophils Relative: 3.8 % (ref 0.0–5.0)
HCT: 37.5 % (ref 36.0–46.0)
Hemoglobin: 12 g/dL (ref 12.0–15.0)
Lymphocytes Relative: 24 % (ref 12.0–46.0)
Lymphs Abs: 1.6 10*3/uL (ref 0.7–4.0)
MCHC: 32 g/dL (ref 30.0–36.0)
MCV: 102.1 fl — ABNORMAL HIGH (ref 78.0–100.0)
Monocytes Absolute: 0.6 10*3/uL (ref 0.1–1.0)
Monocytes Relative: 8.6 % (ref 3.0–12.0)
Neutro Abs: 4.1 10*3/uL (ref 1.4–7.7)
Neutrophils Relative %: 62.5 % (ref 43.0–77.0)
Platelets: 163 10*3/uL (ref 150.0–400.0)
RBC: 3.68 Mil/uL — ABNORMAL LOW (ref 3.87–5.11)
RDW: 15.9 % — ABNORMAL HIGH (ref 11.5–15.5)
WBC: 6.6 10*3/uL (ref 4.0–10.5)

## 2022-08-25 LAB — MICROALBUMIN / CREATININE URINE RATIO
Creatinine,U: 55 mg/dL
Microalb Creat Ratio: 1.6 mg/g (ref 0.0–30.0)
Microalb, Ur: 0.9 mg/dL (ref 0.0–1.9)

## 2022-08-25 LAB — VITAMIN D 25 HYDROXY (VIT D DEFICIENCY, FRACTURES): VITD: 32.18 ng/mL (ref 30.00–100.00)

## 2022-08-25 LAB — HEMOGLOBIN A1C: Hgb A1c MFr Bld: 5.7 % (ref 4.6–6.5)

## 2022-08-25 MED ORDER — ALLOPURINOL 300 MG PO TABS: 300.0000 mg | ORAL_TABLET | Freq: Every day | ORAL | 3 refills | Status: DC

## 2022-08-25 MED ORDER — OMEPRAZOLE 20 MG PO CPDR: 20.0000 mg | DELAYED_RELEASE_CAPSULE | Freq: Every day | ORAL | 3 refills | Status: AC

## 2022-08-25 MED ORDER — GABAPENTIN 100 MG PO CAPS: ORAL_CAPSULE | ORAL | 3 refills | Status: DC

## 2022-08-25 MED ORDER — OMEPRAZOLE 20 MG PO CPDR: 20.0000 mg | DELAYED_RELEASE_CAPSULE | Freq: Every day | ORAL | 3 refills | Status: DC

## 2022-08-25 MED ORDER — ALBUTEROL SULFATE HFA 108 (90 BASE) MCG/ACT IN AERS: 2.0000 | INHALATION_SPRAY | Freq: Four times a day (QID) | RESPIRATORY_TRACT | 3 refills | Status: AC | PRN

## 2022-08-25 NOTE — Assessment & Plan Note (Signed)
Chronic Taking vitamin D daily Check vitamin D level  

## 2022-08-25 NOTE — Assessment & Plan Note (Signed)
Chronic No recent gout flares Continue allopurinol 300 mg daily 

## 2022-08-25 NOTE — Assessment & Plan Note (Signed)
Chronic Stable CMP, CBC We will start Jardiance 10 mg daily

## 2022-08-25 NOTE — Assessment & Plan Note (Signed)
Chronic Encouraged weight loss Encouraged decrease portions, diabetic diet Limited in her ability to be active by chronic pain

## 2022-08-25 NOTE — Assessment & Plan Note (Signed)
Chronic   Lab Results  Component Value Date   HGBA1C 6.0 02/19/2022   Sugars well controlled Check A1c, urine microalbumin Continue Jardiance 10 mg daily Stressed regular exercise, diabetic diet

## 2022-08-25 NOTE — Assessment & Plan Note (Signed)
Chronic  Clinically euthyroid Check tsh and will titrate med dose if needed Currently taking levothyroxine 50 mcg daily 

## 2022-08-25 NOTE — Assessment & Plan Note (Signed)
Chronic Blood pressure well controlled CMP Continue Coreg 18.75 mg twice daily, lisinopril-HCTZ 20-25 mg daily 

## 2022-08-25 NOTE — Addendum Note (Signed)
Addended by: Karma Ganja on: 08/25/2022 09:36 AM   Modules accepted: Orders

## 2022-08-25 NOTE — Assessment & Plan Note (Addendum)
Chronic Not controlled Continue gabapentin 100 mg in the morning Increase gabapentin to 300 mg at bedtime

## 2022-08-25 NOTE — Assessment & Plan Note (Signed)
Chronic Echo 06/2022 with EF 40-45%, global LV hypokinesis, diastolic dysfunction indeterminate, but previous echo showed grade 1 DD Euvolemic today-no edema and shortness of breath is at her baseline and likely related to her COPD Continue Lasix 20 mg daily as needed, Coreg 18.75 mg twice daily, Jardiance 10 mg daily

## 2022-08-25 NOTE — Assessment & Plan Note (Addendum)
Acute Causes cough Can try otc anti-histamine, flonase

## 2022-08-25 NOTE — Assessment & Plan Note (Signed)
Chronic Healthy diet encouraged Check lipid panel Continue atorvastatin 40 mg daily

## 2022-08-25 NOTE — Assessment & Plan Note (Signed)
Chronic GERD controlled Continue omeprazole 20 mg daily  

## 2022-09-11 ENCOUNTER — Other Ambulatory Visit: Payer: Self-pay | Admitting: Internal Medicine

## 2022-11-01 ENCOUNTER — Encounter: Payer: Self-pay | Admitting: Internal Medicine

## 2022-11-01 NOTE — Progress Notes (Unsigned)
Subjective:    Patient ID: April Combs, female    DOB: Nov 24, 1945, 77 y.o.   MRN: [id number]      HPI April Combs is here for No chief complaint on file.    Pain in legs, weakness in legs -    Last MRI lumbar spine 08/2019 -  Medications and allergies reviewed with patient and updated if appropriate.  Current Outpatient Medications on File Prior to Visit  Medication Sig Dispense Refill   acetaminophen (TYLENOL) 325 MG tablet Take 2 tablets (650 mg total) by mouth every 6 (six) hours as needed for mild pain (or Fever >/= 101).     albuterol (PROVENTIL) (2.5 MG/3ML) 0.083% nebulizer solution USE 1 VIAL IN NEBULIZER EVERY 6 HOURS AS NEEDED FOR WHEEZING FOR SHORTNESS OF BREATH (Patient taking differently: Take 2.5 mg by nebulization in the morning and at bedtime.) 150 mL 0   albuterol (VENTOLIN HFA) 108 (90 Base) MCG/ACT inhaler Inhale 2 puffs into the lungs every 6 (six) hours as needed for wheezing or shortness of breath. 18 g 3   allopurinol (ZYLOPRIM) 300 MG tablet Take 1 tablet (300 mg total) by mouth daily. 90 tablet 3   aspirin EC 81 MG tablet Take 1 tablet (81 mg total) by mouth daily. Swallow whole. 30 tablet 12   atorvastatin (LIPITOR) 40 MG tablet Take 1 tablet (40 mg total) by mouth daily. 30 tablet 1   carvedilol (COREG) 12.5 MG tablet TAKE 1 AND 1/2 TABLETS BY MOUTH  TWICE DAILY WITH A MEAL (Patient taking differently: Take 18.75 mg by mouth 2 (two) times daily with a meal.) 270 tablet 3   empagliflozin (JARDIANCE) 10 MG TABS tablet Take 1 tablet (10 mg total) by mouth daily before breakfast. 30 tablet 5   Fluticasone-Umeclidin-Vilant (TRELEGY ELLIPTA) 100-62.5-25 MCG/ACT AEPB Inhale 1 puff into the lungs daily. Plans to start bevespi after completing rx for trelegy     furosemide (LASIX) 20 MG tablet Take 1 tablet (20 mg total) by mouth daily as needed. (Patient taking differently: Take 20 mg by mouth daily as needed for fluid.) 45 tablet 3   gabapentin (NEURONTIN) 100 MG  capsule TAKE 1 CAPSULE BY MOUTH IN  THE MORNING AND 3 CAPSULES  BY MOUTH AT BEDTIME 360 capsule 3   guaiFENesin (MUCINEX) 600 MG 12 hr tablet Take 1 tablet (600 mg total) by mouth 2 (two) times daily.     levothyroxine (SYNTHROID) 50 MCG tablet Take 1 tablet by mouth once daily 90 tablet 0   lisinopril-hydrochlorothiazide (ZESTORETIC) 20-25 MG tablet TAKE 1 TABLET BY MOUTH DAILY 90 tablet 3   omeprazole (PRILOSEC) 20 MG capsule Take 1 capsule (20 mg total) by mouth daily. TAKE 1 CAPSULE BY MOUTH  TWICE DAILY BEFORE A MEAL 90 capsule 3   No current facility-administered medications on file prior to visit.    Review of Systems     Objective:  There were no vitals filed for this visit. BP Readings from Last 3 Encounters:  08/25/22 114/74  07/07/22 128/84  06/30/22 139/81   Wt Readings from Last 3 Encounters:  08/25/22 212 lb 6.4 oz (96.3 kg)  07/07/22 210 lb 2 oz (95.3 kg)  06/27/22 215 lb 4.8 oz (97.7 kg)   There is no height or weight on file to calculate BMI.    Physical Exam         Assessment & Plan:    See Problem List for Assessment and Plan of chronic medical problems.

## 2022-11-02 ENCOUNTER — Ambulatory Visit (INDEPENDENT_AMBULATORY_CARE_PROVIDER_SITE_OTHER): Payer: Medicare Other | Admitting: Internal Medicine

## 2022-11-02 ENCOUNTER — Ambulatory Visit (INDEPENDENT_AMBULATORY_CARE_PROVIDER_SITE_OTHER): Payer: Medicare Other

## 2022-11-02 VITALS — BP 130/70 | HR 69 | Temp 98.0°F | Ht 65.0 in | Wt 214.0 lb

## 2022-11-02 DIAGNOSIS — M79604 Pain in right leg: Secondary | ICD-10-CM

## 2022-11-02 DIAGNOSIS — M79605 Pain in left leg: Secondary | ICD-10-CM

## 2022-11-02 DIAGNOSIS — M5136 Other intervertebral disc degeneration, lumbar region: Secondary | ICD-10-CM | POA: Diagnosis not present

## 2022-11-02 DIAGNOSIS — M545 Low back pain, unspecified: Secondary | ICD-10-CM | POA: Diagnosis not present

## 2022-11-02 DIAGNOSIS — M47816 Spondylosis without myelopathy or radiculopathy, lumbar region: Secondary | ICD-10-CM | POA: Diagnosis not present

## 2022-11-02 DIAGNOSIS — R29898 Other symptoms and signs involving the musculoskeletal system: Secondary | ICD-10-CM | POA: Diagnosis not present

## 2022-11-02 MED ORDER — GABAPENTIN 100 MG PO CAPS: ORAL_CAPSULE | ORAL | 3 refills | Status: DC

## 2022-11-02 NOTE — Patient Instructions (Addendum)
      Have an xray downstairs.      Medications changes include :   increase gabapentin to 200 mg in morning and continue the 300 mg at bedtime.     A referral was ordered sports medicine downstairs and someone will call you to schedule an appointment.     Return if symptoms worsen or fail to improve.

## 2022-11-02 NOTE — Assessment & Plan Note (Signed)
Acute on chronic B/l whole leg pain  - constant Subjective weakness - not noted on exam Tingling in toes, legs are stiff at night Can not stand long, leaning forward gives some relief Concern for spinal stenosis Xray today Referral to sports medicine Increase gabapentin to 200 mg in am, 300 mg in pm - can increase further if needed Continue tylenol

## 2022-11-07 ENCOUNTER — Other Ambulatory Visit: Payer: Self-pay | Admitting: Internal Medicine

## 2022-11-08 NOTE — Progress Notes (Unsigned)
   Rubin Payor, PhD, LAT, ATC acting as a scribe for Clementeen Graham, MD.  Mekiah Hurley is a 77 y.o. female who presents to [facility] at [facility] today for bilat leg pain x ***. Pt locates pain to ***  Low back pain: Radiating pain: LE numbness/tingling: LE weakness: Aggravates: Treatments tried: Tylenol, Gabapentin,  Dx imaging: 11/02/22 L-spine XR  08/16/19 L-spine MRI  Pertinent review of systems: ***  Relevant historical information: ***   Exam:  There were no vitals taken for this visit. General: Well Developed, well nourished, and in no acute distress.   MSK: ***    Lab and Radiology Results No results found for this or any previous visit (from the past 72 hour(s)). No results found.     Assessment and Plan: 77 y.o. female with ***   PDMP not reviewed this encounter. No orders of the defined types were placed in this encounter.  No orders of the defined types were placed in this encounter.    Discussed warning signs or symptoms. Please see discharge instructions. Patient expresses understanding.   ***

## 2022-11-09 ENCOUNTER — Ambulatory Visit (INDEPENDENT_AMBULATORY_CARE_PROVIDER_SITE_OTHER): Payer: Medicare Other | Admitting: Family Medicine

## 2022-11-09 ENCOUNTER — Encounter: Payer: Self-pay | Admitting: Family Medicine

## 2022-11-09 VITALS — BP 124/72 | HR 62 | Ht 65.0 in | Wt 214.0 lb

## 2022-11-09 DIAGNOSIS — M79605 Pain in left leg: Secondary | ICD-10-CM | POA: Diagnosis not present

## 2022-11-09 DIAGNOSIS — R29898 Other symptoms and signs involving the musculoskeletal system: Secondary | ICD-10-CM | POA: Diagnosis not present

## 2022-11-09 DIAGNOSIS — M79604 Pain in right leg: Secondary | ICD-10-CM

## 2022-11-09 DIAGNOSIS — D508 Other iron deficiency anemias: Secondary | ICD-10-CM

## 2022-11-09 DIAGNOSIS — M5416 Radiculopathy, lumbar region: Secondary | ICD-10-CM | POA: Diagnosis not present

## 2022-11-09 LAB — CBC
HCT: 36.5 % (ref 36.0–46.0)
Hemoglobin: 11.9 g/dL — ABNORMAL LOW (ref 12.0–15.0)
MCHC: 32.7 g/dL (ref 30.0–36.0)
MCV: 101.2 fl — ABNORMAL HIGH (ref 78.0–100.0)
Platelets: 144 10*3/uL — ABNORMAL LOW (ref 150.0–400.0)
RBC: 3.61 Mil/uL — ABNORMAL LOW (ref 3.87–5.11)
RDW: 15 % (ref 11.5–15.5)
WBC: 4.7 10*3/uL (ref 4.0–10.5)

## 2022-11-09 NOTE — Patient Instructions (Addendum)
Thank you for coming in today.  You should hear from MRI scheduling within 1 week. If you do not hear please let me know.    Please get labs today before you leave   Recheck after the MRI or injection.

## 2022-11-10 ENCOUNTER — Telehealth: Payer: Self-pay | Admitting: Family Medicine

## 2022-11-10 DIAGNOSIS — M5416 Radiculopathy, lumbar region: Secondary | ICD-10-CM

## 2022-11-10 DIAGNOSIS — D508 Other iron deficiency anemias: Secondary | ICD-10-CM

## 2022-11-10 LAB — IRON,TIBC AND FERRITIN PANEL
%SAT: 20 % (calc) (ref 16–45)
Ferritin: 20 ng/mL (ref 16–288)
Iron: 84 ug/dL (ref 45–160)
TIBC: 426 ug/dL (ref 250–450)

## 2022-11-10 NOTE — Telephone Encounter (Signed)
Called pt and advised per Dr. Denyse Amass. Pt verbalized understanding. She will reach out if she does not hear from them by the end of the week.

## 2022-11-10 NOTE — Progress Notes (Signed)
You still have some anemia and your iron stores are little bit low.  I am going to talk to Dr. Lawerance Bach and we will come up with a good plan for next steps for this.

## 2022-11-10 NOTE — Telephone Encounter (Signed)
-----   Message from Pincus Sanes sent at 11/10/2022  7:18 AM EDT ----- Regarding: RE: Iron stores I think either is worth a try to see if it helps.  Thanks for seeing her. ----- Message ----- From: Rodolph Bong, MD Sent: 11/10/2022   6:25 AM EDT To: Pincus Sanes, MD Subject: Iron stores                                    Patient saw me for what I think is probably lumbar radiculopathy.  I noticed that you had checked iron stores about 2 months ago and her percent saturation was a bit low and her hemoglobin was a bit low.  Sometimes low iron stores can cause some neurologic symptoms.  I went ahead and rechecked the iron stores and CBC.  Her ferritin is lower than I think it should be and she still anemic.  I am considering oral iron or even referral to hematology for potential IV iron.  What do you think? ----- Message ----- From: Interface, [facility] Sent: 11/09/2022  12:07 PM EDT To: Rodolph Bong, MD

## 2022-11-10 NOTE — Telephone Encounter (Signed)
I spoke with Dr. Lawerance Bach.  We are going to refer you to hematology to consider IV iron infusion which might be very helpful for this iron deficiency anemia that you have.  You should hear from the hematology/oncology office soon.

## 2022-11-17 ENCOUNTER — Ambulatory Visit
Admission: RE | Admit: 2022-11-17 | Discharge: 2022-11-17 | Disposition: A | Discharge status: Home / Self Care | Payer: Medicare Other | Source: Ambulatory Visit | Attending: Acute Care | Admitting: Acute Care

## 2022-11-17 DIAGNOSIS — Z87891 Personal history of nicotine dependence: Secondary | ICD-10-CM | POA: Diagnosis not present

## 2022-11-17 DIAGNOSIS — J984 Other disorders of lung: Secondary | ICD-10-CM | POA: Diagnosis not present

## 2022-11-17 DIAGNOSIS — R918 Other nonspecific abnormal finding of lung field: Secondary | ICD-10-CM | POA: Diagnosis not present

## 2022-11-17 DIAGNOSIS — R911 Solitary pulmonary nodule: Secondary | ICD-10-CM

## 2022-11-17 DIAGNOSIS — I2510 Atherosclerotic heart disease of native coronary artery without angina pectoris: Secondary | ICD-10-CM | POA: Diagnosis not present

## 2022-11-21 ENCOUNTER — Ambulatory Visit
Admission: RE | Admit: 2022-11-21 | Discharge: 2022-11-21 | Disposition: A | Discharge status: Home / Self Care | Payer: Medicare Other | Source: Ambulatory Visit | Attending: Family Medicine | Admitting: Family Medicine

## 2022-11-21 DIAGNOSIS — M79604 Pain in right leg: Secondary | ICD-10-CM

## 2022-11-21 DIAGNOSIS — M5416 Radiculopathy, lumbar region: Secondary | ICD-10-CM | POA: Diagnosis not present

## 2022-12-03 ENCOUNTER — Other Ambulatory Visit: Payer: Self-pay

## 2022-12-03 DIAGNOSIS — D500 Iron deficiency anemia secondary to blood loss (chronic): Secondary | ICD-10-CM

## 2022-12-06 ENCOUNTER — Inpatient Hospital Stay: Payer: Medicare Other

## 2022-12-06 ENCOUNTER — Inpatient Hospital Stay: Payer: Medicare Other | Attending: Internal Medicine | Admitting: Internal Medicine

## 2022-12-06 VITALS — BP 123/51 | HR 76 | Temp 97.6°F | Resp 16 | Ht 65.0 in | Wt 208.9 lb

## 2022-12-06 DIAGNOSIS — Z9884 Bariatric surgery status: Secondary | ICD-10-CM | POA: Diagnosis not present

## 2022-12-06 DIAGNOSIS — J432 Centrilobular emphysema: Secondary | ICD-10-CM | POA: Insufficient documentation

## 2022-12-06 DIAGNOSIS — I2510 Atherosclerotic heart disease of native coronary artery without angina pectoris: Secondary | ICD-10-CM | POA: Diagnosis not present

## 2022-12-06 DIAGNOSIS — Z800 Family history of malignant neoplasm of digestive organs: Secondary | ICD-10-CM | POA: Insufficient documentation

## 2022-12-06 DIAGNOSIS — I110 Hypertensive heart disease with heart failure: Secondary | ICD-10-CM | POA: Insufficient documentation

## 2022-12-06 DIAGNOSIS — M79604 Pain in right leg: Secondary | ICD-10-CM | POA: Insufficient documentation

## 2022-12-06 DIAGNOSIS — M1990 Unspecified osteoarthritis, unspecified site: Secondary | ICD-10-CM

## 2022-12-06 DIAGNOSIS — F419 Anxiety disorder, unspecified: Secondary | ICD-10-CM | POA: Diagnosis not present

## 2022-12-06 DIAGNOSIS — D500 Iron deficiency anemia secondary to blood loss (chronic): Secondary | ICD-10-CM

## 2022-12-06 DIAGNOSIS — I5032 Chronic diastolic (congestive) heart failure: Secondary | ICD-10-CM | POA: Insufficient documentation

## 2022-12-06 DIAGNOSIS — F32A Depression, unspecified: Secondary | ICD-10-CM | POA: Diagnosis not present

## 2022-12-06 DIAGNOSIS — M47814 Spondylosis without myelopathy or radiculopathy, thoracic region: Secondary | ICD-10-CM | POA: Insufficient documentation

## 2022-12-06 DIAGNOSIS — E039 Hypothyroidism, unspecified: Secondary | ICD-10-CM | POA: Insufficient documentation

## 2022-12-06 DIAGNOSIS — Z87891 Personal history of nicotine dependence: Secondary | ICD-10-CM | POA: Insufficient documentation

## 2022-12-06 DIAGNOSIS — D649 Anemia, unspecified: Secondary | ICD-10-CM

## 2022-12-06 DIAGNOSIS — Z79899 Other long term (current) drug therapy: Secondary | ICD-10-CM | POA: Insufficient documentation

## 2022-12-06 DIAGNOSIS — Z8249 Family history of ischemic heart disease and other diseases of the circulatory system: Secondary | ICD-10-CM | POA: Insufficient documentation

## 2022-12-06 DIAGNOSIS — Z833 Family history of diabetes mellitus: Secondary | ICD-10-CM | POA: Insufficient documentation

## 2022-12-06 DIAGNOSIS — E781 Pure hyperglyceridemia: Secondary | ICD-10-CM | POA: Insufficient documentation

## 2022-12-06 DIAGNOSIS — Z8349 Family history of other endocrine, nutritional and metabolic diseases: Secondary | ICD-10-CM | POA: Insufficient documentation

## 2022-12-06 DIAGNOSIS — M79605 Pain in left leg: Secondary | ICD-10-CM | POA: Insufficient documentation

## 2022-12-06 DIAGNOSIS — I700 Atherosclerosis of aorta: Secondary | ICD-10-CM | POA: Diagnosis not present

## 2022-12-06 DIAGNOSIS — Z841 Family history of disorders of kidney and ureter: Secondary | ICD-10-CM | POA: Insufficient documentation

## 2022-12-06 DIAGNOSIS — Z8719 Personal history of other diseases of the digestive system: Secondary | ICD-10-CM | POA: Diagnosis not present

## 2022-12-06 DIAGNOSIS — G8929 Other chronic pain: Secondary | ICD-10-CM | POA: Insufficient documentation

## 2022-12-06 DIAGNOSIS — K219 Gastro-esophageal reflux disease without esophagitis: Secondary | ICD-10-CM | POA: Diagnosis not present

## 2022-12-06 DIAGNOSIS — I10 Essential (primary) hypertension: Secondary | ICD-10-CM

## 2022-12-06 DIAGNOSIS — R42 Dizziness and giddiness: Secondary | ICD-10-CM

## 2022-12-06 DIAGNOSIS — J479 Bronchiectasis, uncomplicated: Secondary | ICD-10-CM | POA: Insufficient documentation

## 2022-12-06 DIAGNOSIS — D509 Iron deficiency anemia, unspecified: Secondary | ICD-10-CM | POA: Insufficient documentation

## 2022-12-06 DIAGNOSIS — Z90710 Acquired absence of both cervix and uterus: Secondary | ICD-10-CM | POA: Insufficient documentation

## 2022-12-06 DIAGNOSIS — Z9049 Acquired absence of other specified parts of digestive tract: Secondary | ICD-10-CM | POA: Insufficient documentation

## 2022-12-06 DIAGNOSIS — Z8269 Family history of other diseases of the musculoskeletal system and connective tissue: Secondary | ICD-10-CM | POA: Insufficient documentation

## 2022-12-06 DIAGNOSIS — E119 Type 2 diabetes mellitus without complications: Secondary | ICD-10-CM

## 2022-12-06 DIAGNOSIS — Z803 Family history of malignant neoplasm of breast: Secondary | ICD-10-CM | POA: Insufficient documentation

## 2022-12-06 DIAGNOSIS — Z801 Family history of malignant neoplasm of trachea, bronchus and lung: Secondary | ICD-10-CM | POA: Insufficient documentation

## 2022-12-06 LAB — CBC WITH DIFFERENTIAL (CANCER CENTER ONLY)
Abs Immature Granulocytes: 0.04 10*3/uL (ref 0.00–0.07)
Basophils Absolute: 0.1 10*3/uL (ref 0.0–0.1)
Basophils Relative: 1 %
Eosinophils Absolute: 0.3 10*3/uL (ref 0.0–0.5)
Eosinophils Relative: 5 %
HCT: 37.3 % (ref 36.0–46.0)
Hemoglobin: 12.3 g/dL (ref 12.0–15.0)
Immature Granulocytes: 1 %
Lymphocytes Relative: 28 %
Lymphs Abs: 1.5 10*3/uL (ref 0.7–4.0)
MCH: 32.7 pg (ref 26.0–34.0)
MCHC: 33 g/dL (ref 30.0–36.0)
MCV: 99.2 fL (ref 80.0–100.0)
Monocytes Absolute: 0.4 10*3/uL (ref 0.1–1.0)
Monocytes Relative: 7 %
Neutro Abs: 3.3 10*3/uL (ref 1.7–7.7)
Neutrophils Relative %: 58 %
Platelet Count: 175 10*3/uL (ref 150–400)
RBC: 3.76 MIL/uL — ABNORMAL LOW (ref 3.87–5.11)
RDW: 14.1 % (ref 11.5–15.5)
WBC Count: 5.6 10*3/uL (ref 4.0–10.5)
nRBC: 0 % (ref 0.0–0.2)

## 2022-12-06 LAB — CMP (CANCER CENTER ONLY)
ALT: 13 U/L (ref 0–44)
AST: 19 U/L (ref 15–41)
Albumin: 4.3 g/dL (ref 3.5–5.0)
Alkaline Phosphatase: 72 U/L (ref 38–126)
Anion gap: 6 (ref 5–15)
BUN: 38 mg/dL — ABNORMAL HIGH (ref 8–23)
CO2: 25 mmol/L (ref 22–32)
Calcium: 10.1 mg/dL (ref 8.9–10.3)
Chloride: 110 mmol/L (ref 98–111)
Creatinine: 1.34 mg/dL — ABNORMAL HIGH (ref 0.44–1.00)
GFR, Estimated: 41 mL/min — ABNORMAL LOW (ref >60)
Glucose, Bld: 113 mg/dL — ABNORMAL HIGH (ref 70–99)
Potassium: 5.2 mmol/L — ABNORMAL HIGH (ref 3.5–5.1)
Sodium: 141 mmol/L (ref 135–145)
Total Bilirubin: 0.4 mg/dL (ref 0.3–1.2)
Total Protein: 7.2 g/dL (ref 6.5–8.1)

## 2022-12-06 LAB — FOLATE: Folate: 22.7 ng/mL (ref >5.9)

## 2022-12-06 LAB — IRON AND IRON BINDING CAPACITY (CC-WL,HP ONLY)
Iron: 88 ug/dL (ref 28–170)
Saturation Ratios: 18 % (ref 10.4–31.8)
TIBC: 487 ug/dL — ABNORMAL HIGH (ref 250–450)
UIBC: 399 ug/dL (ref 148–442)

## 2022-12-06 LAB — FERRITIN: Ferritin: 23 ng/mL (ref 11–307)

## 2022-12-06 LAB — VITAMIN B12: Vitamin B-12: 1294 pg/mL — ABNORMAL HIGH (ref 180–914)

## 2022-12-06 NOTE — Progress Notes (Signed)
[facility] [phone]   [phone]  CONSULT NOTE  REFERRING PHYSICIAN: Dr. Cheryll Cockayne  REASON FOR CONSULTATION:  77 years old white female with history of iron deficiency anemia  HPI April Combs is a 77 y.o. female Discussed the use of AI scribe software for clinical note transcription with the patient, who gave verbal consent to proceed.  History of Present Illness   April Combs, a 77 year old white female, presents with a primary complaint of low iron levels. She was referred for a potential iron infusion. She first became aware of her anemia in August, but she has experienced intermittent periods of low iron in the past.  April Combs has been experiencing throbbing pain in both legs for approximately a year. Initially, the pain was nocturnal and isolated to one leg, but it has since progressed to affect both legs throughout the day. The pain has been managed with Tylenol and gabapentin, which provide some relief.  In addition to leg pain, April Combs reports intermittent dizziness that has been severe enough to confine her to bed for an entire day. The dizziness is exacerbated by movement and does not change with position. She denies any bleeding, chest pain, nausea, vomiting, diarrhea, or constipation. LABS Hb: 11.9 (11/09/2022) Hct: 36.5 (11/09/2022) Iron: 84 (11/09/2022) Iron saturation: 20% (11/09/2022) Ferritin: 20 (11/09/2022) April Combs has a medical history of anemia, anxiety, depression, acid reflux, high blood pressure, high cholesterol, hypothyroidism, osteoarthritis, heart disease, and lumbar radiculopathy. She has been on medication for diabetes due to elevated blood sugar levels, but her sugar levels are currently normal. She has no history of heart attack or stroke.  She has a family history of heart disease, multiple sclerosis, kidney disease, lung cancer, and colon cancer. She is a widow with three daughters, one of whom  has passed away. She is a retired Office manager. She has a history of smoking but quit in 2008. She does not drink alcohol regularly and has no history of drug abuse. She is not currently taking any supplements.       Past Medical History:  Diagnosis Date   Anemia    Anxiety    BRBPR (bright red blood per rectum) 02/05/2015   Coronary artery disease (CAD) excluded 01/2014   Low Risk Myoview (false positive) with possible inferior-inferolateral ischemia, but with nonsustained VT --> CATH --> Angiographically Normal Coronary Arteries; coronary CT angiogram showed mild approximately disease but otherwise no obstructive disease.  Coronary calcium score 40.   Depression    FALSE POSITIVE NUCLEAR STRESS CHEST 01/28/2014   Fungal dermatitis 09/29/2011   GERD (gastroesophageal reflux disease)    Gout 04/10/2017   First episode - Left foot 03/2017   Hyperlipidemia    Hyperlipidemia with target LDL less than 100 09/29/2011   Hyperplastic colon polyp    Hypertension    Hypertensive 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 (HCC) 04/26/2017   HYPERTRIGLYCERIDEMIA 07/12/2008   Qualifier: Diagnosis of  By: Beverely Low MD, Katherine     Hypothyroidism 02/09/2017   Irregular heart beat 09/29/2011   Lumbar radiculopathy 12/24/2014   Morbid obesity (HCC) 10/15/2015   NICM (nonischemic cardiomyopathy) (HCC)    Essentially resolved. Echocardiogram July 2013 showed normal EF greater than 55%. Important septal motion. Normal LV filling pressures. Mild MR and mild anterior MVP   Obesity (BMI 30-39.9)    Osteoarthritis    Positional vertigo    Prediabetes 08/10/2017   Pulmonary emphysema (HCC) 04/29/2015  PFTs 2017 - moderate-severe obstructive disease, some restrictive disease   SOB (shortness of breath) 08/23/2011    Past Surgical History:  Procedure Laterality Date   ABDOMINAL HYSTERECTOMY     APPENDECTOMY  as child   BREAST BIOPSY Right    benign   CHOLECYSTECTOMY      COLONOSCOPY     CORONARY CALCIUM SCORE/CT ANGIOGRAM  07/2017   Calcium score 40.  Mild LAD stenosis, but no other significant disease.   DOPPLER ECHOCARDIOGRAPHY  July 2013   09/20/11. normal Lv thickness and function with EF greater thatn 55%   Exercise tolerance test - CPET-MET-TEST  July 2013   Submaximal effort. Only 80% of her, therefore peak VO2 of 75% is likely an underestimate. High resting heart rate, achieving 86% of heart rate. --> Unable to interpret due to lack of effort.   GASTRIC BYPASS  1980   GASTRIC BYPASS     KIDNEY STONE SURGERY     LEFT HEART CATHETERIZATION WITH CORONARY ANGIOGRAM N/A 02/01/2014   Procedure: LEFT HEART CATHETERIZATION WITH CORONARY ANGIOGRAM;  Surgeon: Marykay Lex, MD;  Location: [facility] CATH [facility];  Service: Cardiovascular: Angiographically normal coronaries   NM MYOVIEW LTD  November 2015   4:20 min, 4.6 METS --> DOE, but no chest discomfort; Significant + ST -T wave changes noted with NSVT & PVCs. Images suggest Inferior-inferolateral ischemia.  Low Risk. -- FALSE POSITIVE   Pulmonary Function Tests  July 2013   Increased densities, decreased FVC - consistent with obesity hypoventilation; FEV1 is 58%, FVC 60% of predicted.   TRANSTHORACIC ECHOCARDIOGRAM  04/2017   Mildly reduced EF of 45%.  No regional wall motion normality.  GR 1 DD   UPPER GASTROINTESTINAL ENDOSCOPY      Family History  Problem Relation Age of Onset   Kidney disease Daughter    Multiple sclerosis Daughter    Heart disease Father    Heart disease Mother    Thyroid disease Mother    Heart disease Paternal Grandfather    Colon cancer Other        early 26's.  Genetic testing from maternal side of family.   Colon cancer Maternal Aunt    Breast cancer Maternal Aunt    Lung cancer Sister        smoker   Diabetes Sister    Colon cancer Maternal Uncle    Diabetes Daughter        x3   Heart disease Maternal Aunt    Colon polyps Neg Hx    Esophageal cancer Neg Hx    Gallbladder  disease Neg Hx    Rectal cancer Neg Hx    Stomach cancer Neg Hx     Social History Social History   Tobacco Use   Smoking status: Former    Current packs/day: 0.00    Average packs/day: 1 pack/day for 30.0 years (30.0 ttl pk-yrs)    Types: Cigarettes    Start date: 01/17/1973    Quit date: 01/18/2003    Years since quitting: 19.8   Smokeless tobacco: Never  Vaping Use   Vaping status: Never Used  Substance Use Topics   Alcohol use: Yes    Alcohol/week: 3.0 standard drinks of alcohol    Types: 3 Cans of beer per week    Comment: drinks 2-3 beers a week   Drug use: No    Allergies  Allergen Reactions   Semaglutide Diarrhea    Rybelsus - nausea and diarrhea    Current Outpatient  Medications  Medication Sig Dispense Refill   acetaminophen (TYLENOL) 325 MG tablet Take 2 tablets (650 mg total) by mouth every 6 (six) hours as needed for mild pain (or Fever >/= 101).     albuterol (PROVENTIL) (2.5 MG/3ML) 0.083% nebulizer solution USE 1 VIAL IN NEBULIZER EVERY 6 HOURS AS NEEDED FOR WHEEZING FOR SHORTNESS OF BREATH (Patient taking differently: Take 2.5 mg by nebulization in the morning and at bedtime.) 150 mL 0   albuterol (VENTOLIN HFA) 108 (90 Base) MCG/ACT inhaler Inhale 2 puffs into the lungs every 6 (six) hours as needed for wheezing or shortness of breath. 18 g 3   allopurinol (ZYLOPRIM) 300 MG tablet Take 1 tablet (300 mg total) by mouth daily. 90 tablet 3   aspirin EC 81 MG tablet Take 1 tablet (81 mg total) by mouth daily. Swallow whole. 30 tablet 12   atorvastatin (LIPITOR) 40 MG tablet Take 1 tablet (40 mg total) by mouth daily. 30 tablet 1   carvedilol (COREG) 12.5 MG tablet TAKE 1 AND 1/2 TABLETS BY MOUTH  TWICE DAILY WITH A MEAL (Patient taking differently: Take 18.75 mg by mouth 2 (two) times daily with a meal.) 270 tablet 3   empagliflozin (JARDIANCE) 10 MG TABS tablet Take 1 tablet (10 mg total) by mouth daily before breakfast. 30 tablet 5    Fluticasone-Umeclidin-Vilant (TRELEGY ELLIPTA) 100-62.5-25 MCG/ACT AEPB Inhale 1 puff into the lungs daily. Plans to start bevespi after completing rx for trelegy     furosemide (LASIX) 20 MG tablet Take 1 tablet (20 mg total) by mouth daily as needed. (Patient taking differently: Take 20 mg by mouth daily as needed for fluid.) 45 tablet 3   gabapentin (NEURONTIN) 100 MG capsule TAKE 2 CAPSULES BY MOUTH IN THE MORNING AND 3 CAPSULES  BY MOUTH AT BEDTIME 450 capsule 3   guaiFENesin (MUCINEX) 600 MG 12 hr tablet Take 1 tablet (600 mg total) by mouth 2 (two) times daily.     levothyroxine (SYNTHROID) 50 MCG tablet Take 1 tablet by mouth once daily 90 tablet 0   lisinopril-hydrochlorothiazide (ZESTORETIC) 20-25 MG tablet TAKE 1 TABLET BY MOUTH DAILY 90 tablet 3   omeprazole (PRILOSEC) 20 MG capsule Take 1 capsule (20 mg total) by mouth daily. TAKE 1 CAPSULE BY MOUTH  TWICE DAILY BEFORE A MEAL 90 capsule 3   No current facility-administered medications for this visit.    Review of Systems  Constitutional: positive for fatigue Eyes: negative Ears, nose, mouth, throat, and face: negative Respiratory: negative Cardiovascular: negative Gastrointestinal: negative Genitourinary:negative Integument/breast: negative Hematologic/lymphatic: negative Musculoskeletal:positive for arthralgias Neurological: positive for paresthesia Behavioral/Psych: negative Endocrine: negative Allergic/Immunologic: negative  Physical Exam  HKV:[id number], healthy, no distress, well nourished, and well developed SKIN: skin color, texture, turgor are normal, no rashes or significant lesions HEAD: Normocephalic, No masses, lesions, tenderness or abnormalities EYES: normal, PERRLA, Conjunctiva are pink and non-injected EARS: External ears normal, Canals clear OROPHARYNX:no exudate, no erythema, and lips, buccal mucosa, and tongue normal  NECK: supple, no adenopathy, no JVD LYMPH:  no palpable lymphadenopathy, no  hepatosplenomegaly BREAST:not examined LUNGS: clear to auscultation , and palpation HEART: regular rate & rhythm, no murmurs, and no gallops ABDOMEN:abdomen soft, non-tender, normal bowel sounds, and no masses or organomegaly BACK: Back symmetric, no curvature., No CVA tenderness EXTREMITIES:no joint deformities, effusion, or inflammation, no edema  NEURO: alert & oriented x 3 with fluent speech, no focal motor/sensory deficits  PERFORMANCE STATUS: ECOG 1  LABORATORY DATA: Lab Results  Component Value  Date   WBC 5.6 12/06/2022   HGB 12.3 12/06/2022   HCT 37.3 12/06/2022   MCV 99.2 12/06/2022   PLT 175 12/06/2022      Chemistry      Component Value Date/Time   NA 142 08/25/2022 0828   NA 143 07/04/2017 1121   K 4.1 08/25/2022 0828   CL 109 08/25/2022 0828   CO2 23 08/25/2022 0828   BUN 39 (H) 08/25/2022 0828   BUN 20 07/04/2017 1121   CREATININE 1.04 08/25/2022 0828   CREATININE 0.77 02/18/2016 0932      Component Value Date/Time   CALCIUM 9.2 08/25/2022 0828   ALKPHOS 61 08/25/2022 0828   AST 19 08/25/2022 0828   ALT 14 08/25/2022 0828   BILITOT 0.3 08/25/2022 0828       RADIOGRAPHIC STUDIES: CT CHEST LCS NODULE F/U LOW DOSE WO CONTRAST  Result Date: 11/26/2022 CLINICAL DATA:  77 year old female former smoker presents for three-month follow-up. EXAM: CT CHEST WITHOUT CONTRAST FOR LUNG CANCER SCREENING NODULE FOLLOW-UP TECHNIQUE: Multidetector CT imaging of the chest was performed following the standard protocol without IV contrast. RADIATION DOSE REDUCTION: This exam was performed according to the departmental dose-optimization program which includes automated exposure control, adjustment of the mA and/or kV according to patient size and/or use of iterative reconstruction technique. COMPARISON:  05/14/2022 screening chest CT. FINDINGS: Cardiovascular: Normal heart size. No significant pericardial effusion/thickening. Left anterior descending and right coronary  atherosclerosis. Atherosclerotic nonaneurysmal thoracic aorta. Normal caliber pulmonary arteries. Mediastinum/Nodes: No significant thyroid nodules. Unremarkable esophagus. No pathologically enlarged axillary, mediastinal or hilar lymph nodes, noting limited sensitivity for the detection of hilar adenopathy on this noncontrast study. Lungs/Pleura: No pneumothorax. No pleural effusion. Mild centrilobular emphysema with diffuse bronchial wall thickening. No acute consolidative airspace disease or lung masses. Nodular consolidation in the peripheral right lower lobe is decreased and more bandlike, now 9.5 mm on image 228, decreased from 11.4 mm. Other scattered subcentimeter pulmonary nodules are stable. Previously visualized patchy tree-in-bud opacities in both lungs are decreased, particularly in the posterior right lower lobe. Mild cylindrical bronchiectasis and bandlike subpleural scarring in the lingula and anteromedial upper lobes is unchanged. No new significant pulmonary nodules. Upper abdomen: Cholecystectomy. Embolization coils scattered in the left upper abdomen. Surgical suture line again noted in the stomach. Musculoskeletal: No aggressive appearing focal osseous lesions. Moderate thoracic spondylosis. IMPRESSION: 1. Lung-RADS 2, benign appearance or behavior. Continue annual screening with low-dose chest CT without contrast in 12 months. 2. Previously visualized patchy tree-in-bud opacities in both lungs are decreased, particularly in the posterior right lower lobe. Mild cylindrical bronchiectasis and bandlike subpleural scarring in the lingula and anteromedial upper lobes is unchanged. 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 3. Two-vessel coronary atherosclerosis. 4. Aortic Atherosclerosis (ICD10-I70.0) and Emphysema (ICD10-J43.9). Electronically Signed   By: Delbert Phenix M.D.   On: 11/26/2022 17:28    ASSESSMENT AND PLAN: This is a very  pleasant 77 years old white female presented for evaluation of iron deficiency anemia. Assessment and Plan    Anemia Mild anemia noted in August 2024 with normal iron studies. No current evidence of iron deficiency. Patient reports leg pain and intermittent dizziness, but no fatigue, bleeding, or shortness of breath. -Start over-the-counter iron supplement every other day with vitamin C or orange juice to enhance absorption. No need for IV Iron infusion at this time unless she has severe iron deficiency. -Repeat blood work in three months to reassess anemia.  Leg Pain Chronic, throbbing pain in both legs, worse at night. Partial relief with Tylenol and gabapentin. -Continue current pain management regimen.  Dizziness Intermittent episodes, worse with movement. No positional component identified. -Monitor symptoms.  General Health Maintenance / Followup Plans -Continue participation in lung cancer screening program due to history of smoking. -Follow-up in three months to reassess anemia and discuss potential need for iron infusion if significant drop in hemoglobin.     The patient was advised to call immediately if she has any other concerning symptoms in the interval. The patient voices understanding of current disease status and treatment options and is in agreement with the current care plan.  All questions were answered. The patient knows to call the clinic with any problems, questions or concerns. We can certainly see the patient much sooner if necessary.  Thank you so much for allowing me to participate in the care of April Combs. I will continue to follow up the patient with you and assist in her care.  The total time spent in the appointment was 60 minutes.  Disclaimer: This note was dictated with voice recognition software. Similar sounding words can inadvertently be transcribed and may not be corrected upon review.   Lajuana Matte December 06, 2022, 12:13 PM

## 2022-12-08 NOTE — Progress Notes (Signed)
MRI shows potential for nerves to be pinched.  You should hear from radiology soon about scheduling an epidural steroid injection.  If you would like to call them their number is [phone]

## 2022-12-08 NOTE — Telephone Encounter (Signed)
Epidural steroid injection ordered

## 2022-12-11 ENCOUNTER — Other Ambulatory Visit: Payer: Self-pay | Admitting: Internal Medicine

## 2022-12-14 DIAGNOSIS — R9389 Abnormal findings on diagnostic imaging of other specified body structures: Secondary | ICD-10-CM | POA: Diagnosis not present

## 2022-12-14 DIAGNOSIS — J449 Chronic obstructive pulmonary disease, unspecified: Secondary | ICD-10-CM | POA: Diagnosis not present

## 2022-12-14 DIAGNOSIS — Z87891 Personal history of nicotine dependence: Secondary | ICD-10-CM | POA: Diagnosis not present

## 2022-12-14 DIAGNOSIS — Z23 Encounter for immunization: Secondary | ICD-10-CM | POA: Diagnosis not present

## 2022-12-16 ENCOUNTER — Ambulatory Visit: Payer: Medicare Other

## 2022-12-16 VITALS — Ht 65.0 in | Wt 208.0 lb

## 2022-12-16 DIAGNOSIS — Z0000 Encounter for general adult medical examination without abnormal findings: Secondary | ICD-10-CM

## 2022-12-16 NOTE — Progress Notes (Signed)
Subjective:   April Combs is a 77 y.o. female who presents for Medicare Annual (Subsequent) preventive examination.  Visit Complete: Virtual  I connected with  April Combs on 12/16/22 by a audio enabled telemedicine application and verified that I am speaking with the correct person using two identifiers.  Patient Location: Home  Provider Location: Home Office  I discussed the limitations of evaluation and management by telemedicine. The patient expressed understanding and agreed to proceed.  Patient Medicare AWV questionnaire was completed by the patient on 12/12/22; I have confirmed that all information answered by patient is correct and no changes since this date.  Cardiac Risk Factors include: advanced age (>21men, >54 women);diabetes mellitus;hypertensionBecause this visit was a virtual/telehealth visit, some criteria may be missing or patient reported. Any vitals not documented were not able to be obtained and vitals that have been documented are patient reported.       Objective:    Today's Vitals   12/16/22 0925  Weight: 208 lb (94.3 kg)  Height: 5\' 5"  (1.651 m)   Body mass index is 34.61 kg/m.     12/16/2022    9:35 AM 06/27/2022    2:54 PM 06/27/2022    9:31 AM 12/07/2021    1:26 PM 03/14/2021    1:40 PM 12/05/2020    2:25 PM 10/29/2020   12:45 AM  Advanced Directives  Does Patient Have a Medical Advance Directive? Yes  No Yes No Yes No  Type of Estate agent of [place];Living will   Living will  Living will   Does patient want to make changes to medical advance directive?      No - Patient declined   Copy of Healthcare Power of Attorney in Chart? No - copy requested        Would patient like information on creating a medical advance directive?  No - Patient declined   Yes (ED - Information included in AVS)  No - Patient declined    Current Medications (verified) Outpatient Encounter Medications as of 12/16/2022  Medication Sig    acetaminophen (TYLENOL) 325 MG tablet Take 2 tablets (650 mg total) by mouth every 6 (six) hours as needed for mild pain (or Fever >/= 101).   albuterol (PROVENTIL) (2.5 MG/3ML) 0.083% nebulizer solution USE 1 VIAL IN NEBULIZER EVERY 6 HOURS AS NEEDED FOR WHEEZING FOR SHORTNESS OF BREATH (Patient taking differently: Take 2.5 mg by nebulization in the morning and at bedtime.)   albuterol (VENTOLIN HFA) 108 (90 Base) MCG/ACT inhaler Inhale 2 puffs into the lungs every 6 (six) hours as needed for wheezing or shortness of breath.   allopurinol (ZYLOPRIM) 300 MG tablet Take 1 tablet (300 mg total) by mouth daily.   aspirin EC 81 MG tablet Take 1 tablet (81 mg total) by mouth daily. Swallow whole.   atorvastatin (LIPITOR) 40 MG tablet Take 1 tablet (40 mg total) by mouth daily.   carvedilol (COREG) 12.5 MG tablet TAKE 1 AND 1/2 TABLETS BY MOUTH  TWICE DAILY WITH A MEAL (Patient taking differently: Take 18.75 mg by mouth 2 (two) times daily with a meal.)   empagliflozin (JARDIANCE) 10 MG TABS tablet Take 1 tablet (10 mg total) by mouth daily before breakfast.   Fluticasone-Umeclidin-Vilant (TRELEGY ELLIPTA) 100-62.5-25 MCG/ACT AEPB Inhale 1 puff into the lungs daily. Plans to start bevespi after completing rx for trelegy   furosemide (LASIX) 20 MG tablet Take 1 tablet (20 mg total) by mouth daily as needed. (Patient taking  differently: Take 20 mg by mouth daily as needed for fluid.)   gabapentin (NEURONTIN) 100 MG capsule TAKE 2 CAPSULES BY MOUTH IN THE MORNING AND 3 CAPSULES  BY MOUTH AT BEDTIME   guaiFENesin (MUCINEX) 600 MG 12 hr tablet Take 1 tablet (600 mg total) by mouth 2 (two) times daily.   levothyroxine (SYNTHROID) 50 MCG tablet Take 1 tablet by mouth once daily   lisinopril-hydrochlorothiazide (ZESTORETIC) 20-25 MG tablet TAKE 1 TABLET BY MOUTH DAILY   omeprazole (PRILOSEC) 20 MG capsule Take 1 capsule (20 mg total) by mouth daily. TAKE 1 CAPSULE BY MOUTH  TWICE DAILY BEFORE A MEAL   No  facility-administered encounter medications on file as of 12/16/2022.    Allergies (verified) Semaglutide   History: Past Medical History:  Diagnosis Date   Anemia    Anxiety    BRBPR (bright red blood per rectum) 02/05/2015   Coronary artery disease (CAD) excluded 01/2014   Low Risk Myoview (false positive) with possible inferior-inferolateral ischemia, but with nonsustained VT --> CATH --> Angiographically Normal Coronary Arteries; coronary CT angiogram showed mild approximately disease but otherwise no obstructive disease.  Coronary calcium score 40.   Depression    FALSE POSITIVE NUCLEAR STRESS CHEST 01/28/2014   Fungal dermatitis 09/29/2011   GERD (gastroesophageal reflux disease)    Gout 04/10/2017   First episode - Left foot 03/2017   Hyperlipidemia    Hyperlipidemia with target LDL less than 100 09/29/2011   Hyperplastic colon polyp    Hypertension    Hypertensive 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 (HCC) 04/26/2017   HYPERTRIGLYCERIDEMIA 07/12/2008   Qualifier: Diagnosis of  By: Beverely Low MD, Katherine     Hypothyroidism 02/09/2017   Irregular heart beat 09/29/2011   Lumbar radiculopathy 12/24/2014   Morbid obesity (HCC) 10/15/2015   NICM (nonischemic cardiomyopathy) (HCC)    Essentially resolved. Echocardiogram July 2013 showed normal EF greater than 55%. Important septal motion. Normal LV filling pressures. Mild MR and mild anterior MVP   Obesity (BMI 30-39.9)    Osteoarthritis    Positional vertigo    Prediabetes 08/10/2017   Pulmonary emphysema (HCC) 04/29/2015   PFTs 2017 - moderate-severe obstructive disease, some restrictive disease   SOB (shortness of breath) 08/23/2011   Past Surgical History:  Procedure Laterality Date   ABDOMINAL HYSTERECTOMY     APPENDECTOMY  as child   BREAST BIOPSY Right    benign   CHOLECYSTECTOMY     COLONOSCOPY     CORONARY CALCIUM SCORE/CT ANGIOGRAM  07/2017   Calcium score 40.  Mild LAD stenosis, but no other  significant disease.   DOPPLER ECHOCARDIOGRAPHY  July 2013   09/20/11. normal Lv thickness and function with EF greater thatn 55%   Exercise tolerance test - CPET-MET-TEST  July 2013   Submaximal effort. Only 80% of her, therefore peak VO2 of 75% is likely an underestimate. High resting heart rate, achieving 86% of heart rate. --> Unable to interpret due to lack of effort.   GASTRIC BYPASS  1980   GASTRIC BYPASS     KIDNEY STONE SURGERY     LEFT HEART CATHETERIZATION WITH CORONARY ANGIOGRAM N/A 02/01/2014   Procedure: LEFT HEART CATHETERIZATION WITH CORONARY ANGIOGRAM;  Surgeon: Marykay Lex, MD;  Location: [facility] CATH LAB;  Service: Cardiovascular: Angiographically normal coronaries   NM MYOVIEW LTD  November 2015   4:20 min, 4.6 METS --> DOE, but no chest discomfort; Significant + ST -T wave changes noted with NSVT & PVCs.  Images suggest Inferior-inferolateral ischemia.  Low Risk. -- FALSE POSITIVE   Pulmonary Function Tests  July 2013   Increased densities, decreased FVC - consistent with obesity hypoventilation; FEV1 is 58%, FVC 60% of predicted.   TRANSTHORACIC ECHOCARDIOGRAM  04/2017   Mildly reduced EF of 45%.  No regional wall motion normality.  GR 1 DD   UPPER GASTROINTESTINAL ENDOSCOPY     Family History  Problem Relation Age of Onset   Kidney disease Daughter    Multiple sclerosis Daughter    Heart disease Father    Heart disease Mother    Thyroid disease Mother    Heart disease Paternal Grandfather    Colon cancer Other        early 32's.  Genetic testing from maternal side of family.   Colon cancer Maternal Aunt    Breast cancer Maternal Aunt    Lung cancer Sister        smoker   Diabetes Sister    Colon cancer Maternal Uncle    Diabetes Daughter        x3   Heart disease Maternal Aunt    Colon polyps Neg Hx    Esophageal cancer Neg Hx    Gallbladder disease Neg Hx    Rectal cancer Neg Hx    Stomach cancer Neg Hx    Social History   Socioeconomic History    Marital status: Married    Spouse name: Not on file   Number of children: 3   Years of education: Not on file   Highest education level: 12th grade  Occupational History   Occupation: Retired  Tobacco Use   Smoking status: Former    Current packs/day: 0.00    Average packs/day: 1 pack/day for 30.0 years (30.0 ttl pk-yrs)    Types: Cigarettes    Start date: 01/17/1973    Quit date: 01/18/2003    Years since quitting: 19.9   Smokeless tobacco: Never  Vaping Use   Vaping status: Never Used  Substance and Sexual Activity   Alcohol use: Yes    Alcohol/week: 3.0 standard drinks of alcohol    Types: 3 Cans of beer per week    Comment: drinks 2-3 beers a week   Drug use: No   Sexual activity: Not on file  Other Topics Concern   Not on file  Social History Narrative   She is widowed-husband, Abundio Miu, was also a patient of Dr. Herbie Baltimore.   Mother of 3, grandmother 26.   Quit smoking in 2005.    Social alcohol.   No routine exercise.   Social Determinants of Health   Financial Resource Strain: Low Risk  (12/16/2022)   Overall Financial Resource Strain (CARDIA)    Difficulty of Paying Living Expenses: Not hard at all  Food Insecurity: No Food Insecurity (12/16/2022)   Hunger Vital Sign    Worried About Running Out of Food in the Last Year: Never true    Ran Out of Food in the Last Year: Never true  Transportation Needs: No Transportation Needs (12/16/2022)   PRAPARE - Administrator, Civil Service (Medical): No    Lack of Transportation (Non-Medical): No  Physical Activity: Inactive (12/16/2022)   Exercise Vital Sign    Days of Exercise per Week: 0 days    Minutes of Exercise per Session: 0 min  Stress: No Stress Concern Present (12/16/2022)   [employer] of Occupational Health - Occupational Stress Questionnaire    Feeling of Stress :  Not at all  Social Connections: Socially Isolated (12/16/2022)   Social Connection and Isolation Panel [NHANES]    Frequency of  Communication with Friends and Family: More than three times a week    Frequency of Social Gatherings with Friends and Family: More than three times a week    Attends Religious Services: Never    Database administrator or Organizations: No    Attends Banker Meetings: Never    Marital Status: Widowed    Tobacco Counseling Counseling given: Not Answered   Clinical Intake:  Pre-visit preparation completed: Yes  Pain : No/denies pain     BMI - recorded: 34.61 Nutritional Status: BMI > 30  Obese Nutritional Risks: None Diabetes: Yes CBG done?: No Did pt. bring in CBG monitor from home?: No  How often do you need to have someone help you when you read instructions, pamphlets, or other written materials from your doctor or pharmacy?: 2 - Rarely  Interpreter Needed?: No  Information entered by :: Theresa Mulligan LPN   Activities of Daily Living    12/16/2022    9:32 AM 12/12/2022   10:46 AM  In your present state of health, do you have any difficulty performing the following activities:  Hearing? 0 0  Vision? 0 0  Difficulty concentrating or making decisions? 0 0  Walking or climbing stairs? 1 1  Comment Due to back issues. Followed by medical attention   Dressing or bathing? 0 0  Doing errands, shopping? 0 0  Preparing Food and eating ? N N  Using the Toilet? N N  In the past six months, have you accidently leaked urine? N Y  Do you have problems with loss of bowel control? N N  Managing your Medications? N N  Managing your Finances? N N  Housekeeping or managing your Housekeeping? N N    Patient Care Team: Pincus Sanes, MD as PCP - General (Internal Medicine) Marykay Lex, MD as PCP - Cardiology (Cardiology) Marykay Lex, MD as Consulting Physician (Cardiology) Hart Carwin, MD (Inactive) as Consulting Physician (Gastroenterology) Kalman Shan, MD as Consulting Physician (Pulmonary Disease) Szabat, Vinnie Level, [facility] (Inactive)  (Pharmacist)  Indicate any recent Medical Services you may have received from other than [facility] providers in the past year (date may be approximate).     Assessment:   This is a routine wellness examination for April Combs.  Hearing/Vision screen Hearing Screening - Comments:: Denies hearing difficulties   Vision Screening - Comments:: Wears rx glasses - up to date with routine eye exams with  Deferred   Goals Addressed               This Visit's Progress     Live to 81th birthday! (pt-stated)         Depression Screen    12/16/2022    9:30 AM 11/02/2022    1:02 PM 07/07/2022    8:02 AM 02/19/2022    7:49 AM 12/07/2021    1:19 PM 08/20/2021    8:39 AM 12/05/2020    2:29 PM  PHQ 2/9 Scores  PHQ - 2 Score 0 0 0 0 0 0 0  PHQ- 9 Score 0 0    0     Fall Risk    12/16/2022    9:40 AM 12/12/2022   10:46 AM 12/05/2022    9:34 AM 11/02/2022    1:02 PM 07/07/2022    8:01 AM  Fall Risk   Falls in  the past year? 0 0 0 0 0  Number falls in past yr: 0 0 0 0 0  Injury with Fall? 0 0 0 0 0  Risk for fall due to : No Fall Risks No Fall Risks  No Fall Risks No Fall Risks  Follow up Falls prevention discussed Falls prevention discussed  Falls evaluation completed Falls evaluation completed    MEDICARE RISK AT HOME: Medicare Risk at Home Any stairs in or around the home?: Yes If so, are there any without handrails?: Yes Home free of loose throw rugs in walkways, pet beds, electrical cords, etc?: Yes Adequate lighting in your home to reduce risk of falls?: Yes Life alert?: No Use of a cane, walker or w/c?: No Grab bars in the bathroom?: No Shower chair or bench in shower?: No Elevated toilet seat or a handicapped toilet?: No  TIMED UP AND GO:  Was the test performed?  No    Cognitive Function:        12/16/2022    9:35 AM 12/07/2021    1:25 PM  6CIT Screen  What Year? 0 points 0 points  What month? 0 points 0 points  What time? 0 points 0 points  Count back from 20 0 points 0  points  Months in reverse 0 points 0 points  Repeat phrase 0 points 0 points  Total Score 0 points 0 points    Immunizations Immunization History  Administered Date(s) Administered   Fluad Quad(high Dose 65+) 11/09/2018   Influenza, High Dose Seasonal PF 01/28/2015, 12/24/2015, 12/13/2016, 11/16/2017, 12/28/2019   Influenza,inj,Quad PF,6+ Mos 12/24/2015   Influenza-Unspecified 12/11/2021   Janssen (J&J) SARS-COV-2 Vaccination 06/01/2019, 12/28/2019   Pneumococcal Conjugate-13 09/19/2013   Pneumococcal Polysaccharide-23 09/25/2014   Tdap 02/29/2008    TDAP status: Due, Education has been provided regarding the importance of this vaccine. Advised may receive this vaccine at local pharmacy or Health Dept. Aware to provide a copy of the vaccination record if obtained from local pharmacy or Health Dept. Verbalized acceptance and understanding.  Flu Vaccine status: Up to date  Pneumococcal vaccine status: Up to date  Covid-19 vaccine status: Declined, Education has been provided regarding the importance of this vaccine but patient still declined. Advised may receive this vaccine at local pharmacy or Health Dept.or vaccine clinic. Aware to provide a copy of the vaccination record if obtained from local pharmacy or Health Dept. Verbalized acceptance and understanding.  Qualifies for Shingles Vaccine? Yes   Zostavax completed No   Shingrix Completed?: No.    Education has been provided regarding the importance of this vaccine. Patient has been advised to call insurance company to determine out of pocket expense if they have not yet received this vaccine. Advised may also receive vaccine at local pharmacy or Health Dept. Verbalized acceptance and understanding.  Screening Tests Health Maintenance  Topic Date Due   FOOT EXAM  Never done   OPHTHALMOLOGY EXAM  Never done   Zoster Vaccines- Shingrix (1 of 2) Never done   DEXA SCAN  03/15/2017   DTaP/Tdap/Td (2 - Td or Tdap) 02/28/2018    COVID-19 Vaccine (3 - 2023-24 season) 11/14/2022   HEMOGLOBIN A1C  02/24/2023   Diabetic kidney evaluation - Urine ACR  08/25/2023   Diabetic kidney evaluation - eGFR measurement  12/06/2023   Medicare Annual Wellness (AWV)  12/16/2023   Pneumonia Vaccine 4+ Years old  Completed   INFLUENZA VACCINE  Completed   Hepatitis C Screening  Completed   HPV VACCINES  Aged Out   Colonoscopy  Discontinued    Health Maintenance  Health Maintenance Due  Topic Date Due   FOOT EXAM  Never done   OPHTHALMOLOGY EXAM  Never done   Zoster Vaccines- Shingrix (1 of 2) Never done   DEXA SCAN  03/15/2017   DTaP/Tdap/Td (2 - Td or Tdap) 02/28/2018   COVID-19 Vaccine (3 - 2023-24 season) 11/14/2022        Bone Density status: Ordered Patient declined. Pt provided with contact info and advised to call to schedule appt.    Additional Screening:  Hepatitis C Screening: does qualify; Completed 02/09/17  Vision Screening: Recommended annual ophthalmology exams for early detection of glaucoma and other disorders of the eye. Is the patient up to date with their annual eye exam?  Yes  Who is the provider or what is the name of the office in which the patient attends annual eye exams? Deferred If pt is not established with a provider, would they like to be referred to a provider to establish care? No .   Dental Screening: Recommended annual dental exams for proper oral hygiene  Diabetic Foot Exam: Diabetic Foot Exam: Overdue, Pt has been advised about the importance in completing this exam. Pt is scheduled for diabetic foot exam on Patient deferred.  Community Resource Referral / Chronic Care Management:  CRR required this visit?  No   CCM required this visit?  No     Plan:     I have personally reviewed and noted the following in the patient's chart:   Medical and social history Use of alcohol, tobacco or illicit drugs  Current medications and supplements including opioid prescriptions.  Patient is not currently taking opioid prescriptions. Functional ability and status Nutritional status Physical activity Advanced directives List of other physicians Hospitalizations, surgeries, and ER visits in previous 12 months Vitals Screenings to include cognitive, depression, and falls Referrals and appointments  In addition, I have reviewed and discussed with patient certain preventive protocols, quality metrics, and best practice recommendations. A written personalized care plan for preventive services as well as general preventive health recommendations were provided to patient.     Tillie Rung, LPN   16/03/958   After Visit Summary: (MyChart) Due to this being a telephonic visit, the after visit summary with patients personalized plan was offered to patient via MyChart   Nurse Notes: None

## 2022-12-16 NOTE — Discharge Instructions (Signed)

## 2022-12-16 NOTE — Patient Instructions (Addendum)
April Combs , Thank you for taking time to come for your Medicare Wellness Visit. I appreciate your ongoing commitment to your health goals. Please review the following plan we discussed and let me know if I can assist you in the future.   Referrals/Orders/Follow-Ups/Clinician Recommendations:   This is a list of the screening recommended for you and due dates:  Health Maintenance  Topic Date Due   Complete foot exam   Never done   Eye exam for diabetics  Never done   Zoster (Shingles) Vaccine (1 of 2) Never done   DEXA scan (bone density measurement)  03/15/2017   DTaP/Tdap/Td vaccine (2 - Td or Tdap) 02/28/2018   COVID-19 Vaccine (3 - 2023-24 season) 11/14/2022   Hemoglobin A1C  02/24/2023   Yearly kidney health urinalysis for diabetes  08/25/2023   Yearly kidney function blood test for diabetes  12/06/2023   Medicare Annual Wellness Visit  12/16/2023   Pneumonia Vaccine  Completed   Flu Shot  Completed   Hepatitis C Screening  Completed   HPV Vaccine  Aged Out   Colon Cancer Screening  Discontinued    Advanced directives: (Copy Requested) Please bring a copy of your health care power of attorney and living will to the office to be added to your chart at your convenience.  Next Medicare Annual Wellness Visit scheduled for next year: Yes

## 2022-12-17 ENCOUNTER — Ambulatory Visit
Admission: RE | Admit: 2022-12-17 | Discharge: 2022-12-17 | Disposition: A | Discharge status: Home / Self Care | Payer: Medicare Other | Source: Ambulatory Visit | Attending: Family Medicine | Admitting: Family Medicine

## 2022-12-17 DIAGNOSIS — M5416 Radiculopathy, lumbar region: Secondary | ICD-10-CM

## 2022-12-17 DIAGNOSIS — M4727 Other spondylosis with radiculopathy, lumbosacral region: Secondary | ICD-10-CM | POA: Diagnosis not present

## 2022-12-17 MED ORDER — METHYLPREDNISOLONE ACETATE 40 MG/ML INJ SUSP (RADIOLOG
80.0000 mg | Freq: Once | INTRAMUSCULAR | Status: AC
  Administered 2022-12-17: 80 mg via EPIDURAL

## 2022-12-17 MED ORDER — IOPAMIDOL (ISOVUE-M 200) INJECTION 41%
1.0000 mL | Freq: Once | INTRAMUSCULAR | Status: AC
  Administered 2022-12-17: 1 mL via EPIDURAL

## 2023-01-06 ENCOUNTER — Other Ambulatory Visit: Payer: Self-pay | Admitting: Internal Medicine

## 2023-02-23 ENCOUNTER — Encounter: Payer: Self-pay | Admitting: Internal Medicine

## 2023-02-23 NOTE — Progress Notes (Unsigned)
Subjective:    Patient ID: April Combs, female    DOB: [birth year], 77 y.o.   MRN: [id number]     HPI April Combs is here for follow up of her chronic medical problems.    Medications and allergies reviewed with patient and updated if appropriate.  Current Outpatient Medications on File Prior to Visit  Medication Sig Dispense Refill   acetaminophen (TYLENOL) 325 MG tablet Take 2 tablets (650 mg total) by mouth every 6 (six) hours as needed for mild pain (or Fever >/= 101).     albuterol (PROVENTIL) (2.5 MG/3ML) 0.083% nebulizer solution USE 1 VIAL IN NEBULIZER EVERY 6 HOURS AS NEEDED FOR WHEEZING FOR SHORTNESS OF BREATH (Patient taking differently: Take 2.5 mg by nebulization in the morning and at bedtime.) 150 mL 0   albuterol (VENTOLIN HFA) 108 (90 Base) MCG/ACT inhaler Inhale 2 puffs into the lungs every 6 (six) hours as needed for wheezing or shortness of breath. 18 g 3   allopurinol (ZYLOPRIM) 300 MG tablet Take 1 tablet (300 mg total) by mouth daily. 90 tablet 3   aspirin EC 81 MG tablet Take 1 tablet (81 mg total) by mouth daily. Swallow whole. 30 tablet 12   atorvastatin (LIPITOR) 40 MG tablet Take 1 tablet (40 mg total) by mouth daily. 30 tablet 1   carvedilol (COREG) 12.5 MG tablet TAKE 1 AND 1/2 TABLETS BY MOUTH  TWICE DAILY WITH A MEAL (Patient taking differently: Take 18.75 mg by mouth 2 (two) times daily with a meal.) 270 tablet 3   furosemide (LASIX) 20 MG tablet Take 1 tablet (20 mg total) by mouth daily as needed. (Patient taking differently: Take 20 mg by mouth daily as needed for fluid.) 45 tablet 3   gabapentin (NEURONTIN) 100 MG capsule TAKE 2 CAPSULES BY MOUTH IN THE MORNING AND 3 CAPSULES  BY MOUTH AT BEDTIME 450 capsule 3   guaiFENesin (MUCINEX) 600 MG 12 hr tablet Take 1 tablet (600 mg total) by mouth 2 (two) times daily.     JARDIANCE 10 MG TABS tablet TAKE 1 TABLET BY MOUTH ONCE DAILY BEFORE BREAKFAST 30 tablet 0   levothyroxine (SYNTHROID) 50 MCG tablet  Take 1 tablet by mouth once daily 90 tablet 0   lisinopril-hydrochlorothiazide (ZESTORETIC) 20-25 MG tablet TAKE 1 TABLET BY MOUTH DAILY 90 tablet 3   omeprazole (PRILOSEC) 20 MG capsule Take 1 capsule (20 mg total) by mouth daily. TAKE 1 CAPSULE BY MOUTH  TWICE DAILY BEFORE A MEAL 90 capsule 3   No current facility-administered medications on file prior to visit.     Review of Systems  Constitutional:  Negative for fever.  Respiratory:  Positive for shortness of breath (chronic - stable). Negative for cough and wheezing.   Cardiovascular:  Negative for chest pain, palpitations and leg swelling.  Musculoskeletal:  Positive for back pain.  Neurological:  Positive for light-headedness (occ - usually doing something). Negative for headaches.       Objective:   Vitals:   02/24/23 0750  BP: 124/78  Pulse: 84  Temp: 98 F (36.7 C)  SpO2: 97%   BP Readings from Last 3 Encounters:  02/24/23 124/78  12/17/22 (!) 126/51  12/06/22 (!) 123/51   Wt Readings from Last 3 Encounters:  02/24/23 201 lb (91.2 kg)  12/16/22 208 lb (94.3 kg)  12/06/22 208 lb 14.4 oz (94.8 kg)   Body mass index is 33.45 kg/m.    Physical Exam Constitutional:  General: She is not in acute distress.    Appearance: Normal appearance.  HENT:     Head: Normocephalic and atraumatic.  Eyes:     Conjunctiva/sclera: Conjunctivae normal.  Cardiovascular:     Rate and Rhythm: Normal rate and regular rhythm.     Heart sounds: Normal heart sounds.  Pulmonary:     Effort: Pulmonary effort is normal. No respiratory distress.     Breath sounds: Normal breath sounds. No wheezing.  Musculoskeletal:     Cervical back: Neck supple.     Right lower leg: No edema.     Left lower leg: No edema.  Lymphadenopathy:     Cervical: No cervical adenopathy.  Skin:    General: Skin is warm and dry.     Findings: No rash.  Neurological:     Mental Status: She is alert. Mental status is at baseline.  Psychiatric:         Mood and Affect: Mood normal.        Behavior: Behavior normal.       Diabetic Foot Exam - Simple   Simple Foot Form Diabetic Foot exam was performed with the following findings: Yes 02/24/2023  8:14 AM  Visual Inspection No deformities, no ulcerations, no other skin breakdown bilaterally: Yes See comments: Yes Sensation Testing Intact to touch and monofilament testing bilaterally: Yes Pulse Check Posterior Tibialis and Dorsalis pulse intact bilaterally: Yes Comments onychomycosis      Lab Results  Component Value Date   WBC 5.6 12/06/2022   HGB 12.3 12/06/2022   HCT 37.3 12/06/2022   PLT 175 12/06/2022   GLUCOSE 113 (H) 12/06/2022   CHOL 161 08/25/2022   TRIG 146.0 08/25/2022   HDL 66.80 08/25/2022   LDLDIRECT 74.0 08/20/2020   LDLCALC 65 08/25/2022   ALT 13 12/06/2022   AST 19 12/06/2022   NA 141 12/06/2022   K 5.2 (H) 12/06/2022   CL 110 12/06/2022   CREATININE 1.34 (H) 12/06/2022   BUN 38 (H) 12/06/2022   CO2 25 12/06/2022   TSH 4.58 08/25/2022   INR 1.08 01/28/2014   HGBA1C 5.7 08/25/2022   MICROALBUR 0.9 08/25/2022     Assessment & Plan:   Due for an eye exam - will schedule   See Problem List for Assessment and Plan of chronic medical problems.

## 2023-02-23 NOTE — Patient Instructions (Addendum)
      Blood work was ordered.       Medications changes include :   None    A referral was ordered and someone will call you to schedule an appointment.     Return in about 6 months (around 08/25/2023) for follow up.

## 2023-02-24 ENCOUNTER — Ambulatory Visit (INDEPENDENT_AMBULATORY_CARE_PROVIDER_SITE_OTHER): Payer: Medicare Other | Admitting: Internal Medicine

## 2023-02-24 VITALS — BP 124/78 | HR 84 | Temp 98.0°F | Ht 65.0 in | Wt 201.0 lb

## 2023-02-24 DIAGNOSIS — E538 Deficiency of other specified B group vitamins: Secondary | ICD-10-CM

## 2023-02-24 DIAGNOSIS — I10 Essential (primary) hypertension: Secondary | ICD-10-CM | POA: Diagnosis not present

## 2023-02-24 DIAGNOSIS — N1832 Chronic kidney disease, stage 3b: Secondary | ICD-10-CM | POA: Diagnosis not present

## 2023-02-24 DIAGNOSIS — E039 Hypothyroidism, unspecified: Secondary | ICD-10-CM

## 2023-02-24 DIAGNOSIS — D649 Anemia, unspecified: Secondary | ICD-10-CM | POA: Diagnosis not present

## 2023-02-24 DIAGNOSIS — E1169 Type 2 diabetes mellitus with other specified complication: Secondary | ICD-10-CM | POA: Diagnosis not present

## 2023-02-24 DIAGNOSIS — K219 Gastro-esophageal reflux disease without esophagitis: Secondary | ICD-10-CM | POA: Diagnosis not present

## 2023-02-24 DIAGNOSIS — M109 Gout, unspecified: Secondary | ICD-10-CM | POA: Diagnosis not present

## 2023-02-24 DIAGNOSIS — I700 Atherosclerosis of aorta: Secondary | ICD-10-CM

## 2023-02-24 DIAGNOSIS — J449 Chronic obstructive pulmonary disease, unspecified: Secondary | ICD-10-CM

## 2023-02-24 DIAGNOSIS — E785 Hyperlipidemia, unspecified: Secondary | ICD-10-CM

## 2023-02-24 DIAGNOSIS — I5042 Chronic combined systolic (congestive) and diastolic (congestive) heart failure: Secondary | ICD-10-CM | POA: Diagnosis not present

## 2023-02-24 LAB — CBC WITH DIFFERENTIAL/PLATELET
Basophils Absolute: 0.1 10*3/uL (ref 0.0–0.1)
Basophils Relative: 1.2 % (ref 0.0–3.0)
Eosinophils Absolute: 0.3 10*3/uL (ref 0.0–0.7)
Eosinophils Relative: 5.4 % — ABNORMAL HIGH (ref 0.0–5.0)
HCT: 36.4 % (ref 36.0–46.0)
Hemoglobin: 12 g/dL (ref 12.0–15.0)
Lymphocytes Relative: 35.6 % (ref 12.0–46.0)
Lymphs Abs: 1.7 10*3/uL (ref 0.7–4.0)
MCHC: 33 g/dL (ref 30.0–36.0)
MCV: 101.7 fL — ABNORMAL HIGH (ref 78.0–100.0)
Monocytes Absolute: 0.4 10*3/uL (ref 0.1–1.0)
Monocytes Relative: 7.9 % (ref 3.0–12.0)
Neutro Abs: 2.4 10*3/uL (ref 1.4–7.7)
Neutrophils Relative %: 49.9 % (ref 43.0–77.0)
Platelets: 153 10*3/uL (ref 150.0–400.0)
RBC: 3.58 Mil/uL — ABNORMAL LOW (ref 3.87–5.11)
RDW: 16 % — ABNORMAL HIGH (ref 11.5–15.5)
WBC: 4.7 10*3/uL (ref 4.0–10.5)

## 2023-02-24 LAB — COMPREHENSIVE METABOLIC PANEL
ALT: 14 U/L (ref 0–35)
AST: 18 U/L (ref 0–37)
Albumin: 4.1 g/dL (ref 3.5–5.2)
Alkaline Phosphatase: 67 U/L (ref 39–117)
BUN: 39 mg/dL — ABNORMAL HIGH (ref 6–23)
CO2: 23 meq/L (ref 19–32)
Calcium: 9.2 mg/dL (ref 8.4–10.5)
Chloride: 112 meq/L (ref 96–112)
Creatinine, Ser: 1.29 mg/dL — ABNORMAL HIGH (ref 0.40–1.20)
GFR: 40.31 mL/min — ABNORMAL LOW (ref >60.00)
Glucose, Bld: 102 mg/dL — ABNORMAL HIGH (ref 70–99)
Potassium: 4.5 meq/L (ref 3.5–5.1)
Sodium: 144 meq/L (ref 135–145)
Total Bilirubin: 0.4 mg/dL (ref 0.2–1.2)
Total Protein: 6.6 g/dL (ref 6.0–8.3)

## 2023-02-24 LAB — TSH: TSH: 4.44 u[IU]/mL (ref 0.35–5.50)

## 2023-02-24 LAB — FERRITIN: Ferritin: 33.3 ng/mL (ref 10.0–291.0)

## 2023-02-24 LAB — IBC PANEL
Iron: 90 ug/dL (ref 42–145)
Saturation Ratios: 23.5 % (ref 20.0–50.0)
TIBC: 383.6 ug/dL (ref 250.0–450.0)
Transferrin: 274 mg/dL (ref 212.0–360.0)

## 2023-02-24 LAB — LIPID PANEL
Cholesterol: 159 mg/dL (ref 0–200)
HDL: 57.6 mg/dL (ref >39.00)
LDL Cholesterol: 76 mg/dL (ref 0–99)
NonHDL: 101.58
Total CHOL/HDL Ratio: 3
Triglycerides: 126 mg/dL (ref 0.0–149.0)
VLDL: 25.2 mg/dL (ref 0.0–40.0)

## 2023-02-24 LAB — HEMOGLOBIN A1C: Hgb A1c MFr Bld: 6.2 % (ref 4.6–6.5)

## 2023-02-24 LAB — VITAMIN B12: Vitamin B-12: 757 pg/mL (ref 211–911)

## 2023-02-24 MED ORDER — TRELEGY ELLIPTA 100-62.5-25 MCG/ACT IN AEPB: 1.0000 | INHALATION_SPRAY | Freq: Every day | RESPIRATORY_TRACT | 5 refills | Status: DC

## 2023-02-24 MED ORDER — RYBELSUS 3 MG PO TABS: 3.0000 mg | ORAL_TABLET | Freq: Every day | ORAL | 2 refills | Status: DC

## 2023-02-24 NOTE — Assessment & Plan Note (Signed)
Chronic Not taking B12 Will check level

## 2023-02-24 NOTE — Assessment & Plan Note (Signed)
Chronic Echo 06/2022 with EF 40-45%, global LV hypokinesis, diastolic dysfunction indeterminate, but previous echo showed grade 1 DD Euvolemic today-no edema and shortness of breath is at her baseline and likely related to her COPD Continue Lasix 20 mg daily as needed, Coreg 18.75 mg twice daily, Jardiance 10 mg daily

## 2023-02-24 NOTE — Assessment & Plan Note (Signed)
Chronic Taking atorvastatin 20 mg daily LDL near goal of 70 She is not compliant with a low-fat/cholesterol diet.  Encouraged her to eat a more healthy diet

## 2023-02-24 NOTE — Assessment & Plan Note (Addendum)
Chronic   Lab Results  Component Value Date   HGBA1C 5.7 08/25/2022   Sugars well controlled Check A1c Continue Jardiance 10 mg daily Rybelsus 3 mg daily started to help with weight loss and sugar control Stressed regular exercise, diabetic diet

## 2023-02-24 NOTE — Assessment & Plan Note (Signed)
Chronic Healthy diet encouraged Check lipid panel Continue atorvastatin 40 mg daily

## 2023-02-24 NOTE — Assessment & Plan Note (Addendum)
Chronic controlled She is using her inhalers as prescribed-Trelegy once a day and albuterol every 6 hours as needed Following with pulmonary

## 2023-02-24 NOTE — Assessment & Plan Note (Signed)
Chronic GERD controlled Continue omeprazole 20 mg daily  

## 2023-02-24 NOTE — Assessment & Plan Note (Signed)
Chronic Blood pressure well controlled CMP Continue Coreg 18.75 mg twice daily, lisinopril-HCTZ 20-25 mg daily

## 2023-02-24 NOTE — Assessment & Plan Note (Signed)
Chronic Encouraged weight loss Encouraged decrease portions, diabetic diet Limited in her ability to be active by chronic pain

## 2023-02-24 NOTE — Assessment & Plan Note (Signed)
Chronic No recent gout flares Continue allopurinol 300 mg daily

## 2023-02-24 NOTE — Assessment & Plan Note (Signed)
Chronic  Clinically euthyroid Check tsh and will titrate med dose if needed Currently taking levothyroxine 50 mcg daily 

## 2023-02-24 NOTE — Assessment & Plan Note (Signed)
Chronic Stable CMP, CBC Continue Jardiance 10 mg daily, lisinopril 20 mg daily

## 2023-03-12 ENCOUNTER — Other Ambulatory Visit: Payer: Self-pay | Admitting: Internal Medicine

## 2023-03-17 ENCOUNTER — Telehealth: Payer: Self-pay | Admitting: Internal Medicine

## 2023-03-17 NOTE — Telephone Encounter (Signed)
 Patient dropped off document Patient Assistance Application (patient has dropped off two applications), to be filled out by provider. Patient requested to send it back via Fax within 7-days. Document is located in providers tray at front office.Please advise at [facility] [phone] or cell [phone]

## 2023-03-17 NOTE — Telephone Encounter (Signed)
 Paperwork received.  Will complete and place for Dr. Lawerance Bach to sign when she returns on Monday.

## 2023-03-18 ENCOUNTER — Telehealth: Payer: Self-pay | Admitting: Pharmacy Technician

## 2023-03-18 ENCOUNTER — Other Ambulatory Visit (HOSPITAL_COMMUNITY): Payer: Self-pay

## 2023-03-18 NOTE — Telephone Encounter (Signed)
Form completed and placed in Dr. Lawerance Bach office to sign.

## 2023-03-18 NOTE — Telephone Encounter (Signed)
 Pharmacy Patient Advocate Encounter   Received notification from CoverMyMeds that prior authorization for Rybelsus  3MG  tablets is required/requested.   Insurance verification completed.   The patient is insured through [facility] .   Per test claim: PA required; PA submitted to above mentioned insurance via CoverMyMeds Key/confirmation #/EOC Key: [id number] Status is pending

## 2023-03-21 ENCOUNTER — Ambulatory Visit: Payer: Medicare Other | Admitting: Internal Medicine

## 2023-03-21 ENCOUNTER — Other Ambulatory Visit: Payer: Medicare Other

## 2023-03-21 ENCOUNTER — Other Ambulatory Visit (HOSPITAL_COMMUNITY): Payer: Self-pay

## 2023-03-21 NOTE — Telephone Encounter (Signed)
 Pharmacy Patient Advocate Encounter  Received notification from [employer] that Prior Authorization for Rybelsus  3mg  tabs has been APPROVED from 03/18/23 to 03/14/24. Ran test claim, Copay is $47. This test claim was processed through [facility]- copay amounts may vary at other pharmacies due to pharmacy/plan contracts, or as the patient moves through the different stages of their insurance plan.   PA #/Case ID/Reference #: [id number]  Left a voicemail at [facility] to notify of the approval.

## 2023-03-22 NOTE — Telephone Encounter (Signed)
 Forms completed and faxed today.  Spoke with patient and she will pick up copy when she comes in for her next OV.

## 2023-03-23 ENCOUNTER — Ambulatory Visit: Payer: Medicare Other | Attending: Cardiology | Admitting: Cardiology

## 2023-03-23 VITALS — BP 122/70 | HR 75 | Ht 65.0 in | Wt 204.0 lb

## 2023-03-23 DIAGNOSIS — I110 Hypertensive heart disease with heart failure: Secondary | ICD-10-CM | POA: Insufficient documentation

## 2023-03-23 DIAGNOSIS — J439 Emphysema, unspecified: Secondary | ICD-10-CM | POA: Diagnosis not present

## 2023-03-23 DIAGNOSIS — E785 Hyperlipidemia, unspecified: Secondary | ICD-10-CM | POA: Insufficient documentation

## 2023-03-23 DIAGNOSIS — I5032 Chronic diastolic (congestive) heart failure: Secondary | ICD-10-CM | POA: Insufficient documentation

## 2023-03-23 DIAGNOSIS — I10 Essential (primary) hypertension: Secondary | ICD-10-CM | POA: Diagnosis not present

## 2023-03-23 DIAGNOSIS — I5042 Chronic combined systolic (congestive) and diastolic (congestive) heart failure: Secondary | ICD-10-CM | POA: Diagnosis not present

## 2023-03-23 DIAGNOSIS — I499 Cardiac arrhythmia, unspecified: Secondary | ICD-10-CM | POA: Insufficient documentation

## 2023-03-23 DIAGNOSIS — E66811 Obesity, class 1: Secondary | ICD-10-CM | POA: Insufficient documentation

## 2023-03-23 MED ORDER — ATORVASTATIN CALCIUM 40 MG PO TABS: 40.0000 mg | ORAL_TABLET | Freq: Every day | ORAL | 3 refills | Status: DC

## 2023-03-23 MED ORDER — FUROSEMIDE 20 MG PO TABS: 20.0000 mg | ORAL_TABLET | Freq: Every day | ORAL | 6 refills | Status: DC | PRN

## 2023-03-23 MED ORDER — LISINOPRIL-HYDROCHLOROTHIAZIDE 20-25 MG PO TABS: 1.0000 | ORAL_TABLET | Freq: Every day | ORAL | 3 refills | Status: DC

## 2023-03-23 MED ORDER — CARVEDILOL 12.5 MG PO TABS: 18.7500 mg | ORAL_TABLET | Freq: Two times a day (BID) | ORAL | 3 refills | Status: AC

## 2023-03-23 NOTE — Patient Instructions (Signed)

## 2023-03-23 NOTE — Progress Notes (Signed)
 Cardiology Office Note:  .   Date:  03/27/2023  ID:  April Combs, DOB 07/23/1945, MRN [id number] PCP: Geofm Glade PARAS, MD  [facility] Providers Cardiologist:  Alm Clay, MD     Chief Complaint  Patient presents with   Follow-up    Overall doing well.  Stable.  No major issues.  Palpitations controlled.  Weight able without improvement.  Now on Rybelsus .    Patient Profile: .     Laquilla Dault is an obese 78 y.o. 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    Starleen Trussell was last seen on March 23, 2022.  Had been doing okay recovering from 2 separate extensive episodes of COVID 19 infection.  She had lost some weight having started Ozempic  but was stopped after few months.  She had dropped down to almost 208 pounds (she estimated roughly 30 pounds).  Noting off-and-on palpitations-short little bursts.  Nothing prolonged.  No chest pain, just simply exertional dyspnea.  Limited by back and joint pain.  Also poor balance. => Stable from cardiac standpoint.  No changes made  ER-hospitalization 06/27/2022 for COPD exacerbation with mild troponin elevation.  No evidence of volume over load.  Was treated with nebulizers, Solu-Medrol , Rocephin  and Zithromax  along with IV Lasix .  Was felt to be related to influenza A exacerbating COPD.  => She was seen by her PCP shortly thereafter.  Restarted on Jardiance  for combination of CKD 3, mild CHF and diabetes.  She also was supposed to see pulmonary medicine at [facility].  Continued on as needed Lasix  20 mg.   Subjective  Reham Slabaugh returns here today for follow-up indicating that she is doing fairly well overall from a cardiac standpoint.  She she has a stable exertional dyspnea but it seems to be overall better.  Seen seems to have finally recovered from her April 2020  for COPD exacerbation. She also indicates that she is no longer on Jardiance  which was started by her PCP before.  And see if they switch back to a GLP-1 agonist with Rybelsus  which she seems be tolerating limited better than she did the injection Ozempic .  She thinks he may have lost a little more weight but is pretty stabilized now. She had a little bit of swelling over the Thanksgiving holidays probably due to dietary indiscretion that time resolved after a few days. She may get a chest discomfort but only if she overexerts herself.  She sleeps sitting up more from a GI standpoint then from a true CHF standpoint but no PND. She has some dizziness and unsteady gait but denies any syncope or near syncope.  Has not had any recent falls.   Cardiovascular ROS: positive for - dyspnea on exertion, shortness of breath, and these are at baseline; noted some swelling during Thanksgiving only ; rare palpitations; CP only with overexertion ; sleeps sitting up negative for - irregular heartbeat, loss of consciousness, paroxysmal nocturnal dyspnea, rapid heart rate, or syncopye/near syncope (but does get dizzy - unsteady), TIA/amaurosis fugax, CVA, claudication  ROS:  Review of Systems - Negative except Sx noted in HPI Respiratory ROS: positive for - cough, orthopnea, and shortness of breath negative for - hemoptysis, sputum changes, stridor, tachypnea, or wheezing Gastrointestinal ROS: positive for - gas/bloating and heartburn negative for - abdominal pain, blood  in stools, change in stools, melena, or nausea/vomiting Musculoskeletal ROS: positive for - joint pain, joint stiffness, and somewhat unsteady gait due to balance issues.  Not sure if this is neurologic related to work due to arthritis.    Objective   Studies Reviewed: SABRA   EKG Interpretation Date/Time:  Wednesday March 23 2023 08:58:28 EST Ventricular Rate:  75 PR Interval:  170 QRS Duration:  86 QT Interval:  380 QTC Calculation: 424 R  Axis:   54  Text Interpretation: Normal sinus rhythm Nonspecific T wave abnormality When compared with ECG of 27-Jun-2022 09:37, Premature ventricular complexes NO LONGER PRESENT Confirmed by Anner Lenis ([id number]) on 03/23/2023 9:01:45 AM    ECHO 06/27/2022: EF 40 to 45%.  Global HK.  Mild to moderate MR.  Similar to 2019  Coronary CTA 07/13/2017: Coronary Calcium  Score 40.  Mild proximal LAD stenosis but otherwise no significant disease.  Lab Results  Component Value Date   CHOL 159 02/24/2023   HDL 57.60 02/24/2023   LDLCALC 76 02/24/2023   LDLDIRECT 74.0 08/20/2020   TRIG 126.0 02/24/2023   CHOLHDL 3 02/24/2023   Lab Results  Component Value Date   NA 144 02/24/2023   K 4.5 02/24/2023   CREATININE 1.29 (H) 02/24/2023   GFR 40.31 (L) 02/24/2023   GLUCOSE 102 (H) 02/24/2023   Lab Results  Component Value Date   HGBA1C 6.2 02/24/2023    Risk Assessment/Calculations:           Physical Exam:   VS:  BP 122/70 (BP Location: Left Arm, Patient Position: Sitting, Cuff Size: Normal)   Pulse 75   Ht 5' 5 (1.651 m)   Wt 204 lb (92.5 kg)   SpO2 94%   BMI 33.95 kg/m    Wt Readings from Last 3 Encounters:  03/23/23 204 lb (92.5 kg)  02/24/23 201 lb (91.2 kg)  12/16/22 208 lb (94.3 kg)    GEN: Well nourished, well developed in no acute distress; healthy appearing NECK: No JVD; No carotid bruits CARDIAC: RRR, Normal S1, S2; 1/6 SEM at RUSB-neck.  Otherwise no rubs, gallops RESPIRATORY: With the exception of mild adventitial sounds, clear to auscultation without rales, wheezing or rhonchi ; nonlabored, good air movement. ABDOMEN: Soft, non-tender, non-distended EXTREMITIES:  No edema; No deformity      ASSESSMENT AND PLAN: .    Problem List Items Addressed This Visit       Cardiology Problems   Chronic combined systolic and diastolic heart failure (HCC) (Chronic)   She has NYHA class II symptoms symptoms of exertional dyspnea which I think is mostly related to her  deconditioning and COPD.  Besides some orthopnea symptoms which is probably more related to GERD, she denies any PND or significant edema. Echo does show moderately reduced EF, but has been stable. She really is only taking PRN Lasix  relatively frequently maybe once a month. He is on carvedilol  as well as lisinopril  and HCTZ, only requiring minimal Lasix . -Continue current management      Relevant Medications   atorvastatin  (LIPITOR) 40 MG tablet   carvedilol  (COREG ) 12.5 MG tablet   furosemide  (LASIX ) 20 MG tablet   lisinopril -hydrochlorothiazide  (ZESTORETIC ) 20-25 MG tablet   Essential hypertension (Chronic)   Chronic condition well-controlled pressures on current dose of carvedilol  18.75 mg twice daily (12.5 mg taking 1-1/2 tablets), and lisinopril -HCTZ 20-25 mg daily. -Continue current meds.      Relevant Medications   atorvastatin  (LIPITOR) 40 MG tablet   carvedilol  (COREG ) 12.5  MG tablet   furosemide  (LASIX ) 20 MG tablet   lisinopril -hydrochlorothiazide  (ZESTORETIC ) 20-25 MG tablet   Hyperlipidemia with target LDL less than 100 (Chronic)   LDL was 76 as of December 7975.-Tzoo within goal, given the relatively low Coronary Calcium  Score Remains on atorvastatin  40 mm daily. -Continue atorvastatin       Relevant Medications   atorvastatin  (LIPITOR) 40 MG tablet   carvedilol  (COREG ) 12.5 MG tablet   furosemide  (LASIX ) 20 MG tablet   lisinopril -hydrochlorothiazide  (ZESTORETIC ) 20-25 MG tablet   Other Relevant Orders   EKG 12-Lead (Completed)   Hypertensive 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 (HCC) - Primary (Chronic)   Relevant Medications   atorvastatin  (LIPITOR) 40 MG tablet   carvedilol  (COREG ) 12.5 MG tablet   furosemide  (LASIX ) 20 MG tablet   lisinopril -hydrochlorothiazide  (ZESTORETIC ) 20-25 MG tablet   Other Relevant Orders   EKG 12-Lead (Completed)     Other   Irregular heart beat (Chronic)   Palpitations well-controlled on current dose  of carvedilol .  Minimal symptoms, and denies any prolonged bursts of tachycardia      Relevant Orders   EKG 12-Lead (Completed)   Obesity (BMI 30.0-34.9) (Chronic)   Pretty stable weight.  Now back on Rybelsus  hoping for additional weight loss.  Has not needed much of effect with Rybelsus  that she did not Ozempic , but tolerating more.  Activity limited by chronic pain which does lead to some problems.      Relevant Orders   EKG 12-Lead (Completed)   Pulmonary emphysema (HCC) (Chronic)   Managed by PCP      Relevant Orders   EKG 12-Lead (Completed)    Follow-Up: Return in about 1 year (around 03/22/2024) for 1 Yr Follow-up.  Total time spent: 20 min spent with patient + 12 min spent charting = 32 min     Signed, Alm MICAEL Clay, MD, MS Alm Clay, M.D., M.S. Interventional Cardiologist  [facility]  Pager # [phone] Phone # [phone] [address] [place], [place] [address]

## 2023-03-27 ENCOUNTER — Encounter: Payer: Self-pay | Admitting: Cardiology

## 2023-03-27 NOTE — Assessment & Plan Note (Signed)
 She has NYHA class II symptoms symptoms of exertional dyspnea which I think is mostly related to her deconditioning and COPD.  Besides some orthopnea symptoms which is probably more related to GERD, she denies any PND or significant edema. Echo does show moderately reduced EF, but has been stable. She really is only taking PRN Lasix  relatively frequently maybe once a month. He is on carvedilol  as well as lisinopril  and HCTZ, only requiring minimal Lasix . -Continue current management

## 2023-03-27 NOTE — Assessment & Plan Note (Signed)
 Palpitations well-controlled on current dose of carvedilol.  Minimal symptoms, and denies any prolonged bursts of tachycardia

## 2023-03-27 NOTE — Assessment & Plan Note (Signed)
 Managed by PCP

## 2023-03-27 NOTE — Assessment & Plan Note (Addendum)
 LDL was 76 as of December 1610.-RUEA within goal, given the relatively low Coronary Calcium Score Remains on atorvastatin 40 mm daily. -Continue atorvastatin

## 2023-03-27 NOTE — Assessment & Plan Note (Addendum)
 Pretty stable weight.  Now back on Rybelsus hoping for additional weight loss.  Has not needed much of effect with Rybelsus that she did not Ozempic, but tolerating more.  Activity limited by chronic pain which does lead to some problems.

## 2023-03-27 NOTE — Assessment & Plan Note (Deleted)
 She has NYHA class II symptoms symptoms of exertional dyspnea which I think is mostly related to her deconditioning and COPD.  Besides some orthopnea symptoms which is probably more related to GERD, she denies any PND or significant edema. She really is only taking PRN Lasix  relatively frequently maybe once a month. He is on carvedilol  as well as lisinopril  and HCTZ, only requiring minimal Lasix . -Continue current management

## 2023-03-27 NOTE — Assessment & Plan Note (Signed)
>>  ASSESSMENT AND PLAN FOR PULMONARY EMPHYSEMA (HCC) WRITTEN ON 03/27/2023 10:41 PM BY HARDING, ALM ORN, MD  Managed by PCP

## 2023-03-27 NOTE — Assessment & Plan Note (Signed)
 Chronic condition well-controlled pressures on current dose of carvedilol 18.75 mg twice daily (12.5 mg taking 1-1/2 tablets), and lisinopril-HCTZ 20-25 mg daily. -Continue current meds.

## 2023-05-26 ENCOUNTER — Encounter: Payer: Self-pay | Admitting: Internal Medicine

## 2023-05-27 ENCOUNTER — Ambulatory Visit (INDEPENDENT_AMBULATORY_CARE_PROVIDER_SITE_OTHER): Admitting: Internal Medicine

## 2023-05-27 ENCOUNTER — Other Ambulatory Visit: Payer: Self-pay

## 2023-05-27 VITALS — BP 128/82 | HR 40 | Temp 98.7°F | Ht 65.0 in | Wt 202.0 lb

## 2023-05-27 DIAGNOSIS — E1169 Type 2 diabetes mellitus with other specified complication: Secondary | ICD-10-CM

## 2023-05-27 DIAGNOSIS — N3000 Acute cystitis without hematuria: Secondary | ICD-10-CM | POA: Insufficient documentation

## 2023-05-27 DIAGNOSIS — Z7984 Long term (current) use of oral hypoglycemic drugs: Secondary | ICD-10-CM | POA: Diagnosis not present

## 2023-05-27 DIAGNOSIS — R300 Dysuria: Secondary | ICD-10-CM

## 2023-05-27 LAB — POCT URINALYSIS DIPSTICK
Bilirubin, UA: NEGATIVE
Blood, UA: NEGATIVE
Glucose, UA: NEGATIVE
Ketones, UA: NEGATIVE
Nitrite, UA: NEGATIVE
Protein, UA: NEGATIVE
Spec Grav, UA: 1.02 (ref 1.010–1.025)
Urobilinogen, UA: NEGATIVE U/dL — AB
pH, UA: 6 (ref 5.0–8.0)

## 2023-05-27 MED ORDER — CEPHALEXIN 500 MG PO CAPS: 500.0000 mg | ORAL_CAPSULE | Freq: Two times a day (BID) | ORAL | 0 refills | Status: DC

## 2023-05-27 MED ORDER — RYBELSUS 7 MG PO TABS: 7.0000 mg | ORAL_TABLET | Freq: Every day | ORAL | 1 refills | Status: DC

## 2023-05-27 NOTE — Assessment & Plan Note (Signed)
 Chronic  Lab Results  Component Value Date   HGBA1C 6.2 02/24/2023   Sugars well controlled Doing well with Rybelsus 3 mg daily and would like to increase Increase Rybelsus to 7 mg daily

## 2023-05-27 NOTE — Patient Instructions (Addendum)
       Medications changes include :   keflex twice daily for one week, increase rybelsus to 7 mg       Return if symptoms worsen or fail to improve.

## 2023-05-27 NOTE — Progress Notes (Signed)
 Subjective:    Patient ID: April Combs, female    DOB: [birth year], 78 y.o.   MRN: [id number]      HPI April Combs is here for  Chief Complaint  Patient presents with   Urinary Tract Infection    PT  has take azo and stated that it didn't help.     She has been experiencing urine symptoms recently.  She states bladder pressure, frequency and sometimes urgency.  She denies any dysuria.  Occasionally she has a urine odor, but not really regularly.  She denies any blood in the urine.  No fever, nausea, abdominal pain or new back pain.  She did try Azo and it helped a little bit, but did not resolve the symptoms.    Medications and allergies reviewed with patient and updated if appropriate.  Current Outpatient Medications on File Prior to Visit  Medication Sig Dispense Refill   acetaminophen (TYLENOL) 325 MG tablet Take 2 tablets (650 mg total) by mouth every 6 (six) hours as needed for mild pain (or Fever >/= 101).     albuterol (PROVENTIL) (2.5 MG/3ML) 0.083% nebulizer solution USE 1 VIAL IN NEBULIZER EVERY 6 HOURS AS NEEDED FOR WHEEZING FOR SHORTNESS OF BREATH (Patient taking differently: Take 2.5 mg by nebulization in the morning and at bedtime.) 150 mL 0   albuterol (VENTOLIN HFA) 108 (90 Base) MCG/ACT inhaler Inhale 2 puffs into the lungs every 6 (six) hours as needed for wheezing or shortness of breath. 18 g 3   allopurinol (ZYLOPRIM) 300 MG tablet Take 1 tablet (300 mg total) by mouth daily. 90 tablet 3   aspirin EC 81 MG tablet Take 1 tablet (81 mg total) by mouth daily. Swallow whole. 30 tablet 12   atorvastatin (LIPITOR) 40 MG tablet Take 1 tablet (40 mg total) by mouth daily. 90 tablet 3   carvedilol (COREG) 12.5 MG tablet Take 1.5 tablets (18.75 mg total) by mouth 2 (two) times daily. 270 tablet 3   Fluticasone-Umeclidin-Vilant (TRELEGY ELLIPTA) 100-62.5-25 MCG/ACT AEPB Inhale 1 puff into the lungs daily. Plans to start bevespi after completing rx for trelegy 60 each 5    furosemide (LASIX) 20 MG tablet Take 1 tablet (20 mg total) by mouth daily as needed. 45 tablet 6   gabapentin (NEURONTIN) 100 MG capsule TAKE 2 CAPSULES BY MOUTH IN THE MORNING AND 3 CAPSULES  BY MOUTH AT BEDTIME 450 capsule 3   guaiFENesin (MUCINEX) 600 MG 12 hr tablet Take 1 tablet (600 mg total) by mouth 2 (two) times daily.     levothyroxine (SYNTHROID) 50 MCG tablet Take 1 tablet by mouth once daily 90 tablet 0   lisinopril-hydrochlorothiazide (ZESTORETIC) 20-25 MG tablet Take 1 tablet by mouth daily. 90 tablet 3   omeprazole (PRILOSEC) 20 MG capsule Take 1 capsule (20 mg total) by mouth daily. TAKE 1 CAPSULE BY MOUTH  TWICE DAILY BEFORE A MEAL 90 capsule 3   Semaglutide (RYBELSUS) 3 MG TABS Take 1 tablet (3 mg total) by mouth daily. 30 tablet 2   No current facility-administered medications on file prior to visit.    Review of Systems  Constitutional:  Negative for fever.  Gastrointestinal:  Negative for abdominal pain and nausea.  Genitourinary:  Positive for frequency and urgency. Negative for dysuria and hematuria.       Pressure in bladder, no cloudiness,  occ odor  Musculoskeletal:  Negative for back pain.       Objective:   Vitals:   05/27/23  1557  BP: 128/82  Pulse: (!) 40  Temp: 98.7 F (37.1 C)  SpO2: 98%   BP Readings from Last 3 Encounters:  05/27/23 128/82  03/23/23 122/70  02/24/23 124/78   Wt Readings from Last 3 Encounters:  05/27/23 202 lb (91.6 kg)  03/23/23 204 lb (92.5 kg)  02/24/23 201 lb (91.2 kg)   Body mass index is 33.61 kg/m.    Physical Exam Constitutional:      General: She is not in acute distress.    Appearance: Normal appearance. She is not ill-appearing.  HENT:     Head: Normocephalic.  Eyes:     Conjunctiva/sclera: Conjunctivae normal.  Abdominal:     General: There is no distension.     Palpations: Abdomen is soft.     Tenderness: There is no abdominal tenderness. There is no right CVA tenderness, left CVA tenderness,  guarding or rebound.  Skin:    General: Skin is warm and dry.  Neurological:     Mental Status: She is alert.            Assessment & Plan:    See Problem List for Assessment and Plan of chronic medical problems.

## 2023-05-27 NOTE — Assessment & Plan Note (Signed)
 Acute Urine dip consistent with UTI Will send urine for culture Take the antibiotic as prescribed.  Keflex 500 mg twice daily x 7 days Can take Azo if she wishes Take tylenol if needed.   Increase your water intake.  Call if no improvement

## 2023-05-29 LAB — CULTURE, URINE COMPREHENSIVE

## 2023-05-30 MED ORDER — SULFAMETHOXAZOLE-TRIMETHOPRIM 800-160 MG PO TABS: 1.0000 | ORAL_TABLET | Freq: Two times a day (BID) | ORAL | 0 refills | Status: AC

## 2023-05-30 NOTE — Addendum Note (Signed)
 Addended by: Pincus Sanes on: 05/30/2023 07:16 AM   Modules accepted: Orders

## 2023-06-08 ENCOUNTER — Other Ambulatory Visit: Payer: Self-pay | Admitting: Internal Medicine

## 2023-08-24 ENCOUNTER — Encounter: Payer: Self-pay | Admitting: Internal Medicine

## 2023-08-24 NOTE — Progress Notes (Signed)
 Subjective:    Patient ID: April Combs, female    DOB: Nov 07, 1945, 78 y.o.   MRN: [id number]     HPI April Combs is here for follow up of her chronic medical problems.  Not taking Jardiance   Having dysuria and increased urinary frequency - has been going on for a while - worse in past 2 weeks or so.    Medications and allergies reviewed with patient and updated if appropriate.  Current Outpatient Medications on File Prior to Visit  Medication Sig Dispense Refill   acetaminophen  (TYLENOL ) 325 MG tablet Take 2 tablets (650 mg total) by mouth every 6 (six) hours as needed for mild pain (or Fever >/= 101).     albuterol  (PROVENTIL ) (2.5 MG/3ML) 0.083% nebulizer solution USE 1 VIAL IN NEBULIZER EVERY 6 HOURS AS NEEDED FOR WHEEZING FOR SHORTNESS OF BREATH (Patient taking differently: Take 2.5 mg by nebulization in the morning and at bedtime.) 150 mL 0   albuterol  (VENTOLIN  HFA) 108 (90 Base) MCG/ACT inhaler Inhale 2 puffs into the lungs every 6 (six) hours as needed for wheezing or shortness of breath. 18 g 3   allopurinol  (ZYLOPRIM ) 300 MG tablet Take 1 tablet (300 mg total) by mouth daily. 90 tablet 3   aspirin  EC 81 MG tablet Take 1 tablet (81 mg total) by mouth daily. Swallow whole. 30 tablet 12   atorvastatin  (LIPITOR) 40 MG tablet Take 1 tablet (40 mg total) by mouth daily. 90 tablet 3   carvedilol  (COREG ) 12.5 MG tablet Take 1.5 tablets (18.75 mg total) by mouth 2 (two) times daily. 270 tablet 3   Fluticasone -Umeclidin-Vilant (TRELEGY ELLIPTA ) 100-62.5-25 MCG/ACT AEPB Inhale 1 puff into the lungs daily. Plans to start bevespi  after completing rx for trelegy 60 each 5   furosemide  (LASIX ) 20 MG tablet Take 1 tablet (20 mg total) by mouth daily as needed. 45 tablet 6   gabapentin  (NEURONTIN ) 100 MG capsule TAKE 2 CAPSULES BY MOUTH IN THE MORNING AND 3 CAPSULES  BY MOUTH AT BEDTIME 450 capsule 3   guaiFENesin  (MUCINEX ) 600 MG 12 hr tablet Take 1 tablet (600 mg total) by mouth 2  (two) times daily.     levothyroxine  (SYNTHROID ) 50 MCG tablet Take 1 tablet by mouth once daily 90 tablet 1   lisinopril -hydrochlorothiazide  (ZESTORETIC ) 20-25 MG tablet Take 1 tablet by mouth daily. 90 tablet 3   omeprazole  (PRILOSEC) 20 MG capsule Take 1 capsule (20 mg total) by mouth daily. TAKE 1 CAPSULE BY MOUTH  TWICE DAILY BEFORE A MEAL 90 capsule 3   Semaglutide  (RYBELSUS ) 7 MG TABS Take 1 tablet (7 mg total) by mouth daily. 90 tablet 1   No current facility-administered medications on file prior to visit.     Review of Systems  Constitutional:  Negative for fever.  HENT:  Positive for hearing loss (left ear) and postnasal drip.   Respiratory:  Positive for cough (mainly at night) and shortness of breath (chronic - at baseline). Negative for wheezing.   Cardiovascular:  Positive for leg swelling (chronic - stable). Negative for chest pain and palpitations.  Gastrointestinal:        No gerd  Genitourinary:  Positive for dysuria and frequency. Negative for hematuria.  Neurological:  Positive for dizziness (occ) and headaches. Negative for light-headedness.       Objective:   Vitals:   08/25/23 0758  BP: 118/68  Pulse: (!) 43  Temp: 98.1 F (36.7 C)  SpO2: 97%   BP  Readings from Last 3 Encounters:  08/25/23 118/68  05/27/23 128/82  03/23/23 122/70   Wt Readings from Last 3 Encounters:  08/25/23 193 lb (87.5 kg)  05/27/23 202 lb (91.6 kg)  03/23/23 204 lb (92.5 kg)   Body mass index is 32.12 kg/m.    Physical Exam Constitutional:      General: She is not in acute distress.    Appearance: Normal appearance. She is not ill-appearing.  HENT:     Head: Normocephalic and atraumatic.   Eyes:     Conjunctiva/sclera: Conjunctivae normal.    Cardiovascular:     Rate and Rhythm: Normal rate and regular rhythm.     Heart sounds: Normal heart sounds.  Pulmonary:     Effort: Pulmonary effort is normal. No respiratory distress.     Breath sounds: Normal breath  sounds. No wheezing.  Abdominal:     General: There is no distension.     Palpations: Abdomen is soft.     Tenderness: There is no abdominal tenderness. There is no right CVA tenderness, left CVA tenderness, guarding or rebound.   Musculoskeletal:     Cervical back: Neck supple.     Right lower leg: No edema.     Left lower leg: No edema.  Lymphadenopathy:     Cervical: No cervical adenopathy.   Skin:    General: Skin is warm and dry.     Findings: No rash.   Neurological:     Mental Status: She is alert. Mental status is at baseline.   Psychiatric:        Mood and Affect: Mood normal.        Behavior: Behavior normal.        Lab Results  Component Value Date   WBC 4.7 02/24/2023   HGB 12.0 02/24/2023   HCT 36.4 02/24/2023   PLT 153.0 02/24/2023   GLUCOSE 102 (H) 02/24/2023   CHOL 159 02/24/2023   TRIG 126.0 02/24/2023   HDL 57.60 02/24/2023   LDLDIRECT 74.0 08/20/2020   LDLCALC 76 02/24/2023   ALT 14 02/24/2023   AST 18 02/24/2023   NA 144 02/24/2023   K 4.5 02/24/2023   CL 112 02/24/2023   CREATININE 1.29 (H) 02/24/2023   BUN 39 (H) 02/24/2023   CO2 23 02/24/2023   TSH 4.44 02/24/2023   INR 1.08 01/28/2014   HGBA1C 6.2 02/24/2023   MICROALBUR 0.9 08/25/2022     Assessment & Plan:    See Problem List for Assessment and Plan of chronic medical problems.

## 2023-08-24 NOTE — Patient Instructions (Addendum)
    Make an eye appointment.    Blood work was ordered.       Medications changes include :   Augmentin twice a day for one week.       Return in about 6 months (around 02/24/2024) for follow up.

## 2023-08-25 ENCOUNTER — Encounter: Payer: Self-pay | Admitting: Internal Medicine

## 2023-08-25 ENCOUNTER — Ambulatory Visit (INDEPENDENT_AMBULATORY_CARE_PROVIDER_SITE_OTHER): Payer: Medicare Other | Admitting: Internal Medicine

## 2023-08-25 VITALS — BP 118/68 | HR 43 | Temp 98.1°F | Wt 193.0 lb

## 2023-08-25 DIAGNOSIS — I10 Essential (primary) hypertension: Secondary | ICD-10-CM | POA: Diagnosis not present

## 2023-08-25 DIAGNOSIS — E1169 Type 2 diabetes mellitus with other specified complication: Secondary | ICD-10-CM | POA: Diagnosis not present

## 2023-08-25 DIAGNOSIS — E559 Vitamin D deficiency, unspecified: Secondary | ICD-10-CM

## 2023-08-25 DIAGNOSIS — E538 Deficiency of other specified B group vitamins: Secondary | ICD-10-CM | POA: Diagnosis not present

## 2023-08-25 DIAGNOSIS — M109 Gout, unspecified: Secondary | ICD-10-CM | POA: Diagnosis not present

## 2023-08-25 DIAGNOSIS — K219 Gastro-esophageal reflux disease without esophagitis: Secondary | ICD-10-CM

## 2023-08-25 DIAGNOSIS — E66811 Obesity, class 1: Secondary | ICD-10-CM | POA: Diagnosis not present

## 2023-08-25 DIAGNOSIS — N1832 Chronic kidney disease, stage 3b: Secondary | ICD-10-CM | POA: Diagnosis not present

## 2023-08-25 DIAGNOSIS — I5042 Chronic combined systolic (congestive) and diastolic (congestive) heart failure: Secondary | ICD-10-CM

## 2023-08-25 DIAGNOSIS — R300 Dysuria: Secondary | ICD-10-CM

## 2023-08-25 DIAGNOSIS — J449 Chronic obstructive pulmonary disease, unspecified: Secondary | ICD-10-CM | POA: Diagnosis not present

## 2023-08-25 DIAGNOSIS — E039 Hypothyroidism, unspecified: Secondary | ICD-10-CM | POA: Diagnosis not present

## 2023-08-25 DIAGNOSIS — Z7984 Long term (current) use of oral hypoglycemic drugs: Secondary | ICD-10-CM

## 2023-08-25 DIAGNOSIS — N390 Urinary tract infection, site not specified: Secondary | ICD-10-CM | POA: Insufficient documentation

## 2023-08-25 DIAGNOSIS — E785 Hyperlipidemia, unspecified: Secondary | ICD-10-CM

## 2023-08-25 DIAGNOSIS — R399 Unspecified symptoms and signs involving the genitourinary system: Secondary | ICD-10-CM | POA: Diagnosis not present

## 2023-08-25 DIAGNOSIS — N3000 Acute cystitis without hematuria: Secondary | ICD-10-CM

## 2023-08-25 HISTORY — DX: Urinary tract infection, site not specified: N39.0

## 2023-08-25 LAB — POCT URINALYSIS DIPSTICK
Bilirubin, UA: NEGATIVE
Blood, UA: NEGATIVE
Glucose, UA: NEGATIVE
Ketones, UA: NEGATIVE
Nitrite, UA: NEGATIVE
Protein, UA: NEGATIVE
Spec Grav, UA: 1.015 (ref 1.010–1.025)
Urobilinogen, UA: NEGATIVE U/dL — AB
pH, UA: 6 (ref 5.0–8.0)

## 2023-08-25 LAB — MICROALBUMIN / CREATININE URINE RATIO
Creatinine,U: 64 mg/dL
Microalb Creat Ratio: 50 mg/g — ABNORMAL HIGH (ref 0.0–30.0)
Microalb, Ur: 3.2 mg/dL — ABNORMAL HIGH (ref 0.0–1.9)

## 2023-08-25 MED ORDER — PREMARIN 0.625 MG/GM VA CREA: 1.0000 | TOPICAL_CREAM | VAGINAL | 12 refills | Status: AC

## 2023-08-25 MED ORDER — AMOXICILLIN-POT CLAVULANATE 875-125 MG PO TABS: 1.0000 | ORAL_TABLET | Freq: Two times a day (BID) | ORAL | 0 refills | Status: AC

## 2023-08-25 NOTE — Assessment & Plan Note (Signed)
 Chronic Controlled Chronic shortness of breath-stable She is using her inhalers as prescribed-Trelegy once a day and albuterol  every 6 hours as needed Following with pulmonary

## 2023-08-25 NOTE — Assessment & Plan Note (Addendum)
 Chronic  Lab Results  Component Value Date   HGBA1C 6.2 02/24/2023   Sugars well controlled Stopped jardiance  when she started rybelsus  - did not know she was supposed to stay on -- she is having frequent UTIs so will hold Check A1c, urine albumin/creatinine ratio Continue Rybelsus  to 7 mg daily Stressed diabetic diet

## 2023-08-25 NOTE — Assessment & Plan Note (Addendum)
 Chronic Level was good 6 months ago Will check annually

## 2023-08-25 NOTE — Assessment & Plan Note (Signed)
Chronic Blood pressure well controlled CMP Continue Coreg 18.75 mg twice daily, lisinopril-HCTZ 20-25 mg daily

## 2023-08-25 NOTE — Assessment & Plan Note (Signed)
 Chronic  Clinically euthyroid Check tsh and will titrate med dose if needed Currently taking levothyroxine 50 mcg daily

## 2023-08-25 NOTE — Assessment & Plan Note (Signed)
 Chronic Stable CMP, CBC Continue lisinopril  20 mg daily

## 2023-08-25 NOTE — Assessment & Plan Note (Signed)
Chronic No recent gout flares Continue allopurinol 300 mg daily

## 2023-08-25 NOTE — Assessment & Plan Note (Signed)
 Start vaginal estrogen to help prevent UTIs

## 2023-08-25 NOTE — Assessment & Plan Note (Signed)
Chronic GERD controlled Continue omeprazole 20 mg daily  

## 2023-08-25 NOTE — Assessment & Plan Note (Addendum)
 Acute Started a while ago - wose in past 2.5 weeks Urine dip consistent with UTI Will send urine for culture Take the antibiotic as prescribed.  Augmentin twice daily x 7 days Can take Azo if she wishes Take tylenol  if needed.   Increase your water intake.  Call if no improvement   Start vaginal estrogen to help prevent UTIs

## 2023-08-25 NOTE — Assessment & Plan Note (Deleted)
 Acute Started a while ago - wose in past 2.5 weeks

## 2023-08-25 NOTE — Assessment & Plan Note (Signed)
 Chronic Taking vitamin D daily Check vitamin D level

## 2023-08-25 NOTE — Assessment & Plan Note (Signed)
 Chronic Echo 06/2022 with EF 40-45%, global LV hypokinesis, diastolic dysfunction indeterminate, but previous echo showed grade 1 DD Euvolemic today-no edema and shortness of breath is at her baseline and likely related to her COPD Continue Lasix  20 mg daily as needed, Coreg  18.75 mg twice daily

## 2023-08-25 NOTE — Assessment & Plan Note (Signed)
Chronic Healthy diet encouraged Check lipid panel Continue atorvastatin 40 mg daily

## 2023-08-25 NOTE — Assessment & Plan Note (Addendum)
 Chronic Losing weight with the help of Rybelsus -continue current dose Encouraged decrease portions, diabetic diet Limited in her ability to be active by chronic pain, but encouraged to be as active as possible

## 2023-08-27 ENCOUNTER — Ambulatory Visit: Payer: Self-pay | Admitting: Internal Medicine

## 2023-08-27 LAB — COMPREHENSIVE METABOLIC PANEL WITH GFR
AG Ratio: 2 (calc) (ref 1.0–2.5)
ALT: 20 U/L (ref 6–29)
AST: 24 U/L (ref 10–35)
Albumin: 3.9 g/dL (ref 3.6–5.1)
Alkaline phosphatase (APISO): 85 U/L (ref 37–153)
BUN/Creatinine Ratio: 15 (calc) (ref 6–22)
BUN: 20 mg/dL (ref 7–25)
CO2: 24 mmol/L (ref 20–32)
Calcium: 8.7 mg/dL (ref 8.6–10.4)
Chloride: 108 mmol/L (ref 98–110)
Creat: 1.3 mg/dL — ABNORMAL HIGH (ref 0.60–1.00)
Globulin: 2 g/dL (ref 1.9–3.7)
Glucose, Bld: 92 mg/dL (ref 65–99)
Potassium: 4.3 mmol/L (ref 3.5–5.3)
Sodium: 141 mmol/L (ref 135–146)
Total Bilirubin: 0.5 mg/dL (ref 0.2–1.2)
Total Protein: 5.9 g/dL — ABNORMAL LOW (ref 6.1–8.1)
eGFR: 42 mL/min/{1.73_m2} — ABNORMAL LOW (ref >=60)

## 2023-08-27 LAB — CBC WITH DIFFERENTIAL/PLATELET
Absolute Lymphocytes: 1638 {cells}/uL (ref 850–3900)
Absolute Monocytes: 435 {cells}/uL (ref 200–950)
Basophils Absolute: 58 {cells}/uL (ref 0–200)
Basophils Relative: 1.1 %
Eosinophils Absolute: 398 {cells}/uL (ref 15–500)
Eosinophils Relative: 7.5 %
HCT: 36.1 % (ref 35.0–45.0)
Hemoglobin: 11.4 g/dL — ABNORMAL LOW (ref 11.7–15.5)
MCH: 32.9 pg (ref 27.0–33.0)
MCHC: 31.6 g/dL — ABNORMAL LOW (ref 32.0–36.0)
MCV: 104.3 fL — ABNORMAL HIGH (ref 80.0–100.0)
MPV: 12.3 fL (ref 7.5–12.5)
Monocytes Relative: 8.2 %
Neutro Abs: 2772 {cells}/uL (ref 1500–7800)
Neutrophils Relative %: 52.3 %
Platelets: 133 10*3/uL — ABNORMAL LOW (ref 140–400)
RBC: 3.46 10*6/uL — ABNORMAL LOW (ref 3.80–5.10)
RDW: 14.3 % (ref 11.0–15.0)
Total Lymphocyte: 30.9 %
WBC: 5.3 10*3/uL (ref 3.8–10.8)

## 2023-08-27 LAB — LIPID PANEL
Cholesterol: 124 mg/dL (ref <200)
HDL: 56 mg/dL (ref >=50)
LDL Cholesterol (Calc): 51 mg/dL
Non-HDL Cholesterol (Calc): 68 mg/dL (ref <130)
Total CHOL/HDL Ratio: 2.2 (calc) (ref <5.0)
Triglycerides: 88 mg/dL (ref <150)

## 2023-08-27 LAB — HEMOGLOBIN A1C
Hgb A1c MFr Bld: 5.7 % — ABNORMAL HIGH (ref <5.7)
Mean Plasma Glucose: 117 mg/dL
eAG (mmol/L): 6.5 mmol/L

## 2023-08-27 LAB — URINE CULTURE

## 2023-08-27 LAB — TSH: TSH: 2.3 m[IU]/L (ref 0.40–4.50)

## 2023-08-27 LAB — VITAMIN D 25 HYDROXY (VIT D DEFICIENCY, FRACTURES): Vit D, 25-Hydroxy: 41 ng/mL (ref 30–100)

## 2023-09-05 ENCOUNTER — Telehealth: Payer: Self-pay | Admitting: Internal Medicine

## 2023-09-05 NOTE — Telephone Encounter (Signed)
 Patient dropped off document Handicap Placard, to be filled out by provider. Patient requested to send it back via Call Patient to pick up within 7-days. Document is located in providers tray at front office.Please advise at [facility] [phone]

## 2023-09-06 NOTE — Telephone Encounter (Signed)
Received this morning.

## 2023-09-06 NOTE — Telephone Encounter (Signed)
Form completed and placed in Dr. Lawerance Bach folder to sign.

## 2023-09-07 NOTE — Telephone Encounter (Signed)
 Spoke with patient today.  Placard mailed out today

## 2023-09-08 ENCOUNTER — Other Ambulatory Visit: Payer: Self-pay | Admitting: Internal Medicine

## 2023-11-14 ENCOUNTER — Other Ambulatory Visit: Payer: Self-pay | Admitting: Internal Medicine

## 2023-12-03 ENCOUNTER — Other Ambulatory Visit: Payer: Self-pay | Admitting: Internal Medicine

## 2023-12-06 ENCOUNTER — Ambulatory Visit
Admission: EM | Admit: 2023-12-06 | Discharge: 2023-12-06 | Disposition: A | Discharge status: Other Acute Inpatient Hospital | Attending: Family Medicine | Admitting: Family Medicine

## 2023-12-06 ENCOUNTER — Emergency Department (HOSPITAL_COMMUNITY)

## 2023-12-06 ENCOUNTER — Encounter (HOSPITAL_COMMUNITY): Payer: Self-pay

## 2023-12-06 ENCOUNTER — Inpatient Hospital Stay (HOSPITAL_COMMUNITY)
Admission: EM | Admit: 2023-12-06 | Discharge: 2023-12-09 | DRG: 194 | Disposition: A | Discharge status: Home / Self Care | Source: Ambulatory Visit | Attending: Internal Medicine | Admitting: Internal Medicine

## 2023-12-06 ENCOUNTER — Ambulatory Visit: Payer: Self-pay

## 2023-12-06 ENCOUNTER — Other Ambulatory Visit: Payer: Self-pay

## 2023-12-06 DIAGNOSIS — Z888 Allergy status to other drugs, medicaments and biological substances status: Secondary | ICD-10-CM

## 2023-12-06 DIAGNOSIS — J441 Chronic obstructive pulmonary disease with (acute) exacerbation: Secondary | ICD-10-CM | POA: Diagnosis present

## 2023-12-06 DIAGNOSIS — I428 Other cardiomyopathies: Secondary | ICD-10-CM | POA: Diagnosis present

## 2023-12-06 DIAGNOSIS — Z801 Family history of malignant neoplasm of trachea, bronchus and lung: Secondary | ICD-10-CM

## 2023-12-06 DIAGNOSIS — I10 Essential (primary) hypertension: Secondary | ICD-10-CM | POA: Diagnosis not present

## 2023-12-06 DIAGNOSIS — J181 Lobar pneumonia, unspecified organism: Secondary | ICD-10-CM | POA: Diagnosis not present

## 2023-12-06 DIAGNOSIS — N1831 Chronic kidney disease, stage 3a: Secondary | ICD-10-CM | POA: Diagnosis present

## 2023-12-06 DIAGNOSIS — I5042 Chronic combined systolic (congestive) and diastolic (congestive) heart failure: Secondary | ICD-10-CM | POA: Diagnosis not present

## 2023-12-06 DIAGNOSIS — E781 Pure hyperglyceridemia: Secondary | ICD-10-CM | POA: Diagnosis present

## 2023-12-06 DIAGNOSIS — Z8249 Family history of ischemic heart disease and other diseases of the circulatory system: Secondary | ICD-10-CM | POA: Diagnosis not present

## 2023-12-06 DIAGNOSIS — Z6832 Body mass index (BMI) 32.0-32.9, adult: Secondary | ICD-10-CM

## 2023-12-06 DIAGNOSIS — Z800 Family history of malignant neoplasm of digestive organs: Secondary | ICD-10-CM

## 2023-12-06 DIAGNOSIS — Z79899 Other long term (current) drug therapy: Secondary | ICD-10-CM

## 2023-12-06 DIAGNOSIS — I2510 Atherosclerotic heart disease of native coronary artery without angina pectoris: Secondary | ICD-10-CM | POA: Diagnosis present

## 2023-12-06 DIAGNOSIS — E860 Dehydration: Secondary | ICD-10-CM | POA: Diagnosis not present

## 2023-12-06 DIAGNOSIS — J440 Chronic obstructive pulmonary disease with acute lower respiratory infection: Secondary | ICD-10-CM | POA: Diagnosis present

## 2023-12-06 DIAGNOSIS — E039 Hypothyroidism, unspecified: Secondary | ICD-10-CM | POA: Diagnosis not present

## 2023-12-06 DIAGNOSIS — Z87891 Personal history of nicotine dependence: Secondary | ICD-10-CM | POA: Diagnosis not present

## 2023-12-06 DIAGNOSIS — Z820 Family history of epilepsy and other diseases of the nervous system: Secondary | ICD-10-CM

## 2023-12-06 DIAGNOSIS — R0902 Hypoxemia: Secondary | ICD-10-CM | POA: Diagnosis not present

## 2023-12-06 DIAGNOSIS — D509 Iron deficiency anemia, unspecified: Secondary | ICD-10-CM | POA: Diagnosis present

## 2023-12-06 DIAGNOSIS — J90 Pleural effusion, not elsewhere classified: Secondary | ICD-10-CM | POA: Diagnosis not present

## 2023-12-06 DIAGNOSIS — K219 Gastro-esophageal reflux disease without esophagitis: Secondary | ICD-10-CM | POA: Diagnosis present

## 2023-12-06 DIAGNOSIS — Z7982 Long term (current) use of aspirin: Secondary | ICD-10-CM

## 2023-12-06 DIAGNOSIS — R918 Other nonspecific abnormal finding of lung field: Secondary | ICD-10-CM | POA: Diagnosis not present

## 2023-12-06 DIAGNOSIS — J189 Pneumonia, unspecified organism: Principal | ICD-10-CM | POA: Diagnosis present

## 2023-12-06 DIAGNOSIS — E1169 Type 2 diabetes mellitus with other specified complication: Secondary | ICD-10-CM | POA: Diagnosis present

## 2023-12-06 DIAGNOSIS — Z1152 Encounter for screening for COVID-19: Secondary | ICD-10-CM | POA: Diagnosis not present

## 2023-12-06 DIAGNOSIS — J439 Emphysema, unspecified: Secondary | ICD-10-CM | POA: Diagnosis present

## 2023-12-06 DIAGNOSIS — Z8349 Family history of other endocrine, nutritional and metabolic diseases: Secondary | ICD-10-CM

## 2023-12-06 DIAGNOSIS — R5381 Other malaise: Secondary | ICD-10-CM | POA: Diagnosis present

## 2023-12-06 DIAGNOSIS — I110 Hypertensive heart disease with heart failure: Secondary | ICD-10-CM | POA: Diagnosis present

## 2023-12-06 DIAGNOSIS — E66811 Obesity, class 1: Secondary | ICD-10-CM | POA: Diagnosis present

## 2023-12-06 DIAGNOSIS — Z833 Family history of diabetes mellitus: Secondary | ICD-10-CM

## 2023-12-06 DIAGNOSIS — E1122 Type 2 diabetes mellitus with diabetic chronic kidney disease: Secondary | ICD-10-CM | POA: Diagnosis not present

## 2023-12-06 DIAGNOSIS — I130 Hypertensive heart and chronic kidney disease with heart failure and stage 1 through stage 4 chronic kidney disease, or unspecified chronic kidney disease: Secondary | ICD-10-CM | POA: Diagnosis present

## 2023-12-06 DIAGNOSIS — J449 Chronic obstructive pulmonary disease, unspecified: Secondary | ICD-10-CM | POA: Diagnosis present

## 2023-12-06 DIAGNOSIS — R0602 Shortness of breath: Secondary | ICD-10-CM | POA: Diagnosis not present

## 2023-12-06 DIAGNOSIS — M109 Gout, unspecified: Secondary | ICD-10-CM | POA: Diagnosis present

## 2023-12-06 DIAGNOSIS — Z803 Family history of malignant neoplasm of breast: Secondary | ICD-10-CM

## 2023-12-06 DIAGNOSIS — Z7951 Long term (current) use of inhaled steroids: Secondary | ICD-10-CM

## 2023-12-06 DIAGNOSIS — Z90710 Acquired absence of both cervix and uterus: Secondary | ICD-10-CM

## 2023-12-06 DIAGNOSIS — E785 Hyperlipidemia, unspecified: Secondary | ICD-10-CM | POA: Diagnosis present

## 2023-12-06 DIAGNOSIS — E1159 Type 2 diabetes mellitus with other circulatory complications: Secondary | ICD-10-CM | POA: Diagnosis present

## 2023-12-06 DIAGNOSIS — Z79890 Hormone replacement therapy (postmenopausal): Secondary | ICD-10-CM | POA: Diagnosis not present

## 2023-12-06 DIAGNOSIS — R059 Cough, unspecified: Secondary | ICD-10-CM | POA: Diagnosis not present

## 2023-12-06 DIAGNOSIS — N183 Chronic kidney disease, stage 3 unspecified: Secondary | ICD-10-CM | POA: Diagnosis present

## 2023-12-06 DIAGNOSIS — Z9884 Bariatric surgery status: Secondary | ICD-10-CM

## 2023-12-06 HISTORY — DX: Emphysema, unspecified: J43.9

## 2023-12-06 HISTORY — DX: Chronic kidney disease, unspecified: N18.9

## 2023-12-06 LAB — BASIC METABOLIC PANEL WITH GFR
Anion gap: 16 — ABNORMAL HIGH (ref 5–15)
BUN: 31 mg/dL — ABNORMAL HIGH (ref 8–23)
CO2: 21 mmol/L — ABNORMAL LOW (ref 22–32)
Calcium: 8.9 mg/dL (ref 8.9–10.3)
Chloride: 96 mmol/L — ABNORMAL LOW (ref 98–111)
Creatinine, Ser: 1.16 mg/dL — ABNORMAL HIGH (ref 0.44–1.00)
GFR, Estimated: 48 mL/min — ABNORMAL LOW (ref >60)
Glucose, Bld: 103 mg/dL — ABNORMAL HIGH (ref 70–99)
Potassium: 3.9 mmol/L (ref 3.5–5.1)
Sodium: 133 mmol/L — ABNORMAL LOW (ref 135–145)

## 2023-12-06 LAB — CBC WITH DIFFERENTIAL/PLATELET
Abs Immature Granulocytes: 0.16 K/uL — ABNORMAL HIGH (ref 0.00–0.07)
Basophils Absolute: 0 K/uL (ref 0.0–0.1)
Basophils Relative: 0 %
Eosinophils Absolute: 0 K/uL (ref 0.0–0.5)
Eosinophils Relative: 0 %
HCT: 35.7 % — ABNORMAL LOW (ref 36.0–46.0)
Hemoglobin: 11.7 g/dL — ABNORMAL LOW (ref 12.0–15.0)
Immature Granulocytes: 1 %
Lymphocytes Relative: 7 %
Lymphs Abs: 1 K/uL (ref 0.7–4.0)
MCH: 32.4 pg (ref 26.0–34.0)
MCHC: 32.8 g/dL (ref 30.0–36.0)
MCV: 98.9 fL (ref 80.0–100.0)
Monocytes Absolute: 0.8 K/uL (ref 0.1–1.0)
Monocytes Relative: 6 %
Neutro Abs: 12.4 K/uL — ABNORMAL HIGH (ref 1.7–7.7)
Neutrophils Relative %: 86 %
Platelets: 210 K/uL (ref 150–400)
RBC: 3.61 MIL/uL — ABNORMAL LOW (ref 3.87–5.11)
RDW: 14.4 % (ref 11.5–15.5)
WBC: 14.5 K/uL — ABNORMAL HIGH (ref 4.0–10.5)
nRBC: 0 % (ref 0.0–0.2)

## 2023-12-06 LAB — PROCALCITONIN: Procalcitonin: 1.74 ng/mL

## 2023-12-06 LAB — GLUCOSE, CAPILLARY: Glucose-Capillary: 243 mg/dL — ABNORMAL HIGH (ref 70–99)

## 2023-12-06 LAB — LACTIC ACID, PLASMA: Lactic Acid, Venous: 1.2 mmol/L (ref 0.5–1.9)

## 2023-12-06 LAB — RESP PANEL BY RT-PCR (RSV, FLU A&B, COVID)  RVPGX2
Influenza A by PCR: NEGATIVE
Influenza B by PCR: NEGATIVE
Resp Syncytial Virus by PCR: NEGATIVE
SARS Coronavirus 2 by RT PCR: NEGATIVE

## 2023-12-06 MED ORDER — CARVEDILOL 3.125 MG PO TABS
3.1250 mg | ORAL_TABLET | Freq: Two times a day (BID) | ORAL | Status: DC
  Administered 2023-12-07 – 2023-12-09 (×5): 3.125 mg via ORAL
  Filled 2023-12-06 (×7): qty 1

## 2023-12-06 MED ORDER — SODIUM CHLORIDE 0.9 % IV SOLN
1.0000 g | Freq: Once | INTRAVENOUS | Status: AC
  Administered 2023-12-06: 1 g via INTRAVENOUS
  Filled 2023-12-06: qty 10

## 2023-12-06 MED ORDER — OXYCODONE HCL 5 MG PO TABS: 5.0000 mg | ORAL_TABLET | ORAL | Status: DC | PRN

## 2023-12-06 MED ORDER — SODIUM CHLORIDE 0.9 % IV SOLN
INTRAVENOUS | Status: AC
Start: 2023-12-06 — End: 2023-12-07

## 2023-12-06 MED ORDER — BUDESON-GLYCOPYRROL-FORMOTEROL 160-9-4.8 MCG/ACT IN AERO
2.0000 | INHALATION_SPRAY | Freq: Two times a day (BID) | RESPIRATORY_TRACT | Status: DC
Start: 2023-12-06 — End: 2023-12-09
  Administered 2023-12-08 – 2023-12-09 (×2): 2 via RESPIRATORY_TRACT
  Filled 2023-12-06 (×2): qty 5.9

## 2023-12-06 MED ORDER — METHYLPREDNISOLONE SODIUM SUCC 125 MG IJ SOLR
125.0000 mg | Freq: Once | INTRAMUSCULAR | Status: AC
  Administered 2023-12-06: 125 mg via INTRAVENOUS
  Filled 2023-12-06: qty 2

## 2023-12-06 MED ORDER — HEPARIN SODIUM (PORCINE) 5000 UNIT/ML IJ SOLN
5000.0000 [IU] | Freq: Three times a day (TID) | INTRAMUSCULAR | Status: DC
  Administered 2023-12-06 – 2023-12-08 (×5): 5000 [IU] via SUBCUTANEOUS
  Filled 2023-12-06 (×5): qty 1

## 2023-12-06 MED ORDER — MORPHINE SULFATE (PF) 2 MG/ML IV SOLN: 2.0000 mg | INTRAVENOUS | Status: DC | PRN

## 2023-12-06 MED ORDER — ALLOPURINOL 300 MG PO TABS
300.0000 mg | ORAL_TABLET | Freq: Every day | ORAL | Status: DC
  Administered 2023-12-07 – 2023-12-09 (×3): 300 mg via ORAL
  Filled 2023-12-06 (×4): qty 1

## 2023-12-06 MED ORDER — SODIUM CHLORIDE 0.9 % IV SOLN
2.0000 g | INTRAVENOUS | Status: DC
  Administered 2023-12-07 – 2023-12-09 (×3): 2 g via INTRAVENOUS
  Filled 2023-12-06 (×3): qty 20

## 2023-12-06 MED ORDER — ATORVASTATIN CALCIUM 40 MG PO TABS
40.0000 mg | ORAL_TABLET | Freq: Every day | ORAL | Status: DC
  Administered 2023-12-07 – 2023-12-09 (×3): 40 mg via ORAL
  Filled 2023-12-06 (×4): qty 1

## 2023-12-06 MED ORDER — SODIUM CHLORIDE 0.9 % IV SOLN: 1.0000 g | Freq: Once | INTRAVENOUS | Status: DC

## 2023-12-06 MED ORDER — IPRATROPIUM-ALBUTEROL 0.5-2.5 (3) MG/3ML IN SOLN
3.0000 mL | Freq: Once | RESPIRATORY_TRACT | Status: AC
  Administered 2023-12-06: 3 mL via RESPIRATORY_TRACT
  Filled 2023-12-06: qty 18

## 2023-12-06 MED ORDER — MELATONIN 5 MG PO TABS
5.0000 mg | ORAL_TABLET | Freq: Once | ORAL | Status: AC
  Administered 2023-12-06: 5 mg via ORAL
  Filled 2023-12-06: qty 1

## 2023-12-06 MED ORDER — SODIUM CHLORIDE 0.9 % IV SOLN
500.0000 mg | Freq: Once | INTRAVENOUS | Status: AC
  Administered 2023-12-06: 500 mg via INTRAVENOUS
  Filled 2023-12-06: qty 5

## 2023-12-06 MED ORDER — SODIUM CHLORIDE 0.9 % IV BOLUS
1000.0000 mL | Freq: Once | INTRAVENOUS | Status: AC
  Administered 2023-12-06: 1000 mL via INTRAVENOUS

## 2023-12-06 MED ORDER — ONDANSETRON HCL 4 MG/2ML IJ SOLN: 4.0000 mg | Freq: Four times a day (QID) | INTRAMUSCULAR | Status: DC | PRN

## 2023-12-06 MED ORDER — GABAPENTIN 100 MG PO CAPS
200.0000 mg | ORAL_CAPSULE | Freq: Two times a day (BID) | ORAL | Status: DC
  Administered 2023-12-06 – 2023-12-09 (×6): 200 mg via ORAL
  Filled 2023-12-06 (×8): qty 2

## 2023-12-06 MED ORDER — ONDANSETRON HCL 4 MG PO TABS: 4.0000 mg | ORAL_TABLET | Freq: Four times a day (QID) | ORAL | Status: DC | PRN

## 2023-12-06 MED ORDER — INSULIN ASPART 100 UNIT/ML IJ SOLN
0.0000 [IU] | Freq: Three times a day (TID) | INTRAMUSCULAR | Status: DC
  Administered 2023-12-07 (×3): 2 [IU] via SUBCUTANEOUS
  Administered 2023-12-08: 1 [IU] via SUBCUTANEOUS
  Filled 2023-12-06: qty 0.09

## 2023-12-06 MED ORDER — PANTOPRAZOLE SODIUM 40 MG PO TBEC
40.0000 mg | DELAYED_RELEASE_TABLET | Freq: Every day | ORAL | Status: DC
  Administered 2023-12-06 – 2023-12-09 (×4): 40 mg via ORAL
  Filled 2023-12-06 (×5): qty 1

## 2023-12-06 MED ORDER — LEVOTHYROXINE SODIUM 50 MCG PO TABS
50.0000 ug | ORAL_TABLET | Freq: Every day | ORAL | Status: DC
  Administered 2023-12-07 – 2023-12-09 (×3): 50 ug via ORAL
  Filled 2023-12-06 (×4): qty 1

## 2023-12-06 MED ORDER — POLYETHYLENE GLYCOL 3350 17 G PO PACK
17.0000 g | PACK | Freq: Every day | ORAL | Status: DC
  Filled 2023-12-06 (×2): qty 1

## 2023-12-06 MED ORDER — ASPIRIN 81 MG PO TBEC
81.0000 mg | DELAYED_RELEASE_TABLET | Freq: Every day | ORAL | Status: DC
  Administered 2023-12-07 – 2023-12-09 (×3): 81 mg via ORAL
  Filled 2023-12-06 (×4): qty 1

## 2023-12-06 MED ORDER — IPRATROPIUM-ALBUTEROL 0.5-2.5 (3) MG/3ML IN SOLN
3.0000 mL | Freq: Four times a day (QID) | RESPIRATORY_TRACT | Status: DC
  Administered 2023-12-06 – 2023-12-07 (×3): 3 mL via RESPIRATORY_TRACT
  Filled 2023-12-06 (×3): qty 3

## 2023-12-06 MED ORDER — ORAL CARE MOUTH RINSE: 15.0000 mL | OROMUCOSAL | Status: DC | PRN

## 2023-12-06 MED ORDER — GUAIFENESIN ER 600 MG PO TB12
600.0000 mg | ORAL_TABLET | Freq: Two times a day (BID) | ORAL | Status: DC
  Administered 2023-12-06 – 2023-12-09 (×6): 600 mg via ORAL
  Filled 2023-12-06 (×8): qty 1

## 2023-12-06 MED ORDER — ACETAMINOPHEN 325 MG PO TABS
650.0000 mg | ORAL_TABLET | Freq: Four times a day (QID) | ORAL | Status: DC | PRN
  Administered 2023-12-08: 650 mg via ORAL
  Filled 2023-12-06 (×5): qty 2

## 2023-12-06 MED ORDER — PREDNISONE 20 MG PO TABS
40.0000 mg | ORAL_TABLET | Freq: Every day | ORAL | Status: DC
  Administered 2023-12-07 – 2023-12-09 (×3): 40 mg via ORAL
  Filled 2023-12-06 (×4): qty 2

## 2023-12-06 MED ORDER — AZITHROMYCIN 500 MG PO TABS
500.0000 mg | ORAL_TABLET | Freq: Every day | ORAL | Status: DC
  Administered 2023-12-07 – 2023-12-08 (×2): 500 mg via ORAL
  Filled 2023-12-06 (×4): qty 1

## 2023-12-06 NOTE — H&P (Addendum)
 History and Physical    April Combs FMW:[id number] DOB: Mar 27, 1945 DOA: 12/06/2023  PCP: Geofm Glade PARAS, MD   Patient coming from: Home    Chief Complaint: Shortness of breath, wheezing, cough  HPI: April Combs is a 78 y.o. female with medical history significant of with history of COPD,prior tobacco use, hypertension, hyperlipidemia, coronary artery disease, HFmrEF, hypothyroidism who presented to the Emergency Department from home with complaint of shortness of breath.  Symptoms started about a week ago, worsened today.  Report of feeling lightheaded and weakness at home. She became dyspnea on minimal exertion.  Also reported sore throat, cough, chills,wheezing, dizziness .  She also reported a fever of 102 Fahrenheit last weekend.  She was bringing up phlegm which was greenish/brownish in color. Due to worsening of her symptoms, patient was seen at urgent care earlier this morning and was found to be tachypneic and hypoxic.  She was then recommended to go to the emergency department. On presentation, she was in sinus tachycardia with a stable blood pressure.  She appeared short of breath.  She was wheezing. Patient seen and examined at bedside in the emergency department.  During my evaluation, she was overall comfortable, lying in bed.  EKG monitor showed sinus tachycardia with a stable blood pressure.  She was on room air , speaking in full sentences.  She says she feels better after she was given breathing treatment and antibiotics. Patient denies any chest pain, abdomen pain, dysuria, chills, vomiting, diarrhea, loss of consciousness. She lives alone and ambulates without any problem.   ED Course: Lab work showed leukocytosis in the range of 14k , creatinine of 1.1.  Also appeared dehydrated and was given IV fluid.  She was maintaining her saturation on room air.  Chest x-ray showed faint patchy opacities in the bilateral lower lungs, right greater than left.  Patient admitted  for the management of multifocal pneumonia.  Started on broad spectrum antibiotics.  Blood culture sent.  COVID/flu/RSV negative. She was treated with a dose of azithromycin , ceftriaxone  and Solu-Medrol .  Patient was requested for admission of multifocal pneumonia.   Review of Systems: As per HPI otherwise 10 point review of systems negative.    Past Medical History:  Diagnosis Date   Anemia    Anxiety    BRBPR (bright red blood per rectum) 02/05/2015   Chronic kidney disease    Coronary artery disease (CAD) excluded 01/2014   Low Risk Myoview  (false positive) with possible inferior-inferolateral ischemia, but with nonsustained VT --> CATH --> Angiographically Normal Coronary Arteries; coronary CT angiogram showed mild approximately disease but otherwise no obstructive disease.  Coronary calcium  score 40.   Depression    Emphysema of lung (HCC)    FALSE POSITIVE NUCLEAR STRESS CHEST 01/28/2014   Fungal dermatitis 09/29/2011   GERD (gastroesophageal reflux disease)    Gout 04/10/2017   First episode - Left foot 03/2017   Hyperlipidemia    Hyperlipidemia with target LDL less than 100 09/29/2011   Hyperplastic colon polyp    Hypertension    Hypertensive 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 (HCC) 04/26/2017   HYPERTRIGLYCERIDEMIA 07/12/2008   Qualifier: Diagnosis of  By: Mahlon MD, Katherine     Hypothyroidism 02/09/2017   Irregular heart beat 09/29/2011   Lumbar radiculopathy 12/24/2014   Morbid obesity (HCC) 10/15/2015   NICM (nonischemic cardiomyopathy) (HCC)    Essentially resolved. Echocardiogram July 2013 showed normal EF greater than 55%. Important septal motion. Normal LV filling pressures.  Mild MR and mild anterior MVP   Obesity (BMI 30-39.9)    Osteoarthritis    Positional vertigo    Prediabetes 08/10/2017   Pulmonary emphysema (HCC) 04/29/2015   PFTs 2017 - moderate-severe obstructive disease, some restrictive disease   SOB (shortness of breath)  08/23/2011    Past Surgical History:  Procedure Laterality Date   ABDOMINAL HYSTERECTOMY     APPENDECTOMY  as child   BREAST BIOPSY Right    benign   CHOLECYSTECTOMY     COLONOSCOPY     CORONARY CALCIUM  SCORE/CT ANGIOGRAM  07/2017   Calcium  score 40.  Mild LAD stenosis, but no other significant disease.   DOPPLER ECHOCARDIOGRAPHY  July 2013   09/20/11. normal Lv thickness and function with EF greater thatn 55%   Exercise tolerance test - CPET-MET-TEST  July 2013   Submaximal effort. Only 80% of her, therefore peak VO2 of 75% is likely an underestimate. High resting heart rate, achieving 86% of heart rate. --> Unable to interpret due to lack of effort.   GASTRIC BYPASS  1980   GASTRIC BYPASS     KIDNEY STONE SURGERY     LEFT HEART CATHETERIZATION WITH CORONARY ANGIOGRAM N/A 02/01/2014   Procedure: LEFT HEART CATHETERIZATION WITH CORONARY ANGIOGRAM;  Surgeon: Alm LELON Clay, MD;  Location: [facility] CATH LAB;  Service: Cardiovascular: Angiographically normal coronaries   NM MYOVIEW  LTD  November 2015   4:20 min, 4.6 METS --> DOE, but no chest discomfort; Significant + ST -T wave changes noted with NSVT & PVCs. Images suggest Inferior-inferolateral ischemia.  Low Risk. -- FALSE POSITIVE   Pulmonary Function Tests  July 2013   Increased densities, decreased FVC - consistent with obesity hypoventilation; FEV1 is 58%, FVC 60% of predicted.   TRANSTHORACIC ECHOCARDIOGRAM  04/2017   Mildly reduced EF of 45%.  No regional wall motion normality.  GR 1 DD   UPPER GASTROINTESTINAL ENDOSCOPY       reports that she quit smoking about 20 years ago. Her smoking use included cigarettes. She started smoking about 50 years ago. She has a 30 pack-year smoking history. She has never used smokeless tobacco. She reports current alcohol use of about 3.0 standard drinks of alcohol per week. She reports that she does not use drugs.  Allergies  Allergen Reactions   Semaglutide  Diarrhea    Rybelsus  - nausea and  diarrhea    Family History  Problem Relation Age of Onset   Kidney disease Daughter    Multiple sclerosis Daughter    Heart disease Father    Heart disease Mother    Thyroid  disease Mother    Heart disease Paternal Grandfather    Colon cancer Other        early 59's.  Genetic testing from maternal side of family.   Colon cancer Maternal Aunt    Breast cancer Maternal Aunt    Lung cancer Sister        smoker   Diabetes Sister    Colon cancer Maternal Uncle    Diabetes Daughter        x3   Heart disease Maternal Aunt    Colon polyps Neg Hx    Esophageal cancer Neg Hx    Gallbladder disease Neg Hx    Rectal cancer Neg Hx    Stomach cancer Neg Hx      Prior to Admission medications   Medication Sig Start Date End Date Taking? Authorizing Provider  acetaminophen  (TYLENOL ) 325 MG tablet Take 2 tablets (650  mg total) by mouth every 6 (six) hours as needed for mild pain (or Fever >/= 101). 11/01/20   Fairy Frames, MD  albuterol  (PROVENTIL ) (2.5 MG/3ML) 0.083% nebulizer solution USE 1 VIAL IN NEBULIZER EVERY 6 HOURS AS NEEDED FOR WHEEZING FOR SHORTNESS OF BREATH Patient taking differently: Take 2.5 mg by nebulization in the morning and at bedtime. 06/14/22   Geofm Glade PARAS, MD  albuterol  (VENTOLIN  HFA) 108 (90 Base) MCG/ACT inhaler Inhale 2 puffs into the lungs every 6 (six) hours as needed for wheezing or shortness of breath. 08/25/22   Geofm Glade PARAS, MD  allopurinol  (ZYLOPRIM ) 300 MG tablet TAKE 1 TABLET BY MOUTH DAILY 09/09/23   Geofm Glade PARAS, MD  aspirin  EC 81 MG tablet Take 1 tablet (81 mg total) by mouth daily. Swallow whole. 07/01/22   Cherlyn Labella, MD  atorvastatin  (LIPITOR) 40 MG tablet Take 1 tablet (40 mg total) by mouth daily. 03/23/23   Anner Alm ORN, MD  carvedilol  (COREG ) 12.5 MG tablet Take 1.5 tablets (18.75 mg total) by mouth 2 (two) times daily. 03/23/23   Anner Alm ORN, MD  conjugated estrogens  (PREMARIN ) vaginal cream Place 1 Applicatorful vaginally 2 (two) times  a week. 08/25/23   Geofm Glade PARAS, MD  Fluticasone -Umeclidin-Vilant (TRELEGY ELLIPTA ) 100-62.5-25 MCG/ACT AEPB Inhale 1 puff into the lungs daily. Plans to start bevespi  after completing rx for trelegy 02/24/23   Geofm Glade PARAS, MD  furosemide  (LASIX ) 20 MG tablet Take 1 tablet (20 mg total) by mouth daily as needed. 03/23/23   Anner Alm ORN, MD  gabapentin  (NEURONTIN ) 100 MG capsule TAKE 2 CAPSULES BY MOUTH IN THE  MORNING AND 3 CAPSULES BY MOUTH  AT BEDTIME 09/09/23   Geofm Glade PARAS, MD  guaiFENesin  (MUCINEX ) 600 MG 12 hr tablet Take 1 tablet (600 mg total) by mouth 2 (two) times daily. 06/30/22   Cherlyn Labella, MD  levothyroxine  (SYNTHROID ) 50 MCG tablet Take 1 tablet by mouth once daily 12/05/23   Geofm Glade PARAS, MD  lisinopril -hydrochlorothiazide  (ZESTORETIC ) 20-25 MG tablet Take 1 tablet by mouth daily. 03/23/23   Anner Alm ORN, MD  omeprazole  (PRILOSEC) 20 MG capsule Take 1 capsule (20 mg total) by mouth daily. TAKE 1 CAPSULE BY MOUTH  TWICE DAILY BEFORE A MEAL 08/25/22   Geofm Glade PARAS, MD  RYBELSUS  7 MG TABS Take 1 tablet by mouth once daily 11/15/23   Geofm Glade PARAS, MD    Physical Exam: Vitals:   12/06/23 1403 12/06/23 1404 12/06/23 1610 12/06/23 1615  BP: 134/69  (!) 121/55 127/72  Pulse: (!) 111  (!) 104 98  Resp: (!) 32  16 (!) 24  Temp: 98.5 F (36.9 C)  98.3 F (36.8 C)   TempSrc: Oral  Oral   SpO2: 94%  99% 98%  Weight:  88.5 kg    Height:  5' 5 (1.651 m)      Constitutional: Comfortable, lying on bed, pleasant female Vitals:   12/06/23 1403 12/06/23 1404 12/06/23 1610 12/06/23 1615  BP: 134/69  (!) 121/55 127/72  Pulse: (!) 111  (!) 104 98  Resp: (!) 32  16 (!) 24  Temp: 98.5 F (36.9 C)  98.3 F (36.8 C)   TempSrc: Oral  Oral   SpO2: 94%  99% 98%  Weight:  88.5 kg    Height:  5' 5 (1.651 m)     Eyes: PERRL, lids and conjunctivae normal ENMT: Mucous membranes are moist.  Neck: normal, supple, no masses, no thyromegaly Respiratory:  Lungs are mostly clear on  auscultation, no frank wheezing, no crackles. Normal respiratory effort. No accessory muscle use.  Cardiovascular: Sinus tachycardia, no murmurs / rubs / gallops. No extremity edema.  Abdomen: no tenderness, no masses palpated. No hepatosplenomegaly. Bowel sounds positive.  Musculoskeletal: no clubbing / cyanosis. No joint deformity upper and lower extremities.  Skin: no rashes, lesions, ulcers. No induration Neurologic: CN 2-12 grossly intact.  Strength 5/5 in all 4.  Psychiatric: Normal judgment and insight. Alert and oriented x 3. Normal mood.   Foley Catheter:None  Labs on Admission: I have personally reviewed following labs and imaging studies  CBC: Recent Labs  Lab 12/06/23 1417  WBC 14.5*  NEUTROABS 12.4*  HGB 11.7*  HCT 35.7*  MCV 98.9  PLT 210   Basic Metabolic Panel: Recent Labs  Lab 12/06/23 1417  NA 133*  K 3.9  CL 96*  CO2 21*  GLUCOSE 103*  BUN 31*  CREATININE 1.16*  CALCIUM  8.9   GFR: Estimated Creatinine Clearance: 44.6 mL/min (A) (by C-G formula based on SCr of 1.16 mg/dL (H)). Liver Function Tests: No results for input(s): AST, ALT, ALKPHOS, BILITOT, PROT, ALBUMIN in the last 168 hours. No results for input(s): LIPASE, AMYLASE in the last 168 hours. No results for input(s): AMMONIA in the last 168 hours. Coagulation Profile: No results for input(s): INR, PROTIME in the last 168 hours. Cardiac Enzymes: No results for input(s): CKTOTAL, CKMB, CKMBINDEX, TROPONINI in the last 168 hours. BNP (last 3 results) No results for input(s): PROBNP in the last 8760 hours. HbA1C: No results for input(s): HGBA1C in the last 72 hours. CBG: No results for input(s): GLUCAP in the last 168 hours. Lipid Profile: No results for input(s): CHOL, HDL, LDLCALC, TRIG, CHOLHDL, LDLDIRECT in the last 72 hours. Thyroid  Function Tests: No results for input(s): TSH, T4TOTAL, FREET4, T3FREE, THYROIDAB in the last 72  hours. Anemia Panel: No results for input(s): VITAMINB12, FOLATE, FERRITIN, TIBC, IRON, RETICCTPCT in the last 72 hours. Urine analysis:    Component Value Date/Time   BILIRUBINUR negative 08/25/2023 0810   PROTEINUR Negative 08/25/2023 0810   UROBILINOGEN negative (A) 08/25/2023 0810   NITRITE negative 08/25/2023 0810   LEUKOCYTESUR Large (3+) (A) 08/25/2023 0810    Radiological Exams on Admission: DG Chest 2 View Result Date: 12/06/2023 CLINICAL DATA:  Cough. EXAM: DG CHEST 2V COMPARISON:  Chest x-ray 06/27/2022 FINDINGS: There some faint patchy opacities in the bilateral lower lungs, right greater than left. The left lower lung opacity is slightly nodular. There is a small left pleural effusion. There is no pneumothorax. No acute fractures are seen. There surgical clips in the upper abdomen. IMPRESSION: 1. Faint patchy opacities in the bilateral lower lungs, right greater than left. The left lower lung opacity is slightly nodular. Findings are concerning for infection. Follow-up chest x-ray recommended in 4-6 weeks to confirm resolution. 2. Small left pleural effusion. Electronically Signed   By: Greig Pique M.D.   On: 12/06/2023 15:11     Assessment/Plan Principal Problem:   Multifocal pneumonia Active Problems:   CAD (coronary artery disease)   Essential hypertension   Hypothyroidism   Hyperlipidemia with target LDL less than 100   Pulmonary emphysema (HCC)   Hypertensive 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 (HCC)   CKD (chronic kidney disease) stage 3, GFR 30-59 ml/min (HCC)   Chronic combined systolic and diastolic heart failure (HCC)  Multifocal pneumonia: Presented with shortness of breath, wheezing, productive cough and fever.  Mild  leukocytosis.  Maintaining saturation on room air.  Lactic acid level normal.  Chest x-ray  shows multifocal pneumonia. Continue broad-spectrum antibiotics with azithromycin  and ceftriaxone . Will check  procalcitonin, Legionella, urine streptococcal antigen.  Follow-up blood cultures.  Will send sputum culture.   COPD exacerbation: Follows with pulmonology at [facility].  History of prior tobacco use.  Has history of pulm nodules.  On Trelegy at home.  Continue bronchodilators as needed.  Given a dose of Solu-Medrol  daily.  Continue prednisone  for 4 more days to finish the course.   Chronic combined systolic/diastolic CHF: Last echo done on 06/2022 showed EF of 45%, grade 1 diastolic dysfunction.  She follows with cardiology.  Takes Coreg , Lasix  at home.  Lasix  will be held because he appeared dehydrated on presentation.  Will resume low-dose Coreg    Hypertension: Currently stable.  Takes coreg ,lisinopril  - hydrochlorothiazide  at home.  Currently normotensive.  Coreg  resumed at low-dose.   Hypothyroidism: Continue Synthyroid   Hyperlipidemia: Continue statin  History of coronary artery disease: No anginal symptoms.  Takes aspirin , statin , Coreg  at home.  Being continued   History of iron deficiency anemia: Follows with hematology, Dr. Sherrod.  Currently hemoglobin stable  CKD stage IIIa: Baseline creatinine around 1.3.  Currently kidney function at baseline  GERD: Continue PPI  History of gout: Takes allopurinol  at home  Diabetes type 2: Recent A1c of 5.7.  Currently on sliding scale.  Takes semaglutide  at home  Deconditioning/debility: Ambulates without any difficulty at baseline.  Was dizzy at home.  Will consult PT   Obesity: BMI of 32.4      Severity of Illness: The appropriate patient status for this patient is INPATIENT. Inpatient status is judged to be reasonable and necessary in order to provide the required intensity of service to ensure the patient's safety. The patient's presenting symptoms, physical exam findings, and initial radiographic and laboratory data in the context of their chronic comorbidities is felt to place them at high risk for further clinical  deterioration. Furthermore, it is not anticipated that the patient will be medically stable for discharge from the hospital within 2 midnights of admission.   * I certify that at the point of admission it is my clinical judgment that the patient will require inpatient hospital care spanning beyond 2 midnights from the point of admission due to high intensity of service, high risk for further deterioration and high frequency of surveillance required.*   DVT prophylaxis: Heparin  [place] Code Status: Full Family Communication: Called and discussed with daughter Shona on phone Consults called: None     Ivonne Mustache MD [facility]  12/06/2023, 6:18 PM

## 2023-12-06 NOTE — ED Notes (Signed)
 Blue top was drawn along with basics

## 2023-12-06 NOTE — ED Triage Notes (Signed)
 Here with Family. Patient reports being sick for over a week, cough, ha, fever up to 102.7 on Sunday (only over the weekend) and occasional dizziness (not currently, but moving, yes).

## 2023-12-06 NOTE — Telephone Encounter (Signed)
 FYI Only or Action Required?: FYI only for provider.  Patient was last seen in primary care on 08/25/2023 by Geofm Glade PARAS, MD.  Called Nurse Triage reporting Cough.  Symptoms began several days ago.  Interventions attempted: OTC medications: Tylenol  and Rest, hydration, or home remedies.  Symptoms are: unchanged.  Triage Disposition: See HCP Within 4 Hours (Or PCP Triage)  Patient/caregiver understands and will follow disposition?: Yes   Copied from CRM #[id number]. Topic: Clinical - Red Word Triage >> Dec 06, 2023 10:20 AM [place] A wrote: [facility] that prompted transfer to Nurse Triage: Patient has been sick the entire weekend-headache, coughing, dizzy and fever 102.7 over the weekend. Reason for Disposition  MILD difficulty breathing (e.g., minimal/no SOB at rest, SOB with walking, pulse < 100)  Answer Assessment - Initial Assessment Questions Additional info: No acute sdv available today, patient will proceed to urgent care.    1. SYMPTOMS: What is your main symptom or concern? (e.g., cough, fever, shortness of breath, muscle aches)     Cough choking on sputum 2. ONSET: When did the symptoms start?      Saturday 3. COUGH: Do you have a cough? If Yes, ask: How bad is the cough?       Mild, green and light brown 4. FEVER: Do you have a fever? If Yes, ask: What is your temperature, how was it measured, and when did it start?     Sunday 102.7, today afebrile 5. BREATHING DIFFICULTY: Are you having any difficulty breathing? (e.g., normal; shortness of breath, wheezing, unable to speak)      denies 6. BETTER-SAME-WORSE: Are you getting better, staying the same or getting worse compared to yesterday?  If getting worse, ask, In what way?     same 7. OTHER SYMPTOMS: Do you have any other symptoms?  (e.g., chills, fatigue, headache, loss of smell or taste, muscle pain, sore throat)     Intermittent dizziness, malaise, headache  Protocols used: Influenza (Flu)  Suspected-A-AH

## 2023-12-06 NOTE — ED Notes (Signed)
 Pt oxygen 90% after walking to triage room, went to 94% after a minute of rest

## 2023-12-06 NOTE — ED Notes (Signed)
 Patient is being discharged from the Urgent Care and sent to the Emergency Department via POV . Per PB, patient is in need of higher level of care due to SOB/cough. Patient is aware and verbalizes understanding of plan of care.  Vitals:   12/06/23 1308 12/06/23 1311  BP: (!) 97/59   Pulse: (!) 112 (!) 108  Resp: (!) 42 (!) 40  Temp: 97.9 F (36.6 C)   SpO2:  91%

## 2023-12-06 NOTE — ED Provider Triage Note (Signed)
 Emergency Medicine Provider Triage Evaluation Note  April Combs , a 78 y.o. female  was evaluated in triage.  Pt complains of cough, DOE, and fatigue over the past 1 weeks. Endorses fever at home around 102F this weekend. Denies chest pain, nausea, vomiting.  Review of Systems  Positive:  Negative:   Physical Exam  BP 134/69   Pulse (!) 111   Temp 98.5 F (36.9 C) (Oral)   Resp (!) 32   Ht 5' 5 (1.651 m)   Wt 88.5 kg   SpO2 94%   BMI 32.45 kg/m  Gen:   Awake, no distress   Resp:  Normal effort  MSK:   Moves extremities without difficulty  Other:    Medical Decision Making  Medically screening exam initiated at 2:34 PM.  Appropriate orders placed.  April Combs was informed that the remainder of the evaluation will be completed by another provider, this initial triage assessment does not replace that evaluation, and the importance of remaining in the ED until their evaluation is complete.    April Combs, [place] 12/06/23 [phone]

## 2023-12-06 NOTE — Discharge Instructions (Signed)
 She will go to the emergency room with her daughter driving

## 2023-12-06 NOTE — ED Provider Notes (Signed)
 [facility]    CSN: [id number] Arrival date & time: 12/06/23  1157      History   Chief Complaint Chief Complaint  Patient presents with   Cough    HPI April Combs is a 78 y.o. female.    Cough  Here for cough and shortness of breath and dizziness.  Symptoms began last week.  The dizziness has come and gone and today she states she just feels dizzy just sitting.  No syncope.  No fever.  She does have COPD and is experiencing wheezing and shortness of breath.  She is allergic to semaglutide    Past Medical History:  Diagnosis Date   Anemia    Anxiety    BRBPR (bright red blood per rectum) 02/05/2015   Chronic kidney disease    Coronary artery disease (CAD) excluded 01/2014   Low Risk Myoview  (false positive) with possible inferior-inferolateral ischemia, but with nonsustained VT --> CATH --> Angiographically Normal Coronary Arteries; coronary CT angiogram showed mild approximately disease but otherwise no obstructive disease.  Coronary calcium  score 40.   Depression    Emphysema of lung (HCC)    FALSE POSITIVE NUCLEAR STRESS CHEST 01/28/2014   Fungal dermatitis 09/29/2011   GERD (gastroesophageal reflux disease)    Gout 04/10/2017   First episode - Left foot 03/2017   Hyperlipidemia    Hyperlipidemia with target LDL less than 100 09/29/2011   Hyperplastic colon polyp    Hypertension    Hypertensive 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 (HCC) 04/26/2017   HYPERTRIGLYCERIDEMIA 07/12/2008   Qualifier: Diagnosis of  By: Mahlon MD, Katherine     Hypothyroidism 02/09/2017   Irregular heart beat 09/29/2011   Lumbar radiculopathy 12/24/2014   Morbid obesity (HCC) 10/15/2015   NICM (nonischemic cardiomyopathy) (HCC)    Essentially resolved. Echocardiogram July 2013 showed normal EF greater than 55%. Important septal motion. Normal LV filling pressures. 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   CAD (coronary artery disease) 06/27/2022   Normocytic anemia 06/27/2022   Hypocalcemia 08/20/2021   Diarrhea 04/13/2021   B12 deficiency 02/22/2021   Hair loss 02/19/2021   Tingling of face 02/19/2021   CKD (chronic kidney disease) stage 3, GFR 30-59 ml/min (HCC) 02/18/2021   COPD (chronic obstructive pulmonary disease) (HCC) 10/28/2020   Chronic heart failure with preserved ejection fraction (HFpEF) (HCC) 10/28/2020   Pneumonia due to COVID-19 virus 10/27/2020   Atherosclerosis of aorta 12/31/2019   Physical deconditioning 08/18/2018   Bilateral leg weakness 02/15/2018   Diabetes (HCC) 08/10/2017   Hypertensive 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 (HCC) 04/26/2017   Gout 04/10/2017   Hypothyroidism 02/09/2017   Obesity (BMI 30.0-34.9) 10/15/2015   Pulmonary emphysema (HCC) 04/29/2015   Lumbar radiculopathy 12/24/2014   Hx of tobacco use, presenting hazards to health 09/29/2014   Nail fungus 03/11/2014   FALSE POSITIVE NUCLEAR STRESS CHEST 01/28/2014   Chest pain on exertion 01/14/2014  GERD (gastroesophageal reflux disease) 05/15/2012   Positional vertigo 12/22/2011   Irregular heart beat 09/29/2011   Hyperlipidemia with target LDL less than 100 09/29/2011   SOB (shortness of breath) 08/23/2011   Essential hypertension 05/28/2008   OSTEOARTHRITIS 05/28/2008    Past Surgical History:  Procedure Laterality Date   ABDOMINAL HYSTERECTOMY     APPENDECTOMY  as child    BREAST BIOPSY Right    benign   CHOLECYSTECTOMY     COLONOSCOPY     CORONARY CALCIUM  SCORE/CT ANGIOGRAM  07/2017   Calcium  score 40.  Mild LAD stenosis, but no other significant disease.   DOPPLER ECHOCARDIOGRAPHY  July 2013   09/20/11. normal Lv thickness and function with EF greater thatn 55%   Exercise tolerance test - CPET-MET-TEST  July 2013   Submaximal effort. Only 80% of her, therefore peak VO2 of 75% is likely an underestimate. High resting heart rate, achieving 86% of heart rate. --> Unable to interpret due to lack of effort.   GASTRIC BYPASS  1980   GASTRIC BYPASS     KIDNEY STONE SURGERY     LEFT HEART CATHETERIZATION WITH CORONARY ANGIOGRAM N/A 02/01/2014   Procedure: LEFT HEART CATHETERIZATION WITH CORONARY ANGIOGRAM;  Surgeon: Alm LELON Clay, MD;  Location: [facility] CATH LAB;  Service: Cardiovascular: Angiographically normal coronaries   NM MYOVIEW  LTD  November 2015   4:20 min, 4.6 METS --> DOE, but no chest discomfort; Significant + ST -T wave changes noted with NSVT & PVCs. Images suggest Inferior-inferolateral ischemia.  Low Risk. -- FALSE POSITIVE   Pulmonary Function Tests  July 2013   Increased densities, decreased FVC - consistent with obesity hypoventilation; FEV1 is 58%, FVC 60% of predicted.   TRANSTHORACIC ECHOCARDIOGRAM  04/2017   Mildly reduced EF of 45%.  No regional wall motion normality.  GR 1 DD   UPPER GASTROINTESTINAL ENDOSCOPY      OB History   No obstetric history on file.      Home Medications    Prior to Admission medications   Medication Sig Start Date End Date Taking? Authorizing Provider  albuterol  (VENTOLIN  HFA) 108 (90 Base) MCG/ACT inhaler Inhale 2 puffs into the lungs every 6 (six) hours as needed for wheezing or shortness of breath. 08/25/22  Yes Burns, Glade PARAS, MD  acetaminophen  (TYLENOL ) 325 MG tablet Take 2 tablets (650 mg total) by mouth every 6 (six) hours as needed for mild pain (or Fever >/= 101). 11/01/20   Fairy Frames, MD   albuterol  (PROVENTIL ) (2.5 MG/3ML) 0.083% nebulizer solution USE 1 VIAL IN NEBULIZER EVERY 6 HOURS AS NEEDED FOR WHEEZING FOR SHORTNESS OF BREATH Patient taking differently: Take 2.5 mg by nebulization in the morning and at bedtime. 06/14/22   Geofm Glade PARAS, MD  allopurinol  (ZYLOPRIM ) 300 MG tablet TAKE 1 TABLET BY MOUTH DAILY 09/09/23   Geofm Glade PARAS, MD  aspirin  EC 81 MG tablet Take 1 tablet (81 mg total) by mouth daily. Swallow whole. 07/01/22   Cherlyn Labella, MD  atorvastatin  (LIPITOR) 40 MG tablet Take 1 tablet (40 mg total) by mouth daily. 03/23/23   Clay Alm LELON, MD  carvedilol  (COREG ) 12.5 MG tablet Take 1.5 tablets (18.75 mg total) by mouth 2 (two) times daily. 03/23/23   Clay Alm LELON, MD  conjugated estrogens  (PREMARIN ) vaginal cream Place 1 Applicatorful vaginally 2 (two) times a week. 08/25/23   Geofm Glade PARAS, MD  Fluticasone -Umeclidin-Vilant (TRELEGY ELLIPTA ) 100-62.5-25 MCG/ACT AEPB Inhale 1 puff into the lungs daily. Plans to  start bevespi  after completing rx for trelegy 02/24/23   Geofm Glade PARAS, MD  furosemide  (LASIX ) 20 MG tablet Take 1 tablet (20 mg total) by mouth daily as needed. 03/23/23   Anner Alm ORN, MD  gabapentin  (NEURONTIN ) 100 MG capsule TAKE 2 CAPSULES BY MOUTH IN THE  MORNING AND 3 CAPSULES BY MOUTH  AT BEDTIME 09/09/23   Geofm Glade PARAS, MD  guaiFENesin  (MUCINEX ) 600 MG 12 hr tablet Take 1 tablet (600 mg total) by mouth 2 (two) times daily. 06/30/22   Akula, Vijaya, MD  levothyroxine  (SYNTHROID ) 50 MCG tablet Take 1 tablet by mouth once daily 12/05/23   Geofm Glade PARAS, MD  lisinopril -hydrochlorothiazide  (ZESTORETIC ) 20-25 MG tablet Take 1 tablet by mouth daily. 03/23/23   Anner Alm ORN, MD  omeprazole  (PRILOSEC) 20 MG capsule Take 1 capsule (20 mg total) by mouth daily. TAKE 1 CAPSULE BY MOUTH  TWICE DAILY BEFORE A MEAL 08/25/22   Geofm Glade PARAS, MD  RYBELSUS  7 MG TABS Take 1 tablet by mouth once daily 11/15/23   Geofm Glade PARAS, MD    Family History Family History   Problem Relation Age of Onset   Kidney disease Daughter    Multiple sclerosis Daughter    Heart disease Father    Heart disease Mother    Thyroid  disease Mother    Heart disease Paternal Grandfather    Colon cancer Other        early 34's.  Genetic testing from maternal side of family.   Colon cancer Maternal Aunt    Breast cancer Maternal Aunt    Lung cancer Sister        smoker   Diabetes Sister    Colon cancer Maternal Uncle    Diabetes Daughter        x3   Heart disease Maternal Aunt    Colon polyps Neg Hx    Esophageal cancer Neg Hx    Gallbladder disease Neg Hx    Rectal cancer Neg Hx    Stomach cancer Neg Hx     Social History Social History   Tobacco Use   Smoking status: Former    Current packs/day: 0.00    Average packs/day: 1 pack/day for 30.0 years (30.0 ttl pk-yrs)    Types: Cigarettes    Start date: 01/17/1973    Quit date: 01/18/2003    Years since quitting: 20.8   Smokeless tobacco: Never  Vaping Use   Vaping status: Never Used  Substance Use Topics   Alcohol use: Yes    Alcohol/week: 3.0 standard drinks of alcohol    Types: 3 Cans of beer per week    Comment: drinks 2-3 beers a week   Drug use: No     Allergies   Semaglutide    Review of Systems Review of Systems  Respiratory:  Positive for cough.      Physical Exam Triage Vital Signs ED Triage Vitals  Encounter Vitals Group     BP 12/06/23 1308 (!) 97/59     Girls Systolic BP Percentile --      Girls Diastolic BP Percentile --      Boys Systolic BP Percentile --      Boys Diastolic BP Percentile --      Pulse Rate 12/06/23 1308 (!) 112     Resp 12/06/23 1308 (!) 42     Temp 12/06/23 1308 97.9 F (36.6 C)     Temp Source 12/06/23 1308 Oral     SpO2 12/06/23 1311  91 %     Weight 12/06/23 1305 195 lb (88.5 kg)     Height 12/06/23 1305 5' 5 (1.651 m)     Head Circumference --      Peak Flow --      Pain Score 12/06/23 1302 0     Pain Loc --      Pain Education --       Exclude from Growth Chart --    No data found.  Updated Vital Signs BP (!) 97/59 (BP Location: Right Arm)   Pulse (!) 108   Temp 97.9 F (36.6 C) (Oral)   Resp (!) 40   Ht 5' 5 (1.651 m)   Wt 88.5 kg   SpO2 91% Comment: 2nd (external) monitoring device  BMI 32.45 kg/m   Visual Acuity Right Eye Distance:   Left Eye Distance:   Bilateral Distance:    Right Eye Near:   Left Eye Near:    Bilateral Near:     Physical Exam Vitals reviewed.  Constitutional:      General: She is not in acute distress.    Appearance: She is not ill-appearing, toxic-appearing or diaphoretic.  HENT:     Mouth/Throat:     Mouth: Mucous membranes are moist.  Eyes:     Extraocular Movements: Extraocular movements intact.     Conjunctiva/sclera: Conjunctivae normal.     Pupils: Pupils are equal, round, and reactive to light.  Cardiovascular:     Rate and Rhythm: Regular rhythm. Tachycardia present.  Pulmonary:     Breath sounds: No stridor.     Comments: She is breathing about 40 times a minute in initial triage.  This did go down to about 3-4 times a minute once I was seeing her.  She is moving air fairly well at the time of exam without any rales or rhonchi heard. Musculoskeletal:     Cervical back: Neck supple.  Lymphadenopathy:     Cervical: No cervical adenopathy.  Neurological:     General: No focal deficit present.     Mental Status: She is alert and oriented to person, place, and time.  Psychiatric:        Behavior: Behavior normal.      UC Treatments / Results  Labs (all labs ordered are listed, but only abnormal results are displayed) Labs Reviewed - No data to display  EKG   Radiology No results found.  Procedures Procedures (including critical care time)  Medications Ordered in UC Medications - No data to display  Initial Impression / Assessment and Plan / UC Course  I have reviewed the triage vital signs and the nursing notes.  Pertinent labs & imaging  results that were available during my care of the patient were reviewed by me and considered in my medical decision making (see chart for details).     Her blood pressure is soft at 97/59.  She usually runs in the 110s to 120s systolic.  Her respiratory rate is increased.  On room air her O2 sat is between 88 and 89% on her wall monitor.  Taken altogether she needs to be evaluated in the emergency room on an urgent basis.  I offered ambulance transport though I think overall she is stable for transport with her daughter driving.  They opted to go by private vehicle to the emergency room for further evaluation. Final Clinical Impressions(s) / UC Diagnoses   Final diagnoses:  Dehydration  COPD exacerbation [facility])     Discharge Instructions  She will go to the emergency room with her daughter driving     ED Prescriptions   None    PDMP not reviewed this encounter.   Vonna Sharlet POUR, MD 12/06/23 1330

## 2023-12-06 NOTE — ED Triage Notes (Signed)
 Pt states she has been sick for a week, sob that is worse with exertion. Coughing up green and brown discharge. Denies pain.

## 2023-12-06 NOTE — ED Provider Notes (Signed)
 [place] [facility]ERGENCY DEPARTMENT AT [facility] Provider Note  CSN: [id number] Arrival date & time: 12/06/23 1351  Chief Complaint(s) Shortness of Breath  HPI April Combs is a 78 y.o. female here today for shortness of breath.  Symptoms have been ongoing for the last 1 week, worsened today.  Patient has felt lightheaded at home.  She has a history of COPD, has been feeling increasingly short of breath, dyspneic.  Patient reports that 1 week ago she started to feel sick.  By that, she tells me she means that she had a sore throat, a cough.  She has intermittently felt like she has had chills.  Patient was tachypneic, with reduced O2 sats at urgent care this morning.  The recommended transfer to the ED.   Past Medical History Past Medical History:  Diagnosis Date   Anemia    Anxiety    BRBPR (bright red blood per rectum) 02/05/2015   Chronic kidney disease    Coronary artery disease (CAD) excluded 01/2014   Low Risk Myoview  (false positive) with possible inferior-inferolateral ischemia, but with nonsustained VT --> CATH --> Angiographically Normal Coronary Arteries; coronary CT angiogram showed mild approximately disease but otherwise no obstructive disease.  Coronary calcium  score 40.   Depression    Emphysema of lung (HCC)    FALSE POSITIVE NUCLEAR STRESS CHEST 01/28/2014   Fungal dermatitis 09/29/2011   GERD (gastroesophageal reflux disease)    Gout 04/10/2017   First episode - Left foot 03/2017   Hyperlipidemia    Hyperlipidemia with target LDL less than 100 09/29/2011   Hyperplastic colon polyp    Hypertension    Hypertensive 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 (HCC) 04/26/2017   HYPERTRIGLYCERIDEMIA 07/12/2008   Qualifier: Diagnosis of  By: Mahlon MD, Katherine     Hypothyroidism 02/09/2017   Irregular heart beat 09/29/2011   Lumbar radiculopathy 12/24/2014   Morbid obesity (HCC) 10/15/2015   NICM (nonischemic cardiomyopathy) (HCC)     Essentially resolved. Echocardiogram July 2013 showed normal EF greater than 55%. Important septal motion. Normal LV filling pressures. Mild MR and mild anterior MVP   Obesity (BMI 30-39.9)    Osteoarthritis    Positional vertigo    Prediabetes 08/10/2017   Pulmonary emphysema (HCC) 04/29/2015   PFTs 2017 - moderate-severe obstructive disease, some restrictive disease   SOB (shortness of breath) 08/23/2011   Patient Active Problem List   Diagnosis Date Noted   Frequent UTI 08/25/2023   Acute cystitis 05/27/2023   Bilateral leg pain 11/02/2022   PND (post-nasal drip) 08/25/2022   Vitamin D  deficiency 08/24/2022   Chronic combined systolic and diastolic heart failure (HCC) 07/06/2022   CAD (coronary artery disease) 06/27/2022   Normocytic anemia 06/27/2022   Hypocalcemia 08/20/2021   Diarrhea 04/13/2021   B12 deficiency 02/22/2021   Hair loss 02/19/2021   Tingling of face 02/19/2021   CKD (chronic kidney disease) stage 3, GFR 30-59 ml/min (HCC) 02/18/2021   COPD (chronic obstructive pulmonary disease) (HCC) 10/28/2020   Chronic heart failure with preserved ejection fraction (HFpEF) (HCC) 10/28/2020   Pneumonia due to COVID-19 virus 10/27/2020   Atherosclerosis of aorta 12/31/2019   Physical deconditioning 08/18/2018   Bilateral leg weakness 02/15/2018   Diabetes (HCC) 08/10/2017   Hypertensive 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 (HCC) 04/26/2017   Gout 04/10/2017   Hypothyroidism 02/09/2017   Obesity (BMI 30.0-34.9) 10/15/2015   Pulmonary emphysema (HCC) 04/29/2015   Lumbar radiculopathy 12/24/2014   Hx of  tobacco use, presenting hazards to health 09/29/2014   Nail fungus 03/11/2014   FALSE POSITIVE NUCLEAR STRESS CHEST 01/28/2014   Chest pain on exertion 01/14/2014   GERD (gastroesophageal reflux disease) 05/15/2012   Positional vertigo 12/22/2011   Irregular heart beat 09/29/2011   Hyperlipidemia with target LDL less than 100 09/29/2011    SOB (shortness of breath) 08/23/2011   Essential hypertension 05/28/2008   OSTEOARTHRITIS 05/28/2008   Home Medication(s) Prior to Admission medications   Medication Sig Start Date End Date Taking? Authorizing Provider  acetaminophen  (TYLENOL ) 325 MG tablet Take 2 tablets (650 mg total) by mouth every 6 (six) hours as needed for mild pain (or Fever >/= 101). 11/01/20   Fairy Frames, MD  albuterol  (PROVENTIL ) (2.5 MG/3ML) 0.083% nebulizer solution USE 1 VIAL IN NEBULIZER EVERY 6 HOURS AS NEEDED FOR WHEEZING FOR SHORTNESS OF BREATH Patient taking differently: Take 2.5 mg by nebulization in the morning and at bedtime. 06/14/22   Geofm Glade PARAS, MD  albuterol  (VENTOLIN  HFA) 108 [phone] Base) MCG/ACT inhaler Inhale 2 puffs into the lungs every 6 (six) hours as needed for wheezing or shortness of breath. 08/25/22   Geofm Glade PARAS, MD  allopurinol  (ZYLOPRIM ) 300 MG tablet TAKE 1 TABLET BY MOUTH DAILY 09/09/23   Geofm Glade PARAS, MD  aspirin  EC 81 MG tablet Take 1 tablet (81 mg total) by mouth daily. Swallow whole. 07/01/22   Cherlyn Labella, MD  atorvastatin  (LIPITOR) 40 MG tablet Take 1 tablet (40 mg total) by mouth daily. 03/23/23   Anner Alm ORN, MD  carvedilol  (COREG ) 12.5 MG tablet Take 1.5 tablets (18.75 mg total) by mouth 2 (two) times daily. 03/23/23   Anner Alm ORN, MD  conjugated estrogens  (PREMARIN ) vaginal cream Place 1 Applicatorful vaginally 2 (two) times a week. 08/25/23   Geofm Glade PARAS, MD  Fluticasone -Umeclidin-Vilant (TRELEGY ELLIPTA ) 100-62.5-25 MCG/ACT AEPB Inhale 1 puff into the lungs daily. Plans to start bevespi  after completing rx for trelegy 02/24/23   Geofm Glade PARAS, MD  furosemide  (LASIX ) 20 MG tablet Take 1 tablet (20 mg total) by mouth daily as needed. 03/23/23   Anner Alm ORN, MD  gabapentin  (NEURONTIN ) 100 MG capsule TAKE 2 CAPSULES BY MOUTH IN THE  MORNING AND 3 CAPSULES BY MOUTH  AT BEDTIME 09/09/23   Geofm Glade PARAS, MD  guaiFENesin  (MUCINEX ) 600 MG 12 hr tablet Take 1 tablet (600  mg total) by mouth 2 (two) times daily. 06/30/22   Cherlyn Labella, MD  levothyroxine  (SYNTHROID ) 50 MCG tablet Take 1 tablet by mouth once daily 12/05/23   Geofm Glade PARAS, MD  lisinopril -hydrochlorothiazide  (ZESTORETIC ) 20-25 MG tablet Take 1 tablet by mouth daily. 03/23/23   Anner Alm ORN, MD  omeprazole  (PRILOSEC) 20 MG capsule Take 1 capsule (20 mg total) by mouth daily. TAKE 1 CAPSULE BY MOUTH  TWICE DAILY BEFORE A MEAL 08/25/22   Geofm Glade PARAS, MD  RYBELSUS  7 MG TABS Take 1 tablet by mouth once daily 11/15/23   Geofm Glade PARAS, MD  Past Surgical History Past Surgical History:  Procedure Laterality Date   ABDOMINAL HYSTERECTOMY     APPENDECTOMY  as child   BREAST BIOPSY Right    benign   CHOLECYSTECTOMY     COLONOSCOPY     CORONARY CALCIUM  SCORE/CT ANGIOGRAM  07/2017   Calcium  score 40.  Mild LAD stenosis, but no other significant disease.   DOPPLER ECHOCARDIOGRAPHY  July 2013   09/20/11. normal Lv thickness and function with EF greater thatn 55%   Exercise tolerance test - CPET-MET-TEST  July 2013   Submaximal effort. Only 80% of her, therefore peak VO2 of 75% is likely an underestimate. High resting heart rate, achieving 86% of heart rate. --> Unable to interpret due to lack of effort.   GASTRIC BYPASS  1980   GASTRIC BYPASS     KIDNEY STONE SURGERY     LEFT HEART CATHETERIZATION WITH CORONARY ANGIOGRAM N/A 02/01/2014   Procedure: LEFT HEART CATHETERIZATION WITH CORONARY ANGIOGRAM;  Surgeon: Alm LELON Clay, MD;  Location: [facility] CATH LAB;  Service: Cardiovascular: Angiographically normal coronaries   NM MYOVIEW  LTD  November 2015   4:20 min, 4.6 METS --> DOE, but no chest discomfort; Significant + ST -T wave changes noted with NSVT & PVCs. Images suggest Inferior-inferolateral ischemia.  Low Risk. -- FALSE POSITIVE   Pulmonary Function Tests  July 2013   Increased  densities, decreased FVC - consistent with obesity hypoventilation; FEV1 is 58%, FVC 60% of predicted.   TRANSTHORACIC ECHOCARDIOGRAM  04/2017   Mildly reduced EF of 45%.  No regional wall motion normality.  GR 1 DD   UPPER GASTROINTESTINAL ENDOSCOPY     Family History Family History  Problem Relation Age of Onset   Kidney disease Daughter    Multiple sclerosis Daughter    Heart disease Father    Heart disease Mother    Thyroid  disease Mother    Heart disease Paternal Grandfather    Colon cancer Other        early 67's.  Genetic testing from maternal side of family.   Colon cancer Maternal Aunt    Breast cancer Maternal Aunt    Lung cancer Sister        smoker   Diabetes Sister    Colon cancer Maternal Uncle    Diabetes Daughter        x3   Heart disease Maternal Aunt    Colon polyps Neg Hx    Esophageal cancer Neg Hx    Gallbladder disease Neg Hx    Rectal cancer Neg Hx    Stomach cancer Neg Hx     Social History Social History   Tobacco Use   Smoking status: Former    Current packs/day: 0.00    Average packs/day: 1 pack/day for 30.0 years (30.0 ttl pk-yrs)    Types: Cigarettes    Start date: 01/17/1973    Quit date: 01/18/2003    Years since quitting: 20.8   Smokeless tobacco: Never  Vaping Use   Vaping status: Never Used  Substance Use Topics   Alcohol use: Yes    Alcohol/week: 3.0 standard drinks of alcohol    Types: 3 Cans of beer per week    Comment: drinks 2-3 beers a week   Drug use: No   Allergies Semaglutide   Review of Systems Review of Systems  Physical Exam Vital Signs  I have reviewed the triage vital signs BP 127/72   Pulse 98   Temp 98.3 F (36.8 C) (Oral)  Resp (!) 24   Ht 5' 5 (1.651 m)   Wt 88.5 kg   SpO2 98%   BMI 32.45 kg/m   Physical Exam Vitals reviewed.  Constitutional:      Appearance: She is not toxic-appearing.  Cardiovascular:     Rate and Rhythm: Tachycardia present.  Pulmonary:     Effort: Tachypnea present.      Breath sounds: Wheezing and rhonchi present.  Abdominal:     Palpations: Abdomen is soft.     Tenderness: There is no abdominal tenderness.  Musculoskeletal:        General: Normal range of motion.     Cervical back: Normal range of motion.     Right lower leg: No edema.     Left lower leg: No edema.  Neurological:     General: No focal deficit present.     Mental Status: She is alert.     ED Results and Treatments Labs (all labs ordered are listed, but only abnormal results are displayed) Labs Reviewed  CBC WITH DIFFERENTIAL/PLATELET - Abnormal; Notable for the following components:      Result Value   WBC 14.5 (*)    RBC 3.61 (*)    Hemoglobin 11.7 (*)    HCT 35.7 (*)    Neutro Abs 12.4 (*)    Abs Immature Granulocytes 0.16 (*)    All other components within normal limits  BASIC METABOLIC PANEL WITH GFR - Abnormal; Notable for the following components:   Sodium 133 (*)    Chloride 96 (*)    CO2 21 (*)    Glucose, Bld 103 (*)    BUN 31 (*)    Creatinine, Ser 1.16 (*)    GFR, Estimated 48 (*)    Anion gap 16 (*)    All other components within normal limits  RESP PANEL BY RT-PCR (RSV, FLU A&B, COVID)  RVPGX2  CULTURE, BLOOD (ROUTINE X 2)  CULTURE, BLOOD (ROUTINE X 2)  LACTIC ACID, PLASMA  LACTIC ACID, PLASMA  I-STAT CG4 LACTIC ACID, ED                                                                                                                          Radiology DG Chest 2 View Result Date: 12/06/2023 CLINICAL DATA:  Cough. EXAM: DG CHEST 2V COMPARISON:  Chest x-ray 06/27/2022 FINDINGS: There some faint patchy opacities in the bilateral lower lungs, right greater than left. The left lower lung opacity is slightly nodular. There is a small left pleural effusion. There is no pneumothorax. No acute fractures are seen. There surgical clips in the upper abdomen. IMPRESSION: 1. Faint patchy opacities in the bilateral lower lungs, right greater than left. The left lower  lung opacity is slightly nodular. Findings are concerning for infection. Follow-up chest x-ray recommended in 4-6 weeks to confirm resolution. 2. Small left pleural effusion. Electronically Signed   By: Greig Pique M.D.   On: 12/06/2023 15:11  Pertinent labs & imaging results that were available during my care of the patient were reviewed by me and considered in my medical decision making (see MDM for details).  Medications Ordered in ED Medications  methylPREDNISolone  sodium succinate (SOLU-MEDROL ) 125 mg/2 mL injection 125 mg (125 mg Intravenous Given 12/06/23 1608)  ipratropium-albuterol  (DUONEB) 0.5-2.5 (3) MG/3ML nebulizer solution 3 mL (3 mLs Nebulization Given 12/06/23 1608)  cefTRIAXone  (ROCEPHIN ) 1 g in sodium chloride  0.9 % 100 mL IVPB (0 g Intravenous Stopped 12/06/23 1722)  azithromycin  (ZITHROMAX ) 500 mg in sodium chloride  0.9 % 250 mL IVPB (0 mg Intravenous Stopped 12/06/23 1722)  sodium chloride  0.9 % bolus 1,000 mL (0 mLs Intravenous Stopped 12/06/23 1722)                                                                                                                                     Procedures Procedures  (including critical care time)  Medical Decision Making / ED Course   This patient presents to the ED for concern of shortness of breath, weakness, lightheadedness, this involves an extensive number of treatment options, and is a complaint that carries with it a high risk of complications and morbidity.  The differential diagnosis includes pneumonia, viral syndrome, COPD exacerbation, less likely PE.  MDM: Based on the patient's symptoms, sounds though she likely had URI over the last 1 week, possibly has developed a pneumonia.  Patient's coarse breath sounds, no significant wheezing.  Patient some labs drawn at triage did show a mild leukocytosis.  Coupled with her heart rate, tachypnea, patient does meet sepsis criteria.  Have started her on broad-spectrum antibiotics  for likely viral source.  Out of concern for fluid overload, will provide patient with 1 L of fluid and reassess.  Patient here with daughter.  They state that the patient has an important wedding to go to on this Saturday.  Reassessment 5:35 PM-patient's chest x-ray shows pneumonia.  No elevation of her lactic acid.  Patient still mildly tachycardic, tachypneic.  Given her comorbidities, and abnormal vital signs, believe she requires admission for treatment of her pneumonia.  Patient not currently requiring any supplemental O2.  Will admit to hospitalist service.   Additional history obtained: -Additional history obtained from daughter at bedside -External records from outside source obtained and reviewed including: Chart review including previous notes, labs, imaging, consultation notes   Lab Tests: -I ordered, reviewed, and interpreted labs.   The pertinent results include:   Labs Reviewed  CBC WITH DIFFERENTIAL/PLATELET - Abnormal; Notable for the following components:      Result Value   WBC 14.5 (*)    RBC 3.61 (*)    Hemoglobin 11.7 (*)    HCT 35.7 (*)    Neutro Abs 12.4 (*)    Abs Immature Granulocytes 0.16 (*)    All other components within normal limits  BASIC METABOLIC PANEL WITH GFR - Abnormal; Notable for  the following components:   Sodium 133 (*)    Chloride 96 (*)    CO2 21 (*)    Glucose, Bld 103 (*)    BUN 31 (*)    Creatinine, Ser 1.16 (*)    GFR, Estimated 48 (*)    Anion gap 16 (*)    All other components within normal limits  RESP PANEL BY RT-PCR (RSV, FLU A&B, COVID)  RVPGX2  CULTURE, BLOOD (ROUTINE X 2)  CULTURE, BLOOD (ROUTINE X 2)  LACTIC ACID, PLASMA  LACTIC ACID, PLASMA  I-STAT CG4 LACTIC ACID, ED      EKG my independent review of the patient's EKG shows no ST segment depressions or elevations, no T wave inversions, no evidence of acute ischemia.  Occasional PVCs.  EKG Interpretation Date/Time:  Tuesday December 06 2023 14:13:19  EDT Ventricular Rate:  112 PR Interval:  161 QRS Duration:  88 QT Interval:  289 QTC Calculation: 395 R Axis:   58  Text Interpretation: Sinus tachycardia Multiple ventricular premature complexes Nonspecific T abnormalities, diffuse leads Confirmed by Mannie Pac [phone]) on 12/06/2023 3:10:57 PM         Imaging Studies ordered: I ordered imaging studies including chest x-ray I independently visualized and interpreted imaging. I agree with the radiologist interpretation   Medicines ordered and prescription drug management: Meds ordered this encounter  Medications   methylPREDNISolone  sodium succinate (SOLU-MEDROL ) 125 mg/2 mL injection 125 mg   ipratropium-albuterol  (DUONEB) 0.5-2.5 (3) MG/3ML nebulizer solution 3 mL   cefTRIAXone  (ROCEPHIN ) 1 g in sodium chloride  0.9 % 100 mL IVPB    Antibiotic Indication::   CAP   azithromycin  (ZITHROMAX ) 500 mg in sodium chloride  0.9 % 250 mL IVPB   sodium chloride  0.9 % bolus 1,000 mL    -I have reviewed the patients home medicines and have made adjustments as needed   Cardiac Monitoring: The patient was maintained on a cardiac monitor.  I personally viewed and interpreted the cardiac monitored which showed an underlying rhythm of: Sinus tachycardia  Social Determinants of Health:  Factors impacting patients care include: Multiple comorbidities including COPD   Reevaluation: After the interventions noted above, I reevaluated the patient and found that they have :improved  Co morbidities that complicate the patient evaluation  Past Medical History:  Diagnosis Date   Anemia    Anxiety    BRBPR (bright red blood per rectum) 02/05/2015   Chronic kidney disease    Coronary artery disease (CAD) excluded 01/2014   Low Risk Myoview  (false positive) with possible inferior-inferolateral ischemia, but with nonsustained VT --> CATH --> Angiographically Normal Coronary Arteries; coronary CT angiogram showed mild approximately disease but  otherwise no obstructive disease.  Coronary calcium  score 40.   Depression    Emphysema of lung (HCC)    FALSE POSITIVE NUCLEAR STRESS CHEST 01/28/2014   Fungal dermatitis 09/29/2011   GERD (gastroesophageal reflux disease)    Gout 04/10/2017   First episode - Left foot 03/2017   Hyperlipidemia    Hyperlipidemia with target LDL less than 100 09/29/2011   Hyperplastic colon polyp    Hypertension    Hypertensive 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 (HCC) 04/26/2017   HYPERTRIGLYCERIDEMIA 07/12/2008   Qualifier: Diagnosis of  By: Mahlon MD, Katherine     Hypothyroidism 02/09/2017   Irregular heart beat 09/29/2011   Lumbar radiculopathy 12/24/2014   Morbid obesity (HCC) 10/15/2015   NICM (nonischemic cardiomyopathy) (HCC)    Essentially resolved. Echocardiogram July 2013 showed normal  EF greater than 55%. Important septal motion. Normal LV filling pressures. Mild MR and mild anterior MVP   Obesity (BMI 30-39.9)    Osteoarthritis    Positional vertigo    Prediabetes 08/10/2017   Pulmonary emphysema (HCC) 04/29/2015   PFTs 2017 - moderate-severe obstructive disease, some restrictive disease   SOB (shortness of breath) 08/23/2011        Final Clinical Impression(s) / ED Diagnoses Final diagnoses:  Pneumonia of left lower lobe due to infectious organism     @PCDICTATION @    Mannie Pac T, DO 12/06/23 1739

## 2023-12-07 DIAGNOSIS — J189 Pneumonia, unspecified organism: Secondary | ICD-10-CM | POA: Diagnosis not present

## 2023-12-07 LAB — BASIC METABOLIC PANEL WITH GFR
Anion gap: 13 (ref 5–15)
BUN: 27 mg/dL — ABNORMAL HIGH (ref 8–23)
CO2: 20 mmol/L — ABNORMAL LOW (ref 22–32)
Calcium: 8 mg/dL — ABNORMAL LOW (ref 8.9–10.3)
Chloride: 101 mmol/L (ref 98–111)
Creatinine, Ser: 0.92 mg/dL (ref 0.44–1.00)
GFR, Estimated: >60 mL/min (ref >60)
Glucose, Bld: 244 mg/dL — ABNORMAL HIGH (ref 70–99)
Potassium: 3.7 mmol/L (ref 3.5–5.1)
Sodium: 135 mmol/L (ref 135–145)

## 2023-12-07 LAB — CBC
HCT: 31.2 % — ABNORMAL LOW (ref 36.0–46.0)
Hemoglobin: 10.5 g/dL — ABNORMAL LOW (ref 12.0–15.0)
MCH: 32.7 pg (ref 26.0–34.0)
MCHC: 33.7 g/dL (ref 30.0–36.0)
MCV: 97.2 fL (ref 80.0–100.0)
Platelets: 191 K/uL (ref 150–400)
RBC: 3.21 MIL/uL — ABNORMAL LOW (ref 3.87–5.11)
RDW: 14.2 % (ref 11.5–15.5)
WBC: 9.6 K/uL (ref 4.0–10.5)
nRBC: 0 % (ref 0.0–0.2)

## 2023-12-07 LAB — GLUCOSE, CAPILLARY
Glucose-Capillary: 181 mg/dL — ABNORMAL HIGH (ref 70–99)
Glucose-Capillary: 172 mg/dL — ABNORMAL HIGH (ref 70–99)
Glucose-Capillary: 187 mg/dL — ABNORMAL HIGH (ref 70–99)
Glucose-Capillary: 162 mg/dL — ABNORMAL HIGH (ref 70–99)

## 2023-12-07 LAB — STREP PNEUMONIAE URINARY ANTIGEN: Strep Pneumo Urinary Antigen: NEGATIVE

## 2023-12-07 MED ORDER — ALBUTEROL SULFATE (2.5 MG/3ML) 0.083% IN NEBU
2.5000 mg | INHALATION_SOLUTION | Freq: Four times a day (QID) | RESPIRATORY_TRACT | Status: DC | PRN
  Filled 2023-12-07 (×4): qty 3

## 2023-12-07 MED ORDER — ALBUTEROL SULFATE (2.5 MG/3ML) 0.083% IN NEBU
2.5000 mg | INHALATION_SOLUTION | Freq: Two times a day (BID) | RESPIRATORY_TRACT | Status: DC
  Administered 2023-12-07 – 2023-12-09 (×4): 2.5 mg via RESPIRATORY_TRACT
  Filled 2023-12-07 (×6): qty 3

## 2023-12-07 NOTE — Progress Notes (Signed)
 PROGRESS NOTE  April Combs  FMW:[id number] DOB: [birth year] DOA: 12/06/2023 PCP: Geofm Glade PARAS, MD   Brief Narrative: April Combs is a 78 y.o. female with medical history significant of with history of COPD,prior tobacco use, hypertension, hyperlipidemia, coronary artery disease, HFmrEF, hypothyroidism who presented to the Emergency Department from home with complaint of shortness of breath , productive cough, dizziness, fever, chills. Lab work showed leukocytosis in the range of 14k , creatinine of 1.1. Chest x-ray showed faint patchy opacities in the bilateral lower lungs, right greater than left.  Patient admitted for the management of multifocal pneumonia.  Started on broad spectrum antibiotics.  Blood culture sent.  COVID/flu/RSV negative. She was treated with a dose of azithromycin , ceftriaxone  and Solu-Medrol .  Significantly improved clinically today.  Waiting for PT/OT evaluation.  Possible discharge home tomorrow  Assessment & Plan:  Principal Problem:   Multifocal pneumonia Active Problems:   CAD (coronary artery disease)   Essential hypertension   Hypothyroidism   Hyperlipidemia with target LDL less than 100   Pulmonary emphysema (HCC)   Hypertensive 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 (HCC)   CKD (chronic kidney disease) stage 3, GFR 30-59 ml/min (HCC)   Chronic combined systolic and diastolic heart failure (HCC)   Multifocal pneumonia: Presented with shortness of breath, wheezing, productive cough and fever.  Mild leukocytosis.  Maintaining saturation on room air.  Lactic acid level normal.  Chest x-ray  shows multifocal pneumonia. Continue broad-spectrum antibiotics with azithromycin  and ceftriaxone . Pending Legionella,negative urine streptococcal antigen.  Follow-up blood cultures.  Will send sputum culture.NGTD   COPD exacerbation: Follows with pulmonology at [facility] system.  History of prior tobacco use.  Has history of pulm  nodules.  On Trelegy at home.  Continue bronchodilators as needed.  Given a dose of Solu-Medrol  daily.  Continue prednisone  for 4 more days to finish the course.   Chronic combined systolic/diastolic CHF: Last echo done on 06/2022 showed EF of 45%, grade 1 diastolic dysfunction.  She follows with cardiology.  Takes Coreg , Lasix  at home.  Lasix   held because he appeared dehydrated on presentation.  Will resume low-dose Coreg    Hypertension: Currently stable.  Takes coreg ,lisinopril  - hydrochlorothiazide  at home.  Currently normotensive.  Coreg  resumed at low-dose.   Hypothyroidism: Continue Synthyroid   Hyperlipidemia: Continue statin   History of coronary artery disease: No anginal symptoms.  Takes aspirin , statin , Coreg  at home.  Being continued   History of iron deficiency anemia: Follows with hematology, Dr. Sherrod.  Currently hemoglobin stable   CKD stage IIIa: Baseline creatinine around 1.3.  Currently kidney function at baseline   GERD: Continue PPI   History of gout: Takes allopurinol  at home   Diabetes type 2: Recent A1c of 5.7.  Currently on sliding scale.  Takes semaglutide  at home   Deconditioning/debility: Ambulates without any difficulty at baseline.  Was dizzy at home.  Consulted PT        DVT prophylaxis:heparin  injection 5,000 Units Start: 12/06/23 2200     Code Status: Full Code  Family Communication: Daughter rhonda on phone on 9/23  Patient status: Inpatient  Patient is from : Home  Anticipated discharge to: Home  Estimated DC date: Tomorrow   Consultants: None  Procedures: None  Antimicrobials:  Anti-infectives (From admission, onward)    Start     Dose/Rate Route Frequency Ordered Stop   12/07/23 1600  cefTRIAXone  (ROCEPHIN ) 2 g in sodium chloride  0.9 % 100 mL IVPB  2 g 200 mL/hr over 30 Minutes Intravenous Every 24 hours 12/06/23 1827 12/11/23 1559   12/07/23 1600  azithromycin  (ZITHROMAX ) tablet 500 mg        500 mg Oral Daily  12/06/23 1827 12/11/23 1559   12/06/23 1830  cefTRIAXone  (ROCEPHIN ) 1 g in sodium chloride  0.9 % 100 mL IVPB        1 g 200 mL/hr over 30 Minutes Intravenous  Once 12/06/23 1827     12/06/23 1530  cefTRIAXone  (ROCEPHIN ) 1 g in sodium chloride  0.9 % 100 mL IVPB        1 g 200 mL/hr over 30 Minutes Intravenous  Once 12/06/23 1516 12/06/23 1722   12/06/23 1530  azithromycin  (ZITHROMAX ) 500 mg in sodium chloride  0.9 % 250 mL IVPB        500 mg 250 mL/hr over 60 Minutes Intravenous  Once 12/06/23 1516 12/06/23 1722       Subjective: Patient seen and examined at bedside today.  Hemodynamically stable.  Feels much better today.  She says she feels like a bit different person.  Denied any  shortness of breath or cough.  Has been on room air.  Waiting for PT evaluation.  We discussed about continue IV antibiotics for today and possible discharge home tomorrow if she continues to improve  Objective: Vitals:   12/07/23 0505 12/07/23 0754 12/07/23 0820 12/07/23 0823  BP: 138/78  111/61 111/61  Pulse: 91  (!) 108 (!) 108  Resp: 18  17   Temp: 97.7 F (36.5 C)  97.7 F (36.5 C)   TempSrc: Oral  Oral   SpO2: 92% 93% 93%   Weight:      Height:        Intake/Output Summary (Last 24 hours) at 12/07/2023 1120 Last data filed at 12/06/2023 1722 Gross per 24 hour  Intake 1350 ml  Output --  Net 1350 ml   Filed Weights   12/06/23 1404  Weight: 88.5 kg    Examination:  General exam: Overall comfortable, not in distress, pleasant elderly female HEENT: PERRL Respiratory system:  no wheezes or crackles  Cardiovascular system: S1 & S2 heard, RRR.  Gastrointestinal system: Abdomen is nondistended, soft and nontender. Central nervous system: Alert and oriented Extremities: No edema, no clubbing ,no cyanosis Skin: No rashes, no ulcers,no icterus     Data Reviewed: I have personally reviewed following labs and imaging studies  CBC: Recent Labs  Lab 12/06/23 1417 12/07/23 0526  WBC 14.5*  9.6  NEUTROABS 12.4*  --   HGB 11.7* 10.5*  HCT 35.7* 31.2*  MCV 98.9 97.2  PLT 210 191   Basic Metabolic Panel: Recent Labs  Lab 12/06/23 1417 12/07/23 0526  NA 133* 135  K 3.9 3.7  CL 96* 101  CO2 21* 20*  GLUCOSE 103* 244*  BUN 31* 27*  CREATININE 1.16* 0.92  CALCIUM  8.9 8.0*     Recent Results (from the past 240 hours)  Blood culture (routine x 2)     Status: None (Preliminary result)   Collection Time: 12/06/23  3:20 PM   Specimen: BLOOD  Result Value Ref Range Status   Specimen Description   Final    BLOOD LEFT ANTECUBITAL Performed at [facility], [address] [place], [place] [address]    Special Requests   Final    BOTTLES DRAWN AEROBIC AND ANAEROBIC Blood Culture adequate volume Performed at [facility], [address], [place], [place] [address]  Culture   Final    NO GROWTH < 12 HOURS Performed at [facility], [address] [place], [place] [address]    Report Status PENDING  Incomplete  Resp panel by RT-PCR (RSV, Flu A&B, Covid) Anterior Nasal Swab     Status: None   Collection Time: 12/06/23  3:35 PM   Specimen: Anterior Nasal Swab  Result Value Ref Range Status   SARS Coronavirus 2 by RT PCR NEGATIVE NEGATIVE Final    Comment: (NOTE) SARS-CoV-2 target nucleic acids are NOT DETECTED.  The SARS-CoV-2 RNA is generally detectable in upper respiratory specimens during the acute phase of infection. The lowest concentration of SARS-CoV-2 viral copies this assay can detect is 138 copies/mL. A negative result does not preclude SARS-Cov-2 infection and should not be used as the sole basis for treatment or other patient management decisions. A negative result may occur with  improper specimen collection/handling, submission of specimen other than nasopharyngeal swab, presence of viral mutation(s) within the areas targeted by this assay, and inadequate number of viral copies(<138 copies/mL). A negative  result must be combined with clinical observations, patient history, and epidemiological information. The expected result is Negative.  Fact Sheet for Patients:  BloggerCourse.com  Fact Sheet for Healthcare Providers:  SeriousBroker.it  This test is no t yet approved or cleared by the [place]  FDA and  has been authorized for detection and/or diagnosis of SARS-CoV-2 by FDA under an Emergency Use Authorization (EUA). This EUA will remain  in effect (meaning this test can be used) for the duration of the COVID-19 declaration under Section 564(b)(1) of the Act, 21 U.S.C.section 360bbb-3(b)(1), unless the authorization is terminated  or revoked sooner.       Influenza A by PCR NEGATIVE NEGATIVE Final   Influenza B by PCR NEGATIVE NEGATIVE Final    Comment: (NOTE) The Xpert Xpress SARS-CoV-2/FLU/RSV plus assay is intended as an aid in the diagnosis of influenza from Nasopharyngeal swab specimens and should not be used as a sole basis for treatment. Nasal washings and aspirates are unacceptable for Xpert Xpress SARS-CoV-2/FLU/RSV testing.  Fact Sheet for Patients: BloggerCourse.com  Fact Sheet for Healthcare Providers: SeriousBroker.it  This test is not yet approved or cleared by the [place]  FDA and has been authorized for detection and/or diagnosis of SARS-CoV-2 by FDA under an Emergency Use Authorization (EUA). This EUA will remain in effect (meaning this test can be used) for the duration of the COVID-19 declaration under Section 564(b)(1) of the Act, 21 U.S.C. section 360bbb-3(b)(1), unless the authorization is terminated or revoked.     Resp Syncytial Virus by PCR NEGATIVE NEGATIVE Final    Comment: (NOTE) Fact Sheet for Patients: BloggerCourse.com  Fact Sheet for Healthcare Providers: SeriousBroker.it  This  test is not yet approved or cleared by the [place]  FDA and has been authorized for detection and/or diagnosis of SARS-CoV-2 by FDA under an Emergency Use Authorization (EUA). This EUA will remain in effect (meaning this test can be used) for the duration of the COVID-19 declaration under Section 564(b)(1) of the Act, 21 U.S.C. section 360bbb-3(b)(1), unless the authorization is terminated or revoked.  Performed at [facility], [address] [place], [place] [address]   Blood culture (routine x 2)     Status: None (Preliminary result)   Collection Time: 12/06/23  3:35 PM   Specimen: BLOOD  Result Value Ref Range Status   Specimen Description   Final    BLOOD RIGHT ANTECUBITAL Performed  at [facility], [address], [place], [place] [address]    Special Requests   Final    BOTTLES DRAWN AEROBIC AND ANAEROBIC Blood Culture adequate volume Performed at [facility], [address] [place], [place] [address]    Culture   Final    NO GROWTH < 12 HOURS Performed at [facility], [address], [place], [place] [address]    Report Status PENDING  Incomplete     Radiology Studies: DG Chest 2 View Result Date: 12/06/2023 CLINICAL DATA:  Cough. EXAM: DG CHEST 2V COMPARISON:  Chest x-ray 06/27/2022 FINDINGS: There some faint patchy opacities in the bilateral lower lungs, right greater than left. The left lower lung opacity is slightly nodular. There is a small left pleural effusion. There is no pneumothorax. No acute fractures are seen. There surgical clips in the upper abdomen. IMPRESSION: 1. Faint patchy opacities in the bilateral lower lungs, right greater than left. The left lower lung opacity is slightly nodular. Findings are concerning for infection. Follow-up chest x-ray recommended in 4-6 weeks to confirm resolution. 2. Small left pleural effusion. Electronically Signed   By: Greig Pique M.D.   On: 12/06/2023  15:11    Scheduled Meds:  albuterol   2.5 mg Nebulization BID   allopurinol   300 mg Oral Daily   aspirin  EC  81 mg Oral Daily   atorvastatin   40 mg Oral Daily   azithromycin   500 mg Oral Daily   budesonide -glycopyrrolate -formoterol   2 puff Inhalation BID   carvedilol   3.125 mg Oral BID WC   gabapentin   200 mg Oral BID   guaiFENesin   600 mg Oral BID   heparin   5,000 Units Subcutaneous Q8H   insulin  aspart  0-9 Units Subcutaneous TID WC   levothyroxine   50 mcg Oral Daily   pantoprazole   40 mg Oral Daily   polyethylene glycol  17 g Oral Daily   predniSONE   40 mg Oral Q breakfast   Continuous Infusions:  cefTRIAXone  (ROCEPHIN )  IV Stopped (12/06/23 2150)   cefTRIAXone  (ROCEPHIN )  IV       LOS: 1 day   Ivonne Mustache, MD [facility] P9/24/2025, 11:20 AM

## 2023-12-07 NOTE — TOC Initial Note (Signed)
 Transition of Care [facility]) - Initial/Assessment Note    Patient Details  Name: April Combs MRN: [id number] Date of Birth: 10-Mar-1946  Transition of Care [facility]) CM/SW Contact:    Bascom Service, RN Phone Number: 12/07/2023, 3:00 PM  Clinical Narrative: d/c plan home. Has own transport. PT cons await recc.                  Expected Discharge Plan: Home/Self Care Barriers to Discharge: Continued Medical Work up   Patient Goals and CMS Choice Patient states their goals for this hospitalization and ongoing recovery are:: Home CMS Medicare.gov Compare Post Acute Care list provided to:: Patient Represenative (must comment) (Angela(dtr)) Choice offered to / list presented to : Adult Children [place] ownership interest in [facility].provided to:: Adult Children    Expected Discharge Plan and Services     Post Acute Care Choice: Home Health Living arrangements for the past 2 months: Single Family Home                                      Prior Living Arrangements/Services Living arrangements for the past 2 months: Single Family Home Lives with:: Adult Children                   Activities of Daily Living      Permission Sought/Granted                  Emotional Assessment              Admission diagnosis:  Pneumonia of left lower lobe due to infectious organism [J18.9] Multifocal pneumonia [J18.9] Patient Active Problem List   Diagnosis Date Noted   Multifocal pneumonia 12/06/2023   Frequent UTI 08/25/2023   Acute cystitis 05/27/2023   Bilateral leg pain 11/02/2022   PND (post-nasal drip) 08/25/2022   Vitamin D  deficiency 08/24/2022   Chronic combined systolic and diastolic heart failure (HCC) 07/06/2022   CAD (coronary artery disease) 06/27/2022   Normocytic anemia 06/27/2022   Hypocalcemia 08/20/2021   Diarrhea 04/13/2021   B12 deficiency 02/22/2021   Hair loss 02/19/2021   Tingling of face 02/19/2021   CKD (chronic  kidney disease) stage 3, GFR 30-59 ml/min (HCC) 02/18/2021   COPD (chronic obstructive pulmonary disease) (HCC) 10/28/2020   Chronic heart failure with preserved ejection fraction (HFpEF) (HCC) 10/28/2020   Pneumonia due to COVID-19 virus 10/27/2020   Atherosclerosis of aorta 12/31/2019   Physical deconditioning 08/18/2018   Bilateral leg weakness 02/15/2018   Diabetes (HCC) 08/10/2017   Hypertensive 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 (HCC) 04/26/2017   Gout 04/10/2017   Hypothyroidism 02/09/2017   Obesity (BMI 30.0-34.9) 10/15/2015   Pulmonary emphysema (HCC) 04/29/2015   Lumbar radiculopathy 12/24/2014   Hx of tobacco use, presenting hazards to health 09/29/2014   Nail fungus 03/11/2014   FALSE POSITIVE NUCLEAR STRESS CHEST 01/28/2014   Chest pain on exertion 01/14/2014   GERD (gastroesophageal reflux disease) 05/15/2012   Positional vertigo 12/22/2011   Irregular heart beat 09/29/2011   Hyperlipidemia with target LDL less than 100 09/29/2011   SOB (shortness of breath) 08/23/2011   Essential hypertension 05/28/2008   OSTEOARTHRITIS 05/28/2008   PCP:  Geofm Glade PARAS, MD Pharmacy:   [id number] [address]), [place] - [address] [place] ([place]) [place] [address] Phone: [phone] Fax: [phone]  Social Drivers of Health (SDOH) Social History: SDOH Screenings   Food Insecurity: Food Insecurity Present (12/06/2023)  Housing: Low Risk  (12/06/2023)  Transportation Needs: No Transportation Needs (12/06/2023)  Utilities: Not At Risk (12/06/2023)  Alcohol Screen: Low Risk  (05/27/2023)  Depression (PHQ2-9): Low Risk  (02/24/2023)  Financial Resource Strain: Low Risk  (05/27/2023)  Physical Activity: Inactive (05/27/2023)  Social Connections: Unknown (12/06/2023)  Stress: No Stress Concern Present (05/27/2023)  Tobacco Use: Medium Risk (12/06/2023)  Health Literacy: Adequate Health Literacy (12/16/2022)   SDOH  Interventions:     Readmission Risk Interventions     No data to display

## 2023-12-07 NOTE — Evaluation (Signed)
 Physical Therapy Brief Evaluation and Discharge Note Patient Details Name: April Combs MRN: [id number] DOB: 02-01-1946 Today's Date: 12/07/2023   History of Present Illness  78 yo female admited with Pna, COPD exac, dizziness, weakness. Hx of morbid obesity, anemia, CKD, gout, lumbar radiculopathy, DM, COPD, vertigo, HF, CAD, gastric bypass  Clinical Impression  Mod Ind with mobility. Pt denied dizziness, dyspnea. O2 94% on RA, HR 106 bpm.        PT Assessment    Assistance Needed at Discharge       Equipment Recommendations None recommended by PT  Recommendations for Other Services       Precautions/Restrictions Restrictions Weight Bearing Restrictions Per Provider Order: No        Mobility  Bed Mobility          Transfers Overall transfer level: Modified independent                      Ambulation/Gait Ambulation/Gait assistance: Modified independent (Device/Increase time) Gait Distance (Feet): 125 Feet Assistive device: None Gait Pattern/deviations: WFL(Within Functional Limits)      Home Activity Instructions    Stairs Stairs: Yes Stairs assistance: Modified independent (Device/Increase time) Stair Management: Step to pattern, Forwards, One rail Left Number of Stairs: 3    Modified Rankin (Stroke Patients Only)        Balance                          Pertinent Vitals/Pain   Pain Assessment Pain Assessment: No/denies pain     Home Living   Living Arrangements: Alone                Prior Function        UE/LE Assessment               Communication   Communication Communication: No apparent difficulties     Cognition         General Comments      Exercises     Assessment/Plan    PT Problem List         PT Visit Diagnosis      No Skilled PT Patient is modified independent with all activity/mobility   Co-evaluation                AMPAC 6 Clicks Help needed turning  from your back to your side while in a flat bed without using bedrails?: None Help needed moving from lying on your back to sitting on the side of a flat bed without using bedrails?: None Help needed moving to and from a bed to a chair (including a wheelchair)?: None Help needed standing up from a chair using your arms (e.g., wheelchair or bedside chair)?: None Help needed to walk in hospital room?: None Help needed climbing 3-5 steps with a railing? : None 6 Click Score: 24      End of Session   Activity Tolerance: Patient tolerated treatment well Patient left: in bed;with call bell/phone within reach;with family/visitor present         Time: 8441-8393 PT Time Calculation (min) (ACUTE ONLY): 8 min  Charges:   PT Evaluation $PT Eval Low Complexity: 1 Low         Dannial SQUIBB, PT Acute Rehabilitation  Office: [phone]

## 2023-12-08 ENCOUNTER — Other Ambulatory Visit (HOSPITAL_COMMUNITY): Payer: Self-pay

## 2023-12-08 ENCOUNTER — Encounter (HOSPITAL_COMMUNITY): Payer: Self-pay | Admitting: Internal Medicine

## 2023-12-08 DIAGNOSIS — J441 Chronic obstructive pulmonary disease with (acute) exacerbation: Secondary | ICD-10-CM

## 2023-12-08 DIAGNOSIS — J189 Pneumonia, unspecified organism: Secondary | ICD-10-CM | POA: Diagnosis not present

## 2023-12-08 LAB — GLUCOSE, CAPILLARY: Glucose-Capillary: 134 mg/dL — ABNORMAL HIGH (ref 70–99)

## 2023-12-08 MED ORDER — AZITHROMYCIN 500 MG PO TABS
500.0000 mg | ORAL_TABLET | Freq: Every day | ORAL | 0 refills | Status: AC
  Filled 2023-12-08 (×2): qty 4, 4d supply, fill #0

## 2023-12-08 MED ORDER — BENZONATATE 200 MG PO CAPS
200.0000 mg | ORAL_CAPSULE | Freq: Three times a day (TID) | ORAL | 0 refills | Status: DC | PRN
  Filled 2023-12-08 (×2): qty 20, 7d supply, fill #0

## 2023-12-08 MED ORDER — PREDNISONE 20 MG PO TABS
40.0000 mg | ORAL_TABLET | Freq: Every day | ORAL | 0 refills | Status: AC
  Filled 2023-12-08 (×2): qty 8, 4d supply, fill #0

## 2023-12-08 MED ORDER — BENZONATATE 100 MG PO CAPS
200.0000 mg | ORAL_CAPSULE | Freq: Three times a day (TID) | ORAL | Status: DC | PRN
  Administered 2023-12-08: 200 mg via ORAL
  Filled 2023-12-08 (×4): qty 2

## 2023-12-08 MED ORDER — CEFADROXIL 500 MG PO CAPS
1000.0000 mg | ORAL_CAPSULE | Freq: Two times a day (BID) | ORAL | 0 refills | Status: AC
Start: 2023-12-09 — End: 2023-12-11
  Filled 2023-12-08 (×2): qty 8, 2d supply, fill #0

## 2023-12-08 MED ORDER — IPRATROPIUM-ALBUTEROL 0.5-2.5 (3) MG/3ML IN SOLN
3.0000 mL | Freq: Four times a day (QID) | RESPIRATORY_TRACT | 0 refills | Status: AC | PRN
  Filled 2023-12-08 (×2): qty 360, 30d supply, fill #0

## 2023-12-08 MED ORDER — MELATONIN 5 MG PO TABS
5.0000 mg | ORAL_TABLET | Freq: Every evening | ORAL | Status: DC | PRN
  Administered 2023-12-08: 5 mg via ORAL
  Filled 2023-12-08 (×2): qty 1

## 2023-12-08 NOTE — Assessment & Plan Note (Signed)
 Restart lipitor 40 mg daily after discharge.

## 2023-12-08 NOTE — Assessment & Plan Note (Signed)
 Continue with Day # 5 Rocephin  today. Discharge with 2 more days of po Duricef 1000 mg bid.

## 2023-12-08 NOTE — Plan of Care (Signed)

## 2023-12-08 NOTE — Assessment & Plan Note (Addendum)
 On coreg . Can restart ACEI/hydrochlorothiazide  after DC. Scr back to normal at 0.92. f/u with PCP to make sure that ACEI/hydrochlorothiazide  is the correct HTN meds for CKD stage 3a

## 2023-12-08 NOTE — Assessment & Plan Note (Signed)
 Not exacerbated. On coreg  and prn lasix . Follows with [facility] cards Dr. Anner. No mention of starting Entresto last office visit 03-2023

## 2023-12-08 NOTE — Progress Notes (Signed)
 Pt admitted to [facility] program.Pt was transported by Logan Mantle, EMT via [facility] wheelchair [place]. Pt unloaded from same via wheelchair without incident. Pt able to stand and walk inside residence without assistance. Pt has a 3 steps into residence via front door which she had no trouble ambulating up. Pt spends majority of her time in living room, located just inside the front door. Current health remote monitoring equipment all set up. Pt shown how to make and receive calls on tablet. Pt shown welcome binder and how to call virtual RN via hub number. Nebulizer machine set up. Pt has home neb machine and feels comfortable using ours. Clear plastic medication bins set up and medications placed into appropriate bins.  Home safety check completed. Pt spends majority of her time in living room. Pt has a clear walkway to the bathroom and no hazards noted. Pt will avoid regular shower for tonight in case of weakness, however she denies weakness right now.  2 person skin assessment completed with Shanda McWhite, RN. Pt refused to let us  visualize skin on back or sacral area.  Vital signs and assessment obtained as noted. Lungs sounds are clear but diminished in all fields.   Medications administered as noted. IV care performed including new curos cap.  Pt reminded she must call the RN on the tablet in order to take any medications. Pt states she understand sand feels comfortable taking her bedtime medications with the virtual RN and does not need another visit from Paramedic tonight.   IMM letter signed, one copy left with patient, other copy placed in Pt's shadow chart at [facility] hub.  Pt encouraged to call with any problems or concerns. Pt informed a paramedic will be out in the morning for her next visit/ possible d/c.

## 2023-12-08 NOTE — Progress Notes (Signed)
 Phone call completed with patient. Introduced myself as the Charity fundraiser for the night and encouraged patient to reach out via phone or tablet if any needs arise overnight. Plan made to do medication call around 22:00. Patient denies any pain or other needs at this time.

## 2023-12-08 NOTE — Progress Notes (Signed)
 Patient transferred to the [facility] program. Communicated with patient via video tablet. AAOx4. No complaints of pain or SOB. Plan of care reviewed with patient. Patient was told that the virtual nurse is available 24/7. [facility] phone number given to patient. Tablet usage explained. Patient refused skin assessment of the buttocks/sacrum area.   Patient agreeable with plan of care.

## 2023-12-08 NOTE — Plan of Care (Signed)
 Progress Note    April Combs   FMW:[id number]  DOB: 08/08/1945  DOA: 12/06/2023     2 Date of Service: 12/08/2023   Brief Narrative:  April Combs is a 78 y.o. female with medical history significant of with history of COPD,prior tobacco use, hypertension, hyperlipidemia, coronary artery disease, HFmrEF, hypothyroidism who presented to the Emergency Department from home with complaint of shortness of breath , productive cough, dizziness, fever, chills. Lab work showed leukocytosis in the range of 14k , creatinine of 1.1. Chest x-ray showed faint patchy opacities in the bilateral lower lungs, right greater than left.  Patient admitted for the management of multifocal pneumonia.  Started on broad spectrum antibiotics.  Blood culture sent.  COVID/flu/RSV negative. She was treated with a dose of azithromycin , ceftriaxone  and Solu-Medrol .  Significantly improved clinically now.  PT/OT did not recommend follow-up.   At present, pt feels clinically much improved. Still with some wheezing and cough on evaluation. Give PSI 4, initial discussion was for additional IV antibiotics vs. Oral regimen. However, pt states that she has an important wedding to go to this weekend and was subsequently requesting discharge home. Lengthy discussion with patient as well as daughter over the phone about the hospital at home programs ability to give additonal IV antibiotics. Pt is agreeable to the hospital at home program for an additional 24 hours of IV antibiotics for multifocal PNA coverage in setting of COPD, CHF, stage 3 CKD.   Subjective:  Improvement noted in cough, wheezing, SOB  Hospital Problems Assessment and Plan:  Multifocal pneumonia: Initially presented with shortness of breath, wheezing,  productive cough and fever.   Initial PSI score 4  Initial procalcitonin 1.74 Mild leukocytosis,has resolved.   Satting well on room air.  Chest x-ray  showed multifocal pneumonia. Started on  broad-spectrum antibiotics with azithromycin  and ceftriaxone - will continue for an additional 24 hours under the hospital at home program.  Pending Legionella,negative urine streptococcal antigen.  blood cultures: NGTD. Plan to transition to oral regimen in am.    COPD exacerbation: Follows with pulmonology at [facility].  History of prior tobacco use and states the she has not used in several years.  Noted history of pulm nodules.  On Trelegy at home.  Continue duonebs  S/p IV steroids    On oral prednisone  with stop date of 9/26    Chronic combined systolic/diastolic CHF: Last echo done on 06/2022 showed EF of 45%, grade 1 diastolic dysfunction.  She follows with cardiology.  Takes Coreg , Lasix  at home.   Laisx initially held with some concern for dehydration on presentation  Resume on discharge.  Resumed low-dose Coreg    Hypertension:  SBP 110s-130s  Takes coreg ,lisinopril  - hydrochlorothiazide  at home. Coreg  resumed at low-dose.   Consider holding lisinopril -hydrochlorothiazide  as BP/renal function allows    Hypothyroidism: Continue Synthyroid   Hyperlipidemia: Continue statin   History of coronary artery disease: No anginal symptoms.  Takes aspirin , statin , Coreg  at home.  Being continued   History of iron deficiency anemia: Follows with hematology, Dr. Sherrod.  Currently hemoglobin stable   CKD stage IIIa: Baseline creatinine around 1.3.  Currently kidney function at baseline   GERD: Continue PPI   History of gout: Takes allopurinol  at home   Diabetes type 2: Recent A1c of 5.7.    Blood sugars 180s in setting of steroid use.  Monitor  Takes semaglutide  at home   Deconditioning/debility: Ambulates without any difficulty at baseline.  Was dizzy at  home.  Consulted PT,no follow up recommended      Objective Vital signs were reviewed and unremarkable. Vitals:   12/07/23 1936 12/07/23 2004 12/08/23 0550 12/08/23 0742  BP:  (!) 112/55 (!) 125/93   Pulse:   (!) 106 72   Temp:  98.2 F (36.8 C) 97.8 F (36.6 C)   Resp:  18 19   Height:      Weight:      SpO2: 96% 95% 94% 95%  TempSrc:  Oral Oral   BMI (Calculated):        Exam Physical Exam Constitutional:      Appearance: She is obese.  HENT:     Head: Normocephalic and atraumatic.  Cardiovascular:     Rate and Rhythm: Normal rate and regular rhythm.  Pulmonary:     Effort: Pulmonary effort is normal.     Comments: Trace wheezes and rales on exam   Abdominal:     General: Bowel sounds are normal.  Musculoskeletal:        General: Normal range of motion.     Cervical back: Normal range of motion.  Skin:    General: Skin is warm.  Neurological:     General: No focal deficit present.  Psychiatric:        Mood and Affect: Mood normal.      Labs / Other Information There are no new results to review at this time. DG Chest 2 View CLINICAL DATA:  Cough.  EXAM: DG CHEST 2V  COMPARISON:  Chest x-ray 06/27/2022  FINDINGS: There some faint patchy opacities in the bilateral lower lungs, right greater than left. The left lower lung opacity is slightly nodular. There is a small left pleural effusion. There is no pneumothorax. No acute fractures are seen. There surgical clips in the upper abdomen.  IMPRESSION: 1. Faint patchy opacities in the bilateral lower lungs, right greater than left. The left lower lung opacity is slightly nodular. Findings are concerning for infection. Follow-up chest x-ray recommended in 4-6 weeks to confirm resolution. 2. Small left pleural effusion.  Electronically Signed   By: Greig Pique M.D.   On: 12/06/2023 15:11  Lab Results  Component Value Date   WBC 9.6 12/07/2023   HGB 10.5 (L) 12/07/2023   HCT 31.2 (L) 12/07/2023   MCV 97.2 12/07/2023   PLT 191 12/07/2023   Last metabolic panel Lab Results  Component Value Date   GLUCOSE 244 (H) 12/07/2023   NA 135 12/07/2023   K 3.7 12/07/2023   CL 101 12/07/2023   CO2 20 (L)  12/07/2023   BUN 27 (H) 12/07/2023   CREATININE 0.92 12/07/2023   GFRNONAA >60 12/07/2023   CALCIUM  8.0 (L) 12/07/2023   PROT 5.9 (L) 08/25/2023   ALBUMIN 4.1 02/24/2023   BILITOT 0.5 08/25/2023   ALKPHOS 67 02/24/2023   AST 24 08/25/2023   ALT 20 08/25/2023   ANIONGAP 13 12/07/2023     Hospital at Home Admission Criteria Checklist:  Formal consent explained in detail and signed at the bedside: yes Patient meets inpatient admission criteria (see below for further details) yes Is pt Medicare FFS/[employer] Medicare-Medicaid, [employer], [employer] ( required for initial launch with plan to expand)? yes Lives within 25 mil/ 30 min from [facility] within [place](pt may stay with family member during admission who lives within 25 miles or 30 min from [facility] w[facility]in [facility])? yes Hemodynamically stable with relatively low risk of clinical deterioration-not requiring ICU? yes Age >55? yes Does  not require frequent touch-points or complex interventions/medications (ie Titrated Infusions (IV insulin , heparin  drips, vasoactive drips, use of infused or injectable controlled substances, patients on insulin )? yes Any Behavioral Health comorbidities likely to increase risk for in-home care (ie Acute delirium or experiencing a marked altered mental status and cause is not a treatable condition in the home)? no Has the patient been on BIPAP during course of ED evaluation or hospitalization? no IF YES, Has the patient been off of BIPAP for >24 hours(If NO-THEN PATIENT DOES MEET INCLUSION CRITERIA)? not applicable On Room Air or Needs oxygen at home (<6L)? is not on home oxygen therapy. Active safety concerns (ie Unable to use bedside commode independently and lacks caregiver support for safety- needs SNF placement, unable to obtain IV access)? no Has skin check been performed? yes  Has Physical Therapy screened the patient? yes  Common admission diagnoses including: CAP, COPD Exacerbation, Acute on  chronic heart failure, Cellulitis, UTI , dehydration, acute resp failure with hypoxia (requiring <5L)   Social Screening:  - Has the family been directly contacted about Hospital at Home program with consent obtained (if yes- please document who was spoken to with name and phone number)? yes  -Was the family approached about the use of TOC pharmacy for medications at discharge? no Denies significant ETOH intake? no Does not smoke and understands may not smoke in the presence of oxygen? yes Patient states able to use iPad/phone for communication/has family who is able to use? yes Patient has agreed to be compliant with medication and treatment regimen of the program? yes Any active drug use in patient or primary caregiver including daily dosing of methadone? no Stable home environment ( access to appropriate heating in cold conditions and/or appropriate air conditioning in hot conditions and/or no running water/electricity)? yes  No aggressive pets at home? no Firearm present? no  With ability or willingness to store them unloaded in a locked case for duration of hospitalization? yes Ambulatory? yes  no difficulty Bed bugs present on home evaluation? no Family support system in place? yes Patient feels safe at home and does not endorse any violence? yes Any actively decompensated behavioral health issues including agitation/aggressive behavior? no  Patient requests food to be provided by hospital home program? no PT/OT eval completed and not requiring SNF, ALF, inpatient rehab? yes  To be admitted to the Hospital at [facility] program, a patient generally must meet the following: 1. Requirement for Inpatient Level of Care: The patient's condition must necessitate an inpatient level of care. This is typically indicated by one or more of the following, depending on their specific diagnosis:  Persistent tachycardia despite appropriate treatment (e.g., for Heart Failure, UTI). Persistent tachypnea  (rapid breathing) or dyspnea (shortness of breath) that hasn't improved sufficiently with observation care (e.g., for Heart Failure, Pneumonia, Viral Illness, COVID). Hypoxemia (low oxygen levels), such as a new need for oxygen, an increased need from baseline, or specific oxygen saturation levels (e.g., SpO2 <90-94% depending on the condition) that persist despite observation (e.g., for Heart Failure, COPD, Pneumonia, Viral Illness, COVID). Need for Intravenous (IV) hydration due to an inability to maintain oral hydration, which persists despite observation care (e.g., for Cellulitis, UTI, Viral Illness, COVID). Specific to Heart Failure: Persistent pulmonary edema, indicated by a new oxygen need, lack of improvement with IV diuretics, and ongoing tachypnea/dyspnea. Specific to COPD: A decrease in known baseline resting oxygen saturation (SpO2) by 4% or more, or an increase in pre-existing supplemental oxygen requirements, which persists  despite observation and requires continued close monitoring. Specific to Pneumonia: A Pneumonia Severity Index (PSI) class IV (moderate risk). Specific to Cellulitis: Failure of outpatient antibiotic therapy (indicated by progression or no improvement after a minimum of 48 hours on an adequate regimen) or a clinical presentation (like acuity or rapidity of progression) that requires the intensity of monitoring found in an inpatient setting. Specific to UTI: Persistence or worsening of clinical findings like fever, pain, or dehydration despite observation care; presence of significant uropathy; suspected infection of an indwelling prosthetic device, stent, implant, or graft; or pregnancy with suspected pyelonephritis.  2. Appropriateness for Hospital at Home Setting: The patient's overall clinical picture, including the severity of their illness, their care needs, and their medical history and comorbidities, must be suitable for management in the Hospital at Home  environment. This essentially means that none of the exclusion criteria (listed below) are met.  Unified Exclusion Criteria for Hospital at Home Admission: A patient would not be eligible for Hospital at Home if any of the following are present: Hemodynamic Instability: Hypotension (low blood pressure) is present. Respiratory Instability or Needs Beyond Program Capability: There is a new need for invasive or noninvasive ventilatory assistance (like BiPAP or a ventilator). Oxygenation is not sufficient, generally indicated if an FiO2 (fraction of inspired oxygen) of 45% (which is about 6 Liters/minute via nasal cannula) or more is required to keep oxygen saturation (SpO2) at 90% or greater. Monitoring or Procedural Needs Beyond Program Capability: There is a need for invasive monitoring, such as a pulmonary artery catheter or an arterial line. There is a need for immediate-response telemetry monitoring (for dangerous arrhythmia detection and subsequent immediate intervention). The required medication regimen is beyond the capabilities of Hospital at Home (e.g., dosing intervals are too frequent for home administration). There is a need for a procedure that cannot be performed by the Hospital at [facility] team (e.g., significant wound debridement or abscess drainage for cellulitis, or percutaneous nephrostomy for a complicated UTI). Significant Organ Dysfunction or Markers of Severe Illness: Mental status is not at baseline, or there is altered mental status suggestive of inadequate perfusion. Renal (kidney) function is unstable or showing an ongoing decline. There is evidence of inadequate perfusion, such as metabolic acidosis or myocardial ischemia. Uncompensated acidosis is present. Condition-Specific Severity or Complications Making Home Care Unsuitable: For Heart Failure: Known severe cardiac valvular disease (e.g., aortic stenosis, mitral regurgitation); or severe peripheral edema that impairs  the ability to urinate or ambulate. For COPD: Known concurrent comorbidity or finding that indicates a higher-risk COPD exacerbation (e.g., pulmonary fibrosis, cavitation, pleural effusion, pneumothorax, rib fracture). For Pneumonia: Pneumonia Severity Index (PSI) class V (indicating high risk for inpatient mortality); known concurrent comorbidity or finding that indicates higher-risk pneumonia (e.g., pulmonary fibrosis, cavitation, large or loculated pleural effusion); or a concomitant serious infectious process like endocarditis or empyema. For Cellulitis: Orbital, periorbital, or necrotizing infection is suspected; or a concomitant serious infectious process like endocarditis, septic emboli, or septic joint space infection. For UTI: Urinary tract obstruction (e.g., kidney stone, bladder outlet obstruction); or a concomitant serious infectious process like endocarditis or septic emboli. For Viral Illness & COVID-19: A concomitant serious infectious process like endocarditis or empyema.  General Comorbidities or Status:  The patient is significantly immunosuppressed (this applies to Pneumonia, Cellulitis, UTI, Viral Illness, and COVID-19). The patient meets inpatient admission criteria for a second diagnosis, or has care needs beyond the capabilities of Hospital at Home due to an active clinically significant comorbidity. (This  is a general exclusion across all listed conditions)    Time spent: >60 minutes  [facility] 12/08/2023, 12:21 PM

## 2023-12-08 NOTE — Assessment & Plan Note (Signed)
 Stable on synthroid 

## 2023-12-08 NOTE — Assessment & Plan Note (Signed)
 Baseline Scr 1.2-1.3.  f/u with PCP to make sure that ACEI/hydrochlorothiazide  is the correct HTN meds for CKD stage 3a

## 2023-12-08 NOTE — Assessment & Plan Note (Signed)
 Body mass index is 30.85 kg/m.

## 2023-12-08 NOTE — Hospital Course (Signed)
 CC: cough, headache, fever HPI: April Combs is a 78 y.o. female with medical history significant of with history of COPD,prior tobacco use, hypertension, hyperlipidemia, coronary artery disease, HFmrEF, hypothyroidism who presented to the Emergency Department from home with complaint of shortness of breath.  Symptoms started about a week ago, worsened today.  Report of feeling lightheaded and weakness at home. She became dyspnea on minimal exertion.  Also reported sore throat, cough, chills,wheezing, dizziness .  She also reported a fever of 102 Fahrenheit last weekend.  She was bringing up phlegm which was greenish/brownish in color. Due to worsening of her symptoms, patient was seen at urgent care earlier this morning and was found to be tachypneic and hypoxic.  She was then recommended to go to the emergency department. On presentation, she was in sinus tachycardia with a stable blood pressure.  She appeared short of breath.  She was wheezing. Patient seen and examined at bedside in the emergency department.  During my evaluation, she was overall comfortable, lying in bed.  EKG monitor showed sinus tachycardia with a stable blood pressure.  She was on room air , speaking in full sentences.  She says she feels better after she was given breathing treatment and antibiotics. Patient denies any chest pain, abdomen pain, dysuria, chills, vomiting, diarrhea, loss of consciousness. She lives alone and ambulates without any problem.     ED Course: Lab work showed leukocytosis in the range of 14k , creatinine of 1.1.  Also appeared dehydrated and was given IV fluid.  She was maintaining her saturation on room air.  Chest x-ray showed faint patchy opacities in the bilateral lower lungs, right greater than left.  Patient admitted for the management of multifocal pneumonia.  Started on broad spectrum antibiotics.  Blood culture sent.  COVID/flu/RSV negative. She was treated with a dose of azithromycin ,  ceftriaxone  and Solu-Medrol .  Patient was requested for admission of multifocal pneumonia.  Significant Events: Admitted 12/06/2023 for multifocal pneumonia 12-08-2023 transferred to [facility] program to complete IV abx at home.  Admission Labs: WBC 14.5, HgB 11.7, plt 210 Na 133, K 3.9, CO2 of 21, BUN 31, Scr 1.16, glu 103 Covid/RSV/flu negative Lactic acid 1.2 Procalcitonin 1.74 Strep Pneumo antigen negative  Admission Imaging Studies: CXR Faint patchy opacities in the bilateral lower lungs, right greater than left. The left lower lung opacity is slightly nodular. Findings are concerning for infection. Follow-up chest x-ray recommended in 4-6 weeks to confirm resolution. 2. Small left pleural effusion.  Significant Labs:   Significant Imaging Studies:   Antibiotic Therapy: Anti-infectives (From admission, onward)    Start     Dose/Rate Route Frequency Ordered Stop   12/07/23 1600  cefTRIAXone  (ROCEPHIN ) 2 g in sodium chloride  0.9 % 100 mL IVPB        2 g 200 mL/hr over 30 Minutes Intravenous Every 24 hours 12/06/23 1827 12/11/23 1559   12/07/23 1600  azithromycin  (ZITHROMAX ) tablet 500 mg        500 mg Oral Daily 12/06/23 1827 12/11/23 1559   12/06/23 1830  cefTRIAXone  (ROCEPHIN ) 1 g in sodium chloride  0.9 % 100 mL IVPB  Status:  Discontinued        1 g 200 mL/hr over 30 Minutes Intravenous  Once 12/06/23 1827 12/08/23 1106   12/06/23 1530  cefTRIAXone  (ROCEPHIN ) 1 g in sodium chloride  0.9 % 100 mL IVPB        1 g 200 mL/hr over 30 Minutes Intravenous  Once 12/06/23 1516 12/06/23  1722   12/06/23 1530  azithromycin  (ZITHROMAX ) 500 mg in sodium chloride  0.9 % 250 mL IVPB        500 mg 250 mL/hr over 60 Minutes Intravenous  Once 12/06/23 1516 12/06/23 1722       Procedures:   Consultants:

## 2023-12-08 NOTE — Assessment & Plan Note (Signed)
 Follows with [facility] pulmonology. On Trelegy 100-62.5-25 inhaler, 1 puff daily at home.

## 2023-12-08 NOTE — Progress Notes (Signed)
 PROGRESS NOTE  April Combs  FMW:[id number] DOB: Feb 12, 1946 DOA: 12/06/2023 PCP: April Combs   Brief Narrative: April Combs is a 78 y.o. female with medical history significant of with history of COPD,prior tobacco use, hypertension, hyperlipidemia, coronary artery disease, HFmrEF, hypothyroidism who presented to the Emergency Department from home with complaint of shortness of breath , productive cough, dizziness, fever, chills. Lab work showed leukocytosis in the range of 14k , creatinine of 1.1. Chest x-ray showed faint patchy opacities in the bilateral lower lungs, right greater than left.  Patient admitted for the management of multifocal pneumonia.  Started on broad spectrum antibiotics.  Blood culture sent.  COVID/flu/RSV negative. She was treated with a dose of azithromycin , ceftriaxone  and Solu-Medrol .  Significantly improved clinically now.  PT/OT did not recommend follow-up.  Plan for discharge with hospital at home program.  Assessment & Plan:  Principal Problem:   Multifocal pneumonia Active Problems:   CAD (coronary artery disease)   Essential hypertension   Hypothyroidism   Hyperlipidemia with target LDL less than 100   Pulmonary emphysema (HCC)   Hypertensive 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 (HCC)   CKD (chronic kidney disease) stage 3, GFR 30-59 ml/min (HCC)   Chronic combined systolic and diastolic heart failure (HCC)   Multifocal pneumonia: Presented with shortness of breath, wheezing, productive cough and fever.  Mild leukocytosis,has resolved.  Maintaining saturation on room air.  Lactic acid level normal.  Chest x-ray  showed multifocal pneumonia. Started on broad-spectrum antibiotics with azithromycin  and ceftriaxone . Pending Legionella,negative urine streptococcal antigen.  blood cultures: NGTD. She is being enrolled with hospital at home program   COPD exacerbation: Follows with pulmonology at [facility] system.   History of prior tobacco use.  Has history of pulm nodules.  On Trelegy at home.  Continue bronchodilators as needed.  Given a dose of Solu-Medrol  daily.  Continue prednisone  for 4 more days to finish the course.   Chronic combined systolic/diastolic CHF: Last echo done on 06/2022 showed EF of 45%, grade 1 diastolic dysfunction.  She follows with cardiology.  Takes Coreg , Lasix  at home.  Lasix   held because he appeared dehydrated on presentation, can resume on discharge. Resumed low-dose Coreg    Hypertension: Currently stable.  Takes coreg ,lisinopril  - hydrochlorothiazide  at home.  Currently normotensive.  Coreg  resumed at low-dose.  Can hold lisinopril -hydrochlorothiazide  on discharge   Hypothyroidism: Continue Synthyroid   Hyperlipidemia: Continue statin   History of coronary artery disease: No anginal symptoms.  Takes aspirin , statin , Coreg  at home.  Being continued   History of iron deficiency anemia: Follows with hematology, April Combs.  Currently hemoglobin stable   CKD stage IIIa: Baseline creatinine around 1.3.  Currently kidney function at baseline   GERD: Continue PPI   History of gout: Takes allopurinol  at home   Diabetes type 2: Recent A1c of 5.7.    Takes semaglutide  at home   Deconditioning/debility: Ambulates without any difficulty at baseline.  Was dizzy at home.  Consulted PT,no follow up recommended        DVT prophylaxis:heparin  injection 5,000 Units Start: 12/06/23 2200     Code Status: Full Code  Family Communication: Daughter April Combs on phone on 9/23  Patient status: Inpatient  Patient is from : Home  Anticipated discharge to: Home with hospital at home program  Estimated DC date: Today   Consultants: None  Procedures: None  Antimicrobials:  Anti-infectives (From admission, onward)    Start  Dose/Rate Route Frequency Ordered Stop   12/07/23 1600  cefTRIAXone  (ROCEPHIN ) 2 g in sodium chloride  0.9 % 100 mL IVPB        2 g 200 mL/hr over 30  Minutes Intravenous Every 24 hours 12/06/23 1827 12/11/23 1559   12/07/23 1600  azithromycin  (ZITHROMAX ) tablet 500 mg        500 mg Oral Daily 12/06/23 1827 12/11/23 1559   12/06/23 1830  cefTRIAXone  (ROCEPHIN ) 1 g in sodium chloride  0.9 % 100 mL IVPB        1 g 200 mL/hr over 30 Minutes Intravenous  Once 12/06/23 1827     12/06/23 1530  cefTRIAXone  (ROCEPHIN ) 1 g in sodium chloride  0.9 % 100 mL IVPB        1 g 200 mL/hr over 30 Minutes Intravenous  Once 12/06/23 1516 12/06/23 1722   12/06/23 1530  azithromycin  (ZITHROMAX ) 500 mg in sodium chloride  0.9 % 250 mL IVPB        500 mg 250 mL/hr over 60 Minutes Intravenous  Once 12/06/23 1516 12/06/23 1722       Subjective: Patient seen and examined at bedside today.  Very comfortable today.  Still having some cough but feels significantly better.  Not shortness of breath.  On room air.  Really wants to go home today.  Objective: Vitals:   12/07/23 1936 12/07/23 2004 12/08/23 0550 12/08/23 0742  BP:  (!) 112/55 (!) 125/93   Pulse:  (!) 106 72   Resp:  18 19   Temp:  98.2 F (36.8 C) 97.8 F (36.6 C)   TempSrc:  Oral Oral   SpO2: 96% 95% 94% 95%  Weight:      Height:        Intake/Output Summary (Last 24 hours) at 12/08/2023 1058 Last data filed at 12/08/2023 0455 Gross per 24 hour  Intake 400 ml  Output --  Net 400 ml   Filed Weights   12/06/23 1404  Weight: 88.5 kg    Examination:   General exam: Overall comfortable, not in distress, pleasant elderly female HEENT: PERRL Respiratory system:  no wheezes or crackles  Cardiovascular system: S1 & S2 heard, RRR.  Gastrointestinal system: Abdomen is nondistended, soft and nontender. Central nervous system: Alert and oriented Extremities: No edema, no clubbing ,no cyanosis Skin: No rashes, no ulcers,no icterus     Data Reviewed: I have personally reviewed following labs and imaging studies  CBC: Recent Labs  Lab 12/06/23 1417 12/07/23 0526  WBC 14.5* 9.6   NEUTROABS 12.4*  --   HGB 11.7* 10.5*  HCT 35.7* 31.2*  MCV 98.9 97.2  PLT 210 191   Basic Metabolic Panel: Recent Labs  Lab 12/06/23 1417 12/07/23 0526  NA 133* 135  K 3.9 3.7  CL 96* 101  CO2 21* 20*  GLUCOSE 103* 244*  BUN 31* 27*  CREATININE 1.16* 0.92  CALCIUM  8.9 8.0*     Recent Results (from the past 240 hours)  Blood culture (routine x 2)     Status: None (Preliminary result)   Collection Time: 12/06/23  3:20 PM   Specimen: BLOOD  Result Value Ref Range Status   Specimen Description   Final    BLOOD LEFT ANTECUBITAL Performed at [facility], [address] [place], [place] [address]    Special Requests   Final    BOTTLES DRAWN AEROBIC AND ANAEROBIC Blood Culture adequate volume Performed at [facility], [address] [place], [place]  [address]    Culture   Final    NO GROWTH 2 DAYS Performed at [facility], [address] [place], [place] [address]    Report Status PENDING  Incomplete  Resp panel by RT-PCR (RSV, Flu A&B, Covid) Anterior Nasal Swab     Status: None   Collection Time: 12/06/23  3:35 PM   Specimen: Anterior Nasal Swab  Result Value Ref Range Status   SARS Coronavirus 2 by RT PCR NEGATIVE NEGATIVE Final    Comment: (NOTE) SARS-CoV-2 target nucleic acids are NOT DETECTED.  The SARS-CoV-2 RNA is generally detectable in upper respiratory specimens during the acute phase of infection. The lowest concentration of SARS-CoV-2 viral copies this assay can detect is 138 copies/mL. A negative result does not preclude SARS-Cov-2 infection and should not be used as the sole basis for treatment or other patient management decisions. A negative result may occur with  improper specimen collection/handling, submission of specimen other than nasopharyngeal swab, presence of viral mutation(s) within the areas targeted by this assay, and inadequate number of viral copies(<138 copies/mL). A negative result  must be combined with clinical observations, patient history, and epidemiological information. The expected result is Negative.  Fact Sheet for Patients:  BloggerCourse.com  Fact Sheet for Healthcare Providers:  SeriousBroker.it  This test is no t yet approved or cleared by the [place]  FDA and  has been authorized for detection and/or diagnosis of SARS-CoV-2 by FDA under an Emergency Use Authorization (EUA). This EUA will remain  in effect (meaning this test can be used) for the duration of the COVID-19 declaration under Section 564(b)(1) of the Act, 21 U.S.C.section 360bbb-3(b)(1), unless the authorization is terminated  or revoked sooner.       Influenza A by PCR NEGATIVE NEGATIVE Final   Influenza B by PCR NEGATIVE NEGATIVE Final    Comment: (NOTE) The Xpert Xpress SARS-CoV-2/FLU/RSV plus assay is intended as an aid in the diagnosis of influenza from Nasopharyngeal swab specimens and should not be used as a sole basis for treatment. Nasal washings and aspirates are unacceptable for Xpert Xpress SARS-CoV-2/FLU/RSV testing.  Fact Sheet for Patients: BloggerCourse.com  Fact Sheet for Healthcare Providers: SeriousBroker.it  This test is not yet approved or cleared by the [place]  FDA and has been authorized for detection and/or diagnosis of SARS-CoV-2 by FDA under an Emergency Use Authorization (EUA). This EUA will remain in effect (meaning this test can be used) for the duration of the COVID-19 declaration under Section 564(b)(1) of the Act, 21 U.S.C. section 360bbb-3(b)(1), unless the authorization is terminated or revoked.     Resp Syncytial Virus by PCR NEGATIVE NEGATIVE Final    Comment: (NOTE) Fact Sheet for Patients: BloggerCourse.com  Fact Sheet for Healthcare Providers: SeriousBroker.it  This test is  not yet approved or cleared by the [place]  FDA and has been authorized for detection and/or diagnosis of SARS-CoV-2 by FDA under an Emergency Use Authorization (EUA). This EUA will remain in effect (meaning this test can be used) for the duration of the COVID-19 declaration under Section 564(b)(1) of the Act, 21 U.S.C. section 360bbb-3(b)(1), unless the authorization is terminated or revoked.  Performed at [facility], [address] [place], [place] [address]   Blood culture (routine x 2)     Status: None (Preliminary result)   Collection Time: 12/06/23  3:35 PM   Specimen: BLOOD  Result Value Ref Range Status   Specimen Description   Final    BLOOD  RIGHT ANTECUBITAL Performed at [facility], [address], [place], [place] [address]    Special Requests   Final    BOTTLES DRAWN AEROBIC AND ANAEROBIC Blood Culture adequate volume Performed at [facility], [address] [place], [place] [address]    Culture   Final    NO GROWTH 2 DAYS Performed at [facility], [address], [place], [place] [address]    Report Status PENDING  Incomplete     Radiology Studies: DG Chest 2 View Result Date: 12/06/2023 CLINICAL DATA:  Cough. EXAM: DG CHEST 2V COMPARISON:  Chest x-ray 06/27/2022 FINDINGS: There some faint patchy opacities in the bilateral lower lungs, right greater than left. The left lower lung opacity is slightly nodular. There is a small left pleural effusion. There is no pneumothorax. No acute fractures are seen. There surgical clips in the upper abdomen. IMPRESSION: 1. Faint patchy opacities in the bilateral lower lungs, right greater than left. The left lower lung opacity is slightly nodular. Findings are concerning for infection. Follow-up chest x-ray recommended in 4-6 weeks to confirm resolution. 2. Small left pleural effusion. Electronically Signed   By: Greig Pique M.D.   On: 12/06/2023 15:11     Scheduled Meds:  albuterol   2.5 mg Nebulization BID   allopurinol   300 mg Oral Daily   aspirin  EC  81 mg Oral Daily   atorvastatin   40 mg Oral Daily   azithromycin   500 mg Oral Daily   budesonide -glycopyrrolate -formoterol   2 puff Inhalation BID   carvedilol   3.125 mg Oral BID WC   gabapentin   200 mg Oral BID   guaiFENesin   600 mg Oral BID   heparin   5,000 Units Subcutaneous Q8H   insulin  aspart  0-9 Units Subcutaneous TID WC   levothyroxine   50 mcg Oral Daily   pantoprazole   40 mg Oral Daily   polyethylene glycol  17 g Oral Daily   predniSONE   40 mg Oral Q breakfast   Continuous Infusions:  cefTRIAXone  (ROCEPHIN )  IV Stopped (12/06/23 2150)   cefTRIAXone  (ROCEPHIN )  IV Stopped (12/07/23 1742)     LOS: 2 days   Ivonne Mustache, Combs [facility] P9/25/2025, 10:58 AM

## 2023-12-08 NOTE — Assessment & Plan Note (Signed)
 Stable on ASA, lipitor, coreg 

## 2023-12-08 NOTE — Plan of Care (Signed)
  Problem: Education: Goal: Knowledge of General Education information will improve Description: Including pain rating scale, medication(s)/side effects and non-pharmacologic comfort measures Outcome: Progressing   Problem: Health Behavior/Discharge Planning: Goal: Ability to manage health-related needs will improve Outcome: Progressing   Problem: Clinical Measurements: Goal: Ability to maintain clinical measurements within normal limits will improve Outcome: Progressing Goal: Diagnostic test results will improve Outcome: Progressing Goal: Respiratory complications will improve Outcome: Progressing Goal: Cardiovascular complication will be avoided Outcome: Progressing   Problem: Activity: Goal: Risk for activity intolerance will decrease Outcome: Progressing   Problem: Nutrition: Goal: Adequate nutrition will be maintained Outcome: Progressing   Problem: Coping: Goal: Level of anxiety will decrease Outcome: Progressing   Problem: Pain Managment: Goal: General experience of comfort will improve and/or be controlled Outcome: Progressing   Problem: Safety: Goal: Ability to remain free from injury will improve Outcome: Progressing

## 2023-12-08 NOTE — Assessment & Plan Note (Signed)
 Continue with prednisone  40 mg qday to complete 7 days of steroid therapy. Zithromax  500 mg qday for 4 additional days. Duonebs q6h prn and tessalon  perles for cough

## 2023-12-09 ENCOUNTER — Other Ambulatory Visit (HOSPITAL_COMMUNITY): Payer: Self-pay

## 2023-12-09 DIAGNOSIS — E039 Hypothyroidism, unspecified: Secondary | ICD-10-CM

## 2023-12-09 DIAGNOSIS — I5042 Chronic combined systolic (congestive) and diastolic (congestive) heart failure: Secondary | ICD-10-CM

## 2023-12-09 DIAGNOSIS — I10 Essential (primary) hypertension: Secondary | ICD-10-CM

## 2023-12-09 DIAGNOSIS — N1831 Chronic kidney disease, stage 3a: Secondary | ICD-10-CM

## 2023-12-09 DIAGNOSIS — J441 Chronic obstructive pulmonary disease with (acute) exacerbation: Secondary | ICD-10-CM

## 2023-12-09 LAB — LEGIONELLA PNEUMOPHILA SEROGP 1 UR AG: L. pneumophila Serogp 1 Ur Ag: NEGATIVE

## 2023-12-09 NOTE — Discharge Summary (Signed)
 [facility] Phys[facility] Discharge Summary   Patient name: April Combs  Admit date:     12/06/2023  Discharge date: 12/09/2023  Attending Physician: ADHIKARI, AMRIT [[id number]]  Discharge Physician: Camellia Door   PCP: Geofm Glade PARAS, MD  Admitted From: Home  Disposition:  Home  Recommendations for Outpatient Follow-up:  Follow up with PCP in 1-2 weeks  Home Health:No Equipment/Devices: None    Discharge Condition:Stable CODE STATUS:FULL Diet recommendation: Heart Healthy Fluid Restriction: None  Hospital Summary: CC: cough, headache, fever HPI: April Combs is a 78 y.o. female with medical history significant of with history of COPD,prior tobacco use, hypertension, hyperlipidemia, coronary artery disease, HFmrEF, hypothyroidism who presented to the Emergency Department from home with complaint of shortness of breath.  Symptoms started about a week ago, worsened today.  Report of feeling lightheaded and weakness at home. She became dyspnea on minimal exertion.  Also reported sore throat, cough, chills,wheezing, dizziness .  She also reported a fever of 102 Fahrenheit last weekend.  She was bringing up phlegm which was greenish/brownish in color. Due to worsening of her symptoms, patient was seen at urgent care earlier this morning and was found to be tachypneic and hypoxic.  She was then recommended to go to the emergency department. On presentation, she was in sinus tachycardia with a stable blood pressure.  She appeared short of breath.  She was wheezing. Patient seen and examined at bedside in the emergency department.  During my evaluation, she was overall comfortable, lying in bed.  EKG monitor showed sinus tachycardia with a stable blood pressure.  She was on room air , speaking in full sentences.  She says she feels better after she was given breathing treatment and antibiotics. Patient denies any chest pain, abdomen pain, dysuria, chills, vomiting, diarrhea, loss  of consciousness. She lives alone and ambulates without any problem.     ED Course: Lab work showed leukocytosis in the range of 14k , creatinine of 1.1.  Also appeared dehydrated and was given IV fluid.  She was maintaining her saturation on room air.  Chest x-ray showed faint patchy opacities in the bilateral lower lungs, right greater than left.  Patient admitted for the management of multifocal pneumonia.  Started on broad spectrum antibiotics.  Blood culture sent.  COVID/flu/RSV negative. She was treated with a dose of azithromycin , ceftriaxone  and Solu-Medrol .  Patient was requested for admission of multifocal pneumonia.  Significant Events: Admitted 12/06/2023 for multifocal pneumonia 12-08-2023 transferred to [facility] program to complete IV abx at home.  Admission Labs: WBC 14.5, HgB 11.7, plt 210 Na 133, K 3.9, CO2 of 21, BUN 31, Scr 1.16, glu 103 Covid/RSV/flu negative Lactic acid 1.2 Procalcitonin 1.74 Strep Pneumo antigen negative  Admission Imaging Studies: CXR Faint patchy opacities in the bilateral lower lungs, right greater than left. The left lower lung opacity is slightly nodular. Findings are concerning for infection. Follow-up chest x-ray recommended in 4-6 weeks to confirm resolution. 2. Small left pleural effusion.  Significant Labs:   Significant Imaging Studies:   Antibiotic Therapy: Anti-infectives (From admission, onward)    Start     Dose/Rate Route Frequency Ordered Stop   12/07/23 1600  cefTRIAXone  (ROCEPHIN ) 2 g in sodium chloride  0.9 % 100 mL IVPB        2 g 200 mL/hr over 30 Minutes Intravenous Every 24 hours 12/06/23 1827 12/11/23 1559   12/07/23 1600  azithromycin  (ZITHROMAX ) tablet 500 mg        500 mg  Oral Daily 12/06/23 1827 12/11/23 1559   12/06/23 1830  cefTRIAXone  (ROCEPHIN ) 1 g in sodium chloride  0.9 % 100 mL IVPB  Status:  Discontinued        1 g 200 mL/hr over 30 Minutes Intravenous  Once 12/06/23 1827 12/08/23 1106   12/06/23  1530  cefTRIAXone  (ROCEPHIN ) 1 g in sodium chloride  0.9 % 100 mL IVPB        1 g 200 mL/hr over 30 Minutes Intravenous  Once 12/06/23 1516 12/06/23 1722   12/06/23 1530  azithromycin  (ZITHROMAX ) 500 mg in sodium chloride  0.9 % 250 mL IVPB        500 mg 250 mL/hr over 60 Minutes Intravenous  Once 12/06/23 1516 12/06/23 1722       Procedures:   Consultants:    Hospital Course by Problem: * Multifocal pneumonia 12/09/23 Continue with Day # 4 Rocephin  today. Discharge with 2 more days of po Duricef 1000 mg bid. Pt told paramedic she was able to wheel her recycling bin from road back to her house yesterday without SOB.  Pt already has her own nebulizer machine. Paramedic verified that it works.  Pt to receive her 4th dose of IV rocephin  and then [facility] to completed po abx and po steroids.  Pt feeling much better. Slight cough but no fever, chills or SOB.  Pt has rehearsal dinner tonight and then grandson's wedding tomorrow.  Advised her to wear a mask and not over do it physically during her celebration as she is still recovering from pneumonia and copd exacerbation. Pt voices understanding of my instructions.   COPD with acute exacerbation (HCC) 12/09/23 Continue with prednisone  40 mg qday to complete 7 days of steroid therapy. Zithromax  500 mg qday for 4 additional days. Duonebs q6h prn and tessalon  perles for cough  Chronic combined systolic and diastolic heart failure (HCC) - LVEF 40 to 45% by echo 06-2022 12/09/23 Not exacerbated. On coreg  and prn lasix . Follows with [facility] cards Dr. Anner. No mention of starting Entresto last office visit 03-2023  CAD (coronary artery disease) 12/09/23 Stable on ASA, lipitor, coreg   CKD stage 3a, GFR 45-59 ml/min (HCC) - baseline Scr 1.2-1.3 12/09/23 Baseline Scr 1.2-1.3.  f/u with PCP to make sure that ACEI/hydrochlorothiazide  is the correct HTN meds for CKD stage 3a   COPD (chronic obstructive pulmonary disease)  (HCC) 12/09/23 Follows with [facility] pulmonology. On Trelegy 100-62.5-25 inhaler, 1 puff daily at home.  From [facility] pulmonology clinic   05/24/2019  11:46 AM  Pulmonary Function Test (PFT)  FVC Pre L 1.83 2.22  FVC_%PRED % 62.6 79 %  FVC_Z-SCORE -1.24  -1.24  FEV1 Pre L 1.11 1.44  FEV1_%PRED % 49.9 66 %  FEV1/FVC Pre % 60.47 64.79  FEV1_Z-SCORE -1.97  -1.97  FEF25-75% Pre L/s 0.54 0.74  FEF25-75%_%PRED % 28 41 %  FEF25-75%_Z-SCORE -1.79  -1.79  TLC Pre L 6.14  TLC_%PRED % 119.5  RV Pre L 4.22  RV_%PRED % 188.6  DLCO Pre ml/(min*mmHg) 14.84  DLCOSINGLEBREATH_%PRED % 83.7   Acquired hypothyroidism 12/09/23 Stable on synthroid   Obesity (BMI 30.0-34.9) 12/09/23 Body mass index is 30.85 kg/m.   Hyperlipidemia with target LDL less than 100 12/09/23 Restart lipitor 40 mg daily after discharge.  Essential hypertension 12/09/23 On coreg . Can restart ACEI/hydrochlorothiazide  after DC. Scr back to normal at 0.92. f/u with PCP to make sure that ACEI/hydrochlorothiazide  is the correct HTN meds for CKD stage 3a    Discharge Diagnoses:  Principal  Problem:   Multifocal pneumonia Active Problems:   COPD with acute exacerbation (HCC)   Essential hypertension   Hyperlipidemia with target LDL less than 100   Obesity (BMI 30.0-34.9)   Acquired hypothyroidism   COPD (chronic obstructive pulmonary disease) (HCC)   CKD stage 3a, GFR 45-59 ml/min (HCC) - baseline Scr 1.2-1.3   CAD (coronary artery disease)   Chronic combined systolic and diastolic heart failure (HCC) - LVEF 40 to 45% by echo 06-2022   Discharge Instructions  Discharge Instructions     (HEART FAILURE PATIENTS) Call MD:  Anytime you have any of the following symptoms: 1) 3 pound weight gain in 24 hours or 5 pounds in 1 week 2) shortness of breath, with or without a dry hacking cough 3) swelling in the hands, feet or stomach 4) if you have to sleep on extra pillows at night in order to breathe.   Complete by: As directed     Call MD for:  difficulty breathing, headache or visual disturbances   Complete by: As directed    Call MD for:  extreme fatigue   Complete by: As directed    Call MD for:  hives   Complete by: As directed    Call MD for:  persistant dizziness or light-headedness   Complete by: As directed    Call MD for:  persistant nausea and vomiting   Complete by: As directed    Call MD for:  redness, tenderness, or signs of infection (pain, swelling, redness, odor or green/yellow discharge around incision site)   Complete by: As directed    Call MD for:  severe uncontrolled pain   Complete by: As directed    Call MD for:  temperature >100.4   Complete by: As directed    Diet - low sodium heart healthy   Complete by: As directed    Discharge instructions   Complete by: As directed    1. Follow up with your primary care provider in 1-2 weeks following discharge from hospital.   Increase activity slowly   Complete by: As directed       Allergies as of 12/09/2023   No Known Allergies      Medication List     TAKE these medications    albuterol  108 (90 Base) MCG/ACT inhaler Commonly known as: VENTOLIN  HFA Inhale 2 puffs into the lungs every 6 (six) hours as needed for wheezing or shortness of breath. What changed: Another medication with the same name was removed. Continue taking this medication, and follow the directions you see here.   allopurinol  300 MG tablet Commonly known as: ZYLOPRIM  TAKE 1 TABLET BY MOUTH DAILY   aspirin  EC 81 MG tablet Take 1 tablet (81 mg total) by mouth daily. Swallow whole.   atorvastatin  40 MG tablet Commonly known as: LIPITOR Take 1 tablet (40 mg total) by mouth daily.   azithromycin  500 MG tablet Commonly known as: ZITHROMAX  Take 1 tablet (500 mg total) by mouth daily for 4 days.   benzonatate  200 MG capsule Commonly known as: TESSALON  Take 1 capsule (200 mg total) by mouth 3 (three) times daily as needed for up to 14 days for cough.    carvedilol  12.5 MG tablet Commonly known as: COREG  Take 1.5 tablets (18.75 mg total) by mouth 2 (two) times daily.   cefadroxil  500 MG capsule Commonly known as: DURICEF Take 2 capsules (1,000 mg total) by mouth 2 (two) times daily for 2 days.   furosemide  20 MG tablet  Commonly known as: LASIX  Take 1 tablet (20 mg total) by mouth daily as needed. What changed: reasons to take this   gabapentin  100 MG capsule Commonly known as: NEURONTIN  TAKE 2 CAPSULES BY MOUTH IN THE  MORNING AND 3 CAPSULES BY MOUTH  AT BEDTIME What changed: See the new instructions.   guaiFENesin  600 MG 12 hr tablet Commonly known as: MUCINEX  Take 1 tablet (600 mg total) by mouth 2 (two) times daily.   ipratropium-albuterol  0.5-2.5 (3) MG/3ML Soln Commonly known as: DUONEB Inhale 3 mLs by nebulization every 6 (six) hours as needed (SOB, wheezing).   levothyroxine  50 MCG tablet Commonly known as: SYNTHROID  Take 1 tablet by mouth once daily What changed: when to take this   lisinopril -hydrochlorothiazide  20-25 MG tablet Commonly known as: ZESTORETIC  Take 1 tablet by mouth daily.   omeprazole  20 MG capsule Commonly known as: PRILOSEC Take 1 capsule (20 mg total) by mouth daily. TAKE 1 CAPSULE BY MOUTH  TWICE DAILY BEFORE A MEAL What changed:  when to take this additional instructions   predniSONE  20 MG tablet Commonly known as: DELTASONE  Take 2 tablets (40 mg total) by mouth daily with breakfast for 4 days. Start taking on: December 10, 2023   Premarin  vaginal cream Generic drug: conjugated estrogens  Place 1 Applicatorful vaginally 2 (two) times a week.   Rybelsus  7 MG Tabs Generic drug: Semaglutide  Take 1 tablet by mouth once daily   Trelegy Ellipta  100-62.5-25 MCG/ACT Aepb Generic drug: Fluticasone -Umeclidin-Vilant Inhale 1 puff into the lungs daily. Plans to start bevespi  after completing rx for trelegy What changed: additional instructions   TYLENOL  500 MG tablet Generic drug:  acetaminophen  Take 500-1,000 mg by mouth every 6 (six) hours as needed for mild pain (pain score 1-3) or headache. What changed: Another medication with the same name was removed. Continue taking this medication, and follow the directions you see here.               Durable Medical Equipment  (From admission, onward)           Start     Ordered   12/08/23 1611  For home use only DME Nebulizer machine  Once       Question Answer Comment  Patient needs a nebulizer to treat with the following condition COPD (chronic obstructive pulmonary disease) (HCC)   Length of Need Lifetime   Additional equipment included Administration kit      12/08/23 1611            No Known Allergies  Discharge Exam: Vitals:   12/08/23 0742 12/08/23 1500  BP:  138/89  Pulse:  94  Resp:  18  Temp:  (!) 97.5 F (36.4 C)  SpO2: 95% 92%  Physical exam by paramedic. Documented in this note for clarity.   Physical Exam Vitals and nursing note reviewed.  Constitutional:      General: She is not in acute distress.    Appearance: She is obese. She is not toxic-appearing.  HENT:     Head: Normocephalic and atraumatic.  Eyes:     General: No scleral icterus. Cardiovascular:     Rate and Rhythm: Normal rate.  Pulmonary:     Effort: Pulmonary effort is normal.     Breath sounds: Normal breath sounds.     Comments: Pt seen receiving a nebulizer treatment Abdominal:     General: Bowel sounds are normal.     Palpations: Abdomen is soft.  Musculoskeletal:     Right  lower leg: No edema.     Left lower leg: No edema.  Skin:    General: Skin is warm and dry.     Capillary Refill: Capillary refill takes less than 2 seconds.  Neurological:     Mental Status: She is alert and oriented to person, place, and time.    The results of significant diagnostics from this hospitalization (including imaging, microbiology, ancillary and laboratory) are listed below for reference.     Microbiology: Recent Results (from the past 240 hours)  Blood culture (routine x 2)     Status: None (Preliminary result)   Collection Time: 12/06/23  3:20 PM   Specimen: BLOOD  Result Value Ref Range Status   Specimen Description   Final    BLOOD LEFT ANTECUBITAL Performed at [facility], [address], [place], [place] [address]    Special Requests   Final    BOTTLES DRAWN AEROBIC AND ANAEROBIC Blood Culture adequate volume Performed at [facility], [address] [place], [place] [address]    Culture   Final    NO GROWTH 3 DAYS Performed at [facility], [address], [place], [place] [address]    Report Status PENDING  Incomplete  Resp panel by RT-PCR (RSV, Flu A&B, Covid) Anterior Nasal Swab     Status: None   Collection Time: 12/06/23  3:35 PM   Specimen: Anterior Nasal Swab  Result Value Ref Range Status   SARS Coronavirus 2 by RT PCR NEGATIVE NEGATIVE Final    Comment: (NOTE) SARS-CoV-2 target nucleic acids are NOT DETECTED.  The SARS-CoV-2 RNA is generally detectable in upper respiratory specimens during the acute phase of infection. The lowest concentration of SARS-CoV-2 viral copies this assay can detect is 138 copies/mL. A negative result does not preclude SARS-Cov-2 infection and should not be used as the sole basis for treatment or other patient management decisions. A negative result may occur with  improper specimen collection/handling, submission of specimen other than nasopharyngeal swab, presence of viral mutation(s) within the areas targeted by this assay, and inadequate number of viral copies(<138 copies/mL). A negative result must be combined with clinical observations, patient history, and epidemiological information. The expected result is Negative.  Fact Sheet for Patients:  BloggerCourse.com  Fact Sheet for Healthcare Providers:   SeriousBroker.it  This test is no t yet approved or cleared by the [place]  FDA and  has been authorized for detection and/or diagnosis of SARS-CoV-2 by FDA under an Emergency Use Authorization (EUA). This EUA will remain  in effect (meaning this test can be used) for the duration of the COVID-19 declaration under Section 564(b)(1) of the Act, 21 U.S.C.section 360bbb-3(b)(1), unless the authorization is terminated  or revoked sooner.       Influenza A by PCR NEGATIVE NEGATIVE Final   Influenza B by PCR NEGATIVE NEGATIVE Final    Comment: (NOTE) The Xpert Xpress SARS-CoV-2/FLU/RSV plus assay is intended as an aid in the diagnosis of influenza from Nasopharyngeal swab specimens and should not be used as a sole basis for treatment. Nasal washings and aspirates are unacceptable for Xpert Xpress SARS-CoV-2/FLU/RSV testing.  Fact Sheet for Patients: BloggerCourse.com  Fact Sheet for Healthcare Providers: SeriousBroker.it  This test is not yet approved or cleared by the [place]  FDA and has been authorized for detection and/or diagnosis of SARS-CoV-2 by FDA under an Emergency Use Authorization (EUA). This EUA will remain in effect (meaning this test can be used) for  the duration of the COVID-19 declaration under Section 564(b)(1) of the Act, 21 U.S.C. section 360bbb-3(b)(1), unless the authorization is terminated or revoked.     Resp Syncytial Virus by PCR NEGATIVE NEGATIVE Final    Comment: (NOTE) Fact Sheet for Patients: BloggerCourse.com  Fact Sheet for Healthcare Providers: SeriousBroker.it  This test is not yet approved or cleared by the [place]  FDA and has been authorized for detection and/or diagnosis of SARS-CoV-2 by FDA under an Emergency Use Authorization (EUA). This EUA will remain in effect (meaning this test can be used) for  the duration of the COVID-19 declaration under Section 564(b)(1) of the Act, 21 U.S.C. section 360bbb-3(b)(1), unless the authorization is terminated or revoked.  Performed at [facility], [address], [place], [place] [address]   Blood culture (routine x 2)     Status: None (Preliminary result)   Collection Time: 12/06/23  3:35 PM   Specimen: BLOOD  Result Value Ref Range Status   Specimen Description   Final    BLOOD RIGHT ANTECUBITAL Performed at [facility], [address], [place], [place] [address]    Special Requests   Final    BOTTLES DRAWN AEROBIC AND ANAEROBIC Blood Culture adequate volume Performed at [facility], [address], [place], [place] [address]    Culture   Final    NO GROWTH 3 DAYS Performed at [facility], [address] [place], [place] [address]    Report Status PENDING  Incomplete     Labs:  Basic Metabolic Panel: Recent Labs  Lab 12/06/23 1417 12/07/23 0526  NA 133* 135  K 3.9 3.7  CL 96* 101  CO2 21* 20*  GLUCOSE 103* 244*  BUN 31* 27*  CREATININE 1.16* 0.92  CALCIUM  8.9 8.0*   CBC: Recent Labs  Lab 12/06/23 1417 12/07/23 0526  WBC 14.5* 9.6  NEUTROABS 12.4*  --   HGB 11.7* 10.5*  HCT 35.7* 31.2*  MCV 98.9 97.2  PLT 210 191   CBG: Recent Labs  Lab 12/07/23 0734 12/07/23 1123 12/07/23 1652 12/07/23 2106 12/08/23 0723  GLUCAP 181* 172* 187* 162* 134*   Sepsis Labs Recent Labs  Lab 12/06/23 1417 12/07/23 0526  PROCALCITON 1.74  --   WBC 14.5* 9.6    Procedures/Studies: DG Chest 2 View Result Date: 12/06/2023 CLINICAL DATA:  Cough. EXAM: DG CHEST 2V COMPARISON:  Chest x-ray 06/27/2022 FINDINGS: There some faint patchy opacities in the bilateral lower lungs, right greater than left. The left lower lung opacity is slightly nodular. There is a small left pleural effusion. There is no pneumothorax. No acute fractures are seen. There surgical clips in  the upper abdomen. IMPRESSION: 1. Faint patchy opacities in the bilateral lower lungs, right greater than left. The left lower lung opacity is slightly nodular. Findings are concerning for infection. Follow-up chest x-ray recommended in 4-6 weeks to confirm resolution. 2. Small left pleural effusion. Electronically Signed   By: Greig Pique M.D.   On: 12/06/2023 15:11    Time coordinating discharge: 60 mins  SIGNED:  Camellia Door, DO [facility] 12/09/23, 10:05 AM

## 2023-12-09 NOTE — Progress Notes (Signed)
 Hospital at Home Progress Note    Paysley Combs  FMW:[id number] DOB: January 23, 1946 DOA: 12/06/2023 PCP: Geofm Glade PARAS, MD  Subjective: Pt seen during video visit. Physical exam by paramedic. Documented in this note for clarity.  Pt told paramedic she was able to wheel her recycling bin from road back to her house yesterday without SOB.  Pt already has her own nebulizer machine. Paramedic verified that it works.  Pt to receive her 4th dose of IV rocephin  and then DC from [facility] program to completed po abx and po steroids.  Pt feeling much better. Slight cough but no fever, chills or SOB.  Pt has rehearsal dinner tonight and then grandson's wedding tomorrow.  Advised her to wear a mask and not over do it physically during her celebration as she is still recovering from pneumonia and copd exacerbation. Pt voices understanding of my instructions.    Provider Location: [place], [place] Patient examined by: vickie(paramedic) Patient identified as April Combs and Date of Birth of [birth year].  Hospital Course: CC: cough, headache, fever HPI: April Combs is a 78 y.o. female with medical history significant of with history of COPD,prior tobacco use, hypertension, hyperlipidemia, coronary artery disease, HFmrEF, hypothyroidism who presented to the Emergency Department from home with complaint of shortness of breath.  Symptoms started about a week ago, worsened today.  Report of feeling lightheaded and weakness at home. She became dyspnea on minimal exertion.  Also reported sore throat, cough, chills,wheezing, dizziness .  She also reported a fever of 102 Fahrenheit last weekend.  She was bringing up phlegm which was greenish/brownish in color. Due to worsening of her symptoms, patient was seen at urgent care earlier this morning and was found to be tachypneic and hypoxic.  She was then recommended to go to the emergency department. On presentation, she was in sinus tachycardia  with a stable blood pressure.  She appeared short of breath.  She was wheezing. Patient seen and examined at bedside in the emergency department.  During my evaluation, she was overall comfortable, lying in bed.  EKG monitor showed sinus tachycardia with a stable blood pressure.  She was on room air , speaking in full sentences.  She says she feels better after she was given breathing treatment and antibiotics. Patient denies any chest pain, abdomen pain, dysuria, chills, vomiting, diarrhea, loss of consciousness. She lives alone and ambulates without any problem.     ED Course: Lab work showed leukocytosis in the range of 14k , creatinine of 1.1.  Also appeared dehydrated and was given IV fluid.  She was maintaining her saturation on room air.  Chest x-ray showed faint patchy opacities in the bilateral lower lungs, right greater than left.  Patient admitted for the management of multifocal pneumonia.  Started on broad spectrum antibiotics.  Blood culture sent.  COVID/flu/RSV negative. She was treated with a dose of azithromycin , ceftriaxone  and Solu-Medrol .  Patient was requested for admission of multifocal pneumonia.  Significant Events: Admitted 12/06/2023 for multifocal pneumonia 12-08-2023 transferred to [facility] program to complete IV abx at home.  Admission Labs: WBC 14.5, HgB 11.7, plt 210 Na 133, K 3.9, CO2 of 21, BUN 31, Scr 1.16, glu 103 Covid/RSV/flu negative Lactic acid 1.2 Procalcitonin 1.74 Strep Pneumo antigen negative  Admission Imaging Studies: CXR Faint patchy opacities in the bilateral lower lungs, right greater than left. The left lower lung opacity is slightly nodular. Findings are concerning for infection. Follow-up chest x-ray recommended in 4-6 weeks  to confirm resolution. 2. Small left pleural effusion.  Significant Labs:   Significant Imaging Studies:   Antibiotic Therapy: Anti-infectives (From admission, onward)    Start     Dose/Rate Route  Frequency Ordered Stop   12/07/23 1600  cefTRIAXone  (ROCEPHIN ) 2 g in sodium chloride  0.9 % 100 mL IVPB        2 g 200 mL/hr over 30 Minutes Intravenous Every 24 hours 12/06/23 1827 12/11/23 1559   12/07/23 1600  azithromycin  (ZITHROMAX ) tablet 500 mg        500 mg Oral Daily 12/06/23 1827 12/11/23 1559   12/06/23 1830  cefTRIAXone  (ROCEPHIN ) 1 g in sodium chloride  0.9 % 100 mL IVPB  Status:  Discontinued        1 g 200 mL/hr over 30 Minutes Intravenous  Once 12/06/23 1827 12/08/23 1106   12/06/23 1530  cefTRIAXone  (ROCEPHIN ) 1 g in sodium chloride  0.9 % 100 mL IVPB        1 g 200 mL/hr over 30 Minutes Intravenous  Once 12/06/23 1516 12/06/23 1722   12/06/23 1530  azithromycin  (ZITHROMAX ) 500 mg in sodium chloride  0.9 % 250 mL IVPB        500 mg 250 mL/hr over 60 Minutes Intravenous  Once 12/06/23 1516 12/06/23 1722       Procedures:   Consultants:     Assessment and Plan: * Multifocal pneumonia 12/09/23 Continue with Day # 4 Rocephin  today. Discharge with 2 more days of po Duricef 1000 mg bid. Pt told paramedic she was able to wheel her recycling bin from road back to her house yesterday without SOB.  Pt already has her own nebulizer machine. Paramedic verified that it works.  Pt to receive her 4th dose of IV rocephin  and then DC from [facility] program to completed po abx and po steroids.  Pt feeling much better. Slight cough but no fever, chills or SOB.  Pt has rehearsal dinner tonight and then grandson's wedding tomorrow.  Advised her to wear a mask and not over do it physically during her celebration as she is still recovering from pneumonia and copd exacerbation. Pt voices understanding of my instructions.   COPD with acute exacerbation (HCC) 12/09/23 Continue with prednisone  40 mg qday to complete 7 days of steroid therapy. Zithromax  500 mg qday for 4 additional days. Duonebs q6h prn and tessalon  perles for cough  Chronic combined systolic and diastolic heart  failure (HCC) - LVEF 40 to 45% by echo 06-2022 12/09/23 Not exacerbated. On coreg  and prn lasix . Follows with [facility] cards Dr. Anner. No mention of starting Entresto last office visit 03-2023  CAD (coronary artery disease) 12/09/23 Stable on ASA, lipitor, coreg   CKD stage 3a, GFR 45-59 ml/min (HCC) - baseline Scr 1.2-1.3 12/09/23 Baseline Scr 1.2-1.3.  f/u with PCP to make sure that ACEI/hydrochlorothiazide  is the correct HTN meds for CKD stage 3a   COPD (chronic obstructive pulmonary disease) (HCC) 12/09/23 Follows with [facility] pulmonology. On Trelegy 100-62.5-25 inhaler, 1 puff daily at home.  From [facility]   05/24/2019  11:46 AM  Pulmonary Function Test (PFT)  FVC Pre L 1.83 2.22  FVC_%PRED % 62.6 79 %  FVC_Z-SCORE -1.24  -1.24  FEV1 Pre L 1.11 1.44  FEV1_%PRED % 49.9 66 %  FEV1/FVC Pre % 60.47 64.79  FEV1_Z-SCORE -1.97  -1.97  FEF25-75% Pre L/s 0.54 0.74  FEF25-75%_%PRED % 28 41 %  FEF25-75%_Z-SCORE -1.79  -1.79  TLC Pre L 6.14  TLC_%PRED % 119.5  RV Pre L 4.22  RV_%PRED % 188.6  DLCO Pre ml/(min*mmHg) 14.84  DLCOSINGLEBREATH_%PRED % 83.7   Acquired hypothyroidism 12/09/23 Stable on synthroid   Obesity (BMI 30.0-34.9) 12/09/23 Body mass index is 30.85 kg/m.   Hyperlipidemia with target LDL less than 100 12/09/23 Restart lipitor 40 mg daily after discharge.  Essential hypertension 12/09/23 On coreg . Can restart ACEI/hydrochlorothiazide  after DC. Scr back to normal at 0.92. f/u with PCP to make sure that ACEI/hydrochlorothiazide  is the correct HTN meds for CKD stage 3a  DVT prophylaxis: Place TED hose Start: 12/08/23 1518    Code Status: Full Code Family Communication: pt is decisional. No family available during video call Disposition Plan: home Reason for continuing need for hospitalization: stable for DC  Objective: Vitals:   12/07/23 2004 12/08/23 0550 12/08/23 0742 12/08/23 1500  BP: (!) 112/55 (!) 125/93  138/89  Pulse: (!) 106 72  94   Resp: 18 19  18   Temp: 98.2 F (36.8 C) 97.8 F (36.6 C)  (!) 97.5 F (36.4 C)  TempSrc: Oral Oral  Oral  SpO2: 95% 94% 95% 92%  Weight:    84.1 kg  Height:        Intake/Output Summary (Last 24 hours) at 12/09/2023 1003 Last data filed at 12/08/2023 1615 Gross per 24 hour  Intake 100 ml  Output --  Net 100 ml   Filed Weights   12/06/23 1404 12/08/23 1500  Weight: 88.5 kg 84.1 kg    Examination: Note that physical examination performed by paramedic on-scene and documented by provider Video Exam performed by video enabled technology  Physical Exam Vitals and nursing note reviewed.  Constitutional:      General: She is not in acute distress.    Appearance: She is obese. She is not toxic-appearing.  HENT:     Head: Normocephalic and atraumatic.  Eyes:     General: No scleral icterus. Cardiovascular:     Rate and Rhythm: Normal rate.  Pulmonary:     Effort: Pulmonary effort is normal.     Breath sounds: Normal breath sounds.     Comments: Pt seen receiving a nebulizer treatment Abdominal:     General: Bowel sounds are normal.     Palpations: Abdomen is soft.  Musculoskeletal:     Right lower leg: No edema.     Left lower leg: No edema.  Skin:    General: Skin is warm and dry.     Capillary Refill: Capillary refill takes less than 2 seconds.  Neurological:     Mental Status: She is alert and oriented to person, place, and time.     Data Reviewed: I have personally reviewed following labs and imaging studies  CBC: Recent Labs  Lab 12/06/23 1417 12/07/23 0526  WBC 14.5* 9.6  NEUTROABS 12.4*  --   HGB 11.7* 10.5*  HCT 35.7* 31.2*  MCV 98.9 97.2  PLT 210 191   Basic Metabolic Panel: Recent Labs  Lab 12/06/23 1417 12/07/23 0526  NA 133* 135  K 3.9 3.7  CL 96* 101  CO2 21* 20*  GLUCOSE 103* 244*  BUN 31* 27*  CREATININE 1.16* 0.92  CALCIUM  8.9 8.0*   GFR: Estimated Creatinine Clearance: 54.8 mL/min (by C-G formula based on SCr of 0.92  mg/dL). CBG: Recent Labs  Lab 12/07/23 0734 12/07/23 1123 12/07/23 1652 12/07/23 2106 12/08/23 0723  GLUCAP 181* 172* 187* 162* 134*   Sepsis Labs: Recent Labs  Lab 12/06/23 1417 12/06/23 1535  PROCALCITON 1.74  --  LATICACIDVEN  --  1.2    Recent Results (from the past 240 hours)  Blood culture (routine x 2)     Status: None (Preliminary result)   Collection Time: 12/06/23  3:20 PM   Specimen: BLOOD  Result Value Ref Range Status   Specimen Description   Final    BLOOD LEFT ANTECUBITAL Performed at [facility], [address] [place], [place] [address]    Special Requests   Final    BOTTLES DRAWN AEROBIC AND ANAEROBIC Blood Culture adequate volume Performed at [facility], [address], [place], [place] [address]    Culture   Final    NO GROWTH 3 DAYS Performed at [facility], [address] [place], [place] [address]    Report Status PENDING  Incomplete  Resp panel by RT-PCR (RSV, Flu A&B, Covid) Anterior Nasal Swab     Status: None   Collection Time: 12/06/23  3:35 PM   Specimen: Anterior Nasal Swab  Result Value Ref Range Status   SARS Coronavirus 2 by RT PCR NEGATIVE NEGATIVE Final    Comment: (NOTE) SARS-CoV-2 target nucleic acids are NOT DETECTED.  The SARS-CoV-2 RNA is generally detectable in upper respiratory specimens during the acute phase of infection. The lowest concentration of SARS-CoV-2 viral copies this assay can detect is 138 copies/mL. A negative result does not preclude SARS-Cov-2 infection and should not be used as the sole basis for treatment or other patient management decisions. A negative result may occur with  improper specimen collection/handling, submission of specimen other than nasopharyngeal swab, presence of viral mutation(s) within the areas targeted by this assay, and inadequate number of viral copies(<138 copies/mL). A negative result must be combined with clinical  observations, patient history, and epidemiological information. The expected result is Negative.  Fact Sheet for Patients:  BloggerCourse.com  Fact Sheet for Healthcare Providers:  SeriousBroker.it  This test is no t yet approved or cleared by the [place]  FDA and  has been authorized for detection and/or diagnosis of SARS-CoV-2 by FDA under an Emergency Use Authorization (EUA). This EUA will remain  in effect (meaning this test can be used) for the duration of the COVID-19 declaration under Section 564(b)(1) of the Act, 21 U.S.C.section 360bbb-3(b)(1), unless the authorization is terminated  or revoked sooner.       Influenza A by PCR NEGATIVE NEGATIVE Final   Influenza B by PCR NEGATIVE NEGATIVE Final    Comment: (NOTE) The Xpert Xpress SARS-CoV-2/FLU/RSV plus assay is intended as an aid in the diagnosis of influenza from Nasopharyngeal swab specimens and should not be used as a sole basis for treatment. Nasal washings and aspirates are unacceptable for Xpert Xpress SARS-CoV-2/FLU/RSV testing.  Fact Sheet for Patients: BloggerCourse.com  Fact Sheet for Healthcare Providers: SeriousBroker.it  This test is not yet approved or cleared by the [place]  FDA and has been authorized for detection and/or diagnosis of SARS-CoV-2 by FDA under an Emergency Use Authorization (EUA). This EUA will remain in effect (meaning this test can be used) for the duration of the COVID-19 declaration under Section 564(b)(1) of the Act, 21 U.S.C. section 360bbb-3(b)(1), unless the authorization is terminated or revoked.     Resp Syncytial Virus by PCR NEGATIVE NEGATIVE Final    Comment: (NOTE) Fact Sheet for Patients: BloggerCourse.com  Fact Sheet for Healthcare Providers: SeriousBroker.it  This test is not yet approved or cleared by  the [place]  FDA and has been authorized for detection and/or  diagnosis of SARS-CoV-2 by FDA under an Emergency Use Authorization (EUA). This EUA will remain in effect (meaning this test can be used) for the duration of the COVID-19 declaration under Section 564(b)(1) of the Act, 21 U.S.C. section 360bbb-3(b)(1), unless the authorization is terminated or revoked.  Performed at [facility], [address] [place], [place] [address]   Blood culture (routine x 2)     Status: None (Preliminary result)   Collection Time: 12/06/23  3:35 PM   Specimen: BLOOD  Result Value Ref Range Status   Specimen Description   Final    BLOOD RIGHT ANTECUBITAL Performed at [facility], [address] [place], [place] [address]    Special Requests   Final    BOTTLES DRAWN AEROBIC AND ANAEROBIC Blood Culture adequate volume Performed at [facility], [address], [place], [place] [address]    Culture   Final    NO GROWTH 3 DAYS Performed at [facility], [address], [place], [place] [address]    Report Status PENDING  Incomplete    Scheduled Meds:  albuterol   2.5 mg Nebulization BID   allopurinol   300 mg Oral Daily   aspirin  EC  81 mg Oral Daily   atorvastatin   40 mg Oral Daily   azithromycin   500 mg Oral Daily   budesonide -glycopyrrolate -formoterol   2 puff Inhalation BID   carvedilol   3.125 mg Oral BID WC   gabapentin   200 mg Oral BID   guaiFENesin   600 mg Oral BID   levothyroxine   50 mcg Oral Daily   pantoprazole   40 mg Oral Daily   predniSONE   40 mg Oral Q breakfast   Continuous Infusions:  cefTRIAXone  (ROCEPHIN )  IV 2 g (12/09/23 0935)     LOS: 3 days   Time spent: 55 minutes  Camellia Door, DO  [facility]  12/09/2023, 10:03 AM

## 2023-12-09 NOTE — Subjective & Objective (Signed)
 Pt seen during video visit. Physical exam by paramedic. Documented in this note for clarity.  Pt told paramedic she was able to wheel her recycling bin from road back to her house yesterday without SOB.  Pt already has her own nebulizer machine. Paramedic verified that it works.  Pt to receive her 4th dose of IV rocephin  and then DC from [facility] program to completed po abx and po steroids.  Pt feeling much better. Slight cough but no fever, chills or SOB.  Pt has rehearsal dinner tonight and then grandson's wedding tomorrow.  Advised her to wear a mask and not over do it physically during her celebration as she is still recovering from pneumonia and copd exacerbation. Pt voices understanding of my instructions.

## 2023-12-09 NOTE — TOC Transition Note (Signed)
 Transition of Care [facility]) - Discharge Note   Patient Details  Name: April Combs MRN: [id number] Date of Birth: 08-03-1945  [facility] CM/SW Contact:  Corean JAYSON Canary, RN Phone Number: 12/09/2023, 11:07 AM   Clinical Narrative:    Patient will discharge from hospital at home program, no needs, no barriers to discharge      Final next level of care: Home/Self Care Barriers to Discharge: No Barriers Identified   Patient Goals and CMS Choice Patient states their goals for this hospitalization and ongoing recovery are:: Home CMS Medicare.gov Compare Post Acute Care list provided to:: Patient Represenative (must comment) (Angela(dtr)) Choice offered to / list presented to : Adult Children [place] ownership interest in [facility].provided to:: Adult Children    Discharge Placement                       Discharge Plan and Services Additional resources added to the After Visit Summary for       Post Acute Care Choice: Home Health                               Social Drivers of Health (SDOH) Interventions SDOH Screenings   Food Insecurity: No Food Insecurity (12/08/2023)  Recent Concern: Food Insecurity - Food Insecurity Present (12/06/2023)  Housing: Low Risk  (12/08/2023)  Transportation Needs: No Transportation Needs (12/08/2023)  Utilities: Not At Risk (12/08/2023)  Alcohol Screen: Low Risk  (05/27/2023)  Depression (PHQ2-9): Low Risk  (02/24/2023)  Financial Resource Strain: Low Risk  (05/27/2023)  Physical Activity: Inactive (05/27/2023)  Social Connections: Moderately Isolated (12/08/2023)  Stress: No Stress Concern Present (05/27/2023)  Tobacco Use: Medium Risk (12/06/2023)  Health Literacy: Adequate Health Literacy (12/16/2022)     Readmission Risk Interventions     No data to display

## 2023-12-09 NOTE — Progress Notes (Signed)
 Arrived to find the PT at the front door allowing staff inside. PT is alert and oriented to her baseline and in no obvious distress. PT walks with a normal, steady gait. She stated that she slept well and that she was feeling much better. PT stated that she was able to walk and get her recycling can without having to stop to breathe or being fatigued.   PT's vitals were obtained and documented. IV abx were administered. Physical exam showed negative DCAPBTLS to the head, neck, or face. PERRLA. Negative JVD or tracheal deviation. Chest expansion is equal with non-labored breathing. PT has a non-productive cough that occasionally happens. ABD is soft, non-tender.   PT had a virtual visit with Dr. Laurence who was d/c her. PT agreed with the d/c.   TOC medications were brought to the PT and given to her. RN Jonne went over the AVS with PT. All questions were answered.   All hospital at home equipment was removed from the home.

## 2023-12-09 NOTE — Progress Notes (Signed)
 Discharge instructions reviewed with patient after virtual visit with Dr. Laurence and RN. Paramedic and EMT at patient's side. Pt voiced understanding.

## 2023-12-11 LAB — CULTURE, BLOOD (ROUTINE X 2)
Culture: NO GROWTH
Culture: NO GROWTH
Special Requests: ADEQUATE
Special Requests: ADEQUATE

## 2023-12-12 ENCOUNTER — Telehealth: Payer: Self-pay | Admitting: *Deleted

## 2023-12-12 NOTE — Transitions of Care (Post Inpatient/ED Visit) (Signed)
 12/12/2023  Name: April Combs MRN: [id number] DOB: 06-Jul-1945  Today's TOC FU Call Status: Today's TOC FU Call Status:: Successful TOC FU Call Completed TOC FU Call Complete Date: 12/12/23 Patient's Name and Date of Birth confirmed.  Transition Care Management Follow-up Telephone Call Date of Discharge: 12/09/23 Discharge Facility: [facility]) [facility] at Home program) Type of Discharge: Inpatient Admission Primary Inpatient Discharge Diagnosis:: Multifocal pneumonia How have you been since you were released from the hospital?: Better (I am fine now and breathing with no problems.  They signed off at the hospital at home program because I am doing so well; I don't need all these phone calls because I am better now and completely independent; have finished all the antibiotics) Any questions or concerns?: No  Items Reviewed: Did you receive and understand the discharge instructions provided?: No (thoroughly reviewed with patient who verbalizes good understanding of same) Medications obtained,verified, and reconciled?: Yes (Medications Reviewed) (Full medication reconciliation/ review completed; no concerns or discrepancies identified; confirmed patient obtained/ is taking all newly Rx'd medications as instructed; self-manages medications and denies questions/ concerns around medications today) Any new allergies since your discharge?: No Dietary orders reviewed?: Yes Type of Diet Ordered:: Regular diet: confirms does not retrict salt intake: provided education around importance and rationale for following heart health/ low salt diet in setting of CHF: she states yes, I know all of that, but I still eat pretty much whatever I want to Do you have support at home?: Yes People in Home [RPT]: alone Name of Support/Comfort Primary Source: Reports independent in self-care activities; resides alone: has multiple local family members-- assists as/ if needed/ indicated  Medications  Reviewed Today: Medications Reviewed Today     Reviewed by Muskan Bolla M, RN (Registered Nurse) on 12/12/23 at 1144  Med List Status: <None>   Medication Order Taking? Sig Documenting Provider Last Dose Status Informant  albuterol  (VENTOLIN  HFA) 108 (90 Base) MCG/ACT inhaler [id number] Yes Inhale 2 puffs into the lungs every 6 (six) hours as needed for wheezing or shortness of breath. Geofm Glade PARAS, MD  Active Self  allopurinol  (ZYLOPRIM ) 300 MG tablet [id number] Yes TAKE 1 TABLET BY MOUTH DAILY Burns, Glade PARAS, MD  Active Self  aspirin  EC 81 MG tablet [id number]  Take 1 tablet (81 mg total) by mouth daily. Swallow whole.  Patient not taking: Reported on 12/12/2023   Akula, Vijaya, MD  Active Self  atorvastatin  (LIPITOR) 40 MG tablet [id number] Yes Take 1 tablet (40 mg total) by mouth daily. Anner Alm ORN, MD  Active Self  azithromycin  (ZITHROMAX ) 500 MG tablet [id number]  Take 1 tablet (500 mg total) by mouth daily for 4 days.  Patient not taking: Reported on 12/12/2023   Laurence Locus, DO  Active   benzonatate  (TESSALON ) 200 MG capsule [id number] Yes Take 1 capsule (200 mg total) by mouth 3 (three) times daily as needed for up to 14 days for cough. Laurence Locus, DO  Active   carvedilol  (COREG ) 12.5 MG tablet [id number] Yes Take 1.5 tablets (18.75 mg total) by mouth 2 (two) times daily. Anner Alm ORN, MD  Active Self  conjugated estrogens  (PREMARIN ) vaginal cream [id number]  Place 1 Applicatorful vaginally 2 (two) times a week.  Patient not taking: Reported on 12/12/2023   Geofm Glade PARAS, MD  Active Self  Fluticasone -Umeclidin-Vilant (TRELEGY ELLIPTA ) 100-62.5-25 MCG/ACT AEPB [id number] Yes Inhale 1 puff into the lungs daily. Plans to start bevespi  after completing rx for trelegy Geofm Glade PARAS, MD  Active Self  furosemide  (LASIX ) 20 MG tablet [id number] Yes Take 1 tablet (20 mg total) by mouth daily as needed. Anner Alm ORN, MD  Active Self  gabapentin  (NEURONTIN ) 100 MG capsule [id number] Yes TAKE 2  CAPSULES BY MOUTH IN THE  MORNING AND 3 CAPSULES BY MOUTH  AT BEDTIME Geofm Glade PARAS, MD  Active Self  guaiFENesin  (MUCINEX ) 600 MG 12 hr tablet [id number] Yes Take 1 tablet (600 mg total) by mouth 2 (two) times daily. Akula, Vijaya, MD  Active Self  ipratropium-albuterol  (DUONEB) 0.5-2.5 (3) MG/3ML SOLN [id number] Yes Inhale 3 mLs by nebulization every 6 (six) hours as needed (SOB, wheezing). Laurence Locus, DO  Active   levothyroxine  (SYNTHROID ) 50 MCG tablet [id number] Yes Take 1 tablet by mouth once daily Burns, Glade PARAS, MD  Active Self  lisinopril -hydrochlorothiazide  (ZESTORETIC ) 20-25 MG tablet [id number] Yes Take 1 tablet by mouth daily. Anner Alm ORN, MD  Active Self  omeprazole  (PRILOSEC) 20 MG capsule [id number] Yes Take 1 capsule (20 mg total) by mouth daily. TAKE 1 CAPSULE BY MOUTH  TWICE DAILY BEFORE A MEAL Burns, Glade PARAS, MD  Active Self  predniSONE  (DELTASONE ) 20 MG tablet [id number] Yes Take 2 tablets (40 mg total) by mouth daily with breakfast for 4 days. Laurence Locus, DO  Active   RYBELSUS  7 MG TABS [id number] Yes Take 1 tablet by mouth once daily Geofm Glade PARAS, MD  Active Self  TYLENOL  500 MG tablet [id number] Yes Take 500-1,000 mg by mouth every 6 (six) hours as needed for mild pain (pain score 1-3) or headache. [provider]  Active Self           Home Care and Equipment/Supplies: Were Home Health Services Ordered?: No Any new equipment or medical supplies ordered?: No  Functional Questionnaire: Do you need assistance with bathing/showering or dressing?: No Do you need assistance with meal preparation?: No Do you need assistance with eating?: No Do you have difficulty maintaining continence: No Do you need assistance with getting out of bed/getting out of a chair/moving?: No Do you have difficulty managing or taking your medications?: No  Follow up appointments reviewed: PCP Follow-up appointment confirmed?: Yes Date of PCP follow-up appointment?:  12/20/23 Follow-up Provider: PCP- Dr. Geofm Specialist [facility] Follow-up appointment confirmed?: No Reason Specialist Follow-Up Not Confirmed: Patient has Specialist Provider Number and will Call for Appointment ([facility] pulmonary provider) Do you need transportation to your follow-up appointment?: No Do you understand care options if your condition(s) worsen?: Yes-patient verbalized understanding  SDOH Interventions Today    Flowsheet Row Most Recent Value  SDOH Interventions   Food Insecurity Interventions Intervention Not Indicated  Housing Interventions Intervention Not Indicated  Transportation Interventions Intervention Not Indicated  [reports drives self as per baseline]  Utilities Interventions Intervention Not Indicated   See TOC assessment tabs for additional assessment/ TOC intervention information  TOC interventions: provided education / rationale for daily weight monitoring at home as earliest indicator of fluid retention, along with weight gain guidelines/ action plan for weight gain; importance of taking diuretic as prescribed signs and symptoms other than weight gain that indicate fluid retention Reinforced/ provided education around basics of following heart healthy low salt diet  Patient declines need for ongoing/ further care management/ coordination outreach; declines enrollment in 30-day TOC program- declines taking my direct phone number should needs/ concerns arise post-TOC call   Pls call/ message for questions,  Wrigley Plasencia Mckinney Devone Tousley, RN, BSN, Media planner  Transitions of Care  [facility] -  Population Health  [place] [phone]: direct office

## 2023-12-19 ENCOUNTER — Encounter: Payer: Self-pay | Admitting: Internal Medicine

## 2023-12-19 NOTE — Progress Notes (Unsigned)
 Subjective:    Patient ID: April Combs, female    DOB: 1945/06/14, 78 y.o.   MRN: [id number]     HPI April Combs is here for follow up from the hospital  9/23--9/26  Cc: cough, HA, fever, SOB  Above symptoms started 1 week ago and worsened the day of presentation.  She also stated lightheadedness, weakness, sore throat, cough, chills, wheezing, dizziness.  She was becoming more dyspneic with exertion.  She had a fever of 102 a few days before.  She was coughing up phlegm which is greenish/brownish in color.  She initially went to urgent care but was found to be tachypneic and hypoxic and referred to the emergency room.  In the ED BP stable, sinus tachycardia, appeared short of breath, wheezing.  EKG with sinus tachycardia.  Speaking in full sentences.  Felt better after nebulizer treatment and antibiotics.  Will work showed leukocytosis 14,000, CR 1.1.  Lactic acid 1.2, procalcitonin 1.74.  Strep pneumo antigen negative.  Received IV fluids.  With preparation on room air.  Chest x-ray showed faint patchy opacities bilateral lower lobes, right greater than left.  COVID/flu/RSV negative.  Started on azithromycin , ceftriaxone  and Solu-Medrol .    Admitted to the hospital 9/23 for multifocal pneumonia Admitted to hospital at home program on 9/25  Multifocal pneumonia: Received 4 days of IV antibiotics-discharged with 2 more days of p.o. 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  She is coughing, but that has improved.  She is using a nebulizer treatments consistently twice a day and that does help with bringing phlegm up.  Drinking a good amount of water.    She is unsure if she is wheezing.  Denies any shortness of breath.    Energy level is better.  Appetite is good.  Overall she feels like she is getting stronger.   Medications and allergies reviewed with patient and updated if appropriate.  Current Outpatient Medications on File Prior to Visit  Medication Sig Dispense Refill   albuterol  (VENTOLIN  HFA) 108 (90 Base) MCG/ACT inhaler Inhale 2 puffs into the lungs every 6 (six) hours as needed for wheezing or shortness of breath. 18 g 3   allopurinol  (ZYLOPRIM ) 300 MG tablet TAKE 1 TABLET BY MOUTH DAILY 90 tablet 3   atorvastatin  (LIPITOR) 40 MG tablet Take 1 tablet (40 mg total) by mouth daily. 90 tablet 3   carvedilol  (COREG ) 12.5 MG tablet Take 1.5 tablets (18.75 mg total) by mouth 2 (two) times daily. 270 tablet 3   conjugated estrogens  (PREMARIN ) vaginal cream Place 1 Applicatorful vaginally 2 (two) times a week. 42.5 g 12   Fluticasone -Umeclidin-Vilant (TRELEGY ELLIPTA ) 100-62.5-25 MCG/ACT AEPB Inhale 1 puff into the lungs  daily. Plans to start bevespi  after completing rx for trelegy 60 each 5   furosemide  (LASIX ) 20 MG tablet Take 1 tablet (20 mg total) by mouth daily as needed. 45 tablet 6   gabapentin  (NEURONTIN ) 100 MG capsule TAKE 2 CAPSULES BY MOUTH IN THE  MORNING AND 3 CAPSULES BY MOUTH  AT  BEDTIME 450 capsule 3   guaiFENesin  (MUCINEX ) 600 MG 12 hr tablet Take 1 tablet (600 mg total) by mouth 2 (two) times daily.     ipratropium-albuterol  (DUONEB) 0.5-2.5 (3) MG/3ML SOLN Inhale 3 mLs by nebulization every 6 (six) hours as needed (SOB, wheezing). 360 mL 0   levothyroxine  (SYNTHROID ) 50 MCG tablet Take 1 tablet by mouth once daily 90 tablet 0   omeprazole  (PRILOSEC) 20 MG capsule Take 1 capsule (20 mg total) by mouth daily. TAKE 1 CAPSULE BY MOUTH  TWICE DAILY BEFORE A MEAL 90 capsule 3   RYBELSUS  7 MG TABS Take 1 tablet by mouth once daily 90 tablet 0   TYLENOL  500 MG tablet Take 500-1,000 mg by mouth every 6 (six) hours as needed for mild pain (pain score 1-3) or headache.     aspirin  EC 81 MG tablet Take 1 tablet (81 mg total) by mouth daily. Swallow whole. (Patient not taking: Reported on 12/20/2023) 30 tablet 12   No current facility-administered medications on file prior to visit.     Review of Systems  Constitutional:  Negative for appetite change, chills and fever.  Respiratory:  Positive for cough (still productive after neb treatments - getting better). Negative for chest tightness, shortness of breath and wheezing.   Cardiovascular:  Negative for chest pain, palpitations (chronic - no change) and leg swelling.  Gastrointestinal:  Negative for abdominal pain and nausea.       No gerd  Neurological:  Negative for light-headedness and headaches.       Objective:   Vitals:   12/20/23 0910  BP: 130/78  Pulse: 80  Temp: 98 F (36.7 C)  SpO2: 98%   BP Readings from Last 3 Encounters:  12/20/23 138/77  12/20/23 130/78  12/09/23 (!) 145/92   Wt Readings from Last 3 Encounters:  12/20/23 177 lb (80.3 kg)  12/20/23 177 lb (80.3 kg)  12/09/23 184 lb 3.2 oz (83.6 kg)   Body mass index is 29.45 kg/m.    Physical Exam Constitutional:      General: She is not in acute distress.    Appearance: Normal appearance.  HENT:     Head: Normocephalic and atraumatic.   Eyes:     Conjunctiva/sclera: Conjunctivae normal.  Cardiovascular:     Rate and Rhythm: Normal rate and regular rhythm.     Heart sounds: Normal heart sounds.  Pulmonary:     Effort: Pulmonary effort is normal. No respiratory distress.     Breath sounds: Wheezing (minimal) present. No rales.  Musculoskeletal:     Cervical back: Neck supple.     Right lower leg: No edema.     Left lower leg: No edema.  Lymphadenopathy:     Cervical: No cervical adenopathy.  Skin:    General: Skin is warm and dry.     Findings: No rash.  Neurological:     Mental Status: She is alert. Mental status is at baseline.  Psychiatric:        Mood and Affect: Mood normal.        Behavior: Behavior normal.        Lab Results  Component  Value Date   WBC 9.6 12/07/2023   HGB 10.5 (L) 12/07/2023   HCT 31.2 (L) 12/07/2023   PLT 191 12/07/2023   GLUCOSE 244 (H) 12/07/2023   CHOL 124 08/25/2023   TRIG 88 08/25/2023   HDL 56 08/25/2023   LDLDIRECT 74.0 08/20/2020   LDLCALC 51 08/25/2023   ALT 20 08/25/2023   AST 24 08/25/2023   NA 135 12/07/2023   K 3.7 12/07/2023   CL 101 12/07/2023   CREATININE 0.92 12/07/2023   BUN 27 (H) 12/07/2023   CO2 20 (L) 12/07/2023   TSH 2.30 08/25/2023   INR 1.08 01/28/2014   HGBA1C 5.7 (H) 08/25/2023   MICROALBUR 3.2 (H) 08/25/2023   DG Chest 2 View CLINICAL DATA:  Cough.  EXAM: DG CHEST 2V  COMPARISON:  Chest x-ray 06/27/2022  FINDINGS: There some faint patchy opacities in the bilateral lower lungs, right greater than left. The left lower lung opacity is slightly nodular. There is a small left pleural effusion. There is no pneumothorax. No acute fractures are seen. There surgical clips in the upper abdomen.  IMPRESSION: 1. Faint patchy opacities in the bilateral lower lungs, right greater than left. The left lower lung opacity is slightly nodular. Findings are concerning for infection. Follow-up chest x-ray recommended in 4-6 weeks to confirm  resolution. 2. Small left pleural effusion.  Electronically Signed   By: Greig Pique M.D.   On: 12/06/2023 15:11    Assessment & Plan:    See Problem List for Assessment and Plan of chronic medical problems.

## 2023-12-20 ENCOUNTER — Ambulatory Visit: Admitting: Internal Medicine

## 2023-12-20 ENCOUNTER — Ambulatory Visit (INDEPENDENT_AMBULATORY_CARE_PROVIDER_SITE_OTHER): Payer: Medicare Other

## 2023-12-20 VITALS — BP 130/78 | HR 80 | Temp 98.0°F | Ht 65.0 in | Wt 177.0 lb

## 2023-12-20 VITALS — BP 138/77 | HR 80 | Ht 65.0 in | Wt 177.0 lb

## 2023-12-20 DIAGNOSIS — I5042 Chronic combined systolic (congestive) and diastolic (congestive) heart failure: Secondary | ICD-10-CM | POA: Diagnosis not present

## 2023-12-20 DIAGNOSIS — I110 Hypertensive heart disease with heart failure: Secondary | ICD-10-CM

## 2023-12-20 DIAGNOSIS — Z7984 Long term (current) use of oral hypoglycemic drugs: Secondary | ICD-10-CM

## 2023-12-20 DIAGNOSIS — E039 Hypothyroidism, unspecified: Secondary | ICD-10-CM | POA: Diagnosis not present

## 2023-12-20 DIAGNOSIS — J188 Other pneumonia, unspecified organism: Secondary | ICD-10-CM | POA: Diagnosis not present

## 2023-12-20 DIAGNOSIS — J441 Chronic obstructive pulmonary disease with (acute) exacerbation: Secondary | ICD-10-CM | POA: Diagnosis not present

## 2023-12-20 DIAGNOSIS — Z0000 Encounter for general adult medical examination without abnormal findings: Secondary | ICD-10-CM

## 2023-12-20 DIAGNOSIS — N1831 Chronic kidney disease, stage 3a: Secondary | ICD-10-CM | POA: Diagnosis not present

## 2023-12-20 DIAGNOSIS — E1122 Type 2 diabetes mellitus with diabetic chronic kidney disease: Secondary | ICD-10-CM

## 2023-12-20 MED ORDER — LISINOPRIL 40 MG PO TABS: 40.0000 mg | ORAL_TABLET | Freq: Every day | ORAL | 3 refills | Status: DC

## 2023-12-20 MED ORDER — BENZONATATE 200 MG PO CAPS: 200.0000 mg | ORAL_CAPSULE | Freq: Three times a day (TID) | ORAL | 0 refills | Status: DC | PRN

## 2023-12-20 NOTE — Assessment & Plan Note (Signed)
 Chronic Echo 06/2022 with EF 40-45%, global LV hypokinesis, diastolic dysfunction indeterminate, but previous echo showed grade 1 DD Appears euvolemic Continue Lasix  20 mg daily as needed, Coreg  18.75 mg twice daily

## 2023-12-20 NOTE — Assessment & Plan Note (Addendum)
 Chronic Dehydrated when she first was admitted to the hospital Received IV fluids Gfr improved with IVF Discontinue hydrochlorothiazide   Continue lisinopril  but increase to 40 mg daily, coreg  18.75 mg bid Bmp in 2-3 weeks Monitor BP

## 2023-12-20 NOTE — Progress Notes (Signed)
 Subjective:   April Combs is a 78 y.o. who presents for a Medicare Wellness preventive visit.  As a reminder, Annual Wellness Visits don't include a physical exam, and some assessments may be limited, especially if this visit is performed virtually. We may recommend an in-person follow-up visit with your provider if needed.  Visit Complete: In person  Persons Participating in Visit: Patient.  AWV Questionnaire: No: Patient Medicare AWV questionnaire was not completed prior to this visit.  Cardiac Risk Factors include: advanced age (>20men, >53 women);diabetes mellitus;dyslipidemia;hypertension     Objective:    Today's Vitals   12/20/23 0954  BP: 138/77  Pulse: 80  SpO2: 98%  Weight: 177 lb (80.3 kg)  Height: 5' 5 (1.651 m)   Body mass index is 29.45 kg/m.     12/20/2023    9:53 AM 12/08/2023   12:20 PM 12/16/2022    9:35 AM 06/27/2022    2:54 PM 06/27/2022    9:31 AM 12/07/2021    1:26 PM 03/14/2021    1:40 PM  Advanced Directives  Does Patient Have a Medical Advance Directive? No No Yes  No Yes No  Type of Best boy of [place];Living will   Living will   Copy of Healthcare Power of Attorney in Chart?   No - copy requested      Would patient like information on creating a medical advance directive? No - Patient declined No - Patient declined  No - Patient declined   Yes (ED - Information included in AVS)    Current Medications (verified) Outpatient Encounter Medications as of 12/20/2023  Medication Sig   albuterol  (VENTOLIN  HFA) 108 (90 Base) MCG/ACT inhaler Inhale 2 puffs into the lungs every 6 (six) hours as needed for wheezing or shortness of breath.   allopurinol  (ZYLOPRIM ) 300 MG tablet TAKE 1 TABLET BY MOUTH DAILY   atorvastatin  (LIPITOR) 40 MG tablet Take 1 tablet (40 mg total) by mouth daily.   benzonatate  (TESSALON ) 200 MG capsule Take 1 capsule (200 mg total) by mouth 3 (three) times daily as needed for cough.   carvedilol   (COREG ) 12.5 MG tablet Take 1.5 tablets (18.75 mg total) by mouth 2 (two) times daily.   conjugated estrogens  (PREMARIN ) vaginal cream Place 1 Applicatorful vaginally 2 (two) times a week.   Fluticasone -Umeclidin-Vilant (TRELEGY ELLIPTA ) 100-62.5-25 MCG/ACT AEPB Inhale 1 puff into the lungs daily. Plans to start bevespi  after completing rx for trelegy   furosemide  (LASIX ) 20 MG tablet Take 1 tablet (20 mg total) by mouth daily as needed.   gabapentin  (NEURONTIN ) 100 MG capsule TAKE 2 CAPSULES BY MOUTH IN THE  MORNING AND 3 CAPSULES BY MOUTH  AT BEDTIME   guaiFENesin  (MUCINEX ) 600 MG 12 hr tablet Take 1 tablet (600 mg total) by mouth 2 (two) times daily.   ipratropium-albuterol  (DUONEB) 0.5-2.5 (3) MG/3ML SOLN Inhale 3 mLs by nebulization every 6 (six) hours as needed (SOB, wheezing).   levothyroxine  (SYNTHROID ) 50 MCG tablet Take 1 tablet by mouth once daily   lisinopril  (ZESTRIL ) 40 MG tablet Take 1 tablet (40 mg total) by mouth daily.   omeprazole  (PRILOSEC) 20 MG capsule Take 1 capsule (20 mg total) by mouth daily. TAKE 1 CAPSULE BY MOUTH  TWICE DAILY BEFORE A MEAL   RYBELSUS  7 MG TABS Take 1 tablet by mouth once daily   TYLENOL  500 MG tablet Take 500-1,000 mg by mouth every 6 (six) hours as needed for mild pain (pain score 1-3)  or headache.   aspirin  EC 81 MG tablet Take 1 tablet (81 mg total) by mouth daily. Swallow whole. (Patient not taking: Reported on 12/20/2023)   No facility-administered encounter medications on file as of 12/20/2023.    Allergies (verified) Patient has no known allergies.   History: Past Medical History:  Diagnosis Date   Anemia    Anxiety    BRBPR (bright red blood per rectum) 02/05/2015   Chest pain on exertion 01/14/2014   Chronic kidney disease    Coronary artery disease (CAD) excluded 01/2014   Low Risk Myoview  (false positive) with possible inferior-inferolateral ischemia, but with nonsustained VT --> CATH --> Angiographically Normal Coronary Arteries;  coronary CT angiogram showed mild approximately disease but otherwise no obstructive disease.  Coronary calcium  score 40.   Depression    Emphysema of lung (HCC)    FALSE POSITIVE NUCLEAR STRESS CHEST 01/28/2014   Frequent UTI 08/25/2023   Fungal dermatitis 09/29/2011   GERD (gastroesophageal reflux disease)    Gout 04/10/2017   First episode - Left foot 03/2017   Hx of tobacco use, presenting hazards to health 09/29/2014   Hyperlipidemia    Hyperlipidemia with target LDL less than 100 09/29/2011   Hyperplastic colon polyp    Hypertension    Hypertensive 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 (HCC) 04/26/2017   HYPERTRIGLYCERIDEMIA 07/12/2008   Qualifier: Diagnosis of  By: Mahlon MD, Katherine     Hypothyroidism 02/09/2017   Irregular heart beat 09/29/2011   Lumbar radiculopathy 12/24/2014   Morbid obesity (HCC) 10/15/2015   NICM (nonischemic cardiomyopathy) (HCC)    Essentially resolved. Echocardiogram July 2013 showed normal EF greater than 55%. Important septal motion. Normal LV filling pressures. Mild MR and mild anterior MVP   Obesity (BMI 30-39.9)    Osteoarthritis    Positional vertigo    Prediabetes 08/10/2017   Pulmonary emphysema (HCC) 04/29/2015   PFTs 2017 - moderate-severe obstructive disease, some restrictive disease   SOB (shortness of breath) 08/23/2011   Past Surgical History:  Procedure Laterality Date   ABDOMINAL HYSTERECTOMY     APPENDECTOMY  as child   BREAST BIOPSY Right    benign   CHOLECYSTECTOMY     COLONOSCOPY     CORONARY CALCIUM  SCORE/CT ANGIOGRAM  07/2017   Calcium  score 40.  Mild LAD stenosis, but no other significant disease.   DOPPLER ECHOCARDIOGRAPHY  July 2013   09/20/11. normal Lv thickness and function with EF greater thatn 55%   Exercise tolerance test - CPET-MET-TEST  July 2013   Submaximal effort. Only 80% of her, therefore peak VO2 of 75% is likely an underestimate. High resting heart rate, achieving 86% of heart  rate. --> Unable to interpret due to lack of effort.   GASTRIC BYPASS  1980   GASTRIC BYPASS     KIDNEY STONE SURGERY     LEFT HEART CATHETERIZATION WITH CORONARY ANGIOGRAM N/A 02/01/2014   Procedure: LEFT HEART CATHETERIZATION WITH CORONARY ANGIOGRAM;  Surgeon: Alm LELON Clay, MD;  Location[facility] CATH LAB;  Service: Cardiovascular: Angiographically normal coronaries   NM MYOVIEW  LTD  November 2015   4:20 min, 4.6 METS --> DOE, but no chest discomfort; Significant + ST -T wave changes noted with NSVT & PVCs. Images suggest Inferior-inferolateral ischemia.  Low Risk. -- FALSE POSITIVE   Pulmonary Function Tests  July 2013   Increased densities, decreased FVC - consistent with obesity hypoventilation; FEV1 is 58%, FVC 60% of predicted.   TRANSTHORACIC ECHOCARDIOGRAM  04/2017  Mildly reduced EF of 45%.  No regional wall motion normality.  GR 1 DD   UPPER GASTROINTESTINAL ENDOSCOPY     Family History  Problem Relation Age of Onset   Kidney disease Daughter    Multiple sclerosis Daughter    Heart disease Father    Heart disease Mother    Thyroid  disease Mother    Heart disease Paternal Grandfather    Colon cancer Other        early 71's.  Genetic testing from maternal side of family.   Colon cancer Maternal Aunt    Breast cancer Maternal Aunt    Lung cancer Sister        smoker   Diabetes Sister    Colon cancer Maternal Uncle    Diabetes Daughter        x3   Heart disease Maternal Aunt    Colon polyps Neg Hx    Esophageal cancer Neg Hx    Gallbladder disease Neg Hx    Rectal cancer Neg Hx    Stomach cancer Neg Hx    Social History   Socioeconomic History   Marital status: Widowed    Spouse name: Not on file   Number of children: 3   Years of education: Not on file   Highest education level: 12th grade  Occupational History   Occupation: Retired  Tobacco Use   Smoking status: Former    Current packs/day: 0.00    Average packs/day: 1 pack/day for 30.0 years (30.0 ttl  pk-yrs)    Types: Cigarettes    Start date: 01/17/1973    Quit date: 01/18/2003    Years since quitting: 20.9   Smokeless tobacco: Never  Vaping Use   Vaping status: Never Used  Substance and Sexual Activity   Alcohol use: Not Currently   Drug use: No   Sexual activity: Not Currently  Other Topics Concern   Not on file  Social History Narrative   She is widowed-husband, Dempsey Finder, was also a patient of Dr. Anner.   Mother of 3, grandmother 81.   Quit smoking in 2005.    Social alcohol.   No routine exercise.   Social Drivers of Corporate investment banker Strain: Low Risk  (12/20/2023)   Overall Financial Resource Strain (CARDIA)    Difficulty of Paying Living Expenses: Not hard at all  Food Insecurity: No Food Insecurity (12/20/2023)   Hunger Vital Sign    Worried About Running Out of Food in the Last Year: Never true    Ran Out of Food in the Last Year: Never true  Recent Concern: Food Insecurity - Food Insecurity Present (12/06/2023)   Hunger Vital Sign    Worried About Running Out of Food in the Last Year: Never true    Ran Out of Food in the Last Year: Sometimes true  Transportation Needs: No Transportation Needs (12/20/2023)   PRAPARE - Administrator, Civil Service (Medical): No    Lack of Transportation (Non-Medical): No  Physical Activity: Insufficiently Active (12/20/2023)   Exercise Vital Sign    Days of Exercise per Week: 2 days    Minutes of Exercise per Session: 20 min  Stress: No Stress Concern Present (12/20/2023)   [employer] of Occupational Health - Occupational Stress Questionnaire    Feeling of Stress: Not at all  Social Connections: Socially Isolated (12/20/2023)   Social Connection and Isolation Panel    Frequency of Communication with Friends and Family: More than three  times a week    Frequency of Social Gatherings with Friends and Family: More than three times a week    Attends Religious Services: Never    Database administrator  or Organizations: No    Attends Banker Meetings: Never    Marital Status: Widowed    Tobacco Counseling Counseling given: Not Answered    Clinical Intake:  Pre-visit preparation completed: Yes  Pain : No/denies pain     BMI - recorded: 29.45 Nutritional Status: BMI 25 -29 Overweight Nutritional Risks: None Diabetes: No  Lab Results  Component Value Date   HGBA1C 5.7 (H) 08/25/2023   HGBA1C 6.2 02/24/2023   HGBA1C 5.7 08/25/2022     How often do you need to have someone help you when you read instructions, pamphlets, or other written materials from your doctor or pharmacy?: 1 - Never  Interpreter Needed?: No  Information entered by :: Verdie Saba, CMA   Activities of Daily Living     12/20/2023    9:56 AM 12/08/2023    4:38 PM  In your present state of health, do you have any difficulty performing the following activities:  Hearing? 0   Vision? 0   Difficulty concentrating or making decisions? 0   Walking or climbing stairs? 0   Dressing or bathing? 0   Doing errands, shopping? 0 1  Preparing Food and eating ? N   Using the Toilet? N   In the past six months, have you accidently leaked urine? N   Do you have problems with loss of bowel control? N   Managing your Medications? N   Managing your Finances? N   Housekeeping or managing your Housekeeping? N     Patient Care Team: Geofm Glade PARAS, MD as PCP - General (Internal Medicine) Anner Alm ORN, MD as PCP - Cardiology (Cardiology) Anner Alm ORN, MD as Consulting Physician (Cardiology) Obie Princella HERO, MD (Inactive) as Consulting Physician (Gastroenterology) Geronimo Amel, MD as Consulting Physician (Pulmonary Disease) Szabat, Toribio BROCKS, [facility] (Inactive) (Pharmacist)  I have updated your Care Teams any recent Medical Services you may have received from other providers in the past year.     Assessment:   This is a routine wellness examination for April Combs.  Hearing/Vision  screen Hearing Screening - Comments:: Denies hearing difficulties   Vision Screening - Comments:: Denies vision concerns   Goals Addressed               This Visit's Progress     Patient Stated (pt-stated)        Patient stated she plans to continue walking       Depression Screen     12/20/2023    9:57 AM 12/20/2023    9:20 AM 02/24/2023    7:55 AM 12/16/2022    9:30 AM 11/02/2022    1:02 PM 07/07/2022    8:02 AM 02/19/2022    7:49 AM  PHQ 2/9 Scores  PHQ - 2 Score 0 0 0 0 0 0 0  PHQ- 9 Score 0 0 0 0 0      Fall Risk     12/20/2023    9:56 AM 12/20/2023    9:20 AM 08/25/2023    8:05 AM 05/27/2023    4:01 PM 02/24/2023    7:54 AM  Fall Risk   Falls in the past year? 0 0 0  0  Number falls in past yr: 0 0 0 0 0  Injury with Fall? 0  0 0 0 0  Risk for fall due to : No Fall Risks No Fall Risks   No Fall Risks  Follow up Falls evaluation completed;Falls prevention discussed Falls evaluation completed Falls evaluation completed Falls evaluation completed Falls evaluation completed    MEDICARE RISK AT HOME:  Medicare Risk at Home Any stairs in or around the home?: No If so, are there any without handrails?: No Home free of loose throw rugs in walkways, pet beds, electrical cords, etc?: Yes Adequate lighting in your home to reduce risk of falls?: Yes Life alert?: No Use of a cane, walker or w/c?: No Grab bars in the bathroom?: No Shower chair or bench in shower?: No Elevated toilet seat or a handicapped toilet?: No  TIMED UP AND GO:  Was the test performed?  No  Cognitive Function: 6CIT completed        12/20/2023    9:57 AM 12/16/2022    9:35 AM 12/07/2021    1:25 PM  6CIT Screen  What Year? 0 points 0 points 0 points  What month? 0 points 0 points 0 points  What time? 0 points 0 points 0 points  Count back from 20 0 points 0 points 0 points  Months in reverse 0 points 0 points 0 points  Repeat phrase 0 points 0 points 0 points  Total Score 0 points 0 points  0 points    Immunizations Immunization History  Administered Date(s) Administered   Fluad Quad(high Dose 65+) 11/09/2018   INFLUENZA, HIGH DOSE SEASONAL PF 01/28/2015, 12/24/2015, 12/13/2016, 11/16/2017, 12/28/2019, 12/14/2022   Influenza,inj,Quad PF,6+ Mos 12/24/2015   Influenza-Unspecified 12/11/2021   Janssen (J&J) SARS-COV-2 Vaccination 06/01/2019, 12/28/2019   PNEUMOCOCCAL CONJUGATE-20 12/14/2022   Pneumococcal Conjugate-13 09/19/2013   Pneumococcal Polysaccharide-23 09/25/2014   Tdap 02/29/2008    Screening Tests Health Maintenance  Topic Date Due   OPHTHALMOLOGY EXAM  Never done   Zoster Vaccines- Shingrix (1 of 2) Never done   Influenza Vaccine  10/14/2023   COVID-19 Vaccine (3 - 2025-26 season) 11/14/2023   DTaP/Tdap/Td (2 - Td or Tdap) 02/24/2024 (Originally 02/28/2018)   DEXA SCAN  02/24/2024 (Originally 03/15/2017)   FOOT EXAM  02/24/2024   HEMOGLOBIN A1C  02/24/2024   Diabetic kidney evaluation - Urine ACR  08/24/2024   Diabetic kidney evaluation - eGFR measurement  12/06/2024   Medicare Annual Wellness (AWV)  12/19/2024   Pneumococcal Vaccine: 50+ Years  Completed   Hepatitis C Screening  Completed   Meningococcal B Vaccine  Aged Out   Colonoscopy  Discontinued    Health Maintenance Items Addressed:  12/20/2023  Additional Screening:  Vision Screening: Recommended annual ophthalmology exams for early detection of glaucoma and other disorders of the eye. Is the patient up to date with their annual eye exam?  No  Who is the provider or what is the name of the office in which the patient attends annual eye exams? Pt declined to f/u w/an Optometrist although last eye exam was >55yrs ago.  Pt denies vision concerns.  Dental Screening: Recommended annual dental exams for proper oral hygiene  Community Resource Referral / Chronic Care Management: CRR required this visit?  No   CCM required this visit?  No   Plan:    I have personally reviewed and noted the  following in the patient's chart:   Medical and social history Use of alcohol, tobacco or illicit drugs  Current medications and supplements including opioid prescriptions. Patient is not currently taking opioid prescriptions. Functional ability  and status Nutritional status Physical activity Advanced directives List of other physicians Hospitalizations, surgeries, and ER visits in previous 12 months Vitals Screenings to include cognitive, depression, and falls Referrals and appointments  In addition, I have reviewed and discussed with patient certain preventive protocols, quality metrics, and best practice recommendations. A written personalized care plan for preventive services as well as general preventive health recommendations were provided to patient.   Verdie CHRISTELLA Saba, CMA   12/20/2023   After Visit Summary: (In Person-Declined) Patient declined AVS at this time.  Notes: Nothing significant to report at this time.

## 2023-12-20 NOTE — Patient Instructions (Addendum)
    A chest x-ray was ordered and blood work was ordered -have this done around 10/28.    Medications changes include :   stop lisinopril -hydrochlorothiazide  and replace it with lisinopril  40 mg daily.         Return for follow up as scheduled.

## 2023-12-20 NOTE — Assessment & Plan Note (Addendum)
 Chronic Blood pressure controlled, appears euvolemic Did have recent dehydration and I was mild CKD so we will discontinue HCTZ Increase lisinopril  to 40 mg daily and continue Coreg  18.75 mg twice daily BMP in 2-3

## 2023-12-20 NOTE — Assessment & Plan Note (Signed)
 Chronic  Clinically euthyroid Lab Results  Component Value Date   TSH 2.30 08/25/2023   Continue levothyroxine  50 mcg daily

## 2023-12-20 NOTE — Assessment & Plan Note (Addendum)
 Acute-recent hospitalization for multifocal pneumonia with COPD exacerbation Initially placed on Solu-Medrol -changed to oral prednisone -completed 7 days of steroids DuoNebs, antibiotics Symptoms improved Currently doing Trelegy once daily and neb treatments twice daily which is helping to get up mucus-continue Sees pulmonary at [facility] tomorrow

## 2023-12-20 NOTE — Patient Instructions (Addendum)
 April Combs,  Thank you for taking the time for your Medicare Wellness Visit. I appreciate your continued commitment to your health goals. Please review the care plan we discussed, and feel free to reach out if I can assist you further.  Medicare recommends these wellness visits once per year to help you and your care team stay ahead of potential health issues. These visits are designed to focus on prevention, allowing your provider to concentrate on managing your acute and chronic conditions during your regular appointments.  Please note that Annual Wellness Visits do not include a physical exam. Some assessments may be limited, especially if the visit was conducted virtually. If needed, we may recommend a separate in-person follow-up with your provider.  Ongoing Care Seeing your primary care provider every 3 to 6 months helps us  monitor your health and provide consistent, personalized care.   Referrals If a referral was made during today's visit and you haven't received any updates within two weeks, please contact the referred provider directly to check on the status.  Recommended Screenings:  Health Maintenance  Topic Date Due   Eye exam for diabetics  Never done   Zoster (Shingles) Vaccine (1 of 2) Never done   Flu Shot  10/14/2023   COVID-19 Vaccine (3 - 2025-26 season) 11/14/2023   DTaP/Tdap/Td vaccine (2 - Td or Tdap) 02/24/2024*   DEXA scan (bone density measurement)  02/24/2024*   Complete foot exam   02/24/2024   Hemoglobin A1C  02/24/2024   Yearly kidney health urinalysis for diabetes  08/24/2024   Yearly kidney function blood test for diabetes  12/06/2024   Medicare Annual Wellness Visit  12/19/2024   Pneumococcal Vaccine for age over 51  Completed   Hepatitis C Screening  Completed   Meningitis B Vaccine  Aged Out   Colon Cancer Screening  Discontinued  *Topic was postponed. The date shown is not the original due date.       12/20/2023    9:53 AM  Advanced Directives   Does Patient Have a Medical Advance Directive? No  Would patient like information on creating a medical advance directive? No - Patient declined   Advance Care Planning is important because it: Ensures you receive medical care that aligns with your values, goals, and preferences. Provides guidance to your family and loved ones, reducing the emotional burden of decision-making during critical moments.  Vision: Annual vision screenings are recommended for early detection of glaucoma, cataracts, and diabetic retinopathy. These exams can also reveal signs of chronic conditions such as diabetes and high blood pressure.  Dental: Annual dental screenings help detect early signs of oral cancer, gum disease, and other conditions linked to overall health, including heart disease and diabetes.

## 2023-12-20 NOTE — Assessment & Plan Note (Addendum)
 Recent hospitalization for multifocal pneumonia Treated with Rocephin , azithromycin  while in the hospital-changed to oral Duricef, azithromycin  on discharge-completed Symptoms much improved Needs repeat x-ray to follow-up resolution-order that she can have this done in 3 weeks Continue Trelegy, nebulized treatments twice daily

## 2023-12-20 NOTE — Assessment & Plan Note (Signed)
 Chronic Associated with chronic kidney disease stage 3a  Lab Results  Component Value Date   HGBA1C 5.7 (H) 08/25/2023   Sugars well controlled Continue Rybelsus  7 mg daily Stressed diabetic diet

## 2023-12-21 DIAGNOSIS — Z23 Encounter for immunization: Secondary | ICD-10-CM | POA: Diagnosis not present

## 2023-12-21 DIAGNOSIS — J449 Chronic obstructive pulmonary disease, unspecified: Secondary | ICD-10-CM | POA: Diagnosis not present

## 2024-01-10 ENCOUNTER — Ambulatory Visit (INDEPENDENT_AMBULATORY_CARE_PROVIDER_SITE_OTHER)

## 2024-01-10 ENCOUNTER — Other Ambulatory Visit (INDEPENDENT_AMBULATORY_CARE_PROVIDER_SITE_OTHER)

## 2024-01-10 DIAGNOSIS — J188 Other pneumonia, unspecified organism: Secondary | ICD-10-CM | POA: Diagnosis not present

## 2024-01-10 DIAGNOSIS — N1831 Chronic kidney disease, stage 3a: Secondary | ICD-10-CM | POA: Diagnosis not present

## 2024-01-10 LAB — BASIC METABOLIC PANEL WITH GFR
BUN: 13 mg/dL (ref 6–23)
CO2: 24 meq/L (ref 19–32)
Calcium: 8.2 mg/dL — ABNORMAL LOW (ref 8.4–10.5)
Chloride: 107 meq/L (ref 96–112)
Creatinine, Ser: 0.71 mg/dL (ref 0.40–1.20)
GFR: 82.03 mL/min (ref >60.00)
Glucose, Bld: 97 mg/dL (ref 70–99)
Potassium: 4.2 meq/L (ref 3.5–5.1)
Sodium: 141 meq/L (ref 135–145)

## 2024-01-11 ENCOUNTER — Ambulatory Visit: Payer: Self-pay | Admitting: Internal Medicine

## 2024-01-17 ENCOUNTER — Other Ambulatory Visit: Payer: Self-pay | Admitting: Cardiology

## 2024-01-29 ENCOUNTER — Other Ambulatory Visit: Payer: Self-pay | Admitting: Internal Medicine

## 2024-02-06 ENCOUNTER — Other Ambulatory Visit: Payer: Self-pay | Admitting: Cardiology

## 2024-02-06 DIAGNOSIS — I10 Essential (primary) hypertension: Secondary | ICD-10-CM

## 2024-02-09 ENCOUNTER — Other Ambulatory Visit: Payer: Self-pay | Admitting: Internal Medicine

## 2024-02-13 ENCOUNTER — Telehealth: Payer: Self-pay

## 2024-02-13 ENCOUNTER — Other Ambulatory Visit: Payer: Self-pay

## 2024-02-13 MED ORDER — LISINOPRIL 40 MG PO TABS: 40.0000 mg | ORAL_TABLET | Freq: Every day | ORAL | 3 refills | Status: AC

## 2024-02-13 MED ORDER — ATORVASTATIN CALCIUM 40 MG PO TABS: 40.0000 mg | ORAL_TABLET | Freq: Every day | ORAL | 3 refills | Status: AC

## 2024-02-13 NOTE — Telephone Encounter (Signed)
 Copied from CRM #[id number]. Topic: Clinical - Prescription Issue >> Feb 13, 2024 10:24 AM Wess RAMAN wrote: Reason for CRM: Reason for CRM: Patient stated the pharmacy needs verification for refills for lisinopril  (ZESTRIL ) 40 MG tablet and atorvastatin  (LIPITOR) 40 MG tablet   Callback #: [id number]  Pharmacy: [facility] [place] [place] - [address] [place] [address] Phone: [phone] Fax: [phone] Hours: Not open 24 hours

## 2024-02-13 NOTE — Telephone Encounter (Signed)
 Copied from CRM #[id number]. Topic: Clinical - Prescription Issue >> Feb 13, 2024 10:22 AM Wess RAMAN wrote: Reason for CRM: Patient stated the pharmacy needs verification for refills for RYBELSUS  7 MG TABS and levothyroxine  (SYNTHROID ) 50 MCG tablet  Callback #: [id number]  Pharmacy: [facility] [address]), [place] - [address] [place] ([place]) [place] [address] Phone: [phone] Fax: [phone] Hours: Not open 24 hours

## 2024-02-13 NOTE — Telephone Encounter (Signed)
 Spoke with patient today.

## 2024-02-13 NOTE — Telephone Encounter (Signed)
 Refills sent today.

## 2024-02-13 NOTE — Telephone Encounter (Signed)
 Spoke with patient and refills were sent this morning.

## 2024-02-23 ENCOUNTER — Encounter: Payer: Self-pay | Admitting: Internal Medicine

## 2024-02-23 NOTE — Progress Notes (Unsigned)
 Subjective:    Patient ID: April Combs, female    DOB: 1945-08-20, 78 y.o.   MRN: [id number]     HPI April Combs is here for follow up of her chronic medical problems.   Not sleeping good x 1 month. Will not fall asleep until 4am.  Denies anxiety, depression.  Drinking less caffeine.  Does not sleep during the day.  She does not want to take anything.    Was out of thyroid  medication for about 8 days about 2 weeks ago  Medications and allergies reviewed with patient and updated if appropriate.  Medications Ordered Prior to Encounter[1]   Review of Systems  Constitutional:  Negative for fever.  Respiratory:  Negative for cough, shortness of breath and wheezing.   Cardiovascular:  Negative for chest pain, palpitations and leg swelling.  Neurological:  Positive for headaches. Negative for light-headedness.       Objective:   Vitals:   02/24/24 0751  BP: 132/70  Pulse: 80  Temp: 98 F (36.7 C)  SpO2: 95%   BP Readings from Last 3 Encounters:  02/24/24 132/70  12/20/23 138/77  12/20/23 130/78   Wt Readings from Last 3 Encounters:  02/24/24 164 lb (74.4 kg)  12/20/23 177 lb (80.3 kg)  12/20/23 177 lb (80.3 kg)   Body mass index is 27.29 kg/m.    Physical Exam Constitutional:      General: She is not in acute distress.    Appearance: Normal appearance.  HENT:     Head: Normocephalic and atraumatic.  Eyes:     Conjunctiva/sclera: Conjunctivae normal.  Cardiovascular:     Rate and Rhythm: Normal rate and regular rhythm.     Heart sounds: Normal heart sounds.  Pulmonary:     Effort: Pulmonary effort is normal. No respiratory distress.     Breath sounds: Normal breath sounds. No wheezing.  Musculoskeletal:     Cervical back: Neck supple.     Right lower leg: No edema.     Left lower leg: No edema.  Lymphadenopathy:     Cervical: No cervical adenopathy.  Skin:    General: Skin is warm and dry.     Findings: No rash.  Neurological:     Mental  Status: She is alert. Mental status is at baseline.  Psychiatric:        Mood and Affect: Mood normal.        Behavior: Behavior normal.       Diabetic Foot Exam - Simple   Simple Foot Form Diabetic Foot exam was performed with the following findings: Yes 02/24/2024  8:13 AM  Visual Inspection No deformities, no ulcerations, no other skin breakdown bilaterally: Yes Sensation Testing Intact to touch and monofilament testing bilaterally: Yes Pulse Check Posterior Tibialis and Dorsalis pulse intact bilaterally: Yes Comments Slight dryness, onychomycosis of toenails      Lab Results  Component Value Date   WBC 9.6 12/07/2023   HGB 10.5 (L) 12/07/2023   HCT 31.2 (L) 12/07/2023   PLT 191 12/07/2023   GLUCOSE 97 01/10/2024   CHOL 124 08/25/2023   TRIG 88 08/25/2023   HDL 56 08/25/2023   LDLDIRECT 74.0 08/20/2020   LDLCALC 51 08/25/2023   ALT 20 08/25/2023   AST 24 08/25/2023   NA 141 01/10/2024   K 4.2 01/10/2024   CL 107 01/10/2024   CREATININE 0.71 01/10/2024   BUN 13 01/10/2024   CO2 24 01/10/2024   TSH 2.30 08/25/2023  INR 1.08 01/28/2014   HGBA1C 5.7 (H) 08/25/2023   MICROALBUR 3.2 (H) 08/25/2023     Assessment & Plan:    See Problem List for Assessment and Plan of chronic medical problems.       [1]  Current Outpatient Medications on File Prior to Visit  Medication Sig Dispense Refill   albuterol  (VENTOLIN  HFA) 108 (90 Base) MCG/ACT inhaler Inhale 2 puffs into the lungs every 6 (six) hours as needed for wheezing or shortness of breath. 18 g 3   allopurinol  (ZYLOPRIM ) 300 MG tablet TAKE 1 TABLET BY MOUTH DAILY 90 tablet 3   aspirin  EC 81 MG tablet Take 1 tablet (81 mg total) by mouth daily. Swallow whole. 30 tablet 12   atorvastatin  (LIPITOR) 40 MG tablet Take 1 tablet (40 mg total) by mouth daily. 90 tablet 3   carvedilol  (COREG ) 12.5 MG tablet Take 1.5 tablets (18.75 mg total) by mouth 2 (two) times daily. 270 tablet 3   conjugated estrogens  (PREMARIN )  vaginal cream Place 1 Applicatorful vaginally 2 (two) times a week. 42.5 g 12   Fluticasone -Umeclidin-Vilant (TRELEGY ELLIPTA ) 200-62.5-25 MCG/ACT AEPB Inhale into the lungs.     furosemide  (LASIX ) 20 MG tablet TAKE 1 TABLET BY MOUTH ONCE DAILY AS NEEDED 90 tablet 0   gabapentin  (NEURONTIN ) 100 MG capsule TAKE 2 CAPSULES BY MOUTH IN THE  MORNING AND 3 CAPSULES BY MOUTH  AT BEDTIME 450 capsule 3   guaiFENesin  (MUCINEX ) 600 MG 12 hr tablet Take 1 tablet (600 mg total) by mouth 2 (two) times daily.     ipratropium-albuterol  (DUONEB) 0.5-2.5 (3) MG/3ML SOLN Inhale 3 mLs by nebulization every 6 (six) hours as needed (SOB, wheezing). 360 mL 0   levothyroxine  (SYNTHROID ) 50 MCG tablet Take 1 tablet by mouth once daily 90 tablet 0   lisinopril  (ZESTRIL ) 40 MG tablet Take 1 tablet (40 mg total) by mouth daily. 90 tablet 3   omeprazole  (PRILOSEC) 20 MG capsule Take 1 capsule (20 mg total) by mouth daily. TAKE 1 CAPSULE BY MOUTH  TWICE DAILY BEFORE A MEAL 90 capsule 3   RYBELSUS  7 MG TABS Take 1 tablet by mouth once daily 90 tablet 0   TYLENOL  500 MG tablet Take 500-1,000 mg by mouth every 6 (six) hours as needed for mild pain (pain score 1-3) or headache.     No current facility-administered medications on file prior to visit.

## 2024-02-23 NOTE — Patient Instructions (Addendum)
° ° ° ° °  Blood work was ordered.       Medications changes include :   None      Return in about 6 months (around 08/24/2024) for follow up.

## 2024-02-24 ENCOUNTER — Ambulatory Visit: Admitting: Internal Medicine

## 2024-02-24 VITALS — BP 132/70 | HR 80 | Temp 98.0°F | Ht 65.0 in | Wt 164.0 lb

## 2024-02-24 DIAGNOSIS — E1159 Type 2 diabetes mellitus with other circulatory complications: Secondary | ICD-10-CM | POA: Diagnosis not present

## 2024-02-24 DIAGNOSIS — N1831 Chronic kidney disease, stage 3a: Secondary | ICD-10-CM | POA: Diagnosis not present

## 2024-02-24 DIAGNOSIS — E559 Vitamin D deficiency, unspecified: Secondary | ICD-10-CM

## 2024-02-24 DIAGNOSIS — E538 Deficiency of other specified B group vitamins: Secondary | ICD-10-CM

## 2024-02-24 DIAGNOSIS — I152 Hypertension secondary to endocrine disorders: Secondary | ICD-10-CM | POA: Diagnosis not present

## 2024-02-24 DIAGNOSIS — E1169 Type 2 diabetes mellitus with other specified complication: Secondary | ICD-10-CM | POA: Diagnosis not present

## 2024-02-24 DIAGNOSIS — E66811 Obesity, class 1: Secondary | ICD-10-CM

## 2024-02-24 DIAGNOSIS — J449 Chronic obstructive pulmonary disease, unspecified: Secondary | ICD-10-CM

## 2024-02-24 DIAGNOSIS — K219 Gastro-esophageal reflux disease without esophagitis: Secondary | ICD-10-CM

## 2024-02-24 DIAGNOSIS — E1122 Type 2 diabetes mellitus with diabetic chronic kidney disease: Secondary | ICD-10-CM | POA: Diagnosis not present

## 2024-02-24 DIAGNOSIS — M109 Gout, unspecified: Secondary | ICD-10-CM

## 2024-02-24 DIAGNOSIS — E039 Hypothyroidism, unspecified: Secondary | ICD-10-CM

## 2024-02-24 DIAGNOSIS — E785 Hyperlipidemia, unspecified: Secondary | ICD-10-CM | POA: Diagnosis not present

## 2024-02-24 DIAGNOSIS — I5042 Chronic combined systolic (congestive) and diastolic (congestive) heart failure: Secondary | ICD-10-CM

## 2024-02-24 DIAGNOSIS — I2510 Atherosclerotic heart disease of native coronary artery without angina pectoris: Secondary | ICD-10-CM

## 2024-02-24 LAB — CBC
HCT: 37.9 % (ref 36.0–46.0)
Hemoglobin: 12.6 g/dL (ref 12.0–15.0)
MCHC: 33.2 g/dL (ref 30.0–36.0)
MCV: 99.8 fl (ref 78.0–100.0)
Platelets: 143 K/uL — ABNORMAL LOW (ref 150.0–400.0)
RBC: 3.79 Mil/uL — ABNORMAL LOW (ref 3.87–5.11)
RDW: 15.9 % — ABNORMAL HIGH (ref 11.5–15.5)
WBC: 4.3 K/uL (ref 4.0–10.5)

## 2024-02-24 LAB — LIPID PANEL
Cholesterol: 134 mg/dL (ref 0–200)
HDL: 60.1 mg/dL (ref >39.00)
LDL Cholesterol: 57 mg/dL (ref 0–99)
NonHDL: 73.73
Total CHOL/HDL Ratio: 2
Triglycerides: 82 mg/dL (ref 0.0–149.0)
VLDL: 16.4 mg/dL (ref 0.0–40.0)

## 2024-02-24 LAB — COMPREHENSIVE METABOLIC PANEL WITH GFR
ALT: 13 U/L (ref 0–35)
AST: 21 U/L (ref 0–37)
Albumin: 4 g/dL (ref 3.5–5.2)
Alkaline Phosphatase: 72 U/L (ref 39–117)
BUN: 14 mg/dL (ref 6–23)
CO2: 29 meq/L (ref 19–32)
Calcium: 8.9 mg/dL (ref 8.4–10.5)
Chloride: 106 meq/L (ref 96–112)
Creatinine, Ser: 0.9 mg/dL (ref 0.40–1.20)
GFR: 61.66 mL/min (ref >60.00)
Glucose, Bld: 84 mg/dL (ref 70–99)
Potassium: 4 meq/L (ref 3.5–5.1)
Sodium: 143 meq/L (ref 135–145)
Total Bilirubin: 0.5 mg/dL (ref 0.2–1.2)
Total Protein: 6.4 g/dL (ref 6.0–8.3)

## 2024-02-24 LAB — HEMOGLOBIN A1C: Hgb A1c MFr Bld: 5.1 % (ref 4.6–6.5)

## 2024-02-24 LAB — TSH: TSH: 4.97 u[IU]/mL (ref 0.35–5.50)

## 2024-02-24 LAB — URIC ACID: Uric Acid, Serum: 3.5 mg/dL (ref 2.4–7.0)

## 2024-02-24 LAB — VITAMIN B12: Vitamin B-12: 296 pg/mL (ref 211–911)

## 2024-02-24 LAB — VITAMIN D 25 HYDROXY (VIT D DEFICIENCY, FRACTURES): VITD: 17.4 ng/mL — ABNORMAL LOW (ref 30.00–100.00)

## 2024-02-24 NOTE — Assessment & Plan Note (Signed)
 Chronic  Clinically euthyroid Lab Results  Component Value Date   TSH 2.30 08/25/2023  Check TSH  Continue levothyroxine  50 mcg daily

## 2024-02-24 NOTE — Assessment & Plan Note (Addendum)
 Chronic Stage 3a-improved after diuretic Check CBC, CMP Continue lisinopril   40 mg daily, coreg  18.75 mg bid Monitor BP

## 2024-02-24 NOTE — Assessment & Plan Note (Signed)
 Chronic Echo 06/2022 with EF 40-45%, global LV hypokinesis, diastolic dysfunction indeterminate, but previous echo showed grade 1 DD Appears euvolemic Continue Lasix  20 mg daily as needed, Coreg  18.75 mg twice daily

## 2024-02-24 NOTE — Assessment & Plan Note (Signed)
 Chronic Controlled Chronic shortness of breath-stable She is using her inhalers as prescribed-Trelegy once a day and albuterol  every 6 hours as needed Following with pulmonary

## 2024-02-24 NOTE — Assessment & Plan Note (Signed)
 Chronic No recent gout flares Check uric acid level Continue allopurinol  300 mg daily

## 2024-02-24 NOTE — Assessment & Plan Note (Addendum)
 Chronic Healthy diet encouraged Check lipid panel, CMP, TSH Continue atorvastatin  40 mg daily

## 2024-02-24 NOTE — Assessment & Plan Note (Addendum)
 Chronic Associated with chronic kidney disease stage 3a, hypertension, hyperlipidemia, obesity  Lab Results  Component Value Date   HGBA1C 5.7 (H) 08/25/2023   Sugars well controlled Check A1c Continue Rybelsus  7 mg daily Stressed diabetic diet

## 2024-02-24 NOTE — Assessment & Plan Note (Signed)
 Chronic Blood pressure well controlled CMP Continue Coreg  18.75 mg twice daily, lisinopril  40 mg daily

## 2024-02-24 NOTE — Assessment & Plan Note (Signed)
 Chronic BMI Losing weight with the help of Rybelsus -continue current dose Encouraged decrease portions, diabetic diet Limited in her ability to be active by chronic pain, but encouraged to be as active as possible

## 2024-02-24 NOTE — Assessment & Plan Note (Signed)
Chronic GERD controlled Continue omeprazole 20 mg daily  

## 2024-02-24 NOTE — Assessment & Plan Note (Signed)
Chronic Taking B12 supplementation Check B12 level

## 2024-02-24 NOTE — Assessment & Plan Note (Signed)
 Chronic No symptoms consistent with angina Continue current medication

## 2024-02-24 NOTE — Assessment & Plan Note (Signed)
 Chronic Taking vitamin D daily Check vitamin D level

## 2024-02-25 ENCOUNTER — Ambulatory Visit: Payer: Self-pay | Admitting: Internal Medicine

## 2024-03-05 ENCOUNTER — Other Ambulatory Visit (HOSPITAL_COMMUNITY): Payer: Self-pay

## 2024-03-12 ENCOUNTER — Telehealth: Payer: Self-pay

## 2024-03-12 ENCOUNTER — Other Ambulatory Visit (HOSPITAL_COMMUNITY): Payer: Self-pay

## 2024-03-12 NOTE — Telephone Encounter (Signed)
 Pharmacy Patient Advocate Encounter  Received notification from [employer] that Prior Authorization for Rybelsus  7 has been APPROVED from 03/12/24 to 03/14/25. Unable to obtain price due to refill too soon rejection, last fill date 02/13/24 next available fill date2/7/26   PA #/Case ID/Reference #: # [id number]

## 2024-03-12 NOTE — Telephone Encounter (Signed)
 Pharmacy Patient Advocate Encounter   Received notification from Onbase that prior authorization for Rybelsus  7 is required/requested.   Insurance verification completed.   The patient is insured through [employer].   Per test claim: PA required; PA submitted to above mentioned insurance via Latent Key/confirmation #/EOC [id number] Status is pending

## 2024-03-21 ENCOUNTER — Other Ambulatory Visit: Payer: Self-pay | Admitting: Internal Medicine

## 2024-04-17 ENCOUNTER — Other Ambulatory Visit: Payer: Self-pay | Admitting: Cardiology

## 2024-08-24 ENCOUNTER — Ambulatory Visit: Admitting: Internal Medicine

## 2024-12-21 ENCOUNTER — Encounter: Admitting: Internal Medicine

## 2024-12-21 ENCOUNTER — Ambulatory Visit
